# Patient Record
Sex: Female | Born: 1970 | Race: Black or African American | Hispanic: No | Smoking: Never smoker
Health system: Southern US, Community
[De-identification: ages and names within clinical notes are randomized; demographics above are authoritative.]

## PROBLEM LIST (undated history)

## (undated) DIAGNOSIS — I499 Cardiac arrhythmia, unspecified: Secondary | ICD-10-CM

## (undated) DIAGNOSIS — E876 Hypokalemia: Secondary | ICD-10-CM

## (undated) DIAGNOSIS — R6882 Decreased libido: Secondary | ICD-10-CM

## (undated) DIAGNOSIS — M722 Plantar fascial fibromatosis: Secondary | ICD-10-CM

## (undated) DIAGNOSIS — N62 Hypertrophy of breast: Secondary | ICD-10-CM

## (undated) DIAGNOSIS — I10 Essential (primary) hypertension: Secondary | ICD-10-CM

## (undated) DIAGNOSIS — I4891 Unspecified atrial fibrillation: Secondary | ICD-10-CM

## (undated) DIAGNOSIS — R7301 Impaired fasting glucose: Secondary | ICD-10-CM

## (undated) DIAGNOSIS — F419 Anxiety disorder, unspecified: Secondary | ICD-10-CM

## (undated) DIAGNOSIS — R946 Abnormal results of thyroid function studies: Secondary | ICD-10-CM

## (undated) HISTORY — PX: HAMMER TOE SURGERY: SHX385

## (undated) HISTORY — DX: Decreased libido: R68.82

## (undated) HISTORY — DX: Morbid (severe) obesity due to excess calories: E66.01

## (undated) HISTORY — DX: Hypertrophy of breast: N62

## (undated) HISTORY — DX: Abnormal results of thyroid function studies: R94.6

## (undated) HISTORY — DX: Plantar fascial fibromatosis: M72.2

## (undated) HISTORY — DX: Essential (primary) hypertension: I10

---

## 2004-10-01 ENCOUNTER — Emergency Department: Payer: Self-pay | Admitting: General Practice

## 2009-08-28 ENCOUNTER — Ambulatory Visit: Payer: Self-pay | Admitting: Podiatry

## 2009-09-01 ENCOUNTER — Ambulatory Visit: Payer: Self-pay | Admitting: Podiatry

## 2012-03-22 ENCOUNTER — Emergency Department: Payer: Self-pay | Admitting: Emergency Medicine

## 2013-12-16 HISTORY — PX: BREAST BIOPSY: SHX20

## 2014-02-21 ENCOUNTER — Ambulatory Visit: Payer: Self-pay | Admitting: Unknown Physician Specialty

## 2014-03-09 ENCOUNTER — Other Ambulatory Visit: Payer: Self-pay | Admitting: Family Medicine

## 2014-03-09 LAB — COMPREHENSIVE METABOLIC PANEL
Albumin: 3.5 g/dL (ref 3.4–5.0)
Alkaline Phosphatase: 77 U/L
Anion Gap: 4 — ABNORMAL LOW (ref 7–16)
BUN: 9 mg/dL (ref 7–18)
Bilirubin,Total: 0.3 mg/dL (ref 0.2–1.0)
CREATININE: 0.8 mg/dL (ref 0.60–1.30)
Calcium, Total: 8.3 mg/dL — ABNORMAL LOW (ref 8.5–10.1)
Chloride: 105 mmol/L (ref 98–107)
Co2: 28 mmol/L (ref 21–32)
EGFR (Non-African Amer.): >60
Glucose: 86 mg/dL (ref 65–99)
OSMOLALITY: 272 (ref 275–301)
POTASSIUM: 3.7 mmol/L (ref 3.5–5.1)
SGOT(AST): 33 U/L (ref 15–37)
SGPT (ALT): 26 U/L (ref 12–78)
Sodium: 137 mmol/L (ref 136–145)
Total Protein: 7.7 g/dL (ref 6.4–8.2)

## 2014-03-09 LAB — CBC WITH DIFFERENTIAL/PLATELET
BASOS PCT: 0.9 %
Basophil #: 0.1 10*3/uL (ref 0.0–0.1)
EOS ABS: 0.4 10*3/uL (ref 0.0–0.7)
EOS PCT: 4.6 %
HCT: 37.5 % (ref 35.0–47.0)
HGB: 12.7 g/dL (ref 12.0–16.0)
LYMPHS PCT: 43 %
Lymphocyte #: 3.3 10*3/uL (ref 1.0–3.6)
MCH: 32.6 pg (ref 26.0–34.0)
MCHC: 33.7 g/dL (ref 32.0–36.0)
MCV: 97 fL (ref 80–100)
Monocyte #: 0.5 x10 3/mm (ref 0.2–0.9)
Monocyte %: 5.8 %
Neutrophil #: 3.5 10*3/uL (ref 1.4–6.5)
Neutrophil %: 45.7 %
PLATELETS: 298 10*3/uL (ref 150–440)
RBC: 3.89 10*6/uL (ref 3.80–5.20)
RDW: 12.7 % (ref 11.5–14.5)
WBC: 7.7 10*3/uL (ref 3.6–11.0)

## 2014-03-09 LAB — HEMOGLOBIN A1C: Hemoglobin A1C: 5.7 % (ref 4.2–6.3)

## 2014-03-09 LAB — LIPID PANEL
Cholesterol: 165 mg/dL (ref 0–200)
HDL: 60 mg/dL (ref 40–60)
LDL CHOLESTEROL, CALC: 86 mg/dL (ref 0–100)
Triglycerides: 96 mg/dL (ref 0–200)
VLDL CHOLESTEROL, CALC: 19 mg/dL (ref 5–40)

## 2014-03-30 ENCOUNTER — Ambulatory Visit (INDEPENDENT_AMBULATORY_CARE_PROVIDER_SITE_OTHER): Payer: No Typology Code available for payment source | Admitting: Podiatry

## 2014-03-30 ENCOUNTER — Encounter: Payer: Self-pay | Admitting: Podiatry

## 2014-03-30 VITALS — BP 125/82 | HR 63 | Resp 16 | Ht 65.0 in | Wt 230.0 lb

## 2014-03-30 DIAGNOSIS — Q828 Other specified congenital malformations of skin: Secondary | ICD-10-CM

## 2014-03-30 NOTE — Progress Notes (Signed)
   Subjective:    Patient ID: Lori Oliver, female    DOB: 02/01/1971, 43 y.o.   MRN: [id number]  HPI Comments: SPOT ON THE BOTTOM OF MY RIGHT FOOT. ITS BEEN THERE A COUPLE OF MONTHS. TRIED USING A RAZOR ON IT TO DIG IT OUT     Review of Systems  All other systems reviewed and are negative.      Objective:   Physical Exam: I have reviewed her past medical history medications allergies surgeries social history and review of systems. Pulses are palpable bilateral. Neurologic sensorium is intact. Deep tendon reflexes are intact. Muscle strength was 5 over 5 dorsiflexors plantar flexors inverters everters all intrinsic musculature is intact. Orthopedic evaluation demonstrates mild pes planus bilateral. Mild hammertoe deformities bilateral. Cutaneous evaluation does demonstrates supple well hydrated cutis with exception of a solitary porokeratotic lesion sub-fifth metatarsal head of the right foot.        Assessment & Plan:  Assessment: Porokeratosis right foot.  Plan:

## 2014-08-30 ENCOUNTER — Ambulatory Visit: Payer: Self-pay | Admitting: Unknown Physician Specialty

## 2014-09-14 ENCOUNTER — Encounter: Payer: Self-pay | Admitting: General Surgery

## 2014-09-14 ENCOUNTER — Ambulatory Visit (INDEPENDENT_AMBULATORY_CARE_PROVIDER_SITE_OTHER): Payer: No Typology Code available for payment source | Admitting: General Surgery

## 2014-09-14 VITALS — BP 128/72 | HR 76 | Resp 12 | Ht 65.0 in | Wt 239.0 lb

## 2014-09-14 DIAGNOSIS — R928 Other abnormal and inconclusive findings on diagnostic imaging of breast: Secondary | ICD-10-CM | POA: Insufficient documentation

## 2014-09-14 NOTE — Patient Instructions (Signed)
Patient has been scheduled for a right breast stereotactic biopsy at [facility] for 09-26-14 at 2 pm. She will check-in at the [facility] at 1:30 pm. This patient is aware of date, time, and instructions. Patient verbalizes understanding.

## 2014-09-14 NOTE — Progress Notes (Signed)
Patient ID: Lori Oliver, female   DOB: [birth year], 43 y.o.   MRN: [id number]  Chief Complaint  Patient presents with  . Other    mammagram     HPI Lori Oliver is a 43 y.o. female who presents for a breast evaluation. The most recent mammogram was done on 08/30/14. Patient does perform regular self breast checks and gets regular mammograms done.  The patient denies any problems with the breasts at this time. No family history of breast cancer. The patient has had 1 prior mammogram done in her thirties for breast tenderness. By report, these films were completed at the hospital but have not been able to be located.  The patient had her first screening exam in spring of this year at which time a small nodular density was noted in the right breast. Focal spot compression views showed a 5 mm area with no corresponding ultrasound abnormality. This was felt to be a low suspicion lesion for malignancy and observation warranted. She recently completed her 6 month followup mammogram, with no interval change identified. She is asked extremely anxious about this watchful waiting, and presented to discuss elective biopsy.  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   HPI  Past Medical History  Diagnosis Date  . Hypertension     Past Surgical History  Procedure Laterality Date  . Hammer toe surgery      No family history on file.  Social History History  Substance Use Topics  . Smoking status: Never Smoker   . Smokeless tobacco: Never Used  . Alcohol Use: Yes    No Known Allergies  Current Outpatient Prescriptions  Medication Sig Dispense Refill  . hydrochlorothiazide (MICROZIDE) 12.5 MG capsule       . Norgestim-Eth Estrad Triphasic (ORTHO TRI-CYCLEN LO PO) Take by mouth.       No current facility-administered medications for this visit.    Review of Systems Review of Systems  Constitutional: Negative.   Respiratory: Negative.   Cardiovascular: Negative.     Blood  pressure 128/72, pulse 76, resp. rate 12, height 5\' 5"  (1.651 m), weight 239 lb (108.41 kg), last menstrual period 09/14/2014.  Physical Exam Physical Exam  Constitutional: She is oriented to person, place, and time. She appears well-developed and well-nourished.  Neck: Neck supple. No thyromegaly present.  Cardiovascular: Normal rate, regular rhythm and normal heart sounds.   No murmur heard. Pulmonary/Chest: Effort normal and breath sounds normal. Right breast exhibits no inverted nipple, no mass, no nipple discharge, no skin change and no tenderness. Left breast exhibits no inverted nipple, no mass, no nipple discharge, no skin change and no tenderness.  The patient has large pendulous breasts. No palpable abnormality.  Lymphadenopathy:    She has no cervical adenopathy.    She has no axillary adenopathy.  Neurological: She is alert and oriented to person, place, and time.  Skin: Skin is warm and dry.    Data Reviewed Original screening films of February 09, 2049 were reviewed. An inferior medial right breast lesion was appreciated. Small left breast lymph node noted.  Focal spot compression views of February 21, 2014 confirm the above findings. Ultrasound at that time was reported as negative.  Follow up films of August 30, 2014 were reviewed. No interval change. BI-RAD-3.  Assessment    5 mm nodule in a very large breasts.     Plan    The area is well defined and unchanged over 6 months. The patient finds "  watchful waiting" very stressful.  I reviewed with her that the area is supple, and a very large breast may be difficult to localize with stereotactic technique. If this is not possible, biopsy would require needle localization and open surgical intervention at a later date.  The risks associated with biopsy were reviewed. At this time she desires to proceed.  Patient has been scheduled for a right breast stereotactic biopsy at [facility] for 09-26-14 at 2 pm. She will check-in  at the [facility] at 1:30 pm. This patient is aware of date, time, and instructions. Patient verbalizes understanding.     PCP: Bobetta Lime Ref. MD: Dr. Percell Boston, Forest Gleason 09/14/2014, 8:33 PM

## 2014-09-26 ENCOUNTER — Ambulatory Visit: Payer: Self-pay | Admitting: General Surgery

## 2014-09-26 DIAGNOSIS — N6459 Other signs and symptoms in breast: Secondary | ICD-10-CM

## 2014-09-28 ENCOUNTER — Telehealth: Payer: Self-pay | Admitting: *Deleted

## 2014-09-28 ENCOUNTER — Encounter: Payer: Self-pay | Admitting: General Surgery

## 2014-09-28 LAB — PATHOLOGY REPORT

## 2014-09-28 NOTE — Telephone Encounter (Signed)
Notified patient as instructed, patient pleased. Discussed follow-up appointments, patient agrees. Placed in recalls.  

## 2014-09-28 NOTE — Telephone Encounter (Signed)
Message copied by Carson Myrtle on Wed Sep 28, 2014 10:06 AM ------      Message from: [place], Forest Gleason      Created: Wed Sep 28, 2014  8:33 AM       Please notify the patient that her biopsy results were entirely benign. She had a fibroadenoma, cluster of normal breast cells. Followup bilateral mammograms in 6 months with a brief office exam would be appropriate. Nursing follow up as previously scheduled. Thank you ------

## 2014-10-03 ENCOUNTER — Ambulatory Visit (INDEPENDENT_AMBULATORY_CARE_PROVIDER_SITE_OTHER): Payer: Self-pay | Admitting: *Deleted

## 2014-10-03 DIAGNOSIS — R928 Other abnormal and inconclusive findings on diagnostic imaging of breast: Secondary | ICD-10-CM

## 2014-10-03 NOTE — Progress Notes (Signed)
Patient here today for follow up post right breast biopsy.  .  The patient is aware that a heating pad may be used for comfort as needed.  Aware of pathology. Follow up as scheduled. Dr. Bary Castilla saw the the patient to go over the results and took a look at her right breast due to a hard spot that the patient noticed.

## 2014-10-17 ENCOUNTER — Encounter: Payer: Self-pay | Admitting: General Surgery

## 2014-10-31 ENCOUNTER — Encounter: Payer: Self-pay | Admitting: General Surgery

## 2014-11-21 ENCOUNTER — Ambulatory Visit (INDEPENDENT_AMBULATORY_CARE_PROVIDER_SITE_OTHER): Payer: No Typology Code available for payment source | Admitting: Podiatry

## 2014-11-21 VITALS — BP 141/92 | HR 74 | Resp 16

## 2014-11-21 DIAGNOSIS — B079 Viral wart, unspecified: Secondary | ICD-10-CM

## 2014-11-21 NOTE — Progress Notes (Signed)
She presents today with painful lesion plantar aspect of the right fifth metatarsal neck area. She states this is becoming increasingly more painful over the past several months.  Objective: Vital signs are stable she is alert and oriented 3. Upon evaluation today is noted that poor keratoma that I thought was present last time has definitely become a verruca plantaris.  Assessment: Verruca plantaris plantar aspect of the right forefoot.  Plan: We had performed a surgical curettage today after 2 mL of admission Marcaine plain lidocaine with epinephrine was injected sublesionally. The area was prepped and the lesion was surgically excised. She tolerated this procedure well and will follow up with me.

## 2014-11-21 NOTE — Patient Instructions (Signed)

## 2014-11-22 ENCOUNTER — Telehealth: Payer: Self-pay | Admitting: *Deleted

## 2014-11-22 NOTE — Telephone Encounter (Signed)
Pt called and wanted to know if she could exercise. Stated she had a wart taken off yesterday and her foot feels fine. Told pt as long as her foot felt ok, it was ok to exercise. Pt understood.

## 2014-11-28 ENCOUNTER — Encounter: Payer: Self-pay | Admitting: Podiatry

## 2014-12-07 ENCOUNTER — Ambulatory Visit (INDEPENDENT_AMBULATORY_CARE_PROVIDER_SITE_OTHER): Payer: No Typology Code available for payment source | Admitting: Podiatry

## 2014-12-07 ENCOUNTER — Encounter: Payer: Self-pay | Admitting: Podiatry

## 2014-12-07 DIAGNOSIS — Q828 Other specified congenital malformations of skin: Secondary | ICD-10-CM

## 2014-12-07 NOTE — Progress Notes (Signed)
She presents today for follow-up of her excision soft tissue lesion plantar aspect of the right foot. She states that it feels much better than previous but she has stopped soaking.  Objective: Vital signs are stable she is alert and oriented 3. Mild tenderness on palpation of the lesion to the plantar aspect of the fifth metatarsal head of the right foot that appears to be granulating in with epithelialization. There is small central hole that appears to have some serosanguineous drainage from it does not appear to be not nor does it have malodor. Pathology report states that this is a porokeratosis.  Assessment: Well-healing excision soft tissue lesion plantar aspect right foot.  Plan: Discontinue Betadine soaks start with Epsom salts and water soaks twice daily cover with a Band-Aid and the daily leave open at night. I will follow-up with her as needed.

## 2015-04-05 ENCOUNTER — Ambulatory Visit
Admit: 2015-04-05 | Disposition: A | Discharge status: Home / Self Care | Payer: Self-pay | Attending: General Surgery | Admitting: General Surgery

## 2015-04-10 ENCOUNTER — Ambulatory Visit: Payer: No Typology Code available for payment source | Admitting: General Surgery

## 2015-05-17 ENCOUNTER — Encounter: Payer: Self-pay | Admitting: *Deleted

## 2015-06-01 ENCOUNTER — Encounter (INDEPENDENT_AMBULATORY_CARE_PROVIDER_SITE_OTHER): Payer: Self-pay

## 2015-06-01 ENCOUNTER — Ambulatory Visit (INDEPENDENT_AMBULATORY_CARE_PROVIDER_SITE_OTHER): Payer: Managed Care, Other (non HMO) | Admitting: Family Medicine

## 2015-06-01 ENCOUNTER — Encounter: Payer: Self-pay | Admitting: Family Medicine

## 2015-06-01 VITALS — BP 130/88 | HR 91 | Temp 98.4°F | Resp 18 | Ht 65.0 in | Wt 237.2 lb

## 2015-06-01 DIAGNOSIS — M546 Pain in thoracic spine: Secondary | ICD-10-CM | POA: Insufficient documentation

## 2015-06-01 DIAGNOSIS — Z8742 Personal history of other diseases of the female genital tract: Secondary | ICD-10-CM | POA: Insufficient documentation

## 2015-06-01 DIAGNOSIS — E669 Obesity, unspecified: Secondary | ICD-10-CM | POA: Diagnosis not present

## 2015-06-01 DIAGNOSIS — R030 Elevated blood-pressure reading, without diagnosis of hypertension: Secondary | ICD-10-CM | POA: Insufficient documentation

## 2015-06-01 DIAGNOSIS — Z87898 Personal history of other specified conditions: Secondary | ICD-10-CM | POA: Insufficient documentation

## 2015-06-01 HISTORY — DX: Morbid (severe) obesity due to excess calories: E66.01

## 2015-06-01 MED ORDER — PHENTERMINE HCL 37.5 MG PO CAPS: 37.5000 mg | ORAL_CAPSULE | ORAL | Status: DC

## 2015-06-01 MED ORDER — PHENTERMINE HCL 37.5 MG PO CAPS: 37.5000 mg | ORAL_CAPSULE | Freq: Every day | ORAL | Status: DC

## 2015-06-01 NOTE — Progress Notes (Signed)
Name: Lori Oliver   MRN: [id number]    DOB: [birth year]   Date:06/01/2015       Progress Note  Subjective  Chief Complaint  Chief Complaint  Patient presents with  . Obesity    1 month follow up  . Results    review of lab results    HPI  Patient is here for routine follow up of Obesity. The patient is appropriately concerned about their weight. Onset of weight issues started several years ago. Highest recorded weight 242 lbs. Associated symptoms or diseases do not include pain in weight bearing joints other than lumbar spine, HTN, HLD, DMII, skin lesions, depression, poor self esteem, snoring, sleep apnea. Current efforts to reduce weight include diet exercise and Adipex. Describe current exercise regimen: daily, walking 3-42miles Describe current diet regimen: balanced diet     Patient Active Problem List   Diagnosis Date Noted  . Obesity, Class II, BMI 35-39.9 06/01/2015  . Bilateral thoracic back pain 06/01/2015  . Blood pressure elevated without history of HTN 06/01/2015  . History of breast lump 06/01/2015  . History of abnormal cervical Pap smear 06/01/2015  . Abnormal mammogram 09/14/2014    History  Substance Use Topics  . Smoking status: Never Smoker   . Smokeless tobacco: Never Used  . Alcohol Use: No     Current outpatient prescriptions:  .  Norgestim-Eth Estrad Triphasic (ORTHO TRI-CYCLEN LO PO), Take by mouth., Disp: , Rfl:  .  phentermine 37.5 MG capsule, Take 1 capsule by mouth daily., Disp: , Rfl:   Past Surgical History  Procedure Laterality Date  . Hammer toe surgery      Family History  Problem Relation Age of Onset  . Hypertension Mother   . Heart disease Mother   . Arthritis Mother   . Depression Mother   . Stroke Maternal Grandmother     No Known Allergies   Review of Systems  Ten systems reviewed and is negative except as mentioned in HPI.    Objective  BP 130/88 mmHg  Pulse 91  Temp(Src) 98.4 F (36.9 C) (Oral)  Resp  18  Ht 5\' 5"  (1.651 m)  Wt 237 lb 3.2 oz (107.593 kg)  BMI 39.47 kg/m2  SpO2 96%  LMP 05/24/2015  Physical Exam  Constitutional: Patient is obese and well-nourished. In no distress.  HEENT:  - Head: Normocephalic and atraumatic.  - Ears: Bilateral TMs gray, no erythema or effusion - Nose: Nasal mucosa moist - Mouth/Throat: Oropharynx is clear and moist. No tonsillar hypertrophy or erythema. No post nasal drainage.  - Eyes: Conjunctivae clear, EOM movements normal. PERRLA. No scleral icterus.  Neck: Normal range of motion. Neck supple. No JVD present. No thyromegaly present.  Cardiovascular: Normal rate, regular rhythm and normal heart sounds.  No murmur heard.  Pulmonary/Chest: Effort normal and breath sounds normal. No respiratory distress. Musculoskeletal: Normal range of motion bilateral UE and LE, no joint effusions. Peripheral vascular: Bilateral LE no edema. Neurological: CN II-XII grossly intact with no focal deficits. Alert and oriented to person, place, and time. Coordination, balance, strength, speech and gait are normal.  Skin: Skin is warm and dry. No rash noted. No erythema.  Psychiatric: Patient has a normal mood and affect. Behavior is normal in office today. Judgment and thought content normal in office today.   Assessment & Plan  1. Obesity, Class II, BMI 35-39.9 Reviewed lab work with patient today, no significant findings (ordered in [employer] EMR in 04/2014 and results were  faxed, hard copy will be scanned into [employer] EMR).  The patient has been counseled on their higher than normal BMI.  They have verbally expressed understanding their increased risk for other diseases.  In efforts to meet a better target BMI goal the patient has been counseled on lifestyle, diet and exercise modification tactics. Start with moderate intensity aerobic exercise (walking, jogging, elliptical, swimming, group or individual sports, hiking) at least 17mins a day at least 4 days a week and  increase intensity, duration, frequency as tolerated. Diet should include well balance fresh fruits and vegetables avoiding processed foods, carbohydrates and sugars. Drink at least 8oz 10 glasses a day avoiding sodas, sugary fruit drinks, sweetened tea. Check weight on a reliable scale daily and monitor weight loss progress daily. Consider investing in mobile phone apps that will help keep track of weight loss goals.  - phentermine 37.5 MG capsule; Take 1 capsule (37.5 mg total) by mouth daily.  Dispense: 30 capsule; Refill: 0 - phentermine 37.5 MG capsule; Take 1 capsule (37.5 mg total) by mouth every morning.  Dispense: 30 capsule; Refill: 0 - phentermine 37.5 MG capsule; Take 1 capsule (37.5 mg total) by mouth daily.  Dispense: 30 capsule; Refill: 0

## 2015-06-01 NOTE — Patient Instructions (Signed)
Exercise to Lose Weight Exercise and a healthy diet may help you lose weight. Your doctor may suggest specific exercises. EXERCISE IDEAS AND TIPS  Choose low-cost things you enjoy doing, such as walking, bicycling, or exercising to workout videos.  Take stairs instead of the elevator.  Walk during your lunch break.  Park your car further away from work or school.  Go to a gym or an exercise class.  Start with 5 to 10 minutes of exercise each day. Build up to 30 minutes of exercise 4 to 6 days a week.  Wear shoes with good support and comfortable clothes.  Stretch before and after working out.  Work out until you breathe harder and your heart beats faster.  Drink extra water when you exercise.  Do not do so much that you hurt yourself, feel dizzy, or get very short of breath. Exercises that burn about 150 calories:  Running 1  miles in 15 minutes.  Playing volleyball for 45 to 60 minutes.  Washing and waxing a car for 45 to 60 minutes.  Playing touch football for 45 minutes.  Walking 1  miles in 35 minutes.  Pushing a stroller 1  miles in 30 minutes.  Playing basketball for 30 minutes.  Raking leaves for 30 minutes.  Bicycling 5 miles in 30 minutes.  Walking 2 miles in 30 minutes.  Dancing for 30 minutes.  Shoveling snow for 15 minutes.  Swimming laps for 20 minutes.  Walking up stairs for 15 minutes.  Bicycling 4 miles in 15 minutes.  Gardening for 30 to 45 minutes.  Jumping rope for 15 minutes.  Washing windows or floors for 45 to 60 minutes. Document Released: 01/04/2011 Document Revised: 02/24/2012 Document Reviewed: 01/04/2011 Exit[employer] Patient Information 2015 [employer]. This information is not intended to replace advice given to you by your health care provider. Make sure you discuss any questions you have with your health care provider.  

## 2015-06-28 ENCOUNTER — Encounter: Payer: Self-pay | Admitting: Family Medicine

## 2015-09-01 ENCOUNTER — Ambulatory Visit: Payer: Managed Care, Other (non HMO) | Admitting: Family Medicine

## 2016-02-05 ENCOUNTER — Ambulatory Visit: Payer: Managed Care, Other (non HMO) | Admitting: Family Medicine

## 2016-02-05 ENCOUNTER — Other Ambulatory Visit: Payer: Self-pay

## 2016-02-05 MED ORDER — NORGESTIM-ETH ESTRAD TRIPHASIC 0.18/0.215/0.25 MG-25 MCG PO TABS: 1.0000 | ORAL_TABLET | Freq: Every day | ORAL | Status: DC

## 2016-02-05 NOTE — Telephone Encounter (Signed)
Had an appointment for today but had to reschedule

## 2016-02-12 ENCOUNTER — Ambulatory Visit (INDEPENDENT_AMBULATORY_CARE_PROVIDER_SITE_OTHER): Payer: Managed Care, Other (non HMO) | Admitting: Family Medicine

## 2016-02-12 ENCOUNTER — Encounter: Payer: Self-pay | Admitting: Family Medicine

## 2016-02-12 VITALS — BP 138/80 | HR 81 | Temp 98.2°F | Resp 20 | Ht 65.0 in | Wt 238.4 lb

## 2016-02-12 DIAGNOSIS — Z3041 Encounter for surveillance of contraceptive pills: Secondary | ICD-10-CM | POA: Diagnosis not present

## 2016-02-12 DIAGNOSIS — E669 Obesity, unspecified: Secondary | ICD-10-CM | POA: Diagnosis not present

## 2016-02-12 MED ORDER — CONTRAVE 8-90 MG PO TB12: 2.0000 | ORAL_TABLET | Freq: Two times a day (BID) | ORAL | Status: DC

## 2016-02-12 NOTE — Progress Notes (Signed)
Name: Lori Oliver   MRN: [id number]    DOB: 1971-04-24   Date:02/12/2016       Progress Note  Subjective  Chief Complaint  Chief Complaint  Patient presents with  . Medication Refill    contraceptive   . Obesity    HPI  Lori Oliver is a 45 year old female here for refills of OCPs and discuss her obesity.   She tried Adipex in 2016 and initially she had good apetite suppression and weight loss with exercise and lifestyle changes. But then she started working 2 jobs and now she has no time to exercise and eat right so she has put on the weight again.  The patient is appropriately concerned about their weight. Onset of weight issues started several years ago. Highest recorded weight 242 lbs. Today 238lbs. Associated symptoms or diseases do not include pain in weight bearing joints other than lumbar spine, HTN, HLD, DMII, skin lesions, snoring, sleep apnea. She does report low moods and poor self esteem since regaining her weight. Current efforts to reduce weight include none.    She did consider bariatric surgery but did not want complications or nutritional deficits.   Past Medical History  Diagnosis Date  . Hypertension     Patient Active Problem List   Diagnosis Date Noted  . Obesity, Class II, BMI 35-39.9 06/01/2015  . Bilateral thoracic back pain 06/01/2015  . Blood pressure elevated without history of HTN 06/01/2015  . History of breast lump 06/01/2015  . History of abnormal cervical Pap smear 06/01/2015    Social History  Substance Use Topics  . Smoking status: Never Smoker   . Smokeless tobacco: Never Used  . Alcohol Use: No     Current outpatient prescriptions:  .  Norgestimate-Ethinyl Estradiol Triphasic (ORTHO TRI-CYCLEN LO) 0.18/0.215/0.25 MG-25 MCG tab, Take 1 tablet by mouth daily., Disp: 1 Package, Rfl: 5 .  phentermine 37.5 MG capsule, Take 1 capsule (37.5 mg total) by mouth every morning., Disp: 30 capsule, Rfl: 0 .  phentermine 37.5 MG capsule,  Take 1 capsule (37.5 mg total) by mouth daily., Disp: 30 capsule, Rfl: 0 .  SPRINTEC 28 0.25-35 MG-MCG tablet, , Disp: , Rfl:   Past Surgical History  Procedure Laterality Date  . Hammer toe surgery      Family History  Problem Relation Age of Onset  . Hypertension Mother   . Heart disease Mother   . Arthritis Mother   . Depression Mother   . Stroke Maternal Grandmother     No Known Allergies   Review of Systems  CONSTITUTIONAL: Increased weight gain. No fever, chills, weakness or fatigue.  CARDIOVASCULAR: No chest pain, chest pressure or chest discomfort. No palpitations or edema.  RESPIRATORY: No shortness of breath, cough or sputum.  NEUROLOGICAL: No headache, dizziness, syncope, paralysis, ataxia, numbness or tingling in the extremities. No memory changes. No change in bowel or bladder control.  MUSCULOSKELETAL: No joint pain. No muscle pain. HEMATOLOGIC: No anemia, bleeding or bruising.  LYMPHATICS: No enlarged lymph nodes.  PSYCHIATRIC: No change in mood. No change in sleep pattern.  ENDOCRINOLOGIC: No reports of sweating, cold or heat intolerance. No polyuria or polydipsia.     Objective  BP 138/80 mmHg  Pulse 81  Temp(Src) 98.2 F (36.8 C)  Resp 20  Ht 5\' 5"  (1.651 m)  Wt 238 lb 7 oz (108.155 kg)  BMI 39.68 kg/m2  SpO2 97% Body mass index is 39.68 kg/(m^2).  Physical Exam  Constitutional: Patient  is obese and well-nourished. In no distress.  Neck: Normal range of motion. Neck supple. No JVD present. No thyromegaly present.  Cardiovascular: Normal rate, regular rhythm and normal heart sounds.  No murmur heard.  Pulmonary/Chest: Effort normal and breath sounds normal. No respiratory distress. Musculoskeletal: Normal range of motion bilateral UE and LE, no joint effusions. Peripheral vascular: Bilateral LE no edema. Neurological: CN II-XII grossly intact with no focal deficits. Alert and oriented to person, place, and time. Coordination, balance, strength,  speech and gait are normal.  Skin: Skin is warm and dry. No rash noted. No erythema.  Psychiatric: Patient has a stable mood and affect. Behavior is normal in office today. Judgment and thought content normal in office today.   Assessment & Plan  1. Surveillance for birth control, oral contraceptives Refilled earlier this month  2. Obesity, Class II, BMI 35-39.9 The patient has been counseled on their higher than normal BMI.  They have verbally expressed understanding their increased risk for other diseases.  In efforts to meet a better target BMI goal the patient has been counseled on lifestyle, diet and exercise modification tactics. Start with moderate intensity aerobic exercise (walking, jogging, elliptical, swimming, group or individual sports, hiking) at least 3mins a day at least 4 days a week and increase intensity, duration, frequency as tolerated. Diet should include well balance fresh fruits and vegetables avoiding processed foods, carbohydrates and sugars. Drink at least 8oz 10 glasses a day avoiding sodas, sugary fruit drinks, sweetened tea. Check weight on a reliable scale daily and monitor weight loss progress daily. Consider investing in mobile phone apps that will help keep track of weight loss goals.  - CONTRAVE 8-90 MG TB12; Take 2 tablets by mouth 2 (two) times daily.  Dispense: 60 tablet; Refill: 2

## 2016-02-13 ENCOUNTER — Telehealth: Payer: Self-pay | Admitting: Family Medicine

## 2016-02-13 NOTE — Telephone Encounter (Signed)
Called patient told her to contact her pharmacy or insurance company about cheaper weight loss medicine.

## 2016-02-13 NOTE — Telephone Encounter (Signed)
Patient states Contrave will cost $118.00 (to expensive), would like to know if there is something different that you could prescribe that is less expensive. I asked her to call insurance company to find out what they will cover and patient stated that they told her to contact her primary doctor.

## 2016-02-20 ENCOUNTER — Telehealth: Payer: Self-pay

## 2016-02-20 NOTE — Telephone Encounter (Signed)
Unfortunately she did not lose enough weight on Phentermine and chronic use of this medication on and off is not recommend side effects outweigh benefits.

## 2016-02-20 NOTE — Telephone Encounter (Signed)
Patient called states  Her ins. Will not cover Contrave and is to expensive.  Wants to see if you will put her back on phentermine?

## 2016-04-01 ENCOUNTER — Other Ambulatory Visit: Payer: Self-pay

## 2016-04-01 NOTE — Telephone Encounter (Signed)
I have never seen this patient; chart reviewed Please ask patient to contact her gynecologist for this; I see hx of abnormal pap smear and I do not see the pathology report (I looked pretty extensively in media and lab tabs, but maybe I missed it) Also, she is over 40 and has phentermine listed in her med list, so I'm not comfortable prescribing this for her Please have her contact gyn for birth control medicine or other options

## 2016-04-02 MED ORDER — SPRINTEC 28 0.25-35 MG-MCG PO TABS: 1.0000 | ORAL_TABLET | Freq: Every day | ORAL | Status: DC

## 2016-04-02 MED ORDER — NORGESTIM-ETH ESTRAD TRIPHASIC 0.18/0.215/0.25 MG-25 MCG PO TABS
1.0000 | ORAL_TABLET | Freq: Every day | ORAL | Status: DC
Start: 2016-04-02 — End: 2016-04-02

## 2016-04-02 NOTE — Telephone Encounter (Signed)
Patient states she has a physical here on May 18th and states that Dr. Rod Holler does her paps and gives her birth control. Patient states that she is out of birth control right now.

## 2016-04-02 NOTE — Telephone Encounter (Signed)
Dr. lada talked with patient and is refilling sprintec.  Will see her in May for CPE

## 2016-04-02 NOTE — Telephone Encounter (Signed)
I talked with patient Apologized for any confusion, but we had no record of pap smear done here ever; staff found an abnormal pap smear from February 25, 2013 that showed NIL but HPV positive Patient avers that she had another one since then and that it was normal She denies being on any diet pills right now; she does not smoke; I reminded her of low but possible risk of heart attack or stroke on OCP; consider other options; we'll discuss at CPE in May Two months of Rx approved to last until I see her I sent Rx; saw that she had two OCPs in med list; staff called to clarify correct one and I sent that 2nd and marked for 1st OCP to be cancelled --------------------- Lori Oliver -- we have the pap smear from February 25, 2013, but we need the more recent pap smear for her chart; thank you

## 2016-05-02 ENCOUNTER — Telehealth: Payer: Self-pay | Admitting: Family Medicine

## 2016-05-02 NOTE — Telephone Encounter (Signed)
PT HAS A PHYSICAL APPT WITH YOU ON May 16 2016. tHIS IS THE FIRST AVAILABLE CPE SLOT.  PT IS NEEDING HER BIRTHCONTROL REFILLED.  [place]

## 2016-05-02 NOTE — Telephone Encounter (Signed)
I see abnormal pap smear in her hx; I do not see her last pap report (path); can you please find that first? Also, I need to know if she's taking any diet pills like phentermine; I see that in her past med list

## 2016-05-03 NOTE — Telephone Encounter (Signed)
I tried to call patient back and she was busy, she states would call back

## 2016-05-10 NOTE — Telephone Encounter (Signed)
ERRENOUS °

## 2016-05-16 ENCOUNTER — Encounter: Payer: Self-pay | Admitting: Family Medicine

## 2016-05-16 ENCOUNTER — Telehealth: Payer: Self-pay

## 2016-05-16 ENCOUNTER — Ambulatory Visit (INDEPENDENT_AMBULATORY_CARE_PROVIDER_SITE_OTHER): Payer: Managed Care, Other (non HMO) | Admitting: Family Medicine

## 2016-05-16 VITALS — BP 132/84 | HR 85 | Temp 97.5°F | Resp 16 | Wt 242.0 lb

## 2016-05-16 DIAGNOSIS — Z1239 Encounter for other screening for malignant neoplasm of breast: Secondary | ICD-10-CM | POA: Insufficient documentation

## 2016-05-16 DIAGNOSIS — R946 Abnormal results of thyroid function studies: Secondary | ICD-10-CM

## 2016-05-16 DIAGNOSIS — Z309 Encounter for contraceptive management, unspecified: Secondary | ICD-10-CM | POA: Insufficient documentation

## 2016-05-16 DIAGNOSIS — Z8742 Personal history of other diseases of the female genital tract: Secondary | ICD-10-CM | POA: Diagnosis not present

## 2016-05-16 DIAGNOSIS — R6882 Decreased libido: Secondary | ICD-10-CM

## 2016-05-16 DIAGNOSIS — Z124 Encounter for screening for malignant neoplasm of cervix: Secondary | ICD-10-CM | POA: Diagnosis not present

## 2016-05-16 DIAGNOSIS — N62 Hypertrophy of breast: Secondary | ICD-10-CM | POA: Diagnosis not present

## 2016-05-16 DIAGNOSIS — R5383 Other fatigue: Secondary | ICD-10-CM

## 2016-05-16 DIAGNOSIS — Z0000 Encounter for general adult medical examination without abnormal findings: Secondary | ICD-10-CM | POA: Diagnosis not present

## 2016-05-16 DIAGNOSIS — N63 Unspecified lump in breast: Secondary | ICD-10-CM | POA: Diagnosis not present

## 2016-05-16 DIAGNOSIS — R7301 Impaired fasting glucose: Secondary | ICD-10-CM | POA: Diagnosis not present

## 2016-05-16 DIAGNOSIS — Z3041 Encounter for surveillance of contraceptive pills: Secondary | ICD-10-CM | POA: Diagnosis not present

## 2016-05-16 DIAGNOSIS — N6323 Unspecified lump in the left breast, lower outer quadrant: Secondary | ICD-10-CM

## 2016-05-16 HISTORY — DX: Decreased libido: R68.82

## 2016-05-16 HISTORY — DX: Hypertrophy of breast: N62

## 2016-05-16 HISTORY — DX: Abnormal results of thyroid function studies: R94.6

## 2016-05-16 NOTE — Telephone Encounter (Signed)
Thank you :)

## 2016-05-16 NOTE — Telephone Encounter (Signed)
Pt states yall talked about birth control.  She wants to get the birth control in the arm please make referral to [facility]: Su Hilt

## 2016-05-16 NOTE — Telephone Encounter (Signed)
Also place order for diagnostic mammo and left and right u/s

## 2016-05-16 NOTE — Patient Instructions (Addendum)
Please do call to schedule your mammogram; the number to schedule one at either [facility] or [facility] is [phone]  I've put in a referral to see a surgeon  Return for fasting labs to [place] out the information at [email or web address] entitled "Nutrition for Weight Loss: What You Need to Know about Fad Diets" Try to lose between 1-2 pounds per week by taking in fewer calories and burning off more calories You can succeed by limiting portions, limiting foods dense in calories and fat, becoming more active, and drinking 8 glasses of water a day (64 ounces) Don't skip meals, especially breakfast, as skipping meals may alter your metabolism Do not use over-the-counter weight loss pills or gimmicks that claim rapid weight loss A healthy BMI (or body mass index) is between 18.5 and 24.9 You can calculate your ideal BMI at the [place] website ClubMonetize.fr  Return for dedicated weight loss medicine vs surgery visit if desired  Bariatric Surgery Information Bariatric surgery, also called weight loss surgery, is a procedure that helps you lose weight. You may consider or your health care provider may suggest bariatric surgery if:  You are severely obese and have been unable to lose weight through diet and exercise.  You have health problems related to obesity, such as:  Type 2 diabetes.  Heart disease.  Lung disease. HOW DOES BARIATRIC SURGERY HELP ME LOSE WEIGHT?  Bariatric surgery helps you lose weight by decreasing how much food your body absorbs. This is done by closing off part of your stomach to make it smaller. This restricts the amount of food your stomach can hold. Bariatric surgery can also change your body's regular digestive process, so that food bypasses the parts of your body that absorb calories and nutrients.  If you decide to have bariatric surgery, it is important to continue to eat a  healthy diet and exercise regularly after the surgery.  WHAT ARE THE DIFFERENT KINDS OF BARIATRIC SURGERY?  There are two kinds of bariatric surgeries:  Restrictive surgeries make your stomach smaller. They do not change your digestive process. The smaller the size of your new stomach, the less food you can eat. There are different types of restrictive surgeries.  Malabsorptive surgeries both make your stomach smaller and alter your digestive process so that your body processes less calories and nutrients. These are the most common kind of bariatric surgery. There are different types of malabsorptive surgeries. WHAT ARE THE DIFFERENT TYPES OF RESTRICTIVE SURGERY? Adjustable Gastric Banding In this procedure, an inflatable band is placed around your stomach near the upper end. This makes the passageway for food into the rest of your stomach much smaller. The band can be adjusted, making it tighter or looser, by filling it with salt solution. Your surgeon can adjust the band based on how are you feeling and how much weight you are losing. The band can be removed in the future.  Vertical Banded Gastroplasty In this procedure, staples are used to separate your stomach into two parts, a small upper pouch and a bigger lower pouch. This decreases how much food you can eat. Sleeve Gastrectomy In this procedure, your stomach is made smaller. This is done by surgically removing a large part of your stomach. When your stomach is smaller, you feel full more quickly and reduce how much you eat. WHAT ARE THE DIFFERENT TYPES OF MALABSORPTIVE SURGERY? Roux-en-Y Gastric Bypass (RGB) This is the most common weight loss surgery. In this procedure, a small  stomach pouch is created in the upper part of your stomach. Next, this small stomach pouch is attached directly to the middle part of your small intestine. The farther down your small intestine the new connection is made, the fewer calories and nutrients you will  absorb.  Biliopancreatic Diversion with Duodenal Switch (BPD/DS)  This is a multi-step procedure. In this procedure, a large part of your stomach is removed, making your stomach smaller. Next, this smaller stomach is attached to the lower part of your small intestine. Like the RGB surgery, you absorb fewer calories and nutrients the farther down your small intestine the attachment is made.   WHAT ARE THE RISKS OF BARIATRIC SURGERY? As with any surgical procedure, each type of bariatric surgery has its own risks. These risks also depend on your age, your overall health, and any other medical conditions you may have. When deciding on bariatric surgery, it is very important to:   Talk to your health care provider and choose the surgery that is best for you.  Ask your health care provider about specific risks for the surgery you choose. FOR MORE INFORMATION  American Society for Metabolic & Bariatric Surgery: www.asmbs.org  Weight-control Information Network (WIN): win.AmenCredit.is   This information is not intended to replace advice given to you by your health care provider. Make sure you discuss any questions you have with your health care provider.   Document Released: 12/02/2005 Document Revised: 08/23/2015 Document Reviewed: 06/02/2013 Elsevier Interactive Patient Education 2016 Lori Oliver, Lori Oliver Adopting a healthy lifestyle and getting preventive care can go a long way to promote health and wellness. Talk with your health care provider about what schedule of regular examinations is right for you. This is a good chance for you to check in with your provider about disease prevention and staying healthy. In between checkups, there are plenty of things you can do on your own. Experts have done a lot of research about which lifestyle changes and preventive measures are most likely to keep you healthy. Ask your health care provider for more information. WEIGHT AND DIET   Eat a healthy diet  Be sure to include plenty of vegetables, fruits, low-fat dairy products, and lean protein.  Do not eat a lot of foods high in solid fats, added sugars, or salt.  Get regular exercise. This is one of the most important things you can do for your health.  Most adults should exercise for at least 150 minutes each week. The exercise should increase your heart rate and make you sweat (moderate-intensity exercise).  Most adults should also do strengthening exercises at least twice a week. This is in addition to the moderate-intensity exercise.  Maintain a healthy weight  Body mass index (BMI) is a measurement that can be used to identify possible weight problems. It estimates body fat based on height and weight. Your health care provider can help determine your BMI and help you achieve or maintain a healthy weight.  For females 2 years of age and older:   A BMI below 18.5 is considered underweight.  A BMI of 18.5 to 24.9 is normal.  A BMI of 25 to 29.9 is considered overweight.  A BMI of 30 and above is considered obese.  Watch levels of cholesterol and blood lipids  You should start having your blood tested for lipids and cholesterol at 44 years of age, then have this test every 5 years.  You may need to have your cholesterol levels checked more  often if:  Your lipid or cholesterol levels are high.  You are older than 44 years of age.  You are at high risk for heart disease.  CANCER SCREENING   Lung Cancer  Lung cancer screening is recommended for adults 34-54 years old who are at high risk for lung cancer because of a history of smoking.  A yearly low-dose CT scan of the lungs is recommended for people who:  Currently smoke.  Have quit within the past 15 years.  Have at least a 30-pack-year history of smoking. A pack year is smoking an average of one pack of cigarettes a day for 1 year.  Yearly screening should continue until it has been 15  years since you quit.  Yearly screening should stop if you develop a health problem that would prevent you from having lung cancer treatment.  Breast Cancer  Practice breast self-awareness. This means understanding how your breasts normally appear and feel.  It also means doing regular breast self-exams. Let your health care provider know about any changes, no matter how small.  If you are in your 20s or 30s, you should have a clinical breast exam (CBE) by a health care provider every 1-3 years as part of a regular health exam.  If you are 25 or older, have a CBE every year. Also consider having a breast X-ray (mammogram) every year.  If you have a family history of breast cancer, talk to your health care provider about genetic screening.  If you are at high risk for breast cancer, talk to your health care provider about having an MRI and a mammogram every year.  Breast cancer gene (BRCA) assessment is recommended for women who have family members with BRCA-related cancers. BRCA-related cancers include:  Breast.  Ovarian.  Tubal.  Peritoneal cancers.  Results of the assessment will determine the need for genetic counseling and BRCA1 and BRCA2 testing. Cervical Cancer Your health care provider may recommend that you be screened regularly for cancer of the pelvic organs (ovaries, uterus, and vagina). This screening involves a pelvic examination, including checking for microscopic changes to the surface of your cervix (Pap test). You may be encouraged to have this screening done every 3 years, beginning at age 33.  For women ages 22-65, health care providers may recommend pelvic exams and Pap testing every 3 years, or they may recommend the Pap and pelvic exam, combined with testing for human papilloma virus (HPV), every 5 years. Some types of HPV increase your risk of cervical cancer. Testing for HPV may also be done on women of any age with unclear Pap test results.  Other health  care providers may not recommend any screening for nonpregnant women who are considered low risk for pelvic cancer and who do not have symptoms. Ask your health care provider if a screening pelvic exam is right for you.  If you have had past treatment for cervical cancer or a condition that could lead to cancer, you need Pap tests and screening for cancer for at least 20 years after your treatment. If Pap tests have been discontinued, your risk factors (such as having a new sexual partner) need to be reassessed to determine if screening should resume. Some women have medical problems that increase the chance of getting cervical cancer. In these cases, your health care provider may recommend more frequent screening and Pap tests. Colorectal Cancer  This type of cancer can be detected and often prevented.  Routine colorectal cancer screening usually begins  at 44 years of age and continues through 44 years of age.  Your health care provider may recommend screening at an earlier age if you have risk factors for colon cancer.  Your health care provider may also recommend using home test kits to check for hidden blood in the stool.  A small camera at the end of a tube can be used to examine your colon directly (sigmoidoscopy or colonoscopy). This is done to check for the earliest forms of colorectal cancer.  Routine screening usually begins at age 80.  Direct examination of the colon should be repeated every 5-10 years through 44 years of age. However, you may need to be screened more often if early forms of precancerous polyps or small growths are found. Skin Cancer  Check your skin from head to toe regularly.  Tell your health care provider about any new moles or changes in moles, especially if there is a change in a mole's shape or color.  Also tell your health care provider if you have a mole that is larger than the size of a pencil eraser.  Always use sunscreen. Apply sunscreen liberally and  repeatedly throughout the day.  Protect yourself by wearing long sleeves, pants, a wide-brimmed hat, and sunglasses whenever you are outside. HEART DISEASE, DIABETES, AND HIGH BLOOD PRESSURE   High blood pressure causes heart disease and increases the risk of stroke. High blood pressure is more likely to develop in:  People who have blood pressure in the high end of the normal range (130-139/85-89 mm Hg).  People who are overweight or obese.  People who are African American.  If you are 1-20 years of age, have your blood pressure checked every 3-5 years. If you are 48 years of age or older, have your blood pressure checked every year. You should have your blood pressure measured twice--once when you are at a hospital or clinic, and once when you are not at a hospital or clinic. Record the average of the two measurements. To check your blood pressure when you are not at a hospital or clinic, you can use:  An automated blood pressure machine at a pharmacy.  A home blood pressure monitor.  If you are between 52 years and 68 years old, ask your health care provider if you should take aspirin to prevent strokes.  Have regular diabetes screenings. This involves taking a blood sample to check your fasting blood sugar level.  If you are at a normal weight and have a low risk for diabetes, have this test once every three years after 44 years of age.  If you are overweight and have a high risk for diabetes, consider being tested at a younger age or more often. PREVENTING INFECTION  Hepatitis B  If you have a higher risk for hepatitis B, you should be screened for this virus. You are considered at high risk for hepatitis B if:  You were born in a country where hepatitis B is common. Ask your health care provider which countries are considered high risk.  Your parents were born in a high-risk country, and you have not been immunized against hepatitis B (hepatitis B vaccine).  You have HIV or  AIDS.  You use needles to inject street drugs.  You live with someone who has hepatitis B.  You have had sex with someone who has hepatitis B.  You get hemodialysis treatment.  You take certain medicines for conditions, including cancer, organ transplantation, and autoimmune conditions. Hepatitis C  Blood testing is recommended for:  Everyone born from 1945 through 1965.  Anyone with known risk factors for hepatitis C. Sexually transmitted infections (STIs)  You should be screened for sexually transmitted infections (STIs) including gonorrhea and chlamydia if:  You are sexually active and are younger than 44 years of age.  You are older than 44 years of age and your health care provider tells you that you are at risk for this type of infection.  Your sexual activity has changed since you were last screened and you are at an increased risk for chlamydia or gonorrhea. Ask your health care provider if you are at risk.  If you do not have HIV, but are at risk, it may be recommended that you take a prescription medicine daily to prevent HIV infection. This is called pre-exposure prophylaxis (PrEP). You are considered at risk if:  You are sexually active and do not regularly use condoms or know the HIV status of your partner(s).  You take drugs by injection.  You are sexually active with a partner who has HIV. Talk with your health care provider about whether you are at high risk of being infected with HIV. If you choose to begin PrEP, you should first be tested for HIV. You should then be tested every 3 months for as long as you are taking PrEP.  PREGNANCY   If you are premenopausal and you may become pregnant, ask your health care provider about preconception counseling.  If you may become pregnant, take 400 to 800 micrograms (mcg) of folic acid every day.  If you want to prevent pregnancy, talk to your health care provider about birth control (contraception). OSTEOPOROSIS AND  MENOPAUSE   Osteoporosis is a disease in which the bones lose minerals and strength with aging. This can result in serious bone fractures. Your risk for osteoporosis can be identified using a bone density scan.  If you are 65 years of age or older, or if you are at risk for osteoporosis and fractures, ask your health care provider if you should be screened.  Ask your health care provider whether you should take a calcium or vitamin D supplement to lower your risk for osteoporosis.  Menopause may have certain physical symptoms and risks.  Hormone replacement therapy may reduce some of these symptoms and risks. Talk to your health care provider about whether hormone replacement therapy is right for you.  HOME CARE INSTRUCTIONS   Schedule regular health, dental, and eye exams.  Stay current with your immunizations.   Do not use any tobacco products including cigarettes, chewing tobacco, or electronic cigarettes.  If you are pregnant, do not drink alcohol.  If you are breastfeeding, limit how much and how often you drink alcohol.  Limit alcohol intake to no more than 1 drink per day for nonpregnant women. One drink equals 12 ounces of beer, 5 ounces of wine, or 1 ounces of hard liquor.  Do not use street drugs.  Do not share needles.  Ask your health care provider for help if you need support or information about quitting drugs.  Tell your health care provider if you often feel depressed.  Tell your health care provider if you have ever been abused or do not feel safe at home.   This information is not intended to replace advice given to you by your health care provider. Make sure you discuss any questions you have with your health care provider.   Document Released: 06/17/2011 Document Revised: 12/23/2014   Document Reviewed: 11/03/2013 Elsevier Interactive Patient Education 2016 [employer] Inc.  

## 2016-05-16 NOTE — Progress Notes (Signed)
Patient ID: Lori Oliver, female   DOB: 04-01-1971, 45 y.o.   MRN: [id number]   Subjective:   Lori Oliver is a 45 y.o. female here for a complete physical exam  She is thinking about a different type of birth control She does not want to have any more children  Gaining weight, constantly over time Breasts are heavy, has pain in her back; interested in breast reduction; takes tylelnol; bras are expensive; grooves in the shoulder Took weight loss pill for a few months Spotted in between period occasionally; forgets her birth control pills occasionally; no spotting after intercourse  USPSTF grade A and B recommendations Alcohol: social, occasional Depression:  Depression screen [facility] 2/9 05/16/2016 06/01/2015  Decreased Interest 0 0  Down, Depressed, Hopeless 1 0  PHQ - 2 Score 1 0  Hypertension: fair control Obesity: out of control; interested in surgery or pills  Tobacco use: no HIV, hep B, hep C: politely declined STD testing and prevention (chl/gon/syphilis): declined Lipids: check fasting Glucose: check fasting Colorectal cancer: no fam hx; father adopted; start at 77 Breast cancer: mammo BRCA gene screening: no breast or ovarian, not sure Intimate partner violence: no Cervical cancer screening: today Lung cancer: n/a Osteoporosis: n/a Fall prevention/vitamin D: recommend 1000 iu daily AAA: n/a Aspirin: n/a Diet: eats more than when single; salads, veggies Exercise: not now; will work up gradually Skin cancer: nothing worrisome  Past Medical History  Diagnosis Date  . Hypertension   . IFG (impaired fasting glucose) 05/16/2016  . 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 Abnormal thyroid function test 05/16/2016  . Decreased libido 05/16/2016  . Morbid obesity ([place]) 06/01/2015   Past Surgical History  Procedure Laterality Date  . Hammer toe surgery    right breast biopsy Oct 2015 --> benign fibroadenoma  Family History  Problem Relation Age of Onset  .  Hypertension Mother   . Heart disease Mother   . Arthritis Mother   . Depression Mother   . Stroke Maternal Grandmother   no first degree relative with diabetes Mother - bipolar  Social History  Substance Use Topics  . Smoking status: Never Smoker   . Smokeless tobacco: Never Used  . Alcohol Use: No   Review of Systems  Objective:   Filed Vitals:   05/16/16 1412  BP: 132/84  Pulse: 85  Temp: 97.5 F (36.4 C)  TempSrc: Oral  Resp: 16  Weight: 242 lb (109.77 kg)  SpO2: 96%   Body mass index is 40.27 kg/(m^2). Wt Readings from Last 3 Encounters:  05/16/16 242 lb (109.77 kg)  02/12/16 238 lb 7 oz (108.155 kg)  06/01/15 237 lb 3.2 oz (107.593 kg)   Physical Exam  Constitutional: She appears well-developed and well-nourished.  HENT:  Head: Normocephalic and atraumatic.  Eyes: Conjunctivae and EOM are normal. Right eye exhibits no hordeolum. Left eye exhibits no hordeolum. No scleral icterus.  Neck: Carotid bruit is not present. No thyromegaly present.  Cardiovascular: Normal rate, regular rhythm, S1 normal, S2 normal and normal heart sounds.   No extrasystoles are present.  Pulmonary/Chest: Effort normal and breath sounds normal. No respiratory distress. Right breast exhibits no inverted nipple, no mass, no nipple discharge, no skin change and no tenderness. Left breast exhibits no inverted nipple, no mass (5 o'clock position, soft, rubbery, no olverlying skin changes), no nipple discharge, no skin change and no tenderness. Breasts are symmetrical.  Abdominal: Soft. Normal appearance and bowel sounds are normal. She exhibits no distension, no  abdominal bruit, no pulsatile midline mass and no mass. There is no hepatosplenomegaly. There is no tenderness. No hernia.  Genitourinary: Uterus normal. Pelvic exam was performed with patient prone. There is no rash or lesion on the right labia. There is no rash or lesion on the left labia. Cervix exhibits no motion tenderness. Right  adnexum displays no mass, no tenderness and no fullness. Left adnexum displays no mass, no tenderness and no fullness.  Musculoskeletal: Normal range of motion. She exhibits no edema.  Lymphadenopathy:       Head (right side): No submandibular adenopathy present.       Head (left side): No submandibular adenopathy present.    She has no cervical adenopathy.    She has no axillary adenopathy.  Neurological: She is alert. She displays no tremor. No cranial nerve deficit. She exhibits normal muscle tone. Gait normal.  Skin: Skin is warm and dry. No bruising and no ecchymosis noted. No cyanosis. No pallor.  Psychiatric: Her speech is normal and behavior is normal. Thought content normal. Her mood appears not anxious. She does not exhibit a depressed mood.    Assessment/Plan:   Problem List Items Addressed This Visit      Endocrine   IFG (impaired fasting glucose)    Weight loss key for not developing diabetes down the road; encourged weigh tloss      Relevant Orders   Hemoglobin A1c (Completed)     Other   Surveillance for birth control, oral contraceptives    At her age, with morbid obesity, would suggest she meet with gyn to discuss over options for birth control      Preventative health care - Primary    USPSTF grade A and B recommendations reviewed with patient; age-appropriate recommendations, preventive care, screening tests, etc discussed and encouraged; healthy living encouraged; see AVS for patient education given to patient      Relevant Orders   CBC with Differential/Platelet (Completed)   Comprehensive metabolic panel (Completed)   Lipid Panel w/o Chol/HDL Ratio (Completed)   TSH (Completed)   Morbid obesity ([place])    Encouraged weight loss; see AVS      History of abnormal cervical Pap smear    Back to routine screening now; thin prep collected      Decreased libido   Contraception management   Relevant Orders   Ambulatory referral to Gynecology   Breast lump  on left side at 5 o'clock position    Feels benign but will get mammo and [place]      Relevant Orders   US BREAST LTD UNI LEFT INC AXILLA (Completed)   MM DIAG BREAST TOMO BILATERAL (Completed)   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   Orders Placed This Encounter  Procedures  . US BREAST LTD UNI LEFT  INC AXILLA    Order Specific Question:  Reason for Exam (SYMPTOM  OR DIAGNOSIS REQUIRED)    Answer:  breast lump 5oclock lt breast    Order Specific Question:  Preferred imaging location?    Answer:  [facility]  . MM DIAG BREAST TOMO BILATERAL    Order Specific Question:  Reason for Exam (SYMPTOM  OR DIAGNOSIS REQUIRED)    Answer:  breast lump 5 o'clock position LEFT breast; hx of RIGHT fibroadenoma bx    Order Specific Question:  Is the patient pregnant?    Answer:  No    Order Specific Question:  Preferred imaging location?    Answer:  [facility]  . CBC with Differential/Platelet  . Comprehensive metabolic panel    Order Specific Question:  Has the patient fasted?    Answer:  Yes  . Lipid Panel w/o Chol/HDL Ratio    Order Specific Question:  Has the patient fasted?    Answer:  Yes  . TSH  . T4, free  . Hemoglobin A1c  . VITAMIN D 25 Hydroxy (Vit-D Deficiency, Fractures)  . Specimen status report  . Ambulatory referral to General Surgery    Referral Priority:  Routine    Referral Type:  Surgical    Referral Reason:  Specialty  Services Required    Requested Specialty:  General Surgery    Number of Visits Requested:  1  . Ambulatory referral to Gynecology    Referral Priority:  Routine    Referral Type:  Consultation    Referral Reason:  Specialty Services Required    Requested Specialty:  Gynecology    Number of Visits Requested:  1   Follow up plan: Return in about 1 year (around 05/16/2017) for complete physical.  An After Visit Summary was printed and given to the patient.

## 2016-05-18 ENCOUNTER — Encounter: Payer: Self-pay | Admitting: Family Medicine

## 2016-05-18 LAB — COMPREHENSIVE METABOLIC PANEL
ALK PHOS: 72 IU/L (ref 39–117)
ALT: 16 IU/L (ref 0–32)
AST: 15 IU/L (ref 0–40)
Albumin/Globulin Ratio: 1.3 (ref 1.2–2.2)
Albumin: 3.9 g/dL (ref 3.5–5.5)
BUN/Creatinine Ratio: 12 (ref 9–23)
BUN: 9 mg/dL (ref 6–24)
Bilirubin Total: 0.5 mg/dL (ref 0.0–1.2)
CHLORIDE: 101 mmol/L (ref 96–106)
CO2: 24 mmol/L (ref 18–29)
CREATININE: 0.77 mg/dL (ref 0.57–1.00)
Calcium: 9.1 mg/dL (ref 8.7–10.2)
GFR calc Af Amer: 109 mL/min/{1.73_m2} (ref >59)
GFR calc non Af Amer: 94 mL/min/{1.73_m2} (ref >59)
GLUCOSE: 83 mg/dL (ref 65–99)
Globulin, Total: 3 g/dL (ref 1.5–4.5)
Potassium: 4.6 mmol/L (ref 3.5–5.2)
SODIUM: 139 mmol/L (ref 134–144)
Total Protein: 6.9 g/dL (ref 6.0–8.5)

## 2016-05-18 LAB — T4, FREE: Free T4: 0.95 ng/dL (ref 0.82–1.77)

## 2016-05-18 LAB — CBC WITH DIFFERENTIAL/PLATELET
BASOS ABS: 0 10*3/uL (ref 0.0–0.2)
Basos: 0 %
EOS (ABSOLUTE): 0.3 10*3/uL (ref 0.0–0.4)
Eos: 4 %
Hematocrit: 38.8 % (ref 34.0–46.6)
Hemoglobin: 12.9 g/dL (ref 11.1–15.9)
Immature Grans (Abs): 0 10*3/uL (ref 0.0–0.1)
Immature Granulocytes: 0 %
Lymphocytes Absolute: 2.8 10*3/uL (ref 0.7–3.1)
Lymphs: 38 %
MCH: 32.3 pg (ref 26.6–33.0)
MCHC: 33.2 g/dL (ref 31.5–35.7)
MCV: 97 fL (ref 79–97)
MONOCYTES: 4 %
MONOS ABS: 0.3 10*3/uL (ref 0.1–0.9)
NEUTROS ABS: 3.9 10*3/uL (ref 1.4–7.0)
Neutrophils: 54 %
PLATELETS: 297 10*3/uL (ref 150–379)
RBC: 4 x10E6/uL (ref 3.77–5.28)
RDW: 13.2 % (ref 12.3–15.4)
WBC: 7.3 10*3/uL (ref 3.4–10.8)

## 2016-05-18 LAB — LIPID PANEL W/O CHOL/HDL RATIO
CHOLESTEROL TOTAL: 167 mg/dL (ref 100–199)
HDL: 68 mg/dL (ref >39)
LDL Calculated: 83 mg/dL (ref 0–99)
TRIGLYCERIDES: 82 mg/dL (ref 0–149)
VLDL Cholesterol Cal: 16 mg/dL (ref 5–40)

## 2016-05-18 LAB — HEMOGLOBIN A1C
ESTIMATED AVERAGE GLUCOSE: 114 mg/dL
Hgb A1c MFr Bld: 5.6 % (ref 4.8–5.6)

## 2016-05-18 LAB — TSH: TSH: 1.31 u[IU]/mL (ref 0.450–4.500)

## 2016-05-21 LAB — IGP, RFX APTIMA HPV ASCU: PAP SMEAR COMMENT: 0

## 2016-05-22 ENCOUNTER — Telehealth: Payer: Self-pay

## 2016-05-22 MED ORDER — FLUCONAZOLE 150 MG PO TABS: 150.0000 mg | ORAL_TABLET | Freq: Once | ORAL | Status: DC

## 2016-05-22 NOTE — Telephone Encounter (Signed)
I spoke with pt about labs; thyroid normal Will see about weight loss group; visit for weight loss; calorie count x 3 days; myfitnesspal; she declined referral to nutritionist Yeast on pap; sending in diflucan She'll see Dr. Marcelline Mates for contraception

## 2016-05-22 NOTE — Telephone Encounter (Signed)
E-scribe failure; I called Rx in personally

## 2016-05-22 NOTE — Telephone Encounter (Signed)
Please review pt labs

## 2016-05-23 ENCOUNTER — Other Ambulatory Visit: Payer: Self-pay | Admitting: Family Medicine

## 2016-05-23 DIAGNOSIS — Z8742 Personal history of other diseases of the female genital tract: Secondary | ICD-10-CM

## 2016-05-23 NOTE — Progress Notes (Signed)
Lori Oliver, please have them add on HPV; thank you

## 2016-05-23 NOTE — Assessment & Plan Note (Signed)
Add on HPV, not done

## 2016-05-25 ENCOUNTER — Other Ambulatory Visit: Payer: Self-pay | Admitting: Family Medicine

## 2016-05-28 ENCOUNTER — Ambulatory Visit
Admission: RE | Admit: 2016-05-28 | Discharge: 2016-05-28 | Disposition: A | Discharge status: Home / Self Care | Payer: Managed Care, Other (non HMO) | Source: Ambulatory Visit | Attending: Family Medicine | Admitting: Family Medicine

## 2016-05-28 ENCOUNTER — Ambulatory Visit: Admission: RE | Admit: 2016-05-28 | Payer: Managed Care, Other (non HMO) | Source: Ambulatory Visit

## 2016-05-28 DIAGNOSIS — N63 Unspecified lump in breast: Secondary | ICD-10-CM | POA: Diagnosis not present

## 2016-05-30 ENCOUNTER — Ambulatory Visit: Payer: No Typology Code available for payment source | Admitting: General Surgery

## 2016-05-30 ENCOUNTER — Telehealth: Payer: Self-pay | Admitting: Family Medicine

## 2016-05-30 DIAGNOSIS — N62 Hypertrophy of breast: Secondary | ICD-10-CM

## 2016-05-30 NOTE — Assessment & Plan Note (Signed)
Re-refer to surgeon; see phone note 05/30/16

## 2016-05-30 NOTE — Telephone Encounter (Signed)
Melody from [facility] will be sending the referral back to [place]. Wanted to inform you that patient is needing a Psychiatric nurse, that they do not do reductions. She did suggest Dr Nicholaus Bloom (thats who they use often) in [place]. Give them a call to understand there process because medical records have to be sent first. [phone]. She also suggested Dr Marla Roe from [facility] (however she does not know there process or number). If you have more questions please give Melody a call [phone]

## 2016-05-30 NOTE — Telephone Encounter (Signed)
New referral entered 

## 2016-06-04 ENCOUNTER — Telehealth: Payer: Self-pay | Admitting: Family Medicine

## 2016-06-04 NOTE — Telephone Encounter (Signed)
Patient dropped of paperwork for her insurance last week and she was just checking status on it. Also she was thinking about doing breast reduction but [facility] does not do that.

## 2016-06-06 NOTE — Telephone Encounter (Signed)
I am 99% sure that her paperwork didn't actually require anything from me, nothing for me to sign, so it should be available for her to pick back up if she hasn't already picked it up She needs to see plastic surgery, not [facility] Thank you for helping with this

## 2016-06-07 NOTE — Telephone Encounter (Signed)
Pt.notified

## 2016-06-15 ENCOUNTER — Encounter: Payer: Self-pay | Admitting: Family Medicine

## 2016-06-15 LAB — SPECIMEN STATUS REPORT

## 2016-06-15 LAB — HPV, 16/18,45: HPV, high-risk: NEGATIVE

## 2016-06-15 NOTE — Assessment & Plan Note (Signed)
Refer to plastic surgery for consideration of reduction mammoplasty; weight loss key too

## 2016-06-15 NOTE — Assessment & Plan Note (Signed)
Feels benign but will get mammo and [place]

## 2016-06-15 NOTE — Assessment & Plan Note (Signed)
USPSTF grade A and B recommendations reviewed with patient; age-appropriate recommendations, preventive care, screening tests, etc discussed and encouraged; healthy living encouraged; see AVS for patient education given to patient  

## 2016-06-15 NOTE — Assessment & Plan Note (Signed)
Weight loss key for not developing diabetes down the road; encourged weigh tloss

## 2016-06-15 NOTE — Assessment & Plan Note (Signed)
At her age, with morbid obesity, would suggest she meet with gyn to discuss over options for birth control

## 2016-06-15 NOTE — Assessment & Plan Note (Signed)
Back to routine screening now; thin prep collected

## 2016-06-15 NOTE — Assessment & Plan Note (Signed)
Encouraged weight loss; see AVS 

## 2016-06-23 ENCOUNTER — Other Ambulatory Visit: Payer: Self-pay | Admitting: Family Medicine

## 2016-06-24 ENCOUNTER — Other Ambulatory Visit: Payer: Self-pay

## 2016-06-24 MED ORDER — SPRINTEC 28 0.25-35 MG-MCG PO TABS: 1.0000 | ORAL_TABLET | Freq: Every day | ORAL | Status: DC

## 2016-06-24 NOTE — Telephone Encounter (Signed)
Is out needs refill asap

## 2016-06-24 NOTE — Telephone Encounter (Signed)
Patient is supposed to be seeing GYN for contraceptive management She has an appt with GYN at the end of the month Explained that combination OCPs increase risk of stroke and heart attack; progesterone-only methods do not; she did not want to switch pills at this time; will pray about it and see GYN at the end of the month She has not heard yet about plastic surgery referral; I'll ask staff to look into that; apologized that our system does not differentiate between general surgery, plastic surgery, and bariatric surgery and they all get lumped under "general" I invited her to think about weight loss; why she eats, what she eats, when she eats; psychological factors play a part for some people, and she'll consider and schedule visit ---------------------------- Charisse March, Please contact patient about the referral to plastic surgery for reduction mammoplasty; see 05/30/16 referral; she'd love to hear from you Thank you

## 2016-06-24 NOTE — Telephone Encounter (Signed)
I referred patient to GYN because I don't think she should still be taking combination OCPs Please ask her where she stands with that appt If she is seeing GYN, have her contact them for contraceptive management If she hasn't seen GYN, can she stop the OCPs and use condom if sexually active? Thanks

## 2016-06-25 NOTE — Telephone Encounter (Signed)
See other note. done 

## 2016-07-11 ENCOUNTER — Ambulatory Visit (INDEPENDENT_AMBULATORY_CARE_PROVIDER_SITE_OTHER): Payer: Managed Care, Other (non HMO) | Admitting: Obstetrics and Gynecology

## 2016-07-11 ENCOUNTER — Encounter: Payer: Self-pay | Admitting: Obstetrics and Gynecology

## 2016-07-11 VITALS — BP 143/84 | HR 88 | Ht 66.0 in | Wt 244.2 lb

## 2016-07-11 DIAGNOSIS — Z9114 Patient's other noncompliance with medication regimen: Secondary | ICD-10-CM

## 2016-07-11 DIAGNOSIS — Z3009 Encounter for other general counseling and advice on contraception: Secondary | ICD-10-CM

## 2016-07-11 DIAGNOSIS — I10 Essential (primary) hypertension: Secondary | ICD-10-CM | POA: Diagnosis not present

## 2016-07-11 DIAGNOSIS — Z309 Encounter for contraceptive management, unspecified: Secondary | ICD-10-CM | POA: Diagnosis not present

## 2016-07-11 NOTE — Progress Notes (Signed)
GYNECOLOGY CLINIC PROGRESS NOTE  Subjective:    Lori Oliver is a 45 y.o. G63P1001 female who presents as a referral from Dr. Enid Derry  for contraception counseling. The patient has no complaints today. The patient is sexually active. Pertinent past medical history: hypertension.  She is currently on combined OCPs, however notes that she sometimes forgets to take pills (as missed approximately 4 pills and current pill pack).  Her PCP is also concerned about her being on combined OCPs with history of hypertension.   Patient's last menstrual period was 06/20/2016.   Menstrual History: OB History    Gravida Para Term Preterm AB Living   1 1       1    SAB TAB Ectopic Multiple Live Births                  Obstetric Comments   1st Menstrual Cycle:  12 1st Pregnancy:  15       Past Medical History:  Diagnosis Date  . Abnormal thyroid function test 05/16/2016  . 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 Decreased libido 05/16/2016  . Hypertension   . IFG (impaired fasting glucose) 05/16/2016  . Morbid obesity ([place]) 06/01/2015   Family History  Problem Relation Age of Onset  . Hypertension Mother   . Heart disease Mother   . Arthritis Mother   . Depression Mother   . Stroke Maternal Grandmother     Past Surgical History:  Procedure Laterality Date  . HAMMER TOE SURGERY     Social History   Social History  . Marital status: Married    Spouse name: N/A  . Number of children: N/A  . Years of education: N/A   Occupational History  . Not on file.   Social History Main Topics  . Smoking status: Never Smoker  . Smokeless tobacco: Never Used  . Alcohol use No  . Drug use: No  . Sexual activity: Yes    Partners: Male    Birth control/ protection: Pill   Other Topics Concern  . Not on file   Social History Narrative  . No narrative on file    Current Outpatient Prescriptions on File Prior to Visit  Medication Sig Dispense Refill  . SPRINTEC 28 0.25-35 MG-MCG  tablet Take 1 tablet by mouth daily. 28 tablet 0   No current facility-administered medications on file prior to visit.     No Known Allergies   Review of Systems A comprehensive review of systems was negative except for: Constitutional: positive for fatigue Gastrointestinal: positive for increased appetite   Objective:   Blood pressure (!) 143/84, pulse 88, height 5\' 6"  (1.676 m), weight 244 lb 3.2 oz (110.8 kg), last menstrual period 06/20/2016. General appearance: alert and no distress Lungs: clear to auscultation bilaterally Heart: regular rate and rhythm, S1, S2 normal, no murmur, click, rub or gallop Abdomen: soft, non-tender; bowel sounds normal; no masses,  no organomegaly Pelvic: deferred   Assessment:   45 y.o. female currently on combined OCP (estrogen/progesterone), contraindications cHTN. Medication noncompliance  Plan:   Chronic hypertension - currently controlled not on any medications. Contraception - Reviewed all forms of birth control options available including abstinence; over the counter/barrier methods; non-estrogen contraceptive medication including injection, contraceptive implant; hormonal and nonhormonal IUDs; permanent sterilization options including vasectomy and the various tubal sterilization modalities. Risks and benefits reviewed.  Questions were answered.  Information was given to patient to review. Patient desires Nexplanon.  Discussed risks vs benefits.  Will plan for insertion next week during patient's menstrual cycle.   Patient with symptoms of increased appetite and and fatigue.  Offered to perform UPT today in light of missing several pills in fact this month, however patient declines. Informed that if cycle has not begun by time of implant placement next week pregnancy test will be performed. Patient notes understanding.   Rubie Maid, MD [facility]

## 2016-07-18 ENCOUNTER — Encounter: Payer: Self-pay | Admitting: Obstetrics and Gynecology

## 2016-07-18 ENCOUNTER — Ambulatory Visit (INDEPENDENT_AMBULATORY_CARE_PROVIDER_SITE_OTHER): Payer: Managed Care, Other (non HMO) | Admitting: Obstetrics and Gynecology

## 2016-07-18 VITALS — BP 150/90 | HR 77 | Ht 66.0 in | Wt 240.3 lb

## 2016-07-18 DIAGNOSIS — Z30017 Encounter for initial prescription of implantable subdermal contraceptive: Secondary | ICD-10-CM | POA: Diagnosis not present

## 2016-07-18 DIAGNOSIS — N926 Irregular menstruation, unspecified: Secondary | ICD-10-CM | POA: Diagnosis not present

## 2016-07-18 LAB — POCT URINE PREGNANCY: PREG TEST UR: NEGATIVE

## 2016-07-18 NOTE — Progress Notes (Signed)
     [place] PROCEDURE NOTE  Lori Oliver is a 45 y.o. G1P1 here for Nexplanon insertion.  Last pap smear was on  and was normal.  No other gynecologic concerns. Patient's last menstrual period was 06/20/2016.  Nexplanon Insertion Procedure Patient identified, informed consent performed, consent signed.   Patient does understand that irregular bleeding is a very common side effect of this medication. She was advised to have backup contraception for one week after placement. Pregnancy test in clinic today was negative.  Appropriate time out taken.  Patient's left arm was prepped and draped in the usual sterile fashion. The ruler used to measure and mark insertion area.  Patient was prepped with alcohol swab and then injected with 3 ml of 1% lidocaine.  She was prepped with betadine, Nexplanon removed from packaging,  Device confirmed in needle, then inserted full length of needle and withdrawn per handbook instructions. Nexplanon was able to palpated in the patient's arm; patient palpated the insert herself. There was minimal blood loss.  Patient insertion site covered with guaze and a pressure bandage to reduce any bruising.  The patient tolerated the procedure well and was given post procedure instructions.   Lot: [id number] Exp: 04/2018   Rubie Maid, MD [facility]

## 2016-07-18 NOTE — Patient Instructions (Signed)
NEXPLANON PLACEMENT POST-PROCEDURE INSTRUCTIONS  1. You may take Ibuprofen, Aleve or Tylenol for pain if needed.  Pain should resolve within in 24 hours.  2. You may have intercourse after 24 hours.  If you using this for birth control, it is effective immediately.  3. You need to call if you have any fever, heavy bleeding, or redness at insertion site. Irregular bleeding is common the first several months after having a Nexplanonplaced. You do not need to call for this reason unless you are concerned.  4. Shower or bathe as normal.  You can remove the bandage after 24 hours. 5.   

## 2016-09-24 ENCOUNTER — Ambulatory Visit (INDEPENDENT_AMBULATORY_CARE_PROVIDER_SITE_OTHER): Payer: Managed Care, Other (non HMO) | Admitting: Family Medicine

## 2016-09-24 ENCOUNTER — Encounter: Payer: Self-pay | Admitting: Family Medicine

## 2016-09-24 DIAGNOSIS — Z6839 Body mass index (BMI) 39.0-39.9, adult: Secondary | ICD-10-CM | POA: Diagnosis not present

## 2016-09-24 DIAGNOSIS — E6609 Other obesity due to excess calories: Secondary | ICD-10-CM

## 2016-09-24 MED ORDER — LIRAGLUTIDE -WEIGHT MANAGEMENT 18 MG/3ML ~~LOC~~ SOPN: 0.6000 mg | PEN_INJECTOR | Freq: Every day | SUBCUTANEOUS | 0 refills | Status: DC

## 2016-09-24 MED ORDER — PEN NEEDLES 31G X 6 MM MISC: 0 refills | Status: DC

## 2016-09-24 NOTE — Patient Instructions (Addendum)
Check out the information at familydoctor.org entitled "Nutrition for Weight Loss: What You Need to Know about Fad Diets" Try to lose between 1-2 pounds per week by taking in fewer calories and burning off more calories You can succeed by limiting portions, limiting foods dense in calories and fat, becoming more active, and drinking 8 glasses of water a day (64 ounces) Don't skip meals, especially breakfast, as skipping meals may alter your metabolism Do not use over-the-counter weight loss pills or gimmicks that claim rapid weight loss A healthy BMI (or body mass index) is between 18.5 and 24.9 You can calculate your ideal BMI at the [place] website ClubMonetize.fr Think about making one change a week Start the [place] Have the pharmacist teach you Call with any problems Stop if any symptoms of neck swelling or lump, abdominal pain, jaundice, etc

## 2016-09-24 NOTE — Progress Notes (Signed)
BP 124/84   Pulse 78   Temp 98.1 F (36.7 C) (Oral)   Resp 14   Wt 242 lb (109.8 kg)   SpO2 99%   BMI 39.06 kg/m    Subjective:    Patient ID: Lori Oliver, female    DOB: 1971-11-06, 45 y.o.   MRN: [id number]  HPI: Yanique LEYONA FRAYRE is a 45 y.o. female  Chief Complaint  Patient presents with  . Obesity    wants to see about getting weight loss med   She is interested in something for weight loss She has been eating like crazy She just needs something to help curb her appetite and help her lose weight She knows her thyroid is fine She would like to get appetite suppressant She has spent $15 on food already today; got salad but then smoothie and then donut holes Drinking lots of calories She has a lot going on right now; not dealing with situation well This is more extreme than usual No binge eating She has thought about diet medicines Dr. Nadine Counts put her on phentermine before; she didn't take as directed so didn't get results She has read about gastric bypass; she has seen people get it and sees that it works; she would consider it She works two jobs and doesn't have any energy; can get through the day because she has to, weight slowing her down  No regular exercise; she was exercising earlier, but not able to with her job and keeping grandkids; just no time to fit that in the day, knows she needs to; has gym membership; may go on the weekends Does drinks regular [employer] or coffee (five or six sugars, cream) Normal portions Does eat late in the evening; works 2nd shift; she might eat at 8 or 9 pm Doing counseling right now; does not think nutritionist would be helpful  Depression screen [facility] 2/9 09/24/2016 05/16/2016 06/01/2015  Decreased Interest 0 0 0  Down, Depressed, Hopeless 1 1 0  PHQ - 2 Score 1 1 0   Relevant past medical, surgical, family and social history reviewed Past Medical History:  Diagnosis Date  . Abnormal thyroid function test 05/16/2016  .  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 Decreased libido 05/16/2016  . Hypertension   . IFG (impaired fasting glucose) 05/16/2016  . Morbid obesity ([place]) 06/01/2015   Family History  Problem Relation Age of Onset  . Hypertension Mother   . Heart disease Mother   . Arthritis Mother   . Depression Mother   . Stroke Maternal Grandmother    Social History  Substance Use Topics  . Smoking status: Never Smoker  . Smokeless tobacco: Never Used  . Alcohol use No   Interim medical history since last visit reviewed. Allergies and medications reviewed  Review of Systems Per HPI unless specifically indicated above     Objective:    BP 124/84   Pulse 78   Temp 98.1 F (36.7 C) (Oral)   Resp 14   Wt 242 lb (109.8 kg)   SpO2 99%   BMI 39.06 kg/m   Wt Readings from Last 3 Encounters:  09/24/16 242 lb (109.8 kg)  07/18/16 240 lb 4.8 oz (109 kg)  07/11/16 244 lb 3.2 oz (110.8 kg)    Physical Exam  Constitutional: She appears well-developed and well-nourished. No distress.  Eyes: EOM are normal. No scleral icterus.  Neck: No thyromegaly present.  Cardiovascular: Normal rate and regular rhythm.   Pulmonary/Chest: Effort  normal and breath sounds normal.  Abdominal: She exhibits no distension.  Skin: No pallor.  Psychiatric: She has a normal mood and affect. Her behavior is normal. Judgment and thought content normal.      Assessment & Plan:   Problem List Items Addressed This Visit      Other   Obesity    Talked with patient about her weight struggles, supportive listening; problem-solving; I explained that I do not prescribe plain phentermine and don't plan on ever doing so at the current available dose (37.5 mg); discussed other options; she denies any hx of medullary thyroid carcinoma; no fam hx of MEN-2; will start Saxenda; this is in combination with healthy, reduced calorie eating and increased activity; close f/u      Relevant Medications   Liraglutide -Weight Management  (SAXENDA) 18 MG/3ML SOPN    Other Visit Diagnoses   None.     Follow up plan: Return in about 4 weeks (around 10/22/2016).  An after-visit summary was printed and given to the patient at [place].  Please see the patient instructions which may contain other information and recommendations beyond what is mentioned above in the assessment and plan.  Meds ordered this encounter  Medications  . Liraglutide -Weight Management (SAXENDA) 18 MG/3ML SOPN    Sig: Inject 0.6 mg into the skin daily. x 1 week, then 1.2 mg daily x 1 week, then 1.8 mg daily x 1 week, then 2.4 mg daily x 1 week, then 3 mg daily    Dispense:  15 mL    Refill:  0  . Insulin Pen Needle (PEN NEEDLES) 31G X 6 MM MISC    Sig: For use with Saxenda, daily use, subcutaneous injections for weight loss    Dispense:  100 each    Refill:  0    No orders of the defined types were placed in this encounter.

## 2016-09-28 NOTE — Assessment & Plan Note (Signed)
Talked with patient about her weight struggles, supportive listening; problem-solving; I explained that I do not prescribe plain phentermine and don't plan on ever doing so at the current available dose (37.5 mg); discussed other options; she denies any hx of medullary thyroid carcinoma; no fam hx of MEN-2; will start Saxenda; this is in combination with healthy, reduced calorie eating and increased activity; close f/u

## 2017-01-26 ENCOUNTER — Emergency Department: Payer: Managed Care, Other (non HMO)

## 2017-01-26 ENCOUNTER — Emergency Department
Admission: EM | Admit: 2017-01-26 | Discharge: 2017-01-27 | Disposition: A | Discharge status: Home / Self Care | Payer: Managed Care, Other (non HMO) | Attending: Emergency Medicine | Admitting: Emergency Medicine

## 2017-01-26 DIAGNOSIS — I10 Essential (primary) hypertension: Secondary | ICD-10-CM | POA: Diagnosis not present

## 2017-01-26 DIAGNOSIS — J189 Pneumonia, unspecified organism: Secondary | ICD-10-CM | POA: Diagnosis not present

## 2017-01-26 DIAGNOSIS — R509 Fever, unspecified: Secondary | ICD-10-CM | POA: Diagnosis present

## 2017-01-26 LAB — CBC WITH DIFFERENTIAL/PLATELET
BASOS ABS: 0.1 10*3/uL (ref 0–0.1)
BASOS PCT: 1 %
Eosinophils Absolute: 0.5 10*3/uL (ref 0–0.7)
Eosinophils Relative: 4 %
HEMATOCRIT: 32.5 % — AB (ref 35.0–47.0)
Hemoglobin: 11.5 g/dL — ABNORMAL LOW (ref 12.0–16.0)
LYMPHS PCT: 18 %
Lymphs Abs: 1.9 10*3/uL (ref 1.0–3.6)
MCH: 33 pg (ref 26.0–34.0)
MCHC: 35.2 g/dL (ref 32.0–36.0)
MCV: 93.5 fL (ref 80.0–100.0)
MONO ABS: 1.3 10*3/uL — AB (ref 0.2–0.9)
MONOS PCT: 13 %
NEUTROS ABS: 7 10*3/uL — AB (ref 1.4–6.5)
Neutrophils Relative %: 64 %
Platelets: 266 10*3/uL (ref 150–440)
RBC: 3.48 MIL/uL — ABNORMAL LOW (ref 3.80–5.20)
RDW: 12.7 % (ref 11.5–14.5)
WBC: 10.7 10*3/uL (ref 3.6–11.0)

## 2017-01-26 LAB — COMPREHENSIVE METABOLIC PANEL
ALBUMIN: 3 g/dL — AB (ref 3.5–5.0)
ALK PHOS: 61 U/L (ref 38–126)
ALT: 20 U/L (ref 14–54)
AST: 20 U/L (ref 15–41)
Anion gap: 7 (ref 5–15)
BILIRUBIN TOTAL: 0.6 mg/dL (ref 0.3–1.2)
BUN: 7 mg/dL (ref 6–20)
CALCIUM: 7.8 mg/dL — AB (ref 8.9–10.3)
CO2: 24 mmol/L (ref 22–32)
CREATININE: 0.86 mg/dL (ref 0.44–1.00)
Chloride: 104 mmol/L (ref 101–111)
GFR calc Af Amer: >60 mL/min (ref >60)
GLUCOSE: 157 mg/dL — AB (ref 65–99)
Potassium: 3.2 mmol/L — ABNORMAL LOW (ref 3.5–5.1)
Sodium: 135 mmol/L (ref 135–145)
TOTAL PROTEIN: 7.4 g/dL (ref 6.5–8.1)

## 2017-01-26 LAB — INFLUENZA PANEL BY PCR (TYPE A & B)
INFLAPCR: NEGATIVE
INFLBPCR: NEGATIVE

## 2017-01-26 MED ORDER — SODIUM CHLORIDE 0.9 % IV BOLUS (SEPSIS)
1000.0000 mL | Freq: Once | INTRAVENOUS | Status: AC
  Administered 2017-01-26: 1000 mL via INTRAVENOUS

## 2017-01-26 MED ORDER — ALBUTEROL SULFATE HFA 108 (90 BASE) MCG/ACT IN AERS: 2.0000 | INHALATION_SPRAY | Freq: Four times a day (QID) | RESPIRATORY_TRACT | 2 refills | Status: DC | PRN

## 2017-01-26 MED ORDER — ACETAMINOPHEN 325 MG PO TABS
650.0000 mg | ORAL_TABLET | Freq: Once | ORAL | Status: AC | PRN
  Administered 2017-01-26: 650 mg via ORAL

## 2017-01-26 MED ORDER — ACETAMINOPHEN 325 MG PO TABS
ORAL_TABLET | ORAL | Status: AC
  Filled 2017-01-26: qty 2

## 2017-01-26 MED ORDER — ALBUTEROL SULFATE (2.5 MG/3ML) 0.083% IN NEBU
5.0000 mg | INHALATION_SOLUTION | Freq: Once | RESPIRATORY_TRACT | Status: AC
  Administered 2017-01-26: 5 mg via RESPIRATORY_TRACT
  Filled 2017-01-26: qty 6

## 2017-01-26 MED ORDER — CEFTRIAXONE SODIUM-DEXTROSE 1-3.74 GM-% IV SOLR
1.0000 g | Freq: Once | INTRAVENOUS | Status: AC
  Administered 2017-01-26: 1 g via INTRAVENOUS
  Filled 2017-01-26: qty 50

## 2017-01-26 MED ORDER — AZITHROMYCIN 250 MG PO TABS: ORAL_TABLET | ORAL | 0 refills | Status: DC

## 2017-01-26 MED ORDER — DEXTROSE 5 % IV SOLN: 1.0000 g | Freq: Once | INTRAVENOUS | Status: DC

## 2017-01-26 NOTE — ED Provider Notes (Signed)
Time Seen: Approximately *2152  I have reviewed the triage notes  Chief Complaint: Fever; Cough; and Diarrhea   History of Present Illness: Lori Oliver is a 46 y.o. female who presents with a persistent fever, cough, body aches. Patient's now had recent onset of some previous nausea and vomiting which is now resolved. She has some mild loose stool without any true diarrhea. [facility] was told that she had a virus and tested negative for influenza. Patient describes some increasing shortness of breath body aches and arrives with a temperature of 102.2. As any history of asthma.   Past Medical History:  Diagnosis Date  . Abnormal thyroid function test 05/16/2016  . 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 Decreased libido 05/16/2016  . Hypertension   . IFG (impaired fasting glucose) 05/16/2016  . Morbid obesity ([place]) 06/01/2015    Patient Active Problem List   Diagnosis Date Noted  . Hypertension 07/11/2016  . Preventative health care 05/16/2016  . IFG (impaired fasting glucose) 05/16/2016  . 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 Abnormal thyroid function test 05/16/2016  . Decreased libido 05/16/2016  . Breast cancer screening 05/16/2016  . Breast lump on left side at 5 o'clock position 05/16/2016  . Contraception management 05/16/2016  . Surveillance for birth control, oral contraceptives 02/12/2016  . Obesity 06/01/2015  . Bilateral thoracic back pain 06/01/2015  . Blood pressure elevated without history of HTN 06/01/2015  . History of breast lump 06/01/2015  . History of abnormal cervical Pap smear 06/01/2015    Past Surgical History:  Procedure Laterality Date  . HAMMER TOE SURGERY      Past Surgical History:  Procedure Laterality Date  . HAMMER TOE SURGERY      Current Outpatient Rx  . Order #: [id number] Class: Print  . Order #: [id number] Class: Print    Allergies:  Patient has no known allergies.  Family History: Family  History  Problem Relation Age of Onset  . Hypertension Mother   . Heart disease Mother   . Arthritis Mother   . Depression Mother   . Stroke Maternal Grandmother     Social History: Social History  Substance Use Topics  . Smoking status: Never Smoker  . Smokeless tobacco: Never Used  . Alcohol use No     Review of Systems:   10 point review of systems was performed and was otherwise negative:  Constitutional: Fevers initially low-grade and then seemed to spike this evening Eyes: No visual disturbances ENT: No sore throat, ear pain Cardiac: No chest pain Respiratory: Increasing shortness of breath with a wet nonproductive cough. Abdomen: No abdominal pain, no vomiting, No diarrhea Endocrine: No weight loss, No night sweats Extremities: No peripheral edema, cyanosis Skin: No rashes, easy bruising Neurologic: No focal weakness, trouble with speech or swollowing Urologic: No dysuria, Hematuria, or urinary frequency Patient denies any risk of being pregnant  Physical Exam:  ED Triage Vitals  Enc Vitals Group     BP 01/26/17 2108 (!) 147/81     Pulse Rate 01/26/17 2108 (!) 102     Resp 01/26/17 2108 (!) 22     Temp 01/26/17 2108 (!) 102.2 F (39 C)     Temp Source 01/26/17 2108 Oral     SpO2 01/26/17 2108 100 %     Weight 01/26/17 2109 240 lb (108.9 kg)     Height 01/26/17 2109 5\' 6"  (1.676 m)     Head Circumference --  Peak Flow --      Pain Score 01/26/17 2109 10     Pain Loc --      Pain Edu? --      Excl. in [place]? --     General: Awake , Alert , and Oriented times 3; GCS 15 She does not appear to be in respiratory distress and able to speak in full and complete sentences  Head: Normal cephalic , atraumatic Eyes: Pupils equal , round, reactive to light Nose/Throat: No nasal drainage, patent upper airway without erythema or exudate.  Neck: Supple, Full range of motion, No anterior adenopathy or palpable thyroid masses Lungs: Patient has wheezing auscultated  at the apices symmetrically with some worse breast sounds auscultated posteriorly at the bases  Heart: Regular rate, regular rhythm without murmurs , gallops , or rubs Abdomen: Soft, non tender without rebound, guarding , or rigidity; bowel sounds positive and symmetric in all 4 quadrants. No organomegaly .        Extremities: 2 plus symmetric pulses. No edema, clubbing or cyanosis Neurologic: normal ambulation, Motor symmetric without deficits, sensory intact Skin: warm, dry, no rashes   Labs:   All laboratory work was reviewed including any pertinent negatives or positives listed below:  Labs Reviewed  CBC WITH DIFFERENTIAL/PLATELET - Abnormal; Notable for the following:       Result Value   RBC 3.48 (*)    Hemoglobin 11.5 (*)    HCT 32.5 (*)    Neutro Abs 7.0 (*)    Monocytes Absolute 1.3 (*)    All other components within normal limits  INFLUENZA PANEL BY PCR (TYPE A & B)  COMPREHENSIVE METABOLIC PANEL  Influenza testing was negative  EKG:  ED ECG REPORT I, Daymon Larsen, the attending physician, personally viewed and interpreted this ECG.  Date: 01/26/2017 EKG Time: 2114 Rate: 100 Rhythm: normal sinus rhythm QRS Axis: normal Intervals: normal ST/T Wave abnormalities: normal Conduction Disturbances: none Narrative Interpretation: unremarkable Normal EKG   Radiology:  "Dg Chest 2 View  Result Date: 01/26/2017 CLINICAL DATA:  Cough and congestion EXAM: CHEST  2 VIEW COMPARISON:  None. FINDINGS: Cardiac shadow is within normal limits. Patchy infiltrative changes are noted bilaterally. These changes are most marked in the lingula on the left. No sizable effusion is seen. No bony abnormality is noted. IMPRESSION: Bilateral infiltrates most notable in the lingula on the left. No other focal abnormality is seen. Electronically Signed   By: Inez Catalina M.D.   On: 01/26/2017 21:38  "  I personally reviewed the radiologic studies    ED Course:  Patient's stay here  showed symptomatic improvement with a DuoNeb and the patient was initiated on IV antibiotics for what appears to be community-acquired pneumonia. She does not have a history of asthma and I felt her wheezing at this time is again most likely from the pneumonia. I felt given her relative stability that we could attempt outpatient treatment with oral antibiotics, Proventil inhaler     Assessment:  Community-acquired pneumonia with bronchospasm     Plan:  Outpatient Patient was advised to return immediately if condition worsens. Patient was advised to follow up with their primary care physician or other specialized physicians involved in their outpatient care. The patient and/or family member/power of attorney had laboratory results reviewed at the bedside. All questions and concerns were addressed and appropriate discharge instructions were distributed by the nursing staff.           Daymon Larsen,  MD 01/26/17 2334

## 2017-01-26 NOTE — Discharge Instructions (Signed)
Please drink plenty of fluids and Tylenol and/or ibuprofen over-the-counter for fever and body aches. Please return emergency Department for increased shortness of breath or any other new concerns. Contact her primary physician for further outpatient follow-up and recheck early this week.  Please return immediately if condition worsens. Please contact her primary physician or the physician you were given for referral. If you have any specialist physicians involved in her treatment and plan please also contact them. Thank you for using [facility] emergency [facility].

## 2017-01-26 NOTE — ED Triage Notes (Signed)
Pt ambulatory to triage with no difficulty. Pt reports cough, congestion, body aches, vomiting and diarrhea. Pt reports seen at [facility] on Saturday and told she has a virus. Pt reports getting worse.

## 2017-01-29 ENCOUNTER — Ambulatory Visit (INDEPENDENT_AMBULATORY_CARE_PROVIDER_SITE_OTHER): Payer: Managed Care, Other (non HMO) | Admitting: Family Medicine

## 2017-01-29 ENCOUNTER — Ambulatory Visit
Admission: RE | Admit: 2017-01-29 | Discharge: 2017-01-29 | Disposition: A | Discharge status: Home / Self Care | Payer: Managed Care, Other (non HMO) | Source: Ambulatory Visit | Attending: Family Medicine | Admitting: Family Medicine

## 2017-01-29 ENCOUNTER — Encounter: Payer: Self-pay | Admitting: *Deleted

## 2017-01-29 ENCOUNTER — Other Ambulatory Visit: Payer: Self-pay | Admitting: Family Medicine

## 2017-01-29 ENCOUNTER — Encounter: Payer: Self-pay | Admitting: Family Medicine

## 2017-01-29 ENCOUNTER — Emergency Department
Admission: EM | Admit: 2017-01-29 | Discharge: 2017-01-29 | Disposition: A | Discharge status: Home / Self Care | Payer: Managed Care, Other (non HMO) | Attending: Emergency Medicine | Admitting: Emergency Medicine

## 2017-01-29 VITALS — BP 128/78 | HR 95 | Temp 98.4°F | Resp 16 | Wt 243.6 lb

## 2017-01-29 DIAGNOSIS — R05 Cough: Secondary | ICD-10-CM | POA: Diagnosis present

## 2017-01-29 DIAGNOSIS — Z79899 Other long term (current) drug therapy: Secondary | ICD-10-CM | POA: Insufficient documentation

## 2017-01-29 DIAGNOSIS — I10 Essential (primary) hypertension: Secondary | ICD-10-CM | POA: Insufficient documentation

## 2017-01-29 DIAGNOSIS — J189 Pneumonia, unspecified organism: Secondary | ICD-10-CM | POA: Insufficient documentation

## 2017-01-29 DIAGNOSIS — R062 Wheezing: Secondary | ICD-10-CM

## 2017-01-29 LAB — CBC
HCT: 31.8 % — ABNORMAL LOW (ref 35.0–47.0)
Hemoglobin: 11 g/dL — ABNORMAL LOW (ref 12.0–16.0)
MCH: 32.7 pg (ref 26.0–34.0)
MCHC: 34.7 g/dL (ref 32.0–36.0)
MCV: 94.2 fL (ref 80.0–100.0)
PLATELETS: 354 10*3/uL (ref 150–440)
RBC: 3.38 MIL/uL — AB (ref 3.80–5.20)
RDW: 12.8 % (ref 11.5–14.5)
WBC: 10 10*3/uL (ref 3.6–11.0)

## 2017-01-29 LAB — BASIC METABOLIC PANEL
Anion gap: 4 — ABNORMAL LOW (ref 5–15)
BUN: 7 mg/dL (ref 6–20)
CALCIUM: 8.2 mg/dL — AB (ref 8.9–10.3)
CO2: 26 mmol/L (ref 22–32)
CREATININE: 0.77 mg/dL (ref 0.44–1.00)
Chloride: 107 mmol/L (ref 101–111)
Glucose, Bld: 97 mg/dL (ref 65–99)
Potassium: 3.3 mmol/L — ABNORMAL LOW (ref 3.5–5.1)
SODIUM: 137 mmol/L (ref 135–145)

## 2017-01-29 LAB — LACTIC ACID, PLASMA: LACTIC ACID, VENOUS: 0.6 mmol/L (ref 0.5–1.9)

## 2017-01-29 MED ORDER — LEVOFLOXACIN 500 MG PO TABS: 500.0000 mg | ORAL_TABLET | Freq: Every day | ORAL | 0 refills | Status: AC

## 2017-01-29 MED ORDER — LEVOFLOXACIN 750 MG PO TABS
ORAL_TABLET | ORAL | Status: AC
  Administered 2017-01-29: 750 mg via ORAL
  Filled 2017-01-29: qty 1

## 2017-01-29 MED ORDER — FLUCONAZOLE 150 MG PO TABS: 150.0000 mg | ORAL_TABLET | Freq: Once | ORAL | 0 refills | Status: AC

## 2017-01-29 MED ORDER — LEVOFLOXACIN 750 MG PO TABS
750.0000 mg | ORAL_TABLET | Freq: Once | ORAL | Status: AC
  Administered 2017-01-29: 750 mg via ORAL

## 2017-01-29 MED ORDER — AEROCHAMBER W/FLOWSIGNAL MISC: 2 refills | Status: DC

## 2017-01-29 MED ORDER — HYDROCOD POLST-CPM POLST ER 10-8 MG/5ML PO SUER: 5.0000 mL | Freq: Every evening | ORAL | 0 refills | Status: DC | PRN

## 2017-01-29 MED ORDER — IPRATROPIUM-ALBUTEROL 0.5-2.5 (3) MG/3ML IN SOLN
3.0000 mL | Freq: Once | RESPIRATORY_TRACT | Status: AC
  Administered 2017-01-29: 3 mL via RESPIRATORY_TRACT
  Filled 2017-01-29: qty 3

## 2017-01-29 NOTE — ED Triage Notes (Signed)
States she was diagnoised with pneumonia this week and had a repeat chest xray today and her PCP told her to come to ER because pneumonia had worsened

## 2017-01-29 NOTE — Progress Notes (Signed)
Patient walked in late for appt; I am willing to see her today; will get CXR and have her come back this afternoon

## 2017-01-29 NOTE — Assessment & Plan Note (Signed)
Check CXR  

## 2017-01-29 NOTE — Patient Instructions (Addendum)
Stop the azithromycin and start the new stronger medicine called levaquin Use the spacer with the inhaler every 4 hours if needed for wheezing and tightness Please do eat yogurt daily or take a probiotic daily for the next month or two We want to replace the healthy germs in the gut If you notice foul, watery diarrhea in the next two months, schedule an appointment RIGHT AWAY Use the fluconazole if needed You are contagious so stay out of work until Monday, but if you are still coughing or sick or weak, call me to extend the work note Return in 4 weeks for recheck and pneumonia vaccine  Community-Acquired Pneumonia, Adult Introduction Pneumonia is an infection of the lungs. One type of pneumonia can happen while a person is in a hospital. A different type can happen when a person is not in a hospital (community-acquired pneumonia). It is easy for this kind to spread from person to person. It can spread to you if you breathe near an infected person who coughs or sneezes. Some symptoms include:  A dry cough.  A wet (productive) cough.  Fever.  Sweating.  Chest pain. Follow these instructions at home:  Take over-the-counter and prescription medicines only as told by your doctor.  Only take cough medicine if you are losing sleep.  If you were prescribed an antibiotic medicine, take it as told by your doctor. Do not stop taking the antibiotic even if you start to feel better.  Sleep with your head and neck raised (elevated). You can do this by putting a few pillows under your head, or you can sleep in a recliner.  Do not use tobacco products. These include cigarettes, chewing tobacco, and e-cigarettes. If you need help quitting, ask your doctor.  Drink enough water to keep your pee (urine) clear or pale yellow. A shot (vaccine) can help prevent pneumonia. Shots are often suggested for:  People older than 45 years of age.  People older than 45 years of age:  Who are having cancer  treatment.  Who have long-term (chronic) lung disease.  Who have problems with their body's defense system (immune system). You may also prevent pneumonia if you take these actions:  Get the flu (influenza) shot every year.  Go to the dentist as often as told.  Wash your hands often. If soap and water are not available, use hand sanitizer. Contact a doctor if:  You have a fever.  You lose sleep because your cough medicine does not help. Get help right away if:  You are short of breath and it gets worse.  You have more chest pain.  Your sickness gets worse. This is very serious if:  You are an older adult.  Your body's defense system is weak.  You cough up blood. This information is not intended to replace advice given to you by your health care provider. Make sure you discuss any questions you have with your health care provider. Document Released: 05/20/2008 Document Revised: 05/09/2016 Document Reviewed: 03/29/2015  2017 Elsevier

## 2017-01-29 NOTE — Progress Notes (Signed)
BP 128/78   Pulse 95   Temp 98.4 F (36.9 C) (Oral)   Resp 16   Wt 243 lb 9 oz (110.5 kg)   SpO2 96%   BMI 39.31 kg/m    Subjective:    Patient ID: Lori Oliver, female    DOB: 07/23/1971, 46 y.o.   MRN: [id number]  HPI: Lori Oliver is a 46 y.o. female  Chief Complaint  Patient presents with  . 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 Abnormal thyroid function test 05/16/2016  . 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 Decreased libido 05/16/2016  . Hypertension   . IFG (impaired fasting glucose) 05/16/2016  . Morbid obesity ([place]) 06/01/2015   Past Surgical History:  Procedure Laterality Date  . HAMMER TOE SURGERY     Family History  Problem Relation Age of Onset  . Hypertension Mother   . Heart disease Mother   . Arthritis Mother   . Depression Mother   . Stroke Maternal Grandmother    Social History  Substance Use Topics  . Smoking status: Never Smoker  . Smokeless tobacco: Never Used  . Alcohol use No   Interim medical history since last visit  reviewed. Allergies and medications reviewed  Review of Systems Per HPI unless specifically indicated above     Objective:    BP 128/78   Pulse 95   Temp 98.4 F (36.9 C) (Oral)   Resp 16   Wt 243 lb 9 oz (110.5 kg)   SpO2 96%   BMI 39.31 kg/m   Wt Readings from Last 3 Encounters:  01/29/17 243 lb 9 oz (110.5 kg)  01/26/17 240 lb (108.9 kg)  09/24/16 242 lb (109.8 kg)    Physical Exam  Constitutional: She appears well-developed and well-nourished. She appears distressed (appears to not feel well, wearing mask, but nontoxic).  Cardiovascular: Normal rate and regular rhythm.   Pulmonary/Chest: Effort normal. No respiratory distress. She has wheezes. She has rhonchi (diffuse bilateral).  Skin: She is not diaphoretic. No pallor.  Psychiatric: Her mood appears not anxious. She does not exhibit a depressed mood.   Results for orders placed or performed during the hospital encounter of 01/26/17  Influenza panel by PCR (type A & B)  Result Value Ref Range   Influenza A By PCR NEGATIVE NEGATIVE   Influenza B By PCR NEGATIVE NEGATIVE  CBC with Differential/Platelet  Result Value Ref Range   WBC 10.7 3.6 - 11.0 K/uL   RBC 3.48 (L) 3.80 - 5.20 MIL/uL  Hemoglobin 11.5 (L) 12.0 - 16.0 g/dL   HCT 32.5 (L) 35.0 - 47.0 %   MCV 93.5 80.0 - 100.0 fL   MCH 33.0 26.0 - 34.0 pg   MCHC 35.2 32.0 - 36.0 g/dL   RDW 12.7 11.5 - 14.5 %   Platelets 266 150 - 440 K/uL   Neutrophils Relative % 64 %   Neutro Abs 7.0 (H) 1.4 - 6.5 K/uL   Lymphocytes Relative 18 %   Lymphs Abs 1.9 1.0 - 3.6 K/uL   Monocytes Relative 13 %   Monocytes Absolute 1.3 (H) 0.2 - 0.9 K/uL   Eosinophils Relative 4 %   Eosinophils Absolute 0.5 0 - 0.7 K/uL   Basophils Relative 1 %   Basophils Absolute 0.1 0 - 0.1 K/uL  Comprehensive metabolic panel  Result Value Ref Range   Sodium 135 135 - 145 mmol/L   Potassium 3.2 (L) 3.5 - 5.1 mmol/L   Chloride 104 101 - 111 mmol/L   CO2 24 22 - 32 mmol/L   Glucose, Bld 157  (H) 65 - 99 mg/dL   BUN 7 6 - 20 mg/dL   Creatinine, Ser 0.86 0.44 - 1.00 mg/dL   Calcium 7.8 (L) 8.9 - 10.3 mg/dL   Total Protein 7.4 6.5 - 8.1 g/dL   Albumin 3.0 (L) 3.5 - 5.0 g/dL   AST 20 15 - 41 U/L   ALT 20 14 - 54 U/L   Alkaline Phosphatase 61 38 - 126 U/L   Total Bilirubin 0.6 0.3 - 1.2 mg/dL   GFR calc non Af Amer >60 >60 mL/min   GFR calc Af Amer >60 >60 mL/min   Anion gap 7 5 - 15      Assessment & Plan:   Problem List Items Addressed This Visit      Respiratory   Bilateral pneumonia - Primary    patient has been on azithromycin x 2 days; no better but not toxic; normal RR, normal pulse ox, not tachycardic; however, she works in nursing home; no add'l O2 requirement now; get CXR; start Levaquin, to ER if worse; rest, hydrate      Relevant Medications   benzonatate (TESSALON) 100 MG capsule   levofloxacin (LEVAQUIN) 500 MG tablet   fluconazole (DIFLUCAN) 150 MG tablet    Other Visit Diagnoses    Wheezing       associated with pneumonia; getting CXR; add spacer to use with SABA; no add'l O2 requirement; don't think steroids necessary clinically, as infection #1 problem      Follow up plan: No Follow-up on file.  An after-visit summary was printed and given to the patient at [place].  Please see the patient instructions which may contain other information and recommendations beyond what is mentioned above in the assessment and plan.  Meds ordered this encounter  Medications  . benzonatate (TESSALON) 100 MG capsule    Sig: Take 100 mg by mouth daily.  Marland Kitchen ibuprofen (ADVIL,MOTRIN) 200 MG tablet    Sig: Take 200 mg by mouth every 6 (six) hours as needed. Patient takes 2 tablet every 6 hours as needed  . levofloxacin (LEVAQUIN) 500 MG tablet    Sig: Take 1 tablet (500 mg total) by mouth daily.    Dispense:  7 tablet    Refill:  0  . fluconazole (DIFLUCAN) 150 MG tablet    Sig: Take 1 tablet (150 mg total) by mouth once.    Dispense:  1 tablet    Refill:  0  .  Spacer/Aero-Holding Chambers (AEROCHAMBER W/FLOWSIGNAL) inhaler    Sig: Use as instructed    Dispense:  1 each    Refill:  2   Addendum: Chest xray shows diffuse bilateral multifocal infiltrates  With patient working in two nursing homes, having been on azithromycin for 2 days, and having progression of disease, I'm very concerned about progression of infiltrates even though no additional oxygen requirement at this time; I called her; she has not picked up the Levaquin yet; I advised her to go to the ER; will consider her to have healthcare associated pneumonia, and she will best be served going NOW to the ER, where they can check CBC, consider admission and IV antibiotics to fight this; she agrees

## 2017-01-29 NOTE — Assessment & Plan Note (Signed)
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

## 2017-01-29 NOTE — ED Provider Notes (Signed)
[facility] Emergency Department Provider Note  ____________________________________________  Time seen: Approximately 8:31 PM  I have reviewed the triage vital signs and the nursing notes.   HISTORY  Chief Complaint Cough   HPI Lori Oliver is a 46 y.o. female no significant past medical history who presents from her PCPs office for concerns of worsening pneumonia on chest x-ray. Patient was seen here 3 days ago and diagnosed with pneumonia. She was started on azithromycin and has taking 3 days. Today she had a regular follow-up with her primary care doctor and a repeat x-ray was done which was read as worsening pneumonia and patient was sent to the emergency room for further evaluation. Patient's PCP was concerned that she might have a healthcare associated pneumonia as patient works in a nursing home. Patient reports that she actually feels better. Hasn't had a fever for greater than 24 hours. She still coughing and is still complaining of chest pain that is mild pleuritic and present when she coughs. She is still wheezing but has no SOB. She has no prior history of wheezing, asthma, COPD, or smoking. Her PCP give her prescription for Levaquin.  Past Medical History:  Diagnosis Date  . Abnormal thyroid function test 05/16/2016  . 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 Decreased libido 05/16/2016  . Hypertension   . IFG (impaired fasting glucose) 05/16/2016  . Morbid obesity ([place]) 06/01/2015    Patient Active Problem List   Diagnosis Date Noted  . Bilateral pneumonia 01/29/2017  . Hypertension 07/11/2016  . Preventative health care 05/16/2016  . IFG (impaired fasting glucose) 05/16/2016  . 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 Abnormal thyroid function test 05/16/2016  . Decreased libido 05/16/2016  . Breast cancer screening 05/16/2016  . Breast lump on left side at 5 o'clock position 05/16/2016  . Contraception management 05/16/2016  .  Surveillance for birth control, oral contraceptives 02/12/2016  . Obesity 06/01/2015  . Bilateral thoracic back pain 06/01/2015  . Blood pressure elevated without history of HTN 06/01/2015  . History of breast lump 06/01/2015  . History of abnormal cervical Pap smear 06/01/2015    Past Surgical History:  Procedure Laterality Date  . HAMMER TOE SURGERY      Prior to Admission medications   Medication Sig Start Date End Date Taking? Authorizing Provider  albuterol (PROVENTIL HFA;VENTOLIN HFA) 108 (90 Base) MCG/ACT inhaler Inhale 2 puffs into the lungs every 6 (six) hours as needed for wheezing or shortness of breath. 01/26/17   Daymon Larsen, MD  benzonatate (TESSALON) 100 MG capsule Take 100 mg by mouth daily. 01/25/17   Historical Provider, MD  chlorpheniramine-HYDROcodone (TUSSIONEX PENNKINETIC ER) 10-8 MG/5ML SUER Take 5 mLs by mouth at bedtime as needed for cough. 01/29/17   Rudene Re, MD  fluconazole (DIFLUCAN) 150 MG tablet Take 1 tablet (150 mg total) by mouth once. 01/29/17 01/29/17  Arnetha Courser, MD  ibuprofen (ADVIL,MOTRIN) 200 MG tablet Take 200 mg by mouth every 6 (six) hours as needed. Patient takes 2 tablet every 6 hours as needed    Historical Provider, MD  levofloxacin (LEVAQUIN) 500 MG tablet Take 1 tablet (500 mg total) by mouth daily. 01/29/17 02/05/17  Arnetha Courser, MD  Spacer/Aero-Holding Chambers (AEROCHAMBER W/FLOWSIGNAL) inhaler Use as instructed 01/29/17   Arnetha Courser, MD    Allergies Patient has no known allergies.  Family History  Problem Relation Age of Onset  . Hypertension Mother   . Heart disease Mother   .  Arthritis Mother   . Depression Mother   . Stroke Maternal Grandmother     Social History Social History  Substance Use Topics  . Smoking status: Never Smoker  . Smokeless tobacco: Never Used  . Alcohol use No    Review of Systems  Constitutional: Negative for fever. Eyes: Negative for visual changes. ENT: Negative for sore  throat. Neck: No neck pain  Cardiovascular: + chest pain. Respiratory: Negative for shortness of breath. + cough and wheezing Gastrointestinal: Negative for abdominal pain, vomiting or diarrhea. Genitourinary: Negative for dysuria. Musculoskeletal: Negative for back pain. Skin: Negative for rash. Neurological: Negative for headaches, weakness or numbness. Psych: No SI or HI  ____________________________________________   PHYSICAL EXAM:  VITAL SIGNS: ED Triage Vitals  Enc Vitals Group     BP 01/29/17 1828 (!) 144/92     Pulse Rate 01/29/17 1828 96     Resp 01/29/17 1828 16     Temp 01/29/17 1828 98.6 F (37 C)     Temp Source 01/29/17 1828 Oral     SpO2 01/29/17 1828 98 %     Weight 01/29/17 1828 243 lb (110.2 kg)     Height 01/29/17 1828 5\' 6"  (1.676 m)     Head Circumference --      Peak Flow --      Pain Score 01/29/17 1840 6     Pain Loc --      Pain Edu? --      Excl. in [place]? --     Constitutional: Alert and oriented. Well appearing and in no apparent distress. HEENT:      Head: Normocephalic and atraumatic.         Eyes: Conjunctivae are normal. Sclera is non-icteric. EOMI. PERRL      Mouth/Throat: Mucous membranes are moist.       Neck: Supple with no signs of meningismus. Cardiovascular: Regular rate and rhythm. No murmurs, gallops, or rubs. 2+ symmetrical distal pulses are present in all extremities. No JVD. Respiratory: Normal respiratory effort. Normal WOB, normal sats, diffuse expiratory wheezes and crackles, moving great air. Gastrointestinal: Soft, non tender, and non distended with positive bowel sounds. No rebound or guarding. Genitourinary: No CVA tenderness. Musculoskeletal: Nontender with normal range of motion in all extremities. No edema, cyanosis, or erythema of extremities. Neurologic: Normal speech and language. Face is symmetric. Moving all extremities. No gross focal neurologic deficits are appreciated. Skin: Skin is warm, dry and intact. No  rash noted. Psychiatric: Mood and affect are normal. Speech and behavior are normal.  ____________________________________________   LABS (all labs ordered are listed, but only abnormal results are displayed)  Labs Reviewed  BASIC METABOLIC PANEL - Abnormal; Notable for the following:       Result Value   Potassium 3.3 (*)    Calcium 8.2 (*)    Anion gap 4 (*)    All other components within normal limits  CBC - Abnormal; Notable for the following:    RBC 3.38 (*)    Hemoglobin 11.0 (*)    HCT 31.8 (*)    All other components within normal limits  LACTIC ACID, PLASMA   ____________________________________________  EKG  none  ____________________________________________  RADIOLOGY  none  ____________________________________________   PROCEDURES  Procedure(s) performed: None Procedures Critical Care performed:  None ____________________________________________   INITIAL IMPRESSION / ASSESSMENT AND PLAN / ED COURSE  46 y.o. female no significant past medical history who presents from her PCPs office for concerns of worsening pneumonia on  chest x-ray. Patient clinically looks well appearing, has normal WOB, normal sats, normal vitals, afebrile. I looked at both CXR and I really think they look the same and not worsening. Labs are normal with no leukocytosis. She does have wheezing and I will give her a duoneb here. Lactic acid is pending. Will ambulate after duoneb and repeat vitals to ensure patient remains stable with no hypoxia.   Clinical Course as of Jan 29 2109  Wed Jan 29, 2017  2102 Patient remains extremely well appearing. Vitals remain normal. No oxygen requirement. Labs including lactic acid are normal. CXR looks stable when compared with 3 days ago. I do not believe at this time that patient has failed outpatient management as she feels better clinically , looks extremely well appearing, and has completely normal labs and vitals. I feel comfortable sending her  home especially since she has a PCP who she can follow up in 24 hours for recheck. Recommended that she returns to the ER if she has worsening chest pain, if she develops SOB, fever recurs, or if she has any new symptoms concerning to her. Patient is comfortable with the plan. She would like to switch to the abx that her pcp recommended and she already has a prescription for it. Will give her one dose here in the ED.   [CV]    Clinical Course User Index [CV] Rudene Re, MD    Pertinent labs & imaging results that were available during my care of the patient were reviewed by me and considered in my medical decision making (see chart for details).    ____________________________________________   FINAL CLINICAL IMPRESSION(S) / ED DIAGNOSES  Final diagnoses:  Community acquired pneumonia, unspecified laterality      NEW MEDICATIONS STARTED DURING THIS VISIT:  New Prescriptions   CHLORPHENIRAMINE-HYDROCODONE (TUSSIONEX PENNKINETIC ER) 10-8 MG/5ML SUER    Take 5 mLs by mouth at bedtime as needed for cough.     Note:  This document was prepared using Dragon voice recognition software and may include unintentional dictation errors.    Rudene Re, MD 01/29/17 2110

## 2017-01-31 ENCOUNTER — Encounter: Payer: Self-pay | Admitting: Family Medicine

## 2017-01-31 ENCOUNTER — Telehealth: Payer: Self-pay | Admitting: Family Medicine

## 2017-01-31 DIAGNOSIS — J189 Pneumonia, unspecified organism: Secondary | ICD-10-CM

## 2017-01-31 NOTE — Assessment & Plan Note (Addendum)
Recheck CXR around February 19, 2017

## 2017-01-31 NOTE — Telephone Encounter (Signed)
Pt states she is supposed to return to work on Monday but pt is not sure if she will be ready. Pt states she is doing better but does not have her normal strength. Pt also wants to know if she is still contagious? She no longer has fever. Pt is requesting a return call.

## 2017-01-31 NOTE — Telephone Encounter (Signed)
I spoke with patient She is getting better, but still trying to get her strength back No fevers now; coughing up sputum Repeat CXR in 3 weeks Out of work Monday and Tuesday, back to work on Wednesday without restrictions

## 2017-02-01 ENCOUNTER — Emergency Department: Payer: Managed Care, Other (non HMO)

## 2017-02-01 ENCOUNTER — Emergency Department
Admission: EM | Admit: 2017-02-01 | Discharge: 2017-02-01 | Disposition: A | Discharge status: Home / Self Care | Payer: Managed Care, Other (non HMO) | Attending: Emergency Medicine | Admitting: Emergency Medicine

## 2017-02-01 ENCOUNTER — Encounter: Payer: Self-pay | Admitting: Emergency Medicine

## 2017-02-01 DIAGNOSIS — J189 Pneumonia, unspecified organism: Secondary | ICD-10-CM

## 2017-02-01 DIAGNOSIS — J9801 Acute bronchospasm: Secondary | ICD-10-CM | POA: Diagnosis not present

## 2017-02-01 DIAGNOSIS — R0602 Shortness of breath: Secondary | ICD-10-CM | POA: Diagnosis present

## 2017-02-01 DIAGNOSIS — I10 Essential (primary) hypertension: Secondary | ICD-10-CM | POA: Diagnosis not present

## 2017-02-01 LAB — COMPREHENSIVE METABOLIC PANEL
ALK PHOS: 56 U/L (ref 38–126)
ALT: 24 U/L (ref 14–54)
ANION GAP: 8 (ref 5–15)
AST: 24 U/L (ref 15–41)
Albumin: 3.1 g/dL — ABNORMAL LOW (ref 3.5–5.0)
BUN: 9 mg/dL (ref 6–20)
CALCIUM: 8.9 mg/dL (ref 8.9–10.3)
CO2: 27 mmol/L (ref 22–32)
CREATININE: 0.79 mg/dL (ref 0.44–1.00)
Chloride: 103 mmol/L (ref 101–111)
Glucose, Bld: 98 mg/dL (ref 65–99)
Potassium: 3.7 mmol/L (ref 3.5–5.1)
SODIUM: 138 mmol/L (ref 135–145)
TOTAL PROTEIN: 8 g/dL (ref 6.5–8.1)
Total Bilirubin: 0.4 mg/dL (ref 0.3–1.2)

## 2017-02-01 LAB — CBC WITH DIFFERENTIAL/PLATELET
BASOS ABS: 0.1 10*3/uL (ref 0–0.1)
BASOS PCT: 1 %
EOS ABS: 0.5 10*3/uL (ref 0–0.7)
EOS PCT: 7 %
HCT: 33.5 % — ABNORMAL LOW (ref 35.0–47.0)
HEMOGLOBIN: 11.8 g/dL — AB (ref 12.0–16.0)
LYMPHS ABS: 2.5 10*3/uL (ref 1.0–3.6)
Lymphocytes Relative: 30 %
MCH: 33.1 pg (ref 26.0–34.0)
MCHC: 35.3 g/dL (ref 32.0–36.0)
MCV: 93.7 fL (ref 80.0–100.0)
Monocytes Absolute: 0.5 10*3/uL (ref 0.2–0.9)
Monocytes Relative: 7 %
NEUTROS PCT: 55 %
Neutro Abs: 4.5 10*3/uL (ref 1.4–6.5)
PLATELETS: 436 10*3/uL (ref 150–440)
RBC: 3.58 MIL/uL — AB (ref 3.80–5.20)
RDW: 13 % (ref 11.5–14.5)
WBC: 8.2 10*3/uL (ref 3.6–11.0)

## 2017-02-01 LAB — TROPONIN I

## 2017-02-01 MED ORDER — IPRATROPIUM-ALBUTEROL 0.5-2.5 (3) MG/3ML IN SOLN
3.0000 mL | Freq: Once | RESPIRATORY_TRACT | Status: AC
  Administered 2017-02-01: 3 mL via RESPIRATORY_TRACT
  Filled 2017-02-01: qty 3

## 2017-02-01 MED ORDER — PREDNISONE 10 MG PO TABS: ORAL_TABLET | ORAL | 0 refills | Status: DC

## 2017-02-01 MED ORDER — IPRATROPIUM-ALBUTEROL 0.5-2.5 (3) MG/3ML IN SOLN: 3.0000 mL | Freq: Once | RESPIRATORY_TRACT | Status: DC

## 2017-02-01 MED ORDER — PREDNISONE 20 MG PO TABS
40.0000 mg | ORAL_TABLET | Freq: Once | ORAL | Status: AC
  Administered 2017-02-01: 40 mg via ORAL
  Filled 2017-02-01: qty 2

## 2017-02-01 MED ORDER — IOPAMIDOL (ISOVUE-370) INJECTION 76%
75.0000 mL | Freq: Once | INTRAVENOUS | Status: AC | PRN
  Administered 2017-02-01: 75 mL via INTRAVENOUS

## 2017-02-01 NOTE — Discharge Instructions (Addendum)
As we discussed, it seems that your pneumonia symptoms are improving, but you are still having bad bronchospasm, also called wheezing.  Please add prednisone to help with inflammation.   Return to the emergency department immediately for any worsening trouble breathing, shortness of breath, chest pain, fever, or any other symptoms concerning to you.

## 2017-02-01 NOTE — ED Notes (Signed)
Patient transported to CT 

## 2017-02-01 NOTE — ED Notes (Signed)
Pt verbalized understanding of discharge instructions. NAD at this time. 

## 2017-02-01 NOTE — ED Provider Notes (Signed)
[facility] Emergency Department Provider Note ____________________________________________   I have reviewed the triage vital signs and the triage nursing note.  HISTORY  Chief Complaint Wheezing and Shortness of Breath   Historian Patient  HPI Lori Oliver is a 46 y.o. female with a history of recent upper respiratory infection, diagnosed with pneumonia in the emergency department over the past week, was still wheezy and followed up with her primary doctor on Wednesday and was switched to [place], and she has continued to use an albuterol inhaler but reports persistent shortness of breath and wheezing with nonproductive cough. No additional fevers. No specific chest pain. She does have an Implanon hormonal birth control.No known history of asthma, no family history of blood clots that she knows of.    Past Medical History:  Diagnosis Date  . Abnormal thyroid function test 05/16/2016  . 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 Decreased libido 05/16/2016  . Hypertension   . IFG (impaired fasting glucose) 05/16/2016  . Morbid obesity ([place]) 06/01/2015    Patient Active Problem List   Diagnosis Date Noted  . Bilateral pneumonia 01/29/2017  . Hypertension 07/11/2016  . Preventative health care 05/16/2016  . IFG (impaired fasting glucose) 05/16/2016  . 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 Abnormal thyroid function test 05/16/2016  . Decreased libido 05/16/2016  . Breast cancer screening 05/16/2016  . Breast lump on left side at 5 o'clock position 05/16/2016  . Contraception management 05/16/2016  . Surveillance for birth control, oral contraceptives 02/12/2016  . Obesity 06/01/2015  . Bilateral thoracic back pain 06/01/2015  . Blood pressure elevated without history of HTN 06/01/2015  . History of breast lump 06/01/2015  . History of abnormal cervical Pap smear 06/01/2015    Past Surgical History:  Procedure Laterality Date  . HAMMER TOE  SURGERY      Prior to Admission medications   Medication Sig Start Date End Date Taking? Authorizing Provider  albuterol (PROVENTIL HFA;VENTOLIN HFA) 108 (90 Base) MCG/ACT inhaler Inhale 2 puffs into the lungs every 6 (six) hours as needed for wheezing or shortness of breath. 01/26/17   Daymon Larsen, MD  benzonatate (TESSALON) 100 MG capsule Take 100 mg by mouth daily. 01/25/17   Historical Provider, MD  chlorpheniramine-HYDROcodone (TUSSIONEX PENNKINETIC ER) 10-8 MG/5ML SUER Take 5 mLs by mouth at bedtime as needed for cough. 01/29/17   Rudene Re, MD  ibuprofen (ADVIL,MOTRIN) 200 MG tablet Take 200 mg by mouth every 6 (six) hours as needed. Patient takes 2 tablet every 6 hours as needed    Historical Provider, MD  levofloxacin (LEVAQUIN) 500 MG tablet Take 1 tablet (500 mg total) by mouth daily. 01/29/17 02/05/17  Arnetha Courser, MD  Spacer/Aero-Holding Chambers (AEROCHAMBER W/FLOWSIGNAL) inhaler Use as instructed 01/29/17   Arnetha Courser, MD    No Known Allergies  Family History  Problem Relation Age of Onset  . Hypertension Mother   . Heart disease Mother   . Arthritis Mother   . Depression Mother   . Stroke Maternal Grandmother     Social History Social History  Substance Use Topics  . Smoking status: Never Smoker  . Smokeless tobacco: Never Used  . Alcohol use No    Review of Systems  Constitutional: Negative for fever. Eyes: Negative for visual changes. ENT: Negative for sore throat. Cardiovascular: Negative for chest pain.  Other for chest tightness especially the upper chest. Respiratory: Positive for shortness of breath. Gastrointestinal: Negative for abdominal pain, vomiting  and diarrhea. Genitourinary: Negative for dysuria. Musculoskeletal: Negative for back pain. Skin: Negative for rash. Neurological: Negative for headache. 10 point Review of Systems otherwise negative ____________________________________________   PHYSICAL EXAM:  VITAL SIGNS: ED  Triage Vitals  Enc Vitals Group     BP 02/01/17 0830 133/77     Pulse Rate 02/01/17 0830 97     Resp 02/01/17 0830 (!) 22     Temp 02/01/17 0830 98.6 F (37 C)     Temp Source 02/01/17 0830 Oral     SpO2 02/01/17 0830 98 %     Weight 02/01/17 0831 243 lb (110.2 kg)     Height 02/01/17 0831 5\' 6"  (1.676 m)     Head Circumference --      Peak Flow --      Pain Score 02/01/17 0831 0     Pain Loc --      Pain Edu? --      Excl. in [place]? --      Constitutional: Alert and oriented. Well appearing and in no distress. HEENT   Head: Normocephalic and atraumatic.      Eyes: Conjunctivae are normal. PERRL. Normal extraocular movements.      Ears:         Nose: No congestion/rhinnorhea.   Mouth/Throat: Mucous membranes are moist.   Neck: No stridor. Cardiovascular/Chest: Normal rate, regular rhythm.  No murmurs, rubs, or gallops. Respiratory: Normal respiratory effort without tachypnea nor retractions. Some decreased breath sounds throughout because with big breath she has bronchospastic wheezing cough. Gastrointestinal: Soft. No distention, no guarding, no rebound. Nontender.    Genitourinary/rectal:Deferred Musculoskeletal: Nontender with normal range of motion in all extremities. No joint effusions.  No lower extremity tenderness.  No edema. Neurologic:  Normal speech and language. No gross or focal neurologic deficits are appreciated. Skin:  Skin is warm, dry and intact. No rash noted. Psychiatric: Mood and affect are normal. Speech and behavior are normal. Patient exhibits appropriate insight and judgment.   ____________________________________________  LABS (pertinent positives/negatives)  Labs Reviewed  COMPREHENSIVE METABOLIC PANEL - Abnormal; Notable for the following:       Result Value   Albumin 3.1 (*)    All other components within normal limits  CBC WITH DIFFERENTIAL/PLATELET - Abnormal; Notable for the following:    RBC 3.58 (*)    Hemoglobin 11.8 (*)     HCT 33.5 (*)    All other components within normal limits  TROPONIN I    ____________________________________________    EKG I, Lisa Roca, MD, the attending physician have personally viewed and interpreted all ECGs.  82 bpm. Normal sinus rhythm. Narrow QRS. Normal axis. Normal ST and T-wave ____________________________________________  RADIOLOGY All Xrays were viewed by me. Imaging interpreted by Radiologist.  CT angio chest:  IMPRESSION: 1. No evidence of a pulmonary embolus. 2. Bilateral airspace lung opacities consistent with multifocal pneumonia. There is associated reactive mediastinal and left hilar adenopathy. __________________________________________  PROCEDURES  Procedure(s) performed: None  Critical Care performed: None  ____________________________________________   ED COURSE / ASSESSMENT AND PLAN  Pertinent labs & imaging results that were available during my care of the patient were reviewed by me and considered in my medical decision making (see chart for details).   Ms. Hoskins is back for reevaluation due to her respiratory symptoms after being diagnosed with pneumonia last week. She was initially started on azithromycin, but on Wednesday was switched at her doctor's office to [place] after persistent symptoms. It sounds like she  had a fair amount of bronchospasm with her first ED evaluation by review of Dr. Theodosia Paling notes and was discharged with an inhaler and she states that that does help, but it pretty much wears off.  Her symptoms today seem mostly due to the bronchospasm. However, given multiple visits for shortness of breath, I did discuss with her my concern for further investigation including rule out CT for PE, and we discussed risks versus benefit in terms of radiation exposure, and chose to proceed.  CT thankfully showed no pulmonary embolism, but the known multifocal pneumonia. From the standpoint of the pneumonia, I think she is actually  improving. She's not had any fevers. She does not have a worsening white blood cell count, nor hypoxia nor any hypotension. However, her symptoms seem to be mostly centered around the bronchospasm. I'm going to add prednisone burst. We discussed risk of tendon rupture, with the Levaquin, however she is having limiting symptoms due to the bronchospasm.  Her albuterol was written for 1-2 puffs every 6 hours, and we discussed that she could do 2 puffs and then if needed 2 puffs again, but certainly if she is needing to repeat that more than every 4 hours, I would want to see her back in the ER.  CONSULTATIONS:   None   Patient / Family / Caregiver informed of clinical course, medical decision-making process, and agree with plan.   I discussed return precautions, follow-up instructions, and discharge instructions with patient and/or family.   ___________________________________________   FINAL CLINICAL IMPRESSION(S) / ED DIAGNOSES   Final diagnoses:  Multifocal pneumonia  Bronchospasm, acute              Note: This dictation was prepared with Dragon dictation. Any transcriptional errors that result from this process are unintentional    Lisa Roca, MD 02/01/17 1236

## 2017-02-01 NOTE — ED Triage Notes (Signed)
Pt states she was dx with PNA on 2/11 was here again on 2/14 and has seen her PCP but continues to have shortness of breath and wheezing despite using Levaquin and cough syrup and tablets. Pt states she has been using ventolin inhaler as well.

## 2017-02-19 ENCOUNTER — Other Ambulatory Visit: Payer: Self-pay

## 2017-02-19 ENCOUNTER — Ambulatory Visit
Admission: RE | Admit: 2017-02-19 | Discharge: 2017-02-19 | Disposition: A | Discharge status: Home / Self Care | Payer: Managed Care, Other (non HMO) | Source: Ambulatory Visit | Attending: Family Medicine | Admitting: Family Medicine

## 2017-02-19 DIAGNOSIS — J189 Pneumonia, unspecified organism: Secondary | ICD-10-CM

## 2017-03-28 ENCOUNTER — Telehealth: Payer: Self-pay

## 2017-03-28 NOTE — Telephone Encounter (Signed)
Pt having headaches, dizziness and her blood pressure has been high lately. Readings are 170/100 and the lowest has been 150/99. Pt has not been on BP in years and she is not on any now. Pt is really concern. Pt not having any chest pains,  NO sob, no change in her diet, no blurred vision. Spoke with Dr. lada and she mention to me the best thing the pt can do is go to Urgent care since she is not having sob and chest pains. Pt sound a little not understanding she was more concern about scheduling a CPE. I stated I don't schedule appts but I think she should go ahead and get to the nearest Urgent care and call us back. Pt agreed to call us back. I don't know if she will head to Urgent care.

## 2017-04-28 ENCOUNTER — Telehealth: Payer: Self-pay | Admitting: Family Medicine

## 2017-04-28 NOTE — Telephone Encounter (Signed)
I'm sorry, but no We'll suggest either an appointment here, or at urgent care, or she can use the MyChart appointment service remotely without being seen

## 2017-04-28 NOTE — Telephone Encounter (Signed)
Think she may have sinus infection and would like to know if you could call her in an antibiotic to [facility]

## 2017-04-28 NOTE — Telephone Encounter (Signed)
Pt verbally informed and verbal

## 2017-07-07 ENCOUNTER — Encounter: Payer: Self-pay | Admitting: Family Medicine

## 2017-07-07 ENCOUNTER — Ambulatory Visit (INDEPENDENT_AMBULATORY_CARE_PROVIDER_SITE_OTHER): Payer: Managed Care, Other (non HMO) | Admitting: Family Medicine

## 2017-07-07 ENCOUNTER — Telehealth: Payer: Self-pay

## 2017-07-07 VITALS — BP 136/82 | HR 71 | Temp 97.9°F | Resp 14 | Ht 65.0 in | Wt 250.8 lb

## 2017-07-07 DIAGNOSIS — R109 Unspecified abdominal pain: Secondary | ICD-10-CM

## 2017-07-07 DIAGNOSIS — Z0000 Encounter for general adult medical examination without abnormal findings: Secondary | ICD-10-CM | POA: Diagnosis not present

## 2017-07-07 DIAGNOSIS — J189 Pneumonia, unspecified organism: Secondary | ICD-10-CM

## 2017-07-07 DIAGNOSIS — I10 Essential (primary) hypertension: Secondary | ICD-10-CM

## 2017-07-07 DIAGNOSIS — R7301 Impaired fasting glucose: Secondary | ICD-10-CM

## 2017-07-07 DIAGNOSIS — G8929 Other chronic pain: Secondary | ICD-10-CM | POA: Diagnosis not present

## 2017-07-07 DIAGNOSIS — R5383 Other fatigue: Secondary | ICD-10-CM

## 2017-07-07 DIAGNOSIS — M545 Low back pain, unspecified: Secondary | ICD-10-CM | POA: Insufficient documentation

## 2017-07-07 DIAGNOSIS — Z6841 Body Mass Index (BMI) 40.0 and over, adult: Secondary | ICD-10-CM | POA: Insufficient documentation

## 2017-07-07 DIAGNOSIS — Z1211 Encounter for screening for malignant neoplasm of colon: Secondary | ICD-10-CM

## 2017-07-07 DIAGNOSIS — N62 Hypertrophy of breast: Secondary | ICD-10-CM

## 2017-07-07 DIAGNOSIS — Z1239 Encounter for other screening for malignant neoplasm of breast: Secondary | ICD-10-CM

## 2017-07-07 LAB — POCT URINALYSIS DIPSTICK
Bilirubin, UA: NEGATIVE
Blood, UA: NEGATIVE
GLUCOSE UA: NEGATIVE
KETONES UA: NEGATIVE
Nitrite, UA: NEGATIVE
PROTEIN UA: NEGATIVE
Urobilinogen, UA: 0.2 E.U./dL
pH, UA: 6.5 (ref 5.0–8.0)

## 2017-07-07 MED ORDER — NALTREXONE-BUPROPION HCL ER 8-90 MG PO TB12: ORAL_TABLET | ORAL | 0 refills | Status: DC

## 2017-07-07 NOTE — Telephone Encounter (Signed)
Thank you for checking on this I believe the patient has decided to put this on hold for now, but she is always welcome to call back if she would like a new referral to local plastic surgery Since we talked about so many things today, would you mind just reaching out to her and confirm that she wanted to put this referral on hold? She talked about seeing someone in [place] or [place], but I think she ultimately decided to not see anyone right now

## 2017-07-07 NOTE — Assessment & Plan Note (Addendum)
Check A1c; weight loss would help slow progression to outright diabetes; she could not afford Saxenda

## 2017-07-07 NOTE — Patient Instructions (Addendum)
I'll suggest sublingual vitamin B12 500 or 1000 mcg daily I'll be glad to prescribe Contrave for weight loss Check out the information at familydoctor.org entitled "Nutrition for Weight Loss: What You Need to Know about Fad Diets" Try to lose between 1-2 pounds per week by taking in fewer calories and burning off more calories You can succeed by limiting portions, limiting foods dense in calories and fat, becoming more active, and drinking 8 glasses of water a day (64 ounces) Don't skip meals, especially breakfast, as skipping meals may alter your metabolism Do not use over-the-counter weight loss pills or gimmicks that claim rapid weight loss A healthy BMI (or body mass index) is between 18.5 and 24.9 You can calculate your ideal BMI at the [place] website [employer]fr  Think about Cologuard, the test for cancerous DNA; we recommend starting testing at age 45  Health Maintenance, Female Adopting a healthy lifestyle and getting preventive care can go a long way to promote health and wellness. Talk with your health care provider about what schedule of regular examinations is right for you. This is a good chance for you to check in with your provider about disease prevention and staying healthy. In between checkups, there are plenty of things you can do on your own. Experts have done a lot of research about which lifestyle changes and preventive measures are most likely to keep you healthy. Ask your health care provider for more information. Weight and diet Eat a healthy diet  Be sure to include plenty of vegetables, fruits, low-fat dairy products, and lean protein.  Do not eat a lot of foods high in solid fats, added sugars, or salt.  Get regular exercise. This is one of the most important things you can do for your health. ? Most adults should exercise for at least 150 minutes each week. The exercise should increase your heart rate and make  you sweat (moderate-intensity exercise). ? Most adults should also do strengthening exercises at least twice a week. This is in addition to the moderate-intensity exercise.  Maintain a healthy weight  Body mass index (BMI) is a measurement that can be used to identify possible weight problems. It estimates body fat based on height and weight. Your health care provider can help determine your BMI and help you achieve or maintain a healthy weight.  For females 45 years of age and older: ? A BMI below 18.5 is considered underweight. ? A BMI of 18.5 to 24.9 is normal. ? A BMI of 25 to 29.9 is considered overweight. ? A BMI of 30 and above is considered obese.  Watch levels of cholesterol and blood lipids  You should start having your blood tested for lipids and cholesterol at 45 years of age, then have this test every 5 years.  You may need to have your cholesterol levels checked more often if: ? Your lipid or cholesterol levels are high. ? You are older than 45 years of age. ? You are at high risk for heart disease.  Cancer screening Lung Cancer  Lung cancer screening is recommended for adults 18-59 years old who are at high risk for lung cancer because of a history of smoking.  A yearly low-dose CT scan of the lungs is recommended for people who: ? Currently smoke. ? Have quit within the past 15 years. ? Have at least a 30-pack-year history of smoking. A pack year is smoking an average of one pack of cigarettes a day for 1 year.  Yearly screening  should continue until it has been 15 years since you quit.  Yearly screening should stop if you develop a health problem that would prevent you from having lung cancer treatment.  Breast Cancer  Practice breast self-awareness. This means understanding how your breasts normally appear and feel.  It also means doing regular breast self-exams. Let your health care provider know about any changes, no matter how small.  If you are in your  20s or 30s, you should have a clinical breast exam (CBE) by a health care provider every 1-3 years as part of a regular health exam.  If you are 80 or older, have a CBE every year. Also consider having a breast X-ray (mammogram) every year.  If you have a family history of breast cancer, talk to your health care provider about genetic screening.  If you are at high risk for breast cancer, talk to your health care provider about having an MRI and a mammogram every year.  Breast cancer gene (BRCA) assessment is recommended for women who have family members with BRCA-related cancers. BRCA-related cancers include: ? Breast. ? Ovarian. ? Tubal. ? Peritoneal cancers.  Results of the assessment will determine the need for genetic counseling and BRCA1 and BRCA2 testing.  Cervical Cancer Your health care provider may recommend that you be screened regularly for cancer of the pelvic organs (ovaries, uterus, and vagina). This screening involves a pelvic examination, including checking for microscopic changes to the surface of your cervix (Pap test). You may be encouraged to have this screening done every 3 years, beginning at age 11.  For women ages 38-65, health care providers may recommend pelvic exams and Pap testing every 3 years, or they may recommend the Pap and pelvic exam, combined with testing for human papilloma virus (HPV), every 5 years. Some types of HPV increase your risk of cervical cancer. Testing for HPV may also be done on women of any age with unclear Pap test results.  Other health care providers may not recommend any screening for nonpregnant women who are considered low risk for pelvic cancer and who do not have symptoms. Ask your health care provider if a screening pelvic exam is right for you.  If you have had past treatment for cervical cancer or a condition that could lead to cancer, you need Pap tests and screening for cancer for at least 20 years after your treatment. If Pap  tests have been discontinued, your risk factors (such as having a new sexual partner) need to be reassessed to determine if screening should resume. Some women have medical problems that increase the chance of getting cervical cancer. In these cases, your health care provider may recommend more frequent screening and Pap tests.  Colorectal Cancer  This type of cancer can be detected and often prevented.  Routine colorectal cancer screening usually begins at 45 years of age and continues through 45 years of age.  Your health care provider may recommend screening at an earlier age if you have risk factors for colon cancer.  Your health care provider may also recommend using home test kits to check for hidden blood in the stool.  A small camera at the end of a tube can be used to examine your colon directly (sigmoidoscopy or colonoscopy). This is done to check for the earliest forms of colorectal cancer.  Routine screening usually begins at age 44.  Direct examination of the colon should be repeated every 5-10 years through 45 years of age. However, you  may need to be screened more often if early forms of precancerous polyps or small growths are found.  Skin Cancer  Check your skin from head to toe regularly.  Tell your health care provider about any new moles or changes in moles, especially if there is a change in a mole's shape or color.  Also tell your health care provider if you have a mole that is larger than the size of a pencil eraser.  Always use sunscreen. Apply sunscreen liberally and repeatedly throughout the day.  Protect yourself by wearing long sleeves, pants, a wide-brimmed hat, and sunglasses whenever you are outside.  Heart disease, diabetes, and high blood pressure  High blood pressure causes heart disease and increases the risk of stroke. High blood pressure is more likely to develop in: ? People who have blood pressure in the high end of the normal range  (130-139/85-89 mm Hg). ? People who are overweight or obese. ? People who are African American.  If you are 77-33 years of age, have your blood pressure checked every 3-5 years. If you are 84 years of age or older, have your blood pressure checked every year. You should have your blood pressure measured twice-once when you are at a hospital or clinic, and once when you are not at a hospital or clinic. Record the average of the two measurements. To check your blood pressure when you are not at a hospital or clinic, you can use: ? An automated blood pressure machine at a pharmacy. ? A home blood pressure monitor.  If you are between 15 years and 47 years old, ask your health care provider if you should take aspirin to prevent strokes.  Have regular diabetes screenings. This involves taking a blood sample to check your fasting blood sugar level. ? If you are at a normal weight and have a low risk for diabetes, have this test once every three years after 45 years of age. ? If you are overweight and have a high risk for diabetes, consider being tested at a younger age or more often. Preventing infection Hepatitis B  If you have a higher risk for hepatitis B, you should be screened for this virus. You are considered at high risk for hepatitis B if: ? You were born in a country where hepatitis B is common. Ask your health care provider which countries are considered high risk. ? Your parents were born in a high-risk country, and you have not been immunized against hepatitis B (hepatitis B vaccine). ? You have HIV or AIDS. ? You use needles to inject street drugs. ? You live with someone who has hepatitis B. ? You have had sex with someone who has hepatitis B. ? You get hemodialysis treatment. ? You take certain medicines for conditions, including cancer, organ transplantation, and autoimmune conditions.  Hepatitis C  Blood testing is recommended for: ? Everyone born from [birth year] through  1964. ? Anyone with known risk factors for hepatitis C.  Sexually transmitted infections (STIs)  You should be screened for sexually transmitted infections (STIs) including gonorrhea and chlamydia if: ? You are sexually active and are younger than 45 years of age. ? You are older than 45 years of age and your health care provider tells you that you are at risk for this type of infection. ? Your sexual activity has changed since you were last screened and you are at an increased risk for chlamydia or gonorrhea. Ask your health care provider if you are  at risk.  If you do not have HIV, but are at risk, it may be recommended that you take a prescription medicine daily to prevent HIV infection. This is called pre-exposure prophylaxis (PrEP). You are considered at risk if: ? You are sexually active and do not regularly use condoms or know the HIV status of your partner(s). ? You take drugs by injection. ? You are sexually active with a partner who has HIV.  Talk with your health care provider about whether you are at high risk of being infected with HIV. If you choose to begin PrEP, you should first be tested for HIV. You should then be tested every 3 months for as long as you are taking PrEP. Pregnancy  If you are premenopausal and you may become pregnant, ask your health care provider about preconception counseling.  If you may become pregnant, take 400 to 800 micrograms (mcg) of folic acid every day.  If you want to prevent pregnancy, talk to your health care provider about birth control (contraception). Osteoporosis and menopause  Osteoporosis is a disease in which the bones lose minerals and strength with aging. This can result in serious bone fractures. Your risk for osteoporosis can be identified using a bone density scan.  If you are 66 years of age or older, or if you are at risk for osteoporosis and fractures, ask your health care provider if you should be screened.  Ask your health  care provider whether you should take a calcium or vitamin D supplement to lower your risk for osteoporosis.  Menopause may have certain physical symptoms and risks.  Hormone replacement therapy may reduce some of these symptoms and risks. Talk to your health care provider about whether hormone replacement therapy is right for you. Follow these instructions at home:  Schedule regular health, dental, and eye exams.  Stay current with your immunizations.  Do not use any tobacco products including cigarettes, chewing tobacco, or electronic cigarettes.  If you are pregnant, do not drink alcohol.  If you are breastfeeding, limit how much and how often you drink alcohol.  Limit alcohol intake to no more than 1 drink per day for nonpregnant women. One drink equals 12 ounces of beer, 5 ounces of wine, or 1 ounces of hard liquor.  Do not use street drugs.  Do not share needles.  Ask your health care provider for help if you need support or information about quitting drugs.  Tell your health care provider if you often feel depressed.  Tell your health care provider if you have ever been abused or do not feel safe at home. This information is not intended to replace advice given to you by your health care provider. Make sure you discuss any questions you have with your health care provider. Document Released: 06/17/2011 Document Revised: 05/09/2016 Document Reviewed: 09/05/2015 Elsevier Interactive Patient Education  [employer].

## 2017-07-07 NOTE — Assessment & Plan Note (Signed)
Explained recommendations are to start at age 45 for persons of African heritage; discussed colonoscopy and Cologuard; she declined both at this time, but took a Soil scientist; she is welcome to call back if she'd like me to order that and she can do that in her own home

## 2017-07-07 NOTE — Assessment & Plan Note (Signed)
Asked my staff to check again on the referral to plastic surgeon; patient decided to pass on this for right now; did not want to go to [facility], had hoped to see someone locally; we'll certainly be glad to put in another referral or get her an appointment when she is ready to pursue this

## 2017-07-07 NOTE — Assessment & Plan Note (Signed)
Controlled today; weight loss would help control this better

## 2017-07-07 NOTE — Assessment & Plan Note (Signed)
USPSTF grade A and B recommendations reviewed with patient; age-appropriate recommendations, preventive care, screening tests, etc discussed and encouraged; healthy living encouraged; see AVS for patient education given to patient  

## 2017-07-07 NOTE — Assessment & Plan Note (Signed)
Encouraged adequate water intake; see AVS; discussed medicines Contrave and Belviq; will Rx Contrave, savings card given; she also wishes for referral to bariatric surgery; I explained that I do not prescribe plain phentermine for weight loss; I recommended sublingual vitamin B12

## 2017-07-07 NOTE — Progress Notes (Signed)
Patient ID: Lori Oliver, female   DOB: 07-10-1971, 46 y.o.   MRN: [id number]   Subjective:   Lori Oliver is a 46 y.o. female here for a complete physical exam  She is having other issues today She talked about medicine for weight loss; she is frustrated about her weight; this is the most she has ever weighed, even when she was pregnant; she is not SI/HI, but just down and frustrated about her weight We have talked about weight loss before; she has tried other things; she would like to see bariatric clinic; at the point where she wants to do something; not as much energy; the Saxenda was $1000 for the prescription and she just couldn't afford that  She is having pain in her lower back; over a month; lower back just on the left side Knows that it's not normal; worse when laying down; same bed; no blood in the urine; no fevers No pain with any movement; constant and nagging; eases up from time to time Never had a kidney stones No unusual odor; no burning with urination No fam hx of kidney stones or gout; no fam hx of back problems; not sure if just the heaviness from the breasts Has had back pain before; wears pocket book on the lft side, but not wearing all the time; uses computer and pulling residents  Does not think it's a pulled muscle Not tried tylenol  Plastics referral being checked today again for breast reduction surgery  She had pneumonia; she is feeling fine; no cough, no hemoptysis, no fevers, no weight loss; she decided to not go back for the f/u CXR  She has prediabetes in her medical hx; hoping to lose weight  --------------------------------------------------  USPSTF grade A and B recommendations Depression:  Depression screen [facility] 2/9 07/07/2017 01/29/2017 09/24/2016 05/16/2016 06/01/2015  Decreased Interest 0 0 0 0 0  Down, Depressed, Hopeless 1 0 1 1 0  PHQ - 2 Score 1 0 1 1 0  not SI/HI, think weight related  Hypertension: BP Readings from Last 3 Encounters:   07/07/17 136/82  02/01/17 (!) 147/99  01/29/17 138/88   Obesity: gaining, discussed, will try to drink more water Wt Readings from Last 3 Encounters:  07/07/17 250 lb 12.8 oz (113.8 kg)  02/01/17 243 lb (110.2 kg)  01/29/17 243 lb (110.2 kg)   BMI Readings from Last 3 Encounters:  07/07/17 41.74 kg/m  02/01/17 39.22 kg/m  01/29/17 39.22 kg/m    Alcohol: no Tobacco use: no HIV, hep B, hep C: not interested STD testing and prevention (chl/gon/syphilis): not interested Intimate partner violence: no abuse Breast cancer: has something growing under left breast, maybe skin tag, no bleeding, no bothering her; 6 months ago, getting bigger Saw Dr. Bary Castilla for breast lump Notes Recorded by [name], MD on 09/28/2014 at 3:21 PM Mammogram identified a 5 mm lesion. Pathology shows a 4 mm fibroadenoma. Postbiopsy mammograms confirm the clip at the previous nodule site. We'll arrange for a followup mammogram (bilateral) in 6 months with a final clinical exam to follow.  BRCA gene screening: no fam hx of breast/ovarian cancer Cervical cancer screening: went to Dr. Marcelline Mates; has nexplanon, irregular spotting; last two have been normal Osteoporosis: no steroids Fall prevention/vitamin D: not taking extra D right now Lipids:  Lab Results  Component Value Date   CHOL 167 05/17/2016   CHOL 165 03/09/2014   Lab Results  Component Value Date   HDL 68 05/17/2016  HDL 60 03/09/2014   Lab Results  Component Value Date   LDLCALC 83 05/17/2016   LDLCALC 86 03/09/2014   Lab Results  Component Value Date   TRIG 82 05/17/2016   TRIG 96 03/09/2014   No results found for: CHOLHDL No results found for: LDLDIRECT Glucose: cheeseburger and baked beans lunch a few hours ago  Glucose  Date Value Ref Range Status  03/09/2014 86 65 - 99 mg/dL Final   Glucose, Bld  Date Value Ref Range Status  02/01/2017 98 65 - 99 mg/dL Final  01/29/2017 97 65 - 99 mg/dL Final  01/26/2017 157 (H)  65 - 99 mg/dL Final   Colorectal cancer: no fam hx; discussed starting at age 45 for African heritage Lung cancer:  n/a AAA: n/a Aspirin: n/a Diet: discussed, 3 servings of calcium a day, 5 servings of fruits and veggies daily Exercise: not enough she says Skin cancer: under the breast, will be checked today  Past Medical History:  Diagnosis Date  . Abnormal thyroid function test 05/16/2016  . 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 Decreased libido 05/16/2016  . Hypertension   . IFG (impaired fasting glucose) 05/16/2016  . Morbid obesity ([place]) 06/01/2015   Past Surgical History:  Procedure Laterality Date  . HAMMER TOE SURGERY     Family History  Problem Relation Age of Onset  . Hypertension Mother   . Heart disease Mother   . Arthritis Mother   . Depression Mother   . Stroke Maternal Grandmother    Social History  Substance Use Topics  . Smoking status: Never Smoker  . Smokeless tobacco: Never Used  . Alcohol use No   Review of Systems  Objective:   Vitals:   07/07/17 1410  BP: 136/82  Pulse: 71  Resp: 14  Temp: 97.9 F (36.6 C)  TempSrc: Oral  SpO2: 99%  Weight: 250 lb 12.8 oz (113.8 kg)  Height: '5\' 5"'  (1.651 m)   Body mass index is 41.74 kg/m. Wt Readings from Last 3 Encounters:  07/07/17 250 lb 12.8 oz (113.8 kg)  02/01/17 243 lb (110.2 kg)  01/29/17 243 lb (110.2 kg)   Physical Exam  Constitutional: She appears well-developed and well-nourished.  HENT:  Head: Normocephalic and atraumatic.  Right Ear: Hearing, tympanic membrane, external ear and ear canal normal.  Left Ear: Hearing, tympanic membrane, external ear and ear canal normal.  Eyes: Conjunctivae and EOM are normal. Right eye exhibits no hordeolum. Left eye exhibits no hordeolum. No scleral icterus.  Neck: Carotid bruit is not present. No thyromegaly present.  Cardiovascular: Normal rate, regular rhythm, S1 normal, S2 normal and normal heart sounds.   No extrasystoles are present.   Pulmonary/Chest: Effort normal and breath sounds normal. No respiratory distress. Right breast exhibits no inverted nipple, no mass, no nipple discharge, no skin change and no tenderness. Left breast exhibits no inverted nipple, no mass, no nipple discharge, no skin change and no tenderness. Breasts are symmetrical.  Large pendulous breasts; 1x1x2 mm skin tag under LEFT breast  Abdominal: Soft. Normal appearance and bowel sounds are normal. She exhibits no distension, no abdominal bruit, no pulsatile midline mass and no mass. There is no hepatosplenomegaly. There is no tenderness. No hernia.  Musculoskeletal: Normal range of motion. She exhibits no edema.       Lumbar back: She exhibits normal range of motion, no tenderness, no bony tenderness, no swelling, no edema, no deformity and no spasm.  Lymphadenopathy:  Head (right side): No submandibular adenopathy present.       Head (left side): No submandibular adenopathy present.    She has no cervical adenopathy.    She has no axillary adenopathy.  Neurological: She is alert. She displays no tremor. No cranial nerve deficit. She exhibits normal muscle tone. Gait normal.  Reflex Scores:      Patellar reflexes are 2+ on the right side and 2+ on the left side. Lower extremity strength 5/5  Skin: Skin is warm and dry. No bruising and no ecchymosis noted. No cyanosis. No pallor.  Psychiatric: Her speech is normal and behavior is normal. Thought content normal. Her mood appears not anxious. She does not exhibit a depressed mood.    Assessment/Plan:   Problem List Items Addressed This Visit      Cardiovascular and Mediastinum   Hypertension    Controlled today; weight loss would help control this better        Respiratory   Bilateral pneumonia    Patient opted to not get f/u CXR; denies symptoms        Endocrine   IFG (impaired fasting glucose)    Check A1c; weight loss would help slow progression to outright diabetes; she could not  afford Saxenda      Relevant Orders   Hemoglobin A1c     Other   Screen for colon cancer    Explained recommendations are to start at age 18 for persons of African heritage; discussed colonoscopy and Cologuard; she declined both at this time, but took a Soil scientist; she is welcome to call back if she'd like me to order that and she can do that in her own home      Preventative health care - Primary    USPSTF grade A and B recommendations reviewed with patient; age-appropriate recommendations, preventive care, screening tests, etc discussed and encouraged; healthy living encouraged; see AVS for patient education given to patient       Relevant Orders   Comprehensive metabolic panel   CBC with Differential/Platelet   TSH   Lipid panel   Morbid obesity ([place])    Encouraged adequate water intake; see AVS; discussed medicines Contrave and Belviq; will Rx Contrave, savings card given; she also wishes for referral to bariatric surgery; I explained that I do not prescribe plain phentermine for weight loss; I recommended sublingual vitamin B12      Relevant Medications   Naltrexone-Bupropion HCl ER 8-90 MG TB12   Other Relevant Orders   Amb Referral to Bariatric Surgery   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    Asked my staff to check again on the referral to plastic surgeon; patient decided to pass on this for right now; did not want to go to [facility], had hoped to see someone locally; we'll certainly be glad to put in another referral or get her an appointment when she is ready to pursue this       Other Visit Diagnoses    Screening for breast cancer       Relevant Orders   MM Digital Screening   Other fatigue       Relevant Orders   VITAMIN D 25 Hydroxy (Vit-D Deficiency, Fractures)   Left flank pain, chronic       Relevant Orders   POCT Urinalysis Dipstick (Completed)   Urine Culture      Meds ordered this encounter  Medications  . Naltrexone-Bupropion HCl ER 8-90 MG TB12     Sig:  One by mouth daily x 1 week, then 1 pill BID x 1 week, then 2 pills in AM and 1 pill in PM x 1 week, then 2 pills BID    Dispense:  78 tablet    Refill:  0   Orders Placed This Encounter  Procedures  . Urine Culture  . MM Digital Screening    Standing Status:   Future    Standing Expiration Date:   09/07/2018    Order Specific Question:   Reason for Exam (SYMPTOM  OR DIAGNOSIS REQUIRED)    Answer:   screen for breast cancer    Order Specific Question:   Is the patient pregnant?    Answer:   No    Order Specific Question:   Preferred imaging location?    Answer:   [facility]  . VITAMIN D 25 Hydroxy (Vit-D Deficiency, Fractures)  . Comprehensive metabolic panel    Order Specific Question:   Has the patient fasted?    Answer:   No  . CBC with Differential/Platelet  . TSH  . Lipid panel    Order Specific Question:   Has the patient fasted?    Answer:   No  . Hemoglobin A1c  . Amb Referral to Bariatric Surgery    Referral Priority:   Routine    Referral Type:   Consultation    Number of Visits Requested:   1  . POCT Urinalysis Dipstick    Follow up plan: Return in about 1 year (around 07/07/2018) for complete physical; 8 weeks after starting medicine.  An After Visit Summary was printed and given to the patient.

## 2017-07-07 NOTE — Assessment & Plan Note (Signed)
Patient opted to not get f/u CXR; denies symptoms

## 2017-07-07 NOTE — Telephone Encounter (Signed)
I called Dr. Edwin Dada office to check the status of this patient's referral and they said that they had it but had to look into the reason why it was not scheduled.   Kendall, from [facility], called me back stating that Dr. Tula Nakayama does not like to operate on people whose BMI is greater than 32 due to it becoming a safety hazard for the patient.  She stated that she will reach out to the patient and that she will send [place] some information.

## 2017-07-08 ENCOUNTER — Other Ambulatory Visit: Payer: Self-pay | Admitting: Family Medicine

## 2017-07-08 DIAGNOSIS — E559 Vitamin D deficiency, unspecified: Secondary | ICD-10-CM | POA: Insufficient documentation

## 2017-07-08 LAB — CBC WITH DIFFERENTIAL/PLATELET
BASOS ABS: 0 10*3/uL (ref 0.0–0.2)
BASOS: 0 %
EOS (ABSOLUTE): 0.2 10*3/uL (ref 0.0–0.4)
Eos: 3 %
Hematocrit: 37 % (ref 34.0–46.6)
Hemoglobin: 12.2 g/dL (ref 11.1–15.9)
IMMATURE GRANS (ABS): 0 10*3/uL (ref 0.0–0.1)
Immature Granulocytes: 0 %
LYMPHS ABS: 2.9 10*3/uL (ref 0.7–3.1)
LYMPHS: 38 %
MCH: 32 pg (ref 26.6–33.0)
MCHC: 33 g/dL (ref 31.5–35.7)
MCV: 97 fL (ref 79–97)
Monocytes Absolute: 0.4 10*3/uL (ref 0.1–0.9)
Monocytes: 6 %
Neutrophils Absolute: 4 10*3/uL (ref 1.4–7.0)
Neutrophils: 53 %
PLATELETS: 262 10*3/uL (ref 150–379)
RBC: 3.81 x10E6/uL (ref 3.77–5.28)
RDW: 13.1 % (ref 12.3–15.4)
WBC: 7.6 10*3/uL (ref 3.4–10.8)

## 2017-07-08 LAB — COMPREHENSIVE METABOLIC PANEL
A/G RATIO: 1.2 (ref 1.2–2.2)
ALBUMIN: 4.1 g/dL (ref 3.5–5.5)
ALT: 14 IU/L (ref 0–32)
AST: 16 IU/L (ref 0–40)
Alkaline Phosphatase: 74 IU/L (ref 39–117)
BILIRUBIN TOTAL: 0.3 mg/dL (ref 0.0–1.2)
BUN/Creatinine Ratio: 15 (ref 9–23)
BUN: 13 mg/dL (ref 6–24)
CALCIUM: 8.9 mg/dL (ref 8.7–10.2)
CHLORIDE: 102 mmol/L (ref 96–106)
CO2: 23 mmol/L (ref 20–29)
Creatinine, Ser: 0.88 mg/dL (ref 0.57–1.00)
GFR, EST AFRICAN AMERICAN: 92 mL/min/{1.73_m2} (ref >59)
GFR, EST NON AFRICAN AMERICAN: 80 mL/min/{1.73_m2} (ref >59)
GLOBULIN, TOTAL: 3.4 g/dL (ref 1.5–4.5)
Glucose: 93 mg/dL (ref 65–99)
POTASSIUM: 4.1 mmol/L (ref 3.5–5.2)
Sodium: 139 mmol/L (ref 134–144)
TOTAL PROTEIN: 7.5 g/dL (ref 6.0–8.5)

## 2017-07-08 LAB — LIPID PANEL
CHOL/HDL RATIO: 3.6 ratio (ref 0.0–4.4)
Cholesterol, Total: 177 mg/dL (ref 100–199)
HDL: 49 mg/dL (ref >39)
LDL Calculated: 101 mg/dL — ABNORMAL HIGH (ref 0–99)
Triglycerides: 134 mg/dL (ref 0–149)
VLDL CHOLESTEROL CAL: 27 mg/dL (ref 5–40)

## 2017-07-08 LAB — HEMOGLOBIN A1C
ESTIMATED AVERAGE GLUCOSE: 108 mg/dL
HEMOGLOBIN A1C: 5.4 % (ref 4.8–5.6)

## 2017-07-08 LAB — VITAMIN D 25 HYDROXY (VIT D DEFICIENCY, FRACTURES): VIT D 25 HYDROXY: 9.9 ng/mL — AB (ref 30.0–100.0)

## 2017-07-08 LAB — TSH: TSH: 1.73 u[IU]/mL (ref 0.450–4.500)

## 2017-07-08 MED ORDER — VITAMIN D (ERGOCALCIFEROL) 1.25 MG (50000 UNIT) PO CAPS: 50000.0000 [IU] | ORAL_CAPSULE | ORAL | 1 refills | Status: AC

## 2017-07-08 NOTE — Telephone Encounter (Signed)
I tried to contact this patient to make sure she did not want to proceed with the referrals that has been placed but there was no answer. A message was left for her to give [place] a call back letting us know what to do.

## 2017-07-08 NOTE — Telephone Encounter (Signed)
This patient called me back and said that she wanted to hold off on the plastic surgery and that they did contact her, but she did want to proceed with the bariatric surgery referral.  She also stated that she wanted Dr. Sanda Klein to give her a call since the medication that was sent in on yesterday is $178 with insurance.

## 2017-07-08 NOTE — Progress Notes (Signed)
Start once weekly vit D x 8 weeks, then once a month vit D for several months thereafter; can check level in 4-6 months

## 2017-07-08 NOTE — Telephone Encounter (Signed)
I left detailed message; not sure if she tried the savings card or if she wanted to take the medicine even with that cost; I am here to help if she wants to call, try something else, discuss; just let me know

## 2017-07-11 LAB — URINE CULTURE: Organism ID, Bacteria: NO GROWTH

## 2017-07-16 ENCOUNTER — Telehealth: Payer: Self-pay | Admitting: Family Medicine

## 2017-07-16 NOTE — Telephone Encounter (Signed)
Pt states she was in for a CPE and she has a question. Please return her call.

## 2017-07-18 ENCOUNTER — Telehealth: Payer: Self-pay

## 2017-07-18 NOTE — Telephone Encounter (Signed)
Pt has not herd anything from [facility]. Saw that [name] faxed everything over to them on 07/09/2017. Tried calling their office but it states I've reach the after hours messaged. On the Website it states they are open today  until 5:00pm. Since I could not get in contact with them asked [name] to refax everything again.

## 2017-07-21 NOTE — Telephone Encounter (Signed)
No additional notes needed  

## 2017-07-23 ENCOUNTER — Ambulatory Visit
Admission: RE | Admit: 2017-07-23 | Discharge: 2017-07-23 | Disposition: A | Discharge status: Home / Self Care | Payer: Managed Care, Other (non HMO) | Source: Ambulatory Visit | Attending: Family Medicine | Admitting: Family Medicine

## 2017-07-23 DIAGNOSIS — Z1231 Encounter for screening mammogram for malignant neoplasm of breast: Secondary | ICD-10-CM | POA: Insufficient documentation

## 2017-07-23 DIAGNOSIS — Z1239 Encounter for other screening for malignant neoplasm of breast: Secondary | ICD-10-CM

## 2017-09-10 ENCOUNTER — Telehealth: Payer: Self-pay | Admitting: Family Medicine

## 2017-09-10 NOTE — Telephone Encounter (Signed)
Pt requesting return call. She would like to discuss her vitamin d prescription. States that she forgot to take the vitamin and when she remembered she took it for 3 weeks then another script was called in. She would like to know if she should continue taking the vitamin d [phone]

## 2017-09-12 MED ORDER — VITAMIN D (ERGOCALCIFEROL) 1.25 MG (50000 UNIT) PO CAPS: 50000.0000 [IU] | ORAL_CAPSULE | ORAL | 0 refills | Status: DC

## 2017-09-12 NOTE — Telephone Encounter (Signed)
I talked with patient; we'll do vit D weekly for 4 more weeks, then once a month (she doesn't think she can remember to do daily 1,000 iu  She thinks she is gaining weight from nexplanon; she will talk to Dr. Marcelline Mates about other options, even suggested a BTL for definitive non-hormonal contraception Not a candidate for breast reduction because of her obesity She did hear about bariatric surgeon, but did not get the online test information written down; she still needs to do that; she was driving and needs the bariatric surgeon's number to call back STAFF -- please contact patient on Monday and give her Dr. Bernette Mayers office number for bariatric referral (I added it to the Care Team); thank you

## 2017-09-15 NOTE — Telephone Encounter (Signed)
Patient was given the number to the [facility] location for Dr. Arletta Bale [phone].

## 2017-10-16 ENCOUNTER — Telehealth: Payer: Self-pay | Admitting: Family Medicine

## 2017-10-16 MED ORDER — VITAMIN D (ERGOCALCIFEROL) 1.25 MG (50000 UNIT) PO CAPS: 50000.0000 [IU] | ORAL_CAPSULE | ORAL | 5 refills | Status: DC

## 2017-10-16 NOTE — Telephone Encounter (Signed)
-----   Message from Arnetha Courser, MD sent at 09/12/2017  5:07 PM EDT ----- Regarding: send new Rx one a month Start 50k once a month

## 2017-12-01 ENCOUNTER — Other Ambulatory Visit: Payer: Self-pay

## 2017-12-01 ENCOUNTER — Emergency Department: Payer: Managed Care, Other (non HMO)

## 2017-12-01 ENCOUNTER — Observation Stay
Admission: EM | Admit: 2017-12-01 | Discharge: 2017-12-02 | Disposition: A | Discharge status: Home / Self Care | Payer: Managed Care, Other (non HMO) | Attending: Internal Medicine | Admitting: Internal Medicine

## 2017-12-01 ENCOUNTER — Encounter: Payer: Self-pay | Admitting: Emergency Medicine

## 2017-12-01 DIAGNOSIS — I10 Essential (primary) hypertension: Secondary | ICD-10-CM | POA: Diagnosis not present

## 2017-12-01 DIAGNOSIS — E039 Hypothyroidism, unspecified: Secondary | ICD-10-CM | POA: Diagnosis not present

## 2017-12-01 DIAGNOSIS — I4891 Unspecified atrial fibrillation: Principal | ICD-10-CM | POA: Diagnosis present

## 2017-12-01 DIAGNOSIS — Z6841 Body Mass Index (BMI) 40.0 and over, adult: Secondary | ICD-10-CM | POA: Diagnosis not present

## 2017-12-01 DIAGNOSIS — Z79899 Other long term (current) drug therapy: Secondary | ICD-10-CM | POA: Diagnosis not present

## 2017-12-01 LAB — CBC WITH DIFFERENTIAL/PLATELET
Basophils Absolute: 0.1 10*3/uL (ref 0–0.1)
Basophils Relative: 1 %
EOS PCT: 3 %
Eosinophils Absolute: 0.3 10*3/uL (ref 0–0.7)
HCT: 40.1 % (ref 35.0–47.0)
HEMOGLOBIN: 13.9 g/dL (ref 12.0–16.0)
LYMPHS PCT: 51 %
Lymphs Abs: 5.1 10*3/uL — ABNORMAL HIGH (ref 1.0–3.6)
MCH: 32.9 pg (ref 26.0–34.0)
MCHC: 34.7 g/dL (ref 32.0–36.0)
MCV: 94.7 fL (ref 80.0–100.0)
Monocytes Absolute: 0.8 10*3/uL (ref 0.2–0.9)
Monocytes Relative: 8 %
Neutro Abs: 3.7 10*3/uL (ref 1.4–6.5)
Neutrophils Relative %: 37 %
PLATELETS: 262 10*3/uL (ref 150–440)
RBC: 4.24 MIL/uL (ref 3.80–5.20)
RDW: 12.8 % (ref 11.5–14.5)
WBC: 10.1 10*3/uL (ref 3.6–11.0)

## 2017-12-01 LAB — HCG, QUANTITATIVE, PREGNANCY: hCG, Beta Chain, Quant, S: 1 m[IU]/mL (ref <5)

## 2017-12-01 LAB — GLUCOSE, CAPILLARY: Glucose-Capillary: 126 mg/dL — ABNORMAL HIGH (ref 65–99)

## 2017-12-01 LAB — URINALYSIS, COMPLETE (UACMP) WITH MICROSCOPIC
BILIRUBIN URINE: NEGATIVE
Glucose, UA: NEGATIVE mg/dL
KETONES UR: NEGATIVE mg/dL
NITRITE: NEGATIVE
PROTEIN: NEGATIVE mg/dL
Specific Gravity, Urine: 1.002 — ABNORMAL LOW (ref 1.005–1.030)
pH: 7 (ref 5.0–8.0)

## 2017-12-01 LAB — COMPREHENSIVE METABOLIC PANEL
ALBUMIN: 3.9 g/dL (ref 3.5–5.0)
ALK PHOS: 78 U/L (ref 38–126)
ALT: 16 U/L (ref 14–54)
ANION GAP: 11 (ref 5–15)
AST: 25 U/L (ref 15–41)
BUN: 12 mg/dL (ref 6–20)
CHLORIDE: 104 mmol/L (ref 101–111)
CO2: 22 mmol/L (ref 22–32)
Calcium: 8.9 mg/dL (ref 8.9–10.3)
Creatinine, Ser: 0.88 mg/dL (ref 0.44–1.00)
GFR calc non Af Amer: >60 mL/min (ref >60)
GLUCOSE: 118 mg/dL — AB (ref 65–99)
Potassium: 3.5 mmol/L (ref 3.5–5.1)
SODIUM: 137 mmol/L (ref 135–145)
Total Bilirubin: 0.6 mg/dL (ref 0.3–1.2)
Total Protein: 8 g/dL (ref 6.5–8.1)

## 2017-12-01 LAB — URINE DRUG SCREEN, QUALITATIVE (ARMC ONLY)
AMPHETAMINES, UR SCREEN: NOT DETECTED
Barbiturates, Ur Screen: NOT DETECTED
Benzodiazepine, Ur Scrn: NOT DETECTED
COCAINE METABOLITE, UR ~~LOC~~: NOT DETECTED
Cannabinoid 50 Ng, Ur ~~LOC~~: NOT DETECTED
MDMA (ECSTASY) UR SCREEN: NOT DETECTED
Methadone Scn, Ur: NOT DETECTED
Opiate, Ur Screen: NOT DETECTED
Phencyclidine (PCP) Ur S: NOT DETECTED
TRICYCLIC, UR SCREEN: NOT DETECTED

## 2017-12-01 LAB — TROPONIN I: Troponin I: <0.03 ng/mL (ref <0.03)

## 2017-12-01 LAB — ETHANOL: Alcohol, Ethyl (B): <10 mg/dL (ref <10)

## 2017-12-01 LAB — MRSA PCR SCREENING: MRSA by PCR: NEGATIVE

## 2017-12-01 LAB — BRAIN NATRIURETIC PEPTIDE: B NATRIURETIC PEPTIDE 5: 26 pg/mL (ref 0.0–100.0)

## 2017-12-01 LAB — TSH: TSH: 4.663 u[IU]/mL — AB (ref 0.350–4.500)

## 2017-12-01 MED ORDER — ACETAMINOPHEN 325 MG PO TABS: 650.0000 mg | ORAL_TABLET | Freq: Four times a day (QID) | ORAL | Status: DC | PRN

## 2017-12-01 MED ORDER — ONDANSETRON HCL 4 MG PO TABS: 4.0000 mg | ORAL_TABLET | Freq: Four times a day (QID) | ORAL | Status: DC | PRN

## 2017-12-01 MED ORDER — ACETAMINOPHEN 650 MG RE SUPP: 650.0000 mg | Freq: Four times a day (QID) | RECTAL | Status: DC | PRN

## 2017-12-01 MED ORDER — SOTALOL HCL 80 MG PO TABS
80.0000 mg | ORAL_TABLET | Freq: Two times a day (BID) | ORAL | Status: DC
  Administered 2017-12-01 – 2017-12-02 (×3): 80 mg via ORAL
  Filled 2017-12-01 (×4): qty 1

## 2017-12-01 MED ORDER — DILTIAZEM LOAD VIA INFUSION
20.0000 mg | Freq: Once | INTRAVENOUS | Status: DC
  Filled 2017-12-01: qty 20

## 2017-12-01 MED ORDER — DOCUSATE SODIUM 100 MG PO CAPS
100.0000 mg | ORAL_CAPSULE | Freq: Two times a day (BID) | ORAL | Status: DC
  Administered 2017-12-01 (×2): 100 mg via ORAL
  Filled 2017-12-01 (×2): qty 1

## 2017-12-01 MED ORDER — ENOXAPARIN SODIUM 40 MG/0.4ML ~~LOC~~ SOLN: 40.0000 mg | SUBCUTANEOUS | Status: DC

## 2017-12-01 MED ORDER — ASPIRIN EC 81 MG PO TBEC
81.0000 mg | DELAYED_RELEASE_TABLET | Freq: Every day | ORAL | Status: DC
  Administered 2017-12-01: 81 mg via ORAL
  Filled 2017-12-01: qty 1

## 2017-12-01 MED ORDER — ONDANSETRON HCL 4 MG/2ML IJ SOLN: 4.0000 mg | Freq: Four times a day (QID) | INTRAMUSCULAR | Status: DC | PRN

## 2017-12-01 MED ORDER — SODIUM CHLORIDE 0.9 % IV BOLUS (SEPSIS)
1000.0000 mL | Freq: Once | INTRAVENOUS | Status: AC
Start: 2017-12-01 — End: 2017-12-01
  Administered 2017-12-01: 1000 mL via INTRAVENOUS

## 2017-12-01 MED ORDER — MAGNESIUM SULFATE 4 GM/100ML IV SOLN
4.0000 g | Freq: Once | INTRAVENOUS | Status: AC
  Administered 2017-12-01: 4 g via INTRAVENOUS
  Filled 2017-12-01: qty 100

## 2017-12-01 MED ORDER — LEVOTHYROXINE SODIUM 50 MCG PO TABS
50.0000 ug | ORAL_TABLET | Freq: Every day | ORAL | Status: DC
  Administered 2017-12-02: 50 ug via ORAL
  Filled 2017-12-01: qty 1

## 2017-12-01 MED ORDER — ENOXAPARIN SODIUM 120 MG/0.8ML ~~LOC~~ SOLN
1.0000 mg/kg | Freq: Two times a day (BID) | SUBCUTANEOUS | Status: DC
  Administered 2017-12-01 – 2017-12-02 (×2): 115 mg via SUBCUTANEOUS
  Filled 2017-12-01 (×3): qty 0.8

## 2017-12-01 MED ORDER — DILTIAZEM HCL 100 MG IV SOLR
5.0000 mg/h | INTRAVENOUS | Status: DC
  Administered 2017-12-01: 20 mg/h via INTRAVENOUS
  Administered 2017-12-01: 5 mg/h via INTRAVENOUS
  Filled 2017-12-01 (×2): qty 100

## 2017-12-01 MED ORDER — DILTIAZEM HCL 25 MG/5ML IV SOLN
10.0000 mg | INTRAVENOUS | Status: AC
  Administered 2017-12-01: 25 mg via INTRAVENOUS

## 2017-12-01 MED ORDER — DILTIAZEM HCL 25 MG/5ML IV SOLN: 25.0000 mg | Freq: Once | INTRAVENOUS | Status: DC

## 2017-12-01 MED ORDER — DIGOXIN 0.25 MG/ML IJ SOLN
0.2500 mg | Freq: Once | INTRAMUSCULAR | Status: AC
  Administered 2017-12-01: 0.25 mg via INTRAVENOUS
  Filled 2017-12-01: qty 1

## 2017-12-01 MED ORDER — ADENOSINE 12 MG/4ML IV SOLN
INTRAVENOUS | Status: AC
  Filled 2017-12-01: qty 4

## 2017-12-01 MED ORDER — DILTIAZEM HCL 100 MG IV SOLR
INTRAVENOUS | Status: AC
  Filled 2017-12-01: qty 100

## 2017-12-01 MED ORDER — DILTIAZEM HCL 25 MG/5ML IV SOLN
INTRAVENOUS | Status: AC
  Administered 2017-12-01: 25 mg via INTRAVENOUS
  Filled 2017-12-01: qty 5

## 2017-12-01 NOTE — Progress Notes (Signed)
[place] for electrolyte management   Pharmacy consulted for electrolyte management for 46 yo female admitted with atrial fibrillation.   Plan:   No replacement warranted at this time. Will recheck electolytes with am labs.   No Known Allergies  Patient Measurements: Height: 5\' 6"  (167.6 cm) Weight: 250 lb 0 oz (113.4 kg) IBW/kg (Calculated) : 59.3  Vital Signs: Temp: 98.2 F (36.8 C) (12/17 0900) Temp Source: Oral (12/17 0900) BP: 113/76 (12/17 1800) Pulse Rate: 74 (12/17 1800) Intake/Output from previous day: 12/16 0701 - 12/17 0700 In: 52.5 [I.V.:52.5] Out: -  Intake/Output from this shift: No intake/output data recorded.  Labs: Recent Labs    12/01/17 0126  WBC 10.1  HGB 13.9  HCT 40.1  PLT 262  CREATININE 0.88  ALBUMIN 3.9  PROT 8.0  AST 25  ALT 16  ALKPHOS 78  BILITOT 0.6   Estimated Creatinine Clearance: 103.1 mL/min (by C-G formula based on SCr of 0.88 mg/dL).    Pharmacy will continue to monitor and adjust per consult.   Simpson,Michael L 12/01/2017,7:09 PM

## 2017-12-01 NOTE — ED Notes (Signed)
Per MD Marcille Blanco, pt needs step down due to Cardizem drip needing to be elevated.

## 2017-12-01 NOTE — ED Triage Notes (Signed)
Pt states that she woke up around an hour ago with her pulse racing. Pt has tried vagal maneuver without success at this time.

## 2017-12-01 NOTE — Progress Notes (Signed)
The patient has no complaints.  Off Cardizem drip, on sotalol. Continue current treatment.  Follow-up echo and Dr. Laurelyn Sickle recommendation.  Time spent about 27 minutes.

## 2017-12-01 NOTE — Progress Notes (Signed)
   CHIEF COMPLAINT:   Chief Complaint  Patient presents with  . Tachycardia  . Chest Pain    Subjective  Patient woke up last night with palpitations Some chest pain Admitted to step down for assessment for afib Patient placed on Cardizem infusion Patient feels better since admission      Objective   Examination:  General exam: Appears calm and comfortable  Respiratory system: Clear to auscultation. Respiratory effort normal. HEENT: [place]/AT, PERRLA, no thrush, no stridor. Cardiovascular system: S1 & S2 heard, RRR. No JVD, murmurs, rubs, gallops or clicks. No pedal edema. Gastrointestinal system: Abdomen is nondistended, soft and nontender. No organomegaly or masses felt. Normal bowel sounds heard. Central nervous system: Alert and oriented. No focal neurological deficits. Extremities: Symmetric 5 x 5 power. Skin: No rashes, lesions or ulcers Psychiatry: Judgement and insight appear normal. Mood & affect appropriate.   VITALS:  height is 5\' 6"  (1.676 m) and weight is 250 lb 0 oz (113.4 kg). Her oral temperature is 98.1 F (36.7 C). Her blood pressure is 125/73 and her pulse is 103 (abnormal). Her respiration is 21 (abnormal) and oxygen saturation is 97%.   I personally reviewed Labs under Results section.  Radiology Reports Dg Chest Port 1 View  Result Date: 12/01/2017 CLINICAL DATA:  Tachycardia EXAM: PORTABLE CHEST 1 VIEW COMPARISON:  February 19, 2017 FINDINGS: Lungs are clear. Heart is upper normal in size with pulmonary vascularity within normal limits. No adenopathy. No pneumothorax. No bone lesions. IMPRESSION: No edema or consolidation. Electronically Signed   By: Lowella Grip III M.D.   On: 12/01/2017 02:41   I have Independently reviewed images of  CXR  on 12/01/2017 Interpretation: no effusions, no acute opacifications     Assessment/Plan:  46 yo AAF with acute on set of Afib with RVR 1.wean off cardizem 2.follow up cardiology recs 3.contiue  anticoagulation     LOS: 0 days   Lori Oliver

## 2017-12-01 NOTE — ED Provider Notes (Signed)
[facility] Emergency Department Provider Note  ____________________________________________   First MD Initiated Contact with Patient 12/01/17 0124     (approximate)  I have reviewed the triage vital signs and the nursing notes.   HISTORY  Chief Complaint Tachycardia and Chest Pain   HPI Lori Oliver is a 46 y.o. female who self presents to the emergency department with sudden onset severe palpitations that awoke her from sleep.  She has no past medical history and takes no medications aside from a birth control implant.  She has never had chest pain or palpitations like this before.  She denies drug or alcohol use.  She denies a history of thyroid disorder.  Her symptoms began suddenly are severe and constant.  Nothing seems to make them better or worse.  Past Medical History:  Diagnosis Date  . Abnormal thyroid function test 05/16/2016  . 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 Decreased libido 05/16/2016  . Hypertension   . IFG (impaired fasting glucose) 05/16/2016  . Morbid obesity ([place]) 06/01/2015    Patient Active Problem List   Diagnosis Date Noted  . Atrial fibrillation with RVR ([place]) 12/01/2017  . Vitamin D deficiency 07/08/2017  . Lower back pain 07/07/2017  . Screen for colon cancer 07/07/2017  . Morbid obesity ([place]) 07/07/2017  . Bilateral pneumonia 01/29/2017  . Hypertension 07/11/2016  . Preventative health care 05/16/2016  . IFG (impaired fasting glucose) 05/16/2016  . 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 Abnormal thyroid function test 05/16/2016  . Decreased libido 05/16/2016  . Breast cancer screening 05/16/2016  . Breast lump on left side at 5 o'clock position 05/16/2016  . Contraception management 05/16/2016  . Surveillance for birth control, oral contraceptives 02/12/2016  . Bilateral thoracic back pain 06/01/2015  . Blood pressure elevated without history of HTN 06/01/2015  . History of breast lump 06/01/2015  .  History of abnormal cervical Pap smear 06/01/2015    Past Surgical History:  Procedure Laterality Date  . BREAST BIOPSY Right 2015   benign  . HAMMER TOE SURGERY      Prior to Admission medications   Medication Sig Start Date End Date Taking? Authorizing Provider  ibuprofen (ADVIL,MOTRIN) 200 MG tablet Take 400 mg by mouth every 6 (six) hours as needed for mild pain.    Yes [provider]  Vitamin D, Ergocalciferol, (DRISDOL) 50000 units CAPS capsule Take 1 capsule (50,000 Units total) by mouth every 30 (thirty) days. 10/16/17 04/22/18 Yes Lada, Satira Anis, MD  Naltrexone-Bupropion HCl ER 8-90 MG TB12 One by mouth daily x 1 week, then 1 pill BID x 1 week, then 2 pills in AM and 1 pill in PM x 1 week, then 2 pills BID 07/07/17   Lada, Satira Anis, MD    Allergies Patient has no known allergies.  Family History  Problem Relation Age of Onset  . Hypertension Mother   . Heart disease Mother   . Arthritis Mother   . Depression Mother   . Stroke Maternal Grandmother     Social History Social History   Tobacco Use  . Smoking status: Never Smoker  . Smokeless tobacco: Never Used  Substance Use Topics  . Alcohol use: No    Alcohol/week: 0.0 oz  . Drug use: No    Review of Systems Constitutional: No fever/chills Eyes: No visual changes. ENT: No sore throat. Cardiovascular: Positive for chest pain. Respiratory: Positive for shortness of breath. Gastrointestinal: No abdominal pain.  No nausea,  no vomiting.  No diarrhea.  No constipation. Genitourinary: Negative for dysuria. Musculoskeletal: Negative for back pain. Skin: Negative for rash. Neurological: Negative for headaches, focal weakness or numbness.   ____________________________________________   PHYSICAL EXAM:  VITAL SIGNS: ED Triage Vitals  Enc Vitals Group     BP      Pulse      Resp      Temp      Temp src      SpO2      Weight      Height      Head Circumference      Peak Flow      Pain Score        Pain Loc      Pain Edu?      Excl. in [place]?     Constitutional: Alert and oriented x4 anxious appearing nontoxic no diaphoresis speaks full clear sentences Eyes: PERRL EOMI. Head: Atraumatic. Nose: No congestion/rhinnorhea. Mouth/Throat: No trismus no palpable thyroid Neck: No stridor.   Cardiovascular: Irregularly irregular and tachycardic no murmurs appreciated Respiratory: Increased respiratory effort.  No retractions. Lungs CTAB and moving good air Gastrointestinal: Soft nontender Musculoskeletal: No lower extremity edema   Neurologic:  Normal speech and language. No gross focal neurologic deficits are appreciated. Skin:  Skin is warm, dry and intact. No rash noted. Psychiatric: Anxious appearing    ____________________________________________   DIFFERENTIAL includes but not limited to  SVT, A. fib RVR, a flutter RVR, ventricular tachycardia, acute coronary syndrome, thyrotoxicosis ____________________________________________   LABS (all labs ordered are listed, but only abnormal results are displayed)  Labs Reviewed  CBC WITH DIFFERENTIAL/PLATELET - Abnormal; Notable for the following components:      Result Value   Lymphs Abs 5.1 (*)    All other components within normal limits  COMPREHENSIVE METABOLIC PANEL - Abnormal; Notable for the following components:   Glucose, Bld 118 (*)    All other components within normal limits  URINALYSIS, COMPLETE (UACMP) WITH MICROSCOPIC - Abnormal; Notable for the following components:   Color, Urine STRAW (*)    APPearance HAZY (*)    Specific Gravity, Urine 1.002 (*)    Hgb urine dipstick SMALL (*)    Leukocytes, UA TRACE (*)    Bacteria, UA MANY (*)    Squamous Epithelial / LPF 0-5 (*)    All other components within normal limits  TSH - Abnormal; Notable for the following components:   TSH 4.663 (*)    All other components within normal limits  TROPONIN I  ETHANOL  BRAIN NATRIURETIC PEPTIDE  URINE DRUG SCREEN,  QUALITATIVE ([facility] ONLY)  HCG, QUANTITATIVE, PREGNANCY    Lab work reviewed by me with no obvious etiology of her tachycardia __________________________________________  EKG  ED ECG REPORT I, Darel Hong, the attending physician, personally viewed and interpreted this ECG.  Date: 12/01/2017 EKG Time:  Rate: 203 Rhythm: Atrial fibrillation with rapid ventricular response QRS Axis: normal Intervals: normal ST/T Wave abnormalities: normal Narrative Interpretation: no evidence of acute ischemia  ____________________________________________  RADIOLOGY  Chest x-ray reviewed by me with no acute disease ____________________________________________   PROCEDURES  Procedure(s) performed: no  .Critical Care Performed by: Darel Hong, MD Authorized by: Darel Hong, MD   Critical care provider statement:    Critical care time (minutes):  40   Critical care was necessary to treat or prevent imminent or life-threatening deterioration of the following conditions:  Cardiac failure and circulatory failure   Critical care was time spent  personally by me on the following activities:  Blood draw for specimens, development of treatment plan with patient or surrogate, discussions with consultants, evaluation of patient's response to treatment, examination of patient, re-evaluation of patient's condition, pulse oximetry, ordering and review of radiographic studies, ordering and review of laboratory studies and ordering and performing treatments and interventions    Yes   Observation: no ____________________________________________   INITIAL IMPRESSION / ASSESSMENT AND PLAN / ED COURSE  Pertinent labs & imaging results that were available during my care of the patient were reviewed by me and considered in my medical decision making (see chart for details).  On arrival the patient is irregularly irregular and quite tachycardic with a narrow complex.  Her EKG is most consistent  with atrial fibrillation with rapid ventricular response.  She does report taking a significant amount of cold medication recently this may be the etiology of her symptoms.  Blood work is pending including TSH and drug screen.  I will start her on a diltiazem drip with the push now.  ----------------------------------------- 2:53 AM on 12/01/2017 -----------------------------------------  The patient has responded nicely to diltiazem and magnesium.  Unclear etiology of her symptoms but she remains in atrial fibrillation and at this point she requires inpatient admission for further workup and treatment.  The patient verbalized understanding and agree with the plan.  I discussed with the hospitalist Dr. Marcille Blanco who is graciously agreed to admit the patient to his service.      ____________________________________________   FINAL CLINICAL IMPRESSION(S) / ED DIAGNOSES  Final diagnoses:  Atrial fibrillation with rapid ventricular response ([place])      NEW MEDICATIONS STARTED DURING THIS VISIT:  This SmartLink is deprecated. Use AVSMEDLIST instead to display the medication list for a patient.   Note:  This document was prepared using Dragon voice recognition software and may include unintentional dictation errors.     Darel Hong, MD 12/01/17 [phone]

## 2017-12-01 NOTE — H&P (Signed)
Lori Oliver is an 46 y.o. female.   Chief Complaint: Chest pain HPI: The patient with past medical history of metabolic syndrome, hypertension and obesity presents to the emergency department with chest tightness.  The patient states that she began to feel palpitations and a tight feeling in her throat and upper chest as she tried to fall asleep.  She admits to intermittent shortness of breath for the last 5 days.  In the emergency department she was found to have atrial fibrillation with rapid ventricular rate.  She was started on a diltiazem drip and then received a digoxin load but did not convert to normal sinus rhythm.  Her rate remained greater than 110 which prompted the emergency department staff to call the hospitalist service for admission.  Past Medical History:  Diagnosis Date  . Abnormal thyroid function test 05/16/2016  . 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 Decreased libido 05/16/2016  . Hypertension   . IFG (impaired fasting glucose) 05/16/2016  . Morbid obesity ([place]) 06/01/2015    Past Surgical History:  Procedure Laterality Date  . BREAST BIOPSY Right 2015   benign  . HAMMER TOE SURGERY      Family History  Problem Relation Age of Onset  . Hypertension Mother   . Heart disease Mother   . Arthritis Mother   . Depression Mother   . Stroke Maternal Grandmother    Social History:  reports that  has never smoked. she has never used smokeless tobacco. She reports that she does not drink alcohol or use drugs.  Allergies: No Known Allergies  Medications Prior to Admission  Medication Sig Dispense Refill  . Doxylamine-DM 6.25-15 MG/15ML LIQD Take 1 Dose by mouth as needed (cough).     Marland Kitchen guaiFENesin (MUCINEX) 600 MG 12 hr tablet Take 600 mg by mouth 2 (two) times daily.    Marland Kitchen ibuprofen (ADVIL,MOTRIN) 200 MG tablet Take 400 mg by mouth every 6 (six) hours as needed for mild pain.       Results for orders placed or performed during the hospital encounter of 12/01/17  (from the past 48 hour(s))  Troponin I     Status: None   Collection Time: 12/01/17  1:26 AM  Result Value Ref Range   Troponin I <0.03 <0.03 ng/mL  CBC with Differential     Status: Abnormal   Collection Time: 12/01/17  1:26 AM  Result Value Ref Range   WBC 10.1 3.6 - 11.0 K/uL   RBC 4.24 3.80 - 5.20 MIL/uL   Hemoglobin 13.9 12.0 - 16.0 g/dL   HCT 40.1 35.0 - 47.0 %   MCV 94.7 80.0 - 100.0 fL   MCH 32.9 26.0 - 34.0 pg   MCHC 34.7 32.0 - 36.0 g/dL   RDW 12.8 11.5 - 14.5 %   Platelets 262 150 - 440 K/uL   Neutrophils Relative % 37 %   Neutro Abs 3.7 1.4 - 6.5 K/uL   Lymphocytes Relative 51 %   Lymphs Abs 5.1 (H) 1.0 - 3.6 K/uL   Monocytes Relative 8 %   Monocytes Absolute 0.8 0.2 - 0.9 K/uL   Eosinophils Relative 3 %   Eosinophils Absolute 0.3 0 - 0.7 K/uL   Basophils Relative 1 %   Basophils Absolute 0.1 0 - 0.1 K/uL  Comprehensive metabolic panel     Status: Abnormal   Collection Time: 12/01/17  1:26 AM  Result Value Ref Range   Sodium 137 135 - 145 mmol/L   Potassium 3.5  3.5 - 5.1 mmol/L   Chloride 104 101 - 111 mmol/L   CO2 22 22 - 32 mmol/L   Glucose, Bld 118 (H) 65 - 99 mg/dL   BUN 12 6 - 20 mg/dL   Creatinine, Ser 0.88 0.44 - 1.00 mg/dL   Calcium 8.9 8.9 - 10.3 mg/dL   Total Protein 8.0 6.5 - 8.1 g/dL   Albumin 3.9 3.5 - 5.0 g/dL   AST 25 15 - 41 U/L   ALT 16 14 - 54 U/L   Alkaline Phosphatase 78 38 - 126 U/L   Total Bilirubin 0.6 0.3 - 1.2 mg/dL   GFR calc non Af Amer >60 >60 mL/min   GFR calc Af Amer >60 >60 mL/min    Comment: (NOTE) The eGFR has been calculated using the CKD EPI equation. This calculation has not been validated in all clinical situations. eGFR's persistently <60 mL/min signify possible Chronic Kidney Disease.    Anion gap 11 5 - 15  Ethanol     Status: None   Collection Time: 12/01/17  1:26 AM  Result Value Ref Range   Alcohol, Ethyl (B) <10 <10 mg/dL    Comment:        LOWEST DETECTABLE LIMIT FOR SERUM ALCOHOL IS 10 mg/dL FOR  MEDICAL PURPOSES ONLY   Brain natriuretic peptide     Status: None   Collection Time: 12/01/17  1:26 AM  Result Value Ref Range   B Natriuretic Peptide 26.0 0.0 - 100.0 pg/mL  Urine Drug Screen, Qualitative     Status: None   Collection Time: 12/01/17  1:26 AM  Result Value Ref Range   Tricyclic, Ur Screen NONE DETECTED NONE DETECTED   Amphetamines, Ur Screen NONE DETECTED NONE DETECTED   MDMA (Ecstasy)Ur Screen NONE DETECTED NONE DETECTED   Cocaine Metabolite,Ur [place] NONE DETECTED NONE DETECTED   Opiate, Ur Screen NONE DETECTED NONE DETECTED   Phencyclidine (PCP) Ur S NONE DETECTED NONE DETECTED   Cannabinoid 50 Ng, Ur [place] NONE DETECTED NONE DETECTED   Barbiturates, Ur Screen NONE DETECTED NONE DETECTED   Benzodiazepine, Ur Scrn NONE DETECTED NONE DETECTED   Methadone Scn, Ur NONE DETECTED NONE DETECTED    Comment: (NOTE) 277  Tricyclics, urine               Cutoff 1000 ng/mL 200  Amphetamines, urine             Cutoff 1000 ng/mL 300  MDMA (Ecstasy), urine           Cutoff 500 ng/mL 400  Cocaine Metabolite, urine       Cutoff 300 ng/mL 500  Opiate, urine                   Cutoff 300 ng/mL 600  Phencyclidine (PCP), urine      Cutoff 25 ng/mL 700  Cannabinoid, urine              Cutoff 50 ng/mL 800  Barbiturates, urine             Cutoff 200 ng/mL 900  Benzodiazepine, urine           Cutoff 200 ng/mL 1000 Methadone, urine                Cutoff 300 ng/mL 1100 1200 The urine drug screen provides only a preliminary, unconfirmed 1300 analytical test result and should not be used for non-medical 1400 purposes. Clinical consideration and professional judgment should 1500 be applied to any  positive drug screen result due to possible 1600 interfering substances. A more specific alternate chemical method 1700 must be used in order to obtain a confirmed analytical result.  1800 Gas chromato graphy / mass spectrometry (GC/MS) is the preferred 1900 confirmatory method.   Urinalysis, Complete  w Microscopic     Status: Abnormal   Collection Time: 12/01/17  1:26 AM  Result Value Ref Range   Color, Urine STRAW (A) YELLOW   APPearance HAZY (A) CLEAR   Specific Gravity, Urine 1.002 (L) 1.005 - 1.030   pH 7.0 5.0 - 8.0   Glucose, UA NEGATIVE NEGATIVE mg/dL   Hgb urine dipstick SMALL (A) NEGATIVE   Bilirubin Urine NEGATIVE NEGATIVE   Ketones, ur NEGATIVE NEGATIVE mg/dL   Protein, ur NEGATIVE NEGATIVE mg/dL   Nitrite NEGATIVE NEGATIVE   Leukocytes, UA TRACE (A) NEGATIVE   RBC / HPF 0-5 0 - 5 RBC/hpf   WBC, UA 0-5 0 - 5 WBC/hpf   Bacteria, UA MANY (A) NONE SEEN   Squamous Epithelial / LPF 0-5 (A) NONE SEEN  TSH     Status: Abnormal   Collection Time: 12/01/17  1:26 AM  Result Value Ref Range   TSH 4.663 (H) 0.350 - 4.500 uIU/mL    Comment: Performed by a 3rd Generation assay with a functional sensitivity of <=0.01 uIU/mL.  hCG, quantitative, pregnancy     Status: None   Collection Time: 12/01/17  1:26 AM  Result Value Ref Range   hCG, Beta Chain, Quant, S 1 <5 mIU/mL    Comment:          GEST. AGE      CONC.  (mIU/mL)   <=1 WEEK        5 - 50     2 WEEKS       50 - 500     3 WEEKS       100 - 10,000     4 WEEKS     1,000 - 30,000     5 WEEKS     3,500 - 115,000   6-8 WEEKS     12,000 - 270,000    12 WEEKS     15,000 - 220,000        FEMALE AND NON-PREGNANT FEMALE:     LESS THAN 5 mIU/mL   Glucose, capillary     Status: Abnormal   Collection Time: 12/01/17  6:15 AM  Result Value Ref Range   Glucose-Capillary 126 (H) 65 - 99 mg/dL  MRSA PCR Screening     Status: None   Collection Time: 12/01/17  6:19 AM  Result Value Ref Range   MRSA by PCR NEGATIVE NEGATIVE    Comment:        The GeneXpert MRSA Assay (FDA approved for NASAL specimens only), is one component of a comprehensive MRSA colonization surveillance program. It is not intended to diagnose MRSA infection nor to guide or monitor treatment for MRSA infections.    Dg Chest Port 1 View  Result Date:  12/01/2017 CLINICAL DATA:  Tachycardia EXAM: PORTABLE CHEST 1 VIEW COMPARISON:  February 19, 2017 FINDINGS: Lungs are clear. Heart is upper normal in size with pulmonary vascularity within normal limits. No adenopathy. No pneumothorax. No bone lesions. IMPRESSION: No edema or consolidation. Electronically Signed   By: Lowella Grip III M.D.   On: 12/01/2017 02:41    Review of Systems  Constitutional: Negative for chills and fever.  HENT: Negative for sore throat and tinnitus.  Eyes: Negative for blurred vision and redness.  Respiratory: Positive for shortness of breath. Negative for cough.   Cardiovascular: Positive for palpitations. Negative for chest pain, orthopnea and PND.  Gastrointestinal: Negative for abdominal pain, diarrhea, nausea and vomiting.  Genitourinary: Negative for dysuria, frequency and urgency.  Musculoskeletal: Negative for joint pain and myalgias.  Skin: Negative for rash.       No lesions  Neurological: Negative for speech change, focal weakness and weakness.  Endo/Heme/Allergies: Does not bruise/bleed easily.       No temperature intolerance  Psychiatric/Behavioral: Negative for depression and suicidal ideas.    Blood pressure 121/89, pulse 98, temperature 98.1 F (36.7 C), temperature source Oral, resp. rate (!) 24, height '5\' 6"'  (1.676 m), weight 113.4 kg (250 lb 0 oz), SpO2 97 %. Physical Exam  Vitals reviewed. Constitutional: She is oriented to person, place, and time. She appears well-developed and well-nourished.  HENT:  Head: Normocephalic and atraumatic.  Mouth/Throat: Oropharynx is clear and moist.  Eyes: Conjunctivae and EOM are normal. Pupils are equal, round, and reactive to light. No scleral icterus.  Neck: Normal range of motion. Neck supple. No JVD present. No tracheal deviation present. No thyromegaly present.  Cardiovascular: Normal heart sounds. An irregularly irregular rhythm present. Tachycardia present. Exam reveals no gallop and no friction  rub.  No murmur heard. Respiratory: Effort normal and breath sounds normal.  GI: Soft. Bowel sounds are normal. She exhibits no distension. There is no tenderness.  Genitourinary:  Genitourinary Comments: Deferred  Musculoskeletal: Normal range of motion. She exhibits no edema.  Lymphadenopathy:    She has no cervical adenopathy.  Neurological: She is alert and oriented to person, place, and time. No cranial nerve deficit. She exhibits normal muscle tone.  Skin: Skin is warm and dry. No rash noted. No erythema.  Psychiatric: She has a normal mood and affect. Her behavior is normal. Judgment and thought content normal.     Assessment/Plan This is a 46 year old female admitted for atrial fibrillation with rapid ventricular rate. 1.  A. fib: New onset; rapid ventricular rate.  The patient has been on a diltiazem drip and received a total of diltiazem 20 mg IV bolus.  Her rate is still greater than 120 bpm.  She is hemodynamically stable.  Consult cardiology for further antiarrhythmic recommendations.  The patient appears to have been symptomatic for at least 4 days albeit intermittently.  I have anticoagulated her in case cardioversion is necessary. 2.  Hypertension: initially uncontrolled; the patient has not been on medications as an outpatient. Continue to monitor. Start beta blocker if needed.  3.  Hypothyroidism: Elevated TSH; initiate Synthroid.  May contribute to new onset A. fib. 4.  Morbid obesity: BMI 40.4; encouraged healthy diet and exercise 5.  DVT prophylaxis: Therapeutic anticoagulation 6.  GI prophylaxis: None The patient is a full code.  Time spent on admission orders and critical care approximately 45 minutes. (The patient was moved to stepdown unit due to telemetry unity guidelines).   Harrie Foreman, MD 12/01/2017, 8:14 AM

## 2017-12-01 NOTE — Progress Notes (Signed)
Patient admitted to ICU from ED with HR in low 110s. Does get up to 150s but does not sustain with movement. ED RN said that occurred in the ED. NP aware and said the patient can have an Amiodarone bolus if sustaining in the 150s. Will pass on to dayshift RN. Patient complained of some chest tenderness, and can feel the palpitations but did not want any medication at this time.

## 2017-12-01 NOTE — Progress Notes (Signed)
[facility] Progress Note Patient Name: Lori Oliver DOB: [birth year] MRN: [id number]   Date of Service  12/01/2017  HPI/Events of Note  46 yo female admitted with AFIB with RVR, No H&P in Epic. Orders for Digoxin, Cardizem IV infusion and Full dose Lovenox noted. PCCM asked to assume care in ICU. VSS.   eICU Interventions  No new orders.      Intervention Category Evaluation Type: New Patient Evaluation  Lysle Dingwall 12/01/2017, 6:37 AM

## 2017-12-01 NOTE — ED Notes (Signed)
Called to CCU in order to give reports and the accepting RN will call back for report.

## 2017-12-01 NOTE — Consult Note (Signed)
Lori Oliver is a 46 y.o. female  [id number]  Primary Cardiologist: Neoma Laming Reason for Consultation:Chest pain and atrial fibrillation.  HPI: 46 YOBF presented with chest pain and palpitation and found to be in afib with RVR.   Review of Systems: Had chest pain and shortness of breath   Past Medical History:  Diagnosis Date  . Abnormal thyroid function test 05/16/2016  . 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 Decreased libido 05/16/2016  . Hypertension   . IFG (impaired fasting glucose) 05/16/2016  . Morbid obesity ([place]) 06/01/2015    Medications Prior to Admission  Medication Sig Dispense Refill  . Doxylamine-DM 6.25-15 MG/15ML LIQD Take 1 Dose by mouth as needed (cough).     Marland Kitchen guaiFENesin (MUCINEX) 600 MG 12 hr tablet Take 600 mg by mouth 2 (two) times daily.    Marland Kitchen ibuprofen (ADVIL,MOTRIN) 200 MG tablet Take 400 mg by mouth every 6 (six) hours as needed for mild pain.        Marland Kitchen aspirin EC  81 mg Oral Daily  . diltiazem  20 mg Intravenous Once  . docusate sodium  100 mg Oral BID  . enoxaparin (LOVENOX) injection  1 mg/kg Subcutaneous Q12H  . levothyroxine  50 mcg Oral QAC breakfast  . sotalol  80 mg Oral Q12H    Infusions: . diltiazem (CARDIZEM) infusion 15 mg/hr (12/01/17 0905)    No Known Allergies  Social History   Socioeconomic History  . Marital status: Married    Spouse name: Not on file  . Number of children: Not on file  . Years of education: Not on file  . Highest education level: Not on file  Social Needs  . Financial resource strain: Not on file  . Food insecurity - worry: Not on file  . Food insecurity - inability: Not on file  . Transportation needs - medical: Not on file  . Transportation needs - non-medical: Not on file  Occupational History  . Not on file  Tobacco Use  . Smoking status: Never Smoker  . Smokeless tobacco: Never Used  Substance and Sexual Activity  . Alcohol use: No    Alcohol/week: 0.0 oz  . Drug use: No  .  Sexual activity: Yes    Partners: Male    Birth control/protection: Pill  Other Topics Concern  . Not on file  Social History Narrative  . Not on file    Family History  Problem Relation Age of Onset  . Hypertension Mother   . Heart disease Mother   . Arthritis Mother   . Depression Mother   . Stroke Maternal Grandmother     PHYSICAL EXAM: Vitals:   12/01/17 0600 12/01/17 0700  BP: 132/72 121/89  Pulse:  98  Resp:  (!) 24  Temp: 98.1 F (36.7 C)   SpO2: 96% 97%     Intake/Output Summary (Last 24 hours) at 12/01/2017 0920 Last data filed at 12/01/2017 0800 Gross per 24 hour  Intake 86.67 ml  Output -  Net 86.67 ml    General:  Well appearing. No respiratory difficulty HEENT: normal Neck: supple. no JVD. Carotids 2+ bilat; no bruits. No lymphadenopathy or thryomegaly appreciated. Cor: PMI nondisplaced. Regular rate & rhythm. No rubs, gallops or murmurs. Lungs: clear Abdomen: soft, nontender, nondistended. No hepatosplenomegaly. No bruits or masses. Good bowel sounds. Extremities: no cyanosis, clubbing, rash, edema Neuro: alert & oriented x 3, cranial nerves grossly intact. moves all 4 extremities w/o difficulty. Affect pleasant.  ECG: afib with RVR  Results for orders placed or performed during the hospital encounter of 12/01/17 (from the past 24 hour(s))  Troponin I     Status: None   Collection Time: 12/01/17  1:26 AM  Result Value Ref Range   Troponin I <0.03 <0.03 ng/mL  CBC with Differential     Status: Abnormal   Collection Time: 12/01/17  1:26 AM  Result Value Ref Range   WBC 10.1 3.6 - 11.0 K/uL   RBC 4.24 3.80 - 5.20 MIL/uL   Hemoglobin 13.9 12.0 - 16.0 g/dL   HCT 40.1 35.0 - 47.0 %   MCV 94.7 80.0 - 100.0 fL   MCH 32.9 26.0 - 34.0 pg   MCHC 34.7 32.0 - 36.0 g/dL   RDW 12.8 11.5 - 14.5 %   Platelets 262 150 - 440 K/uL   Neutrophils Relative % 37 %   Neutro Abs 3.7 1.4 - 6.5 K/uL   Lymphocytes Relative 51 %   Lymphs Abs 5.1 (H) 1.0 - 3.6 K/uL    Monocytes Relative 8 %   Monocytes Absolute 0.8 0.2 - 0.9 K/uL   Eosinophils Relative 3 %   Eosinophils Absolute 0.3 0 - 0.7 K/uL   Basophils Relative 1 %   Basophils Absolute 0.1 0 - 0.1 K/uL  Comprehensive metabolic panel     Status: Abnormal   Collection Time: 12/01/17  1:26 AM  Result Value Ref Range   Sodium 137 135 - 145 mmol/L   Potassium 3.5 3.5 - 5.1 mmol/L   Chloride 104 101 - 111 mmol/L   CO2 22 22 - 32 mmol/L   Glucose, Bld 118 (H) 65 - 99 mg/dL   BUN 12 6 - 20 mg/dL   Creatinine, Ser 0.88 0.44 - 1.00 mg/dL   Calcium 8.9 8.9 - 10.3 mg/dL   Total Protein 8.0 6.5 - 8.1 g/dL   Albumin 3.9 3.5 - 5.0 g/dL   AST 25 15 - 41 U/L   ALT 16 14 - 54 U/L   Alkaline Phosphatase 78 38 - 126 U/L   Total Bilirubin 0.6 0.3 - 1.2 mg/dL   GFR calc non Af Amer >60 >60 mL/min   GFR calc Af Amer >60 >60 mL/min   Anion gap 11 5 - 15  Ethanol     Status: None   Collection Time: 12/01/17  1:26 AM  Result Value Ref Range   Alcohol, Ethyl (B) <10 <10 mg/dL  Brain natriuretic peptide     Status: None   Collection Time: 12/01/17  1:26 AM  Result Value Ref Range   B Natriuretic Peptide 26.0 0.0 - 100.0 pg/mL  Urine Drug Screen, Qualitative     Status: None   Collection Time: 12/01/17  1:26 AM  Result Value Ref Range   Tricyclic, Ur Screen NONE DETECTED NONE DETECTED   Amphetamines, Ur Screen NONE DETECTED NONE DETECTED   MDMA (Ecstasy)Ur Screen NONE DETECTED NONE DETECTED   Cocaine Metabolite,Ur [place] NONE DETECTED NONE DETECTED   Opiate, Ur Screen NONE DETECTED NONE DETECTED   Phencyclidine (PCP) Ur S NONE DETECTED NONE DETECTED   Cannabinoid 50 Ng, Ur [place] NONE DETECTED NONE DETECTED   Barbiturates, Ur Screen NONE DETECTED NONE DETECTED   Benzodiazepine, Ur Scrn NONE DETECTED NONE DETECTED   Methadone Scn, Ur NONE DETECTED NONE DETECTED  Urinalysis, Complete w Microscopic     Status: Abnormal   Collection Time: 12/01/17  1:26 AM  Result Value Ref Range   Color, Urine STRAW (  A) YELLOW    APPearance HAZY (A) CLEAR   Specific Gravity, Urine 1.002 (L) 1.005 - 1.030   pH 7.0 5.0 - 8.0   Glucose, UA NEGATIVE NEGATIVE mg/dL   Hgb urine dipstick SMALL (A) NEGATIVE   Bilirubin Urine NEGATIVE NEGATIVE   Ketones, ur NEGATIVE NEGATIVE mg/dL   Protein, ur NEGATIVE NEGATIVE mg/dL   Nitrite NEGATIVE NEGATIVE   Leukocytes, UA TRACE (A) NEGATIVE   RBC / HPF 0-5 0 - 5 RBC/hpf   WBC, UA 0-5 0 - 5 WBC/hpf   Bacteria, UA MANY (A) NONE SEEN   Squamous Epithelial / LPF 0-5 (A) NONE SEEN  TSH     Status: Abnormal   Collection Time: 12/01/17  1:26 AM  Result Value Ref Range   TSH 4.663 (H) 0.350 - 4.500 uIU/mL  hCG, quantitative, pregnancy     Status: None   Collection Time: 12/01/17  1:26 AM  Result Value Ref Range   hCG, Beta Chain, Quant, S 1 <5 mIU/mL  Glucose, capillary     Status: Abnormal   Collection Time: 12/01/17  6:15 AM  Result Value Ref Range   Glucose-Capillary 126 (H) 65 - 99 mg/dL  MRSA PCR Screening     Status: None   Collection Time: 12/01/17  6:19 AM  Result Value Ref Range   MRSA by PCR NEGATIVE NEGATIVE   Dg Chest Port 1 View  Result Date: 12/01/2017 CLINICAL DATA:  Tachycardia EXAM: PORTABLE CHEST 1 VIEW COMPARISON:  February 19, 2017 FINDINGS: Lungs are clear. Heart is upper normal in size with pulmonary vascularity within normal limits. No adenopathy. No pneumothorax. No bone lesions. IMPRESSION: No edema or consolidation. Electronically Signed   By: Lowella Grip III M.D.   On: 12/01/2017 02:41     ASSESSMENT AND PLAN: Atrial fibrillation with RVR. Advise echo and add sotolol as has elevated TSH and amiodrone is not right choice for that. Agree with anticoagulation.

## 2017-12-02 ENCOUNTER — Inpatient Hospital Stay
Admit: 2017-12-02 | Discharge: 2017-12-02 | Disposition: A | Discharge status: Home / Self Care | Payer: Managed Care, Other (non HMO) | Attending: Cardiovascular Disease | Admitting: Cardiovascular Disease

## 2017-12-02 LAB — BASIC METABOLIC PANEL
Anion gap: 7 (ref 5–15)
BUN: 15 mg/dL (ref 6–20)
CHLORIDE: 104 mmol/L (ref 101–111)
CO2: 24 mmol/L (ref 22–32)
CREATININE: 0.77 mg/dL (ref 0.44–1.00)
Calcium: 8.7 mg/dL — ABNORMAL LOW (ref 8.9–10.3)
GFR calc non Af Amer: >60 mL/min (ref >60)
Glucose, Bld: 101 mg/dL — ABNORMAL HIGH (ref 65–99)
Potassium: 4.1 mmol/L (ref 3.5–5.1)
Sodium: 135 mmol/L (ref 135–145)

## 2017-12-02 LAB — ECHOCARDIOGRAM COMPLETE
Height: 66 in
WEIGHTICAEL: 4000.03 [oz_av]

## 2017-12-02 LAB — MAGNESIUM: Magnesium: 2.2 mg/dL (ref 1.7–2.4)

## 2017-12-02 MED ORDER — SOTALOL HCL 80 MG PO TABS: 80.0000 mg | ORAL_TABLET | Freq: Two times a day (BID) | ORAL | 0 refills | Status: DC

## 2017-12-02 MED ORDER — APIXABAN 5 MG PO TABS: 5.0000 mg | ORAL_TABLET | Freq: Two times a day (BID) | ORAL | Status: DC

## 2017-12-02 MED ORDER — APIXABAN 5 MG PO TABS: 5.0000 mg | ORAL_TABLET | Freq: Two times a day (BID) | ORAL | 0 refills | Status: DC

## 2017-12-02 MED ORDER — LEVOTHYROXINE SODIUM 50 MCG PO TABS: 50.0000 ug | ORAL_TABLET | Freq: Every day | ORAL | 1 refills | Status: DC

## 2017-12-02 NOTE — Progress Notes (Signed)
SUBJECTIVE: Pt is feeling much better today. No shortness of breath or chest pain. No palpitations reported.   Vitals:   12/02/17 0300 12/02/17 0400 12/02/17 0500 12/02/17 0600  BP: (!) 76/64 127/84 136/90 119/81  Pulse: 68 66 70 65  Resp: 13 18 13 13   Temp:      TempSrc:      SpO2: 97% 99% 96% 97%  Weight:      Height:       No intake or output data in the 24 hours ending 12/02/17 0859  LABS: Basic Metabolic Panel: Recent Labs    12/01/17 0126 12/02/17 0621  NA 137 135  K 3.5 4.1  CL 104 104  CO2 22 24  GLUCOSE 118* 101*  BUN 12 15  CREATININE 0.88 0.77  CALCIUM 8.9 8.7*  MG  --  2.2   Liver Function Tests: Recent Labs    12/01/17 0126  AST 25  ALT 16  ALKPHOS 78  BILITOT 0.6  PROT 8.0  ALBUMIN 3.9   No results for input(s): LIPASE, AMYLASE in the last 72 hours. CBC: Recent Labs    12/01/17 0126  WBC 10.1  NEUTROABS 3.7  HGB 13.9  HCT 40.1  MCV 94.7  PLT 262   Cardiac Enzymes: Recent Labs    12/01/17 0126  TROPONINI <0.03   BNP: Invalid input(s): POCBNP D-Dimer: No results for input(s): DDIMER in the last 72 hours. Hemoglobin A1C: No results for input(s): HGBA1C in the last 72 hours. Fasting Lipid Panel: No results for input(s): CHOL, HDL, LDLCALC, TRIG, CHOLHDL, LDLDIRECT in the last 72 hours. Thyroid Function Tests: Recent Labs    12/01/17 0126  TSH 4.663*   Anemia Panel: No results for input(s): VITAMINB12, FOLATE, FERRITIN, TIBC, IRON, RETICCTPCT in the last 72 hours.   PHYSICAL EXAM General: Well developed, well nourished, in no acute distress HEENT:  Normocephalic and atramatic Neck:  No JVD.  Lungs: Clear bilaterally to auscultation and percussion. Heart: HRRR . Normal S1 and S2 without gallops or murmurs.  Abdomen: Bowel sounds are positive, abdomen soft and non-tender  Msk:  Back normal, normal gait. Normal strength and tone for age. Extremities: No clubbing, cyanosis or edema.   Neuro: Alert and oriented X 3. Psych:   Good affect, responds appropriately  TELEMETRY: NSR 74bpm  ASSESSMENT AND PLAN: History of atrial fibrillation with RVR. Converted to sinus rhythm with sotalol. Vital signs are stable, pt is feeling well. Stable from cardiac perspective for discharge. Advise discharge today on sotalol 80mg  BID and Eliquis. Follow up at [facility] on Thrusday 12/04/17.  Active Problems:   Atrial fibrillation with RVR ([place])    Jake Bathe, NP-C 12/02/2017 8:59 AM  Phone: [phone]

## 2017-12-02 NOTE — Care Management (Signed)
Patient anticipates discharge to home today. I have delivered Eliquis voucher to assist her with cost. No other RNCM needs

## 2017-12-02 NOTE — Discharge Summary (Signed)
[facility] [place] NAME: Lori Oliver    MR#:  [id number]  [place] OF BIRTH:  [birth year]  DATE OF ADMISSION:  12/01/2017   ADMITTING PHYSICIAN: Harrie Foreman, MD  DATE OF DISCHARGE: 12/02/2017  PRIMARY CARE PHYSICIAN: Arnetha Courser, MD   ADMISSION DIAGNOSIS:  Atrial fibrillation with rapid ventricular response ([place]) [I48.91] Atrial fibrillation with RVR ([place]) [I48.91] DISCHARGE DIAGNOSIS:  Active Problems:   Atrial fibrillation with RVR ([place])  SECONDARY DIAGNOSIS:   Past Medical History:  Diagnosis Date  . Abnormal thyroid function test 05/16/2016  . 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 Decreased libido 05/16/2016  . Hypertension   . IFG (impaired fasting glucose) 05/16/2016  . Morbid obesity ([place]) 06/01/2015   HOSPITAL COURSE:  This is a 46 year old female admitted for atrial fibrillation with rapid ventricular rate. 1.  A. fib: New onset; rapid ventricular rate.  The patient was on a diltiazem drip and received a total of diltiazem 20 mg IV bolus.   Per Dr.Khan, started sotalol and Eliquis.  Heart rate is under controlled and back to normal sinus rhythm.  2.  Hypertension: initially uncontrolled; the patient has not been on medications as an outpatient.   Blood pressure is normal.  3.  Hypothyroidism: Elevated TSH; initiated Synthroid. 4.  Morbid obesity: BMI 40.4; encouraged healthy diet and exercise DISCHARGE CONDITIONS:  Stable, discharge to home today. CONSULTS OBTAINED:  Treatment Team:  Dionisio David, MD DRUG ALLERGIES:  No Known Allergies DISCHARGE MEDICATIONS:   Allergies as of 12/02/2017   No Known Allergies     Medication List    STOP taking these medications   ibuprofen 200 MG tablet Commonly known as:  ADVIL,MOTRIN     TAKE these medications   apixaban 5 MG Tabs tablet Commonly known as:  ELIQUIS Take 1 tablet (5 mg total) by mouth 2 (two) times daily.   Doxylamine-DM 6.25-15 MG/15ML  Liqd Take 1 Dose by mouth as needed (cough).   guaiFENesin 600 MG 12 hr tablet Commonly known as:  MUCINEX Take 600 mg by mouth 2 (two) times daily.   levothyroxine 50 MCG tablet Commonly known as:  SYNTHROID, LEVOTHROID Take 1 tablet (50 mcg total) by mouth daily before breakfast. Start taking on:  12/03/2017   sotalol 80 MG tablet Commonly known as:  BETAPACE Take 1 tablet (80 mg total) by mouth every 12 (twelve) hours.        DISCHARGE INSTRUCTIONS:  See AVS.  If you experience worsening of your admission symptoms, develop shortness of breath, life threatening emergency, suicidal or homicidal thoughts you must seek medical attention immediately by calling 911 or calling your MD immediately  if symptoms less severe.  You Must read complete instructions/literature along with all the possible adverse reactions/side effects for all the Medicines you take and that have been prescribed to you. Take any new Medicines after you have completely understood and accpet all the possible adverse reactions/side effects.   Please note  You were cared for by a hospitalist during your hospital stay. If you have any questions about your discharge medications or the care you received while you were in the hospital after you are discharged, you can call the unit and asked to speak with the hospitalist on call if the hospitalist that took care of you is not available. Once you are discharged, your primary care physician will handle any further medical issues. Please note that NO REFILLS for any discharge  medications will be authorized once you are discharged, as it is imperative that you return to your primary care physician (or establish a relationship with a primary care physician if you do not have one) for your aftercare needs so that they can reassess your need for medications and monitor your lab values.    On the day of Discharge:  VITAL SIGNS:  Blood pressure (!) 125/99, pulse 69, temperature  98.1 F (36.7 C), temperature source Axillary, resp. rate 18, height 5\' 6"  (1.676 m), weight 250 lb 0 oz (113.4 kg), SpO2 98 %. PHYSICAL EXAMINATION:  GENERAL:  46 y.o.-year-old patient lying in the bed with no acute distress.  EYES: Pupils equal, round, reactive to light and accommodation. No scleral icterus. Extraocular muscles intact.  HEENT: Head atraumatic, normocephalic. Oropharynx and nasopharynx clear.  NECK:  Supple, no jugular venous distention. No thyroid enlargement, no tenderness.  LUNGS: Normal breath sounds bilaterally, no wheezing, rales,rhonchi or crepitation. No use of accessory muscles of respiration.  CARDIOVASCULAR: S1, S2 normal. No murmurs, rubs, or gallops.  ABDOMEN: Soft, non-tender, non-distended. Bowel sounds present. No organomegaly or mass.  EXTREMITIES: No pedal edema, cyanosis, or clubbing.  NEUROLOGIC: Cranial nerves II through XII are intact. Muscle strength 5/5 in all extremities. Sensation intact. Gait not checked.  PSYCHIATRIC: The patient is alert and oriented x 3.  SKIN: No obvious rash, lesion, or ulcer.  DATA REVIEW:   CBC Recent Labs  Lab 12/01/17 0126  WBC 10.1  HGB 13.9  HCT 40.1  PLT 262    Chemistries  Recent Labs  Lab 12/01/17 0126 12/02/17 0621  NA 137 135  K 3.5 4.1  CL 104 104  CO2 22 24  GLUCOSE 118* 101*  BUN 12 15  CREATININE 0.88 0.77  CALCIUM 8.9 8.7*  MG  --  2.2  AST 25  --   ALT 16  --   ALKPHOS 78  --   BILITOT 0.6  --      Microbiology Results  Results for orders placed or performed during the hospital encounter of 12/01/17  MRSA PCR Screening     Status: None   Collection Time: 12/01/17  6:19 AM  Result Value Ref Range Status   MRSA by PCR NEGATIVE NEGATIVE Final    Comment:        The GeneXpert MRSA Assay (FDA approved for NASAL specimens only), is one component of a comprehensive MRSA colonization surveillance program. It is not intended to diagnose MRSA infection nor to guide or monitor  treatment for MRSA infections.     RADIOLOGY:  No results found.   Management plans discussed with the patient, family and they are in agreement.  CODE STATUS: Full Code   TOTAL TIME TAKING CARE OF THIS PATIENT: 33 minutes.    Demetrios Loll M.D on 12/02/2017 at 2:43 PM  Between 7am to 6pm - Pager - [phone]  After 6pm go to [email or web address] - Proofreader  [facility]  Office  [phone]  CC: Primary care physician; Arnetha Courser, MD   Note: This dictation was prepared with Dragon dictation along with smaller phrase technology. Any transcriptional errors that result from this process are unintentional.

## 2017-12-02 NOTE — Progress Notes (Signed)
Discharge instructions reviewed with patient and her husband who was at the bedside. Prescriptions for apixiban, synthroid, and betapace given to the patient. She and her husband verbalized their understanding of instructions via teachback method. Work note provided for patient as well. She was taken to the visitor entrance via wheelchair by Lelon Mast, NT.

## 2017-12-02 NOTE — Progress Notes (Signed)
*  PRELIMINARY RESULTS* Echocardiogram 2D Echocardiogram has been performed.  Lori Oliver 12/02/2017, 9:49 AM

## 2017-12-02 NOTE — Discharge Instructions (Signed)
Heart healthy diet. Information on my medicine - ELIQUIS (apixaban)  This medication education was reviewed with me or my healthcare representative as part of my discharge preparation.  The pharmacist that spoke with me during my hospital stay was:  Dwayne Begay L, [place]  Why was Eliquis prescribed for you? Eliquis was prescribed for you to reduce the risk of forming blood clots that can cause a stroke if you have a medical condition called atrial fibrillation (a type of irregular heartbeat) OR to reduce the risk of a blood clots forming after orthopedic surgery.  What do You need to know about Eliquis ? Take your Eliquis TWICE DAILY - one tablet in the morning and one tablet in the evening with or without food.  It would be best to take the doses about the same time each day.  If you have difficulty swallowing the tablet whole please discuss with your pharmacist how to take the medication safely.  Take Eliquis exactly as prescribed by your doctor and DO NOT stop taking Eliquis without talking to the doctor who prescribed the medication.  Stopping may increase your risk of developing a new clot or stroke.  Refill your prescription before you run out.  After discharge, you should have regular check-up appointments with your healthcare provider that is prescribing your Eliquis.  In the future your dose may need to be changed if your kidney function or weight changes by a significant amount or as you get older.  What do you do if you miss a dose? If you miss a dose, take it as soon as you remember on the same day and resume taking twice daily.  Do not take more than one dose of ELIQUIS at the same time.  Important Safety Information A possible side effect of Eliquis is bleeding. You should call your healthcare provider right away if you experience any of the following: ? Bleeding from an injury or your nose that does not stop. ? Unusual colored urine (red or dark brown) or unusual  colored stools (red or black). ? Unusual bruising for unknown reasons. ? A serious fall or if you hit your head (even if there is no bleeding).  Some medicines may interact with Eliquis and might increase your risk of bleeding or clotting while on Eliquis. To help avoid this, consult your healthcare provider or pharmacist prior to using any new prescription or non-prescription medications, including herbals, vitamins, non-steroidal anti-inflammatory drugs (NSAIDs) and supplements.  This website has more information on Eliquis (apixaban): [email or web address].

## 2017-12-02 NOTE — Progress Notes (Signed)
VS reviewed  Ok to transfer to gen med floor with [place]  Will sign off    Corrin Parker, M.D.  [facility]  Medical Director [place] Director [facility] Cardio-Pulmonary Department

## 2017-12-02 NOTE — Progress Notes (Signed)
[place] for electrolyte management   Pharmacy consulted for electrolyte management for 46 yo female admitted with atrial fibrillation.   Plan:   No replacement warranted at this time. Will recheck electolytes with am labs.   No Known Allergies  Patient Measurements: Height: 5\' 6"  (167.6 cm) Weight: 250 lb 0 oz (113.4 kg) IBW/kg (Calculated) : 59.3  Vital Signs: Temp: 98.1 F (36.7 C) (12/18 0800) Temp Source: Axillary (12/18 0800) BP: 125/99 (12/18 1000) Pulse Rate: 69 (12/18 1200) Intake/Output from previous day: 12/17 0701 - 12/18 0700 In: 34.2 [I.V.:34.2] Out: -  Intake/Output from this shift: Total I/O In: -  Out: 1000 [Urine:1000]  Labs: Recent Labs    12/01/17 0126 12/02/17 0621  WBC 10.1  --   HGB 13.9  --   HCT 40.1  --   PLT 262  --   CREATININE 0.88 0.77  MG  --  2.2  ALBUMIN 3.9  --   PROT 8.0  --   AST 25  --   ALT 16  --   ALKPHOS 78  --   BILITOT 0.6  --    Estimated Creatinine Clearance: 113.4 mL/min (by C-G formula based on SCr of 0.77 mg/dL).    Pharmacy will continue to monitor and adjust per consult.   Simpson,Michael L 12/02/2017,1:31 PM

## 2017-12-06 ENCOUNTER — Emergency Department: Payer: Managed Care, Other (non HMO)

## 2017-12-06 ENCOUNTER — Emergency Department
Admission: EM | Admit: 2017-12-06 | Discharge: 2017-12-06 | Disposition: A | Discharge status: Home / Self Care | Payer: Managed Care, Other (non HMO) | Attending: Emergency Medicine | Admitting: Emergency Medicine

## 2017-12-06 ENCOUNTER — Other Ambulatory Visit: Payer: Self-pay

## 2017-12-06 ENCOUNTER — Encounter: Payer: Self-pay | Admitting: Emergency Medicine

## 2017-12-06 DIAGNOSIS — I10 Essential (primary) hypertension: Secondary | ICD-10-CM | POA: Diagnosis not present

## 2017-12-06 DIAGNOSIS — Z79899 Other long term (current) drug therapy: Secondary | ICD-10-CM | POA: Diagnosis not present

## 2017-12-06 DIAGNOSIS — Z7901 Long term (current) use of anticoagulants: Secondary | ICD-10-CM | POA: Insufficient documentation

## 2017-12-06 DIAGNOSIS — R002 Palpitations: Secondary | ICD-10-CM

## 2017-12-06 LAB — CBC
HEMATOCRIT: 39.8 % (ref 35.0–47.0)
Hemoglobin: 13.3 g/dL (ref 12.0–16.0)
MCH: 32.4 pg (ref 26.0–34.0)
MCHC: 33.5 g/dL (ref 32.0–36.0)
MCV: 96.8 fL (ref 80.0–100.0)
Platelets: 283 10*3/uL (ref 150–440)
RBC: 4.11 MIL/uL (ref 3.80–5.20)
RDW: 13 % (ref 11.5–14.5)
WBC: 6.1 10*3/uL (ref 3.6–11.0)

## 2017-12-06 LAB — BASIC METABOLIC PANEL
Anion gap: 7 (ref 5–15)
BUN: 13 mg/dL (ref 6–20)
CALCIUM: 9 mg/dL (ref 8.9–10.3)
CO2: 24 mmol/L (ref 22–32)
Chloride: 104 mmol/L (ref 101–111)
Creatinine, Ser: 0.89 mg/dL (ref 0.44–1.00)
GFR calc Af Amer: >60 mL/min (ref >60)
GLUCOSE: 96 mg/dL (ref 65–99)
POTASSIUM: 3.9 mmol/L (ref 3.5–5.1)
Sodium: 135 mmol/L (ref 135–145)

## 2017-12-06 LAB — TROPONIN I: Troponin I: <0.03 ng/mL (ref <0.03)

## 2017-12-06 NOTE — ED Notes (Signed)
Pt stating that she did not sleep well last night. Pt stating that she felt like she had a "gurgling sound in my chest" for several hours today. Pt stating that she was recently dx with a-fib. Pt denying n/v or SOB. Pt denying fevers. Pt stating that the sensation she experienced this morning was different from when she was dx with a-fib. Pt stating that she was started on some medication for her thyroid but that she has not taken any because it was out of stock.

## 2017-12-06 NOTE — ED Notes (Signed)
Pt discharged - signature pad not working. Unable to sign. Dr Cinda Quest spoke with patient extensively answering her questions. Pt will call pcp and follow up with them.

## 2017-12-06 NOTE — ED Triage Notes (Addendum)
States feels fluttering feeling "like air bubbles in my chest" x past 2 hours. Denies pain. States recently diagnosed with a fib. Pulse regular and about 70 at triage onset.

## 2017-12-06 NOTE — Discharge Instructions (Signed)
right now or your blood work and EKG looked normal. Please continue follow-up with your regular doctor and her cardiologist complete the workup for your episode of atrial fibrillation which her head. Currently her not having atrial fibrillation. Please return as needed.

## 2017-12-06 NOTE — ED Provider Notes (Signed)
[facility] Emergency Department Provider Note   ____________________________________________   First MD Initiated Contact with Patient 12/06/17 0801     (approximate)  I have reviewed the triage vital signs and the nursing notes.   HISTORY  Chief Complaint Palpitations   HPI Lori Oliver is a 46 y.o. female Who comes in reporting she heard and felt a bubbling in her chest under the left breast this morning. She was recently diagnosed with atrial fibrillation after having taken a lot of Sudafed and cough medicine. She did not have any shortness of breath chest pain and sweating or anything although she did feel hot for little bit.patient reports she is taking all her medicines except thyroid medicine which was not available at the pharmacy when she got her prescriptions filled   Past Medical History:  Diagnosis Date  . Abnormal thyroid function test 05/16/2016  . 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 Decreased libido 05/16/2016  . Hypertension   . IFG (impaired fasting glucose) 05/16/2016  . Morbid obesity ([place]) 06/01/2015    Patient Active Problem List   Diagnosis Date Noted  . Atrial fibrillation with RVR ([place]) 12/01/2017  . Vitamin D deficiency 07/08/2017  . Lower back pain 07/07/2017  . Screen for colon cancer 07/07/2017  . Morbid obesity ([place]) 07/07/2017  . Bilateral pneumonia 01/29/2017  . Hypertension 07/11/2016  . Preventative health care 05/16/2016  . IFG (impaired fasting glucose) 05/16/2016  . 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 Abnormal thyroid function test 05/16/2016  . Decreased libido 05/16/2016  . Breast cancer screening 05/16/2016  . Breast lump on left side at 5 o'clock position 05/16/2016  . Contraception management 05/16/2016  . Surveillance for birth control, oral contraceptives 02/12/2016  . Bilateral thoracic back pain 06/01/2015  . Blood pressure elevated without history of HTN 06/01/2015  . History of breast  lump 06/01/2015  . History of abnormal cervical Pap smear 06/01/2015    Past Surgical History:  Procedure Laterality Date  . BREAST BIOPSY Right 2015   benign  . HAMMER TOE SURGERY      Prior to Admission medications   Medication Sig Start Date End Date Taking? Authorizing Provider  apixaban (ELIQUIS) 5 MG TABS tablet Take 1 tablet (5 mg total) by mouth 2 (two) times daily. 12/02/17   Demetrios Loll, MD  Doxylamine-DM 6.25-15 MG/15ML LIQD Take 1 Dose by mouth as needed (cough).     [provider]  guaiFENesin (MUCINEX) 600 MG 12 hr tablet Take 600 mg by mouth 2 (two) times daily.    [provider]  levothyroxine (SYNTHROID, LEVOTHROID) 50 MCG tablet Take 1 tablet (50 mcg total) by mouth daily before breakfast. 12/03/17   Demetrios Loll, MD  sotalol (BETAPACE) 80 MG tablet Take 1 tablet (80 mg total) by mouth every 12 (twelve) hours. 12/02/17   Demetrios Loll, MD    Allergies Patient has no known allergies.  Family History  Problem Relation Age of Onset  . Hypertension Mother   . Heart disease Mother   . Arthritis Mother   . Depression Mother   . Stroke Maternal Grandmother     Social History Social History   Tobacco Use  . Smoking status: Never Smoker  . Smokeless tobacco: Never Used  Substance Use Topics  . Alcohol use: No    Alcohol/week: 0.0 oz  . Drug use: No    Review of Systems  Constitutional: No fever/chills Eyes: No visual changes. ENT: No sore throat.  Cardiovascular: Denies chest pain. Respiratory: Denies shortness of breath. Gastrointestinal: No abdominal pain.  No nausea, no vomiting.  No diarrhea.  No constipation. Genitourinary: Negative for dysuria. Musculoskeletal: Negative for back pain. Skin: Negative for rash. Neurological: Negative for headaches, focal weakness   ____________________________________________   PHYSICAL EXAM:  VITAL SIGNS: ED Triage Vitals  Enc Vitals Group     BP 12/06/17 0752 (!) 142/81     Pulse Rate  12/06/17 0752 70     Resp 12/06/17 0752 20     Temp 12/06/17 0752 98.3 F (36.8 C)     Temp Source 12/06/17 0752 Oral     SpO2 12/06/17 0752 98 %     Weight 12/06/17 0752 250 lb (113.4 kg)     Height 12/06/17 0752 5\' 6"  (1.676 m)     Head Circumference --      Peak Flow --      Pain Score 12/06/17 0813 7     Pain Loc --      Pain Edu? --      Excl. in [place]? --     Constitutional: Alert and oriented. Well appearing and in no acute distress. Eyes: Conjunctivae are normal.  Head: Atraumatic. Nose: No congestion/rhinnorhea. Mouth/Throat: Mucous membranes are moist.  Oropharynx non-erythematous. Neck: No stridor.  Cardiovascular: Normal rate, regular rhythm. Grossly normal heart sounds.  Good peripheral circulation. Respiratory: Normal respiratory effort.  No retractions. Lungs CTAB. Gastrointestinal: Soft and nontender. No distention. No abdominal bruits. No CVA tenderness. Musculoskeletal: No lower extremity tenderness nor edema.  No joint effusions. Neurologic:  Normal speech and language. No gross focal neurologic deficits are appreciated. No gait instability. Skin:  Skin is warm, dry and intact. No rash noted. Psychiatric: Mood and affect are normal. Speech and behavior are normal.  ____________________________________________   LABS (all labs ordered are listed, but only abnormal results are displayed)  Labs Reviewed  BASIC METABOLIC PANEL  CBC  TROPONIN I   ____________________________________________  EKG  EKG read and interpreted by me shows normal sinus rhythm rate of 70 normal axis no acute ST-T wave changes________________________________________  RADIOLOGY  Dg Chest Portable 1 View  Result Date: 12/06/2017 CLINICAL DATA:  [place] in chest.  Feeling anxious. EXAM: PORTABLE CHEST 1 VIEW COMPARISON:  12/01/2017 FINDINGS: The heart size and mediastinal contours are within normal limits. Both lungs are clear. The visualized skeletal structures are unremarkable.  IMPRESSION: No active disease. Electronically Signed   By: Kathreen Devoid   On: 12/06/2017 08:54   chest x-ray shows no acute disease ____________________________________________   PROCEDURES  Procedure(s) performed:   Procedures  Critical Care performed:  ____________________________________________   INITIAL IMPRESSION / ASSESSMENT AND PLAN / ED COURSE  patient was in atrial fibrillation before. She is not currently in atrial fibrillation lab work is essentially normal. I will discharge her.she can follow-up with her regular doctor and her cardiologist as planned. She has a sleep study scheduled.     ____________________________________________   FINAL CLINICAL IMPRESSION(S) / ED DIAGNOSES  Final diagnoses:  Palpitations     ED Discharge Orders    None       Note:  This document was prepared using Dragon voice recognition software and may include unintentional dictation errors.    Nena Polio, MD 12/06/17 1051

## 2017-12-15 ENCOUNTER — Encounter: Payer: Self-pay | Admitting: Family Medicine

## 2017-12-15 ENCOUNTER — Ambulatory Visit (INDEPENDENT_AMBULATORY_CARE_PROVIDER_SITE_OTHER): Payer: Managed Care, Other (non HMO) | Admitting: Family Medicine

## 2017-12-15 DIAGNOSIS — I4891 Unspecified atrial fibrillation: Secondary | ICD-10-CM

## 2017-12-15 DIAGNOSIS — R946 Abnormal results of thyroid function studies: Secondary | ICD-10-CM | POA: Diagnosis not present

## 2017-12-15 NOTE — Assessment & Plan Note (Addendum)
A single episode that sounds like it was precipitated by cold medicine; we discussed risk of the blood thinner and taking this for many years if this is unlikely to recur; I did encourage her to avoid decongestants and stimulant diet pills; I do not have the records from her recent cardiology visit; her CHA2D2SVASC score is only a 1, so I'd like to see something that documents why she needs to be on Eliquis; she was discharged on Eliquis and maintained on Eliquis by the heart doctor at hospital follow-up; will continue this for now, but I am in favor of getting cardiology records; she would like a second opinion from another cardiologist to discuss the anticoagulant, and I'm in favor of that so a referral has been placed

## 2017-12-15 NOTE — Patient Instructions (Addendum)
Try to use PLAIN allergy medicine without the decongestant Avoid: phenylephrine, phenylpropanolamine, and pseudoephredine I'll recommend that you stay on the Eliquis for now until you see the cardiologist If you have to stop, then do take a 325 mg aspirin daily  You might read about Saxenda (injectable) and Belviq (pill) If you would like a referral to either a nutritionist or the bariatric surgeon, just call me

## 2017-12-15 NOTE — Assessment & Plan Note (Addendum)
I personally told the patient to NOT take the thyroid medicine and we'll just recheck that in 8-12 weeks; the TSH was not high enough in my opinion to warrant initiation of thyroid medicine, especially with recent atrial fibrillation episode

## 2017-12-15 NOTE — Progress Notes (Signed)
BP 132/84   Pulse 60   Temp 98 F (36.7 C) (Oral)   Resp 14   Wt 257 lb 3.2 oz (116.7 kg)   SpO2 99%   BMI 41.51 kg/m    Subjective:    Patient ID: Lori Oliver, female    DOB: [birth year], 46 y.o.   MRN: [id number]  HPI: Lori Oliver is a 46 y.o. female  Chief Complaint  Patient presents with  . Hospitalization Follow-up    Cardiac problems    HPI Patient is here for hospital f/u She was found to be in atrial fibrillation with RVR on Dec 01, 2017 She was admitted to the hospital on Dec 17th and discharged to home on December 18th While in the hospital, she was treated with IV diltiazem; started on sotalol and Eliquis and she was back in sinu rhythm prior to discharge Dr. Neoma Laming was consulted (cardiologist) Her thyroid was abnormal and she was started on Synthroid She went back to the ER on Dec 22nd after having palpitations She was anxious, very stressful  She said she was having a cold, tried to doctor it herself She does not like to take a lot of medicine They put her on blood thinners She never started taking the thyroid medicine; wanted to talk to me first Tried different types of mucinex, different cough syrup, then sudafed, then old antibiotics No problems with aspirin  Mother has had cardiac arrrhythmias  Patient has a sleep study at the end of January 25th, then a stress test on January 15th; she snores but no sleep apnea; normal H/H; she isn't sure she needs all of this testing  Reviewed labs from earlier; trop I and BNP normal  She has struggled with her weight; thinking about lap band surgery  Depression screen [facility] 2/9 12/15/2017 07/07/2017 01/29/2017 09/24/2016 05/16/2016  Decreased Interest 0 0 0 0 0  Down, Depressed, Hopeless 1 1 0 1 1  PHQ - 2 Score 1 1 0 1 1    Relevant past medical, surgical, family and social history reviewed Past Medical History:  Diagnosis Date  . Abnormal thyroid function test 05/16/2016  . Breast hypertrophy in  female 05/16/2016  . Decreased libido 05/16/2016  . Hypertension   . IFG (impaired fasting glucose) 05/16/2016  . Morbid obesity ([place]) 06/01/2015   Past Surgical History:  Procedure Laterality Date  . BREAST BIOPSY Right 2015   benign  . HAMMER TOE SURGERY     Family History  Problem Relation Age of Onset  . Hypertension Mother   . Heart disease Mother   . Arthritis Mother   . Depression Mother   . Stroke Maternal Grandmother    Social History   Tobacco Use  . Smoking status: Never Smoker  . Smokeless tobacco: Never Used  Substance Use Topics  . Alcohol use: No    Alcohol/week: 0.0 oz  . Drug use: No    Interim medical history since last visit reviewed. Allergies and medications reviewed  Review of Systems Per HPI unless specifically indicated above     Objective:    BP 132/84   Pulse 60   Temp 98 F (36.7 C) (Oral)   Resp 14   Wt 257 lb 3.2 oz (116.7 kg)   SpO2 99%   BMI 41.51 kg/m   Wt Readings from Last 3 Encounters:  12/15/17 257 lb 3.2 oz (116.7 kg)  12/06/17 250 lb (113.4 kg)  12/01/17 250 lb 0 oz (113.4 kg)  Physical Exam  Constitutional: She appears well-developed and well-nourished. No distress.  HENT:  Head: Normocephalic and atraumatic.  Eyes: EOM are normal. No scleral icterus.  No proptosis or exophthalmos  Neck: No thyromegaly present.  Cardiovascular: Normal rate, regular rhythm and normal heart sounds.  No extrasystoles are present.  No murmur heard. Pulmonary/Chest: Effort normal and breath sounds normal. No respiratory distress. She has no wheezes.  Abdominal: She exhibits no distension.  Musculoskeletal: Normal range of motion. She exhibits no edema.  Neurological: She is alert. She displays no tremor.  Skin: Skin is warm and dry. She is not diaphoretic. No pallor.  Psychiatric: She has a normal mood and affect. Her behavior is normal. Judgment and thought content normal. Her mood appears not anxious.    Results for orders placed or  performed during the hospital encounter of 40/10/27  Basic metabolic panel  Result Value Ref Range   Sodium 135 135 - 145 mmol/L   Potassium 3.9 3.5 - 5.1 mmol/L   Chloride 104 101 - 111 mmol/L   CO2 24 22 - 32 mmol/L   Glucose, Bld 96 65 - 99 mg/dL   BUN 13 6 - 20 mg/dL   Creatinine, Ser 0.89 0.44 - 1.00 mg/dL   Calcium 9.0 8.9 - 10.3 mg/dL   GFR calc non Af Amer >60 >60 mL/min   GFR calc Af Amer >60 >60 mL/min   Anion gap 7 5 - 15  CBC  Result Value Ref Range   WBC 6.1 3.6 - 11.0 K/uL   RBC 4.11 3.80 - 5.20 MIL/uL   Hemoglobin 13.3 12.0 - 16.0 g/dL   HCT 39.8 35.0 - 47.0 %   MCV 96.8 80.0 - 100.0 fL   MCH 32.4 26.0 - 34.0 pg   MCHC 33.5 32.0 - 36.0 g/dL   RDW 13.0 11.5 - 14.5 %   Platelets 283 150 - 440 K/uL  Troponin I  Result Value Ref Range   Troponin I <0.03 <0.03 ng/mL      Assessment & Plan:   Problem List Items Addressed This Visit      Cardiovascular and Mediastinum   Atrial fibrillation with RVR (HCC)    A single episode that sounds like it was precipitated by cold medicine; we discussed risk of the blood thinner and taking this for many years if this is unlikely to recur; I did encourage her to avoid decongestants and stimulant diet pills; I do not have the records from her recent cardiology visit; her CHA2D2SVASC score is only a 1, so I'd like to see something that documents why she needs to be on Eliquis; she was discharged on Eliquis and maintained on Eliquis by the heart doctor at hospital follow-up; will continue this for now, but I am in favor of getting cardiology records; she would like a second opinion from another cardiologist to discuss the anticoagulant, and I'm in favor of that so a referral has been placed      Relevant Orders   Ambulatory referral to Cardiology     Other   Morbid obesity ([place])    She has struggled with her weight, but is not taking any stimulant diet pills; I do NOT prescribe phentermine; I in favor of her considering bariatric  surgery in the future, but we'll get her through this recent a-fib episode, 2nd cardiology appointment, then revisit plans for weight loss      Abnormal thyroid function test    I personally told the patient to NOT  take the thyroid medicine and we'll just recheck that in 8-12 weeks; the TSH was not high enough in my opinion to warrant initiation of thyroid medicine, especially with recent atrial fibrillation episode          Follow up plan: No Follow-up on file.  An after-visit summary was printed and given to the patient at [facility].  Please see the patient instructions which may contain other information and recommendations beyond what is mentioned above in the assessment and plan.  No orders of the defined types were placed in this encounter.   Orders Placed This Encounter  Procedures  . Ambulatory referral to Cardiology

## 2017-12-18 NOTE — Assessment & Plan Note (Signed)
She has struggled with her weight, but is not taking any stimulant diet pills; I do NOT prescribe phentermine; I in favor of her considering bariatric surgery in the future, but we'll get her through this recent a-fib episode, 2nd cardiology appointment, then revisit plans for weight loss

## 2017-12-21 ENCOUNTER — Encounter: Payer: Self-pay | Admitting: Emergency Medicine

## 2017-12-21 ENCOUNTER — Other Ambulatory Visit: Payer: Self-pay

## 2017-12-21 ENCOUNTER — Emergency Department: Payer: Managed Care, Other (non HMO)

## 2017-12-21 ENCOUNTER — Emergency Department
Admission: EM | Admit: 2017-12-21 | Discharge: 2017-12-21 | Disposition: A | Discharge status: Home / Self Care | Payer: Managed Care, Other (non HMO) | Attending: Emergency Medicine | Admitting: Emergency Medicine

## 2017-12-21 DIAGNOSIS — R002 Palpitations: Secondary | ICD-10-CM | POA: Diagnosis not present

## 2017-12-21 DIAGNOSIS — R0602 Shortness of breath: Secondary | ICD-10-CM

## 2017-12-21 DIAGNOSIS — I4891 Unspecified atrial fibrillation: Secondary | ICD-10-CM | POA: Diagnosis not present

## 2017-12-21 DIAGNOSIS — Z7901 Long term (current) use of anticoagulants: Secondary | ICD-10-CM | POA: Diagnosis not present

## 2017-12-21 DIAGNOSIS — Z79899 Other long term (current) drug therapy: Secondary | ICD-10-CM | POA: Diagnosis not present

## 2017-12-21 DIAGNOSIS — I10 Essential (primary) hypertension: Secondary | ICD-10-CM | POA: Diagnosis not present

## 2017-12-21 HISTORY — DX: Unspecified atrial fibrillation: I48.91

## 2017-12-21 LAB — CBC
HCT: 38.5 % (ref 35.0–47.0)
Hemoglobin: 13 g/dL (ref 12.0–16.0)
MCH: 32.4 pg (ref 26.0–34.0)
MCHC: 33.7 g/dL (ref 32.0–36.0)
MCV: 96 fL (ref 80.0–100.0)
PLATELETS: 255 10*3/uL (ref 150–440)
RBC: 4.01 MIL/uL (ref 3.80–5.20)
RDW: 12.9 % (ref 11.5–14.5)
WBC: 7 10*3/uL (ref 3.6–11.0)

## 2017-12-21 LAB — BASIC METABOLIC PANEL
Anion gap: 7 (ref 5–15)
BUN: 11 mg/dL (ref 6–20)
CALCIUM: 8.8 mg/dL — AB (ref 8.9–10.3)
CHLORIDE: 105 mmol/L (ref 101–111)
CO2: 26 mmol/L (ref 22–32)
CREATININE: 0.86 mg/dL (ref 0.44–1.00)
GFR calc Af Amer: >60 mL/min (ref >60)
GFR calc non Af Amer: >60 mL/min (ref >60)
GLUCOSE: 135 mg/dL — AB (ref 65–99)
Potassium: 3.5 mmol/L (ref 3.5–5.1)
Sodium: 138 mmol/L (ref 135–145)

## 2017-12-21 LAB — TROPONIN I

## 2017-12-21 NOTE — ED Triage Notes (Signed)
Pt c/o shortness of breath for about 45 minutes; was diagnosed with A-fib a few weeks ago; says she did not take any medication Saturday because "it was my birthday"; admits to drinking one beer; says currently she's feeling "jittery"; "fluttering" in her chest;

## 2017-12-21 NOTE — Discharge Instructions (Signed)
1.  Please take your medicines daily until you see your cardiologist.  Do not take yourself off the medications. 2.  Return to the ER for worsening symptoms, persistent vomiting, difficulty breathing or other concerns.

## 2017-12-21 NOTE — ED Provider Notes (Signed)
[facility] Emergency Department Provider Note   ____________________________________________   First MD Initiated Contact with Patient 12/21/17 [phone]     (approximate)  I have reviewed the triage vital signs and the nursing notes.   HISTORY  Chief Complaint Shortness of Breath    HPI Lori Oliver is a 47 y.o. female who presents to the ED from home with a chief complaint of palpitations, shortness of breath and anxiety.  Patient was diagnosed with atrial fibrillation several weeks ago, having a hard time coming to grips with her new medical condition, and did not take her Sotalol or Eliquis yesterday because she was celebrating her birthday.  Said she had an alcoholic drink to celebrate.  Subsequently she felt jittery, nervous, fluttering in her chest and shortness of breath for about 45 minutes.  Symptoms have since resolved.  Currently patient denies headache, vision changes, chest pain, shortness of breath, abdominal pain, nausea, vomiting, diarrhea.  Denies recent travel or trauma.   Past Medical History:  Diagnosis Date  . Abnormal thyroid function test 05/16/2016  . Atrial fibrillation ([place])   . 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 Decreased libido 05/16/2016  . Hypertension   . IFG (impaired fasting glucose) 05/16/2016  . Morbid obesity ([place]) 06/01/2015    Patient Active Problem List   Diagnosis Date Noted  . Atrial fibrillation with RVR ([place]) 12/01/2017  . Vitamin D deficiency 07/08/2017  . Lower back pain 07/07/2017  . Screen for colon cancer 07/07/2017  . Morbid obesity ([place]) 07/07/2017  . Hypertension 07/11/2016  . Preventative health care 05/16/2016  . IFG (impaired fasting glucose) 05/16/2016  . 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 Abnormal thyroid function test 05/16/2016  . Decreased libido 05/16/2016  . Breast cancer screening 05/16/2016  . Breast lump on left side at 5 o'clock position 05/16/2016  . Contraception  management 05/16/2016  . Surveillance for birth control, oral contraceptives 02/12/2016  . Bilateral thoracic back pain 06/01/2015  . Blood pressure elevated without history of HTN 06/01/2015  . History of breast lump 06/01/2015  . History of abnormal cervical Pap smear 06/01/2015    Past Surgical History:  Procedure Laterality Date  . BREAST BIOPSY Right 2015   benign  . HAMMER TOE SURGERY      Prior to Admission medications   Medication Sig Start Date End Date Taking? Authorizing Provider  apixaban (ELIQUIS) 5 MG TABS tablet Take 1 tablet (5 mg total) by mouth 2 (two) times daily. 12/02/17  Yes Demetrios Loll, MD  sotalol (BETAPACE) 80 MG tablet Take 1 tablet (80 mg total) by mouth every 12 (twelve) hours. 12/02/17  Yes Demetrios Loll, MD    Allergies Patient has no known allergies.  Family History  Problem Relation Age of Onset  . Hypertension Mother   . Heart disease Mother   . Arthritis Mother   . Depression Mother   . Stroke Maternal Grandmother     Social History Social History   Tobacco Use  . Smoking status: Never Smoker  . Smokeless tobacco: Never Used  Substance Use Topics  . Alcohol use: Yes    Alcohol/week: 0.0 oz    Comment: 1 beer today  . Drug use: No    Review of Systems  Constitutional: No fever/chills. Eyes: No visual changes. ENT: No sore throat. Cardiovascular: Positive for palpitations.  Denies chest pain. Respiratory: Positive for shortness of breath. Gastrointestinal: No abdominal pain.  No nausea, no vomiting.  No diarrhea.  No constipation. Genitourinary: Negative for dysuria. Musculoskeletal: Negative for back pain. Skin: Negative for rash. Neurological: Negative for headaches, focal weakness or numbness.   ____________________________________________   PHYSICAL EXAM:  VITAL SIGNS: ED Triage Vitals  Enc Vitals Group     BP 12/21/17 0416 (!) 177/84     Pulse Rate 12/21/17 0416 80     Resp 12/21/17 0416 18     Temp 12/21/17 0416  98.6 F (37 C)     Temp Source 12/21/17 0416 Oral     SpO2 12/21/17 0416 100 %     Weight 12/21/17 0417 257 lb (116.6 kg)     Height 12/21/17 0417 5\' 6"  (1.676 m)     Head Circumference --      Peak Flow --      Pain Score 12/21/17 0416 0     Pain Loc --      Pain Edu? --      Excl. in [place]? --     Constitutional: Alert and oriented. Well appearing and in no acute distress. Eyes: Conjunctivae are normal. PERRL. EOMI. Head: Atraumatic. Nose: No congestion/rhinnorhea. Mouth/Throat: Mucous membranes are moist.  Oropharynx non-erythematous. Neck: No stridor.  No carotid bruits. Cardiovascular: Normal rate, regular rhythm. Grossly normal heart sounds.  Good peripheral circulation. Respiratory: Normal respiratory effort.  No retractions. Lungs CTAB. Gastrointestinal: Soft and nontender. No distention. No abdominal bruits. No CVA tenderness. Musculoskeletal: No lower extremity tenderness nor edema.  No joint effusions. Neurologic:  Normal speech and language. No gross focal neurologic deficits are appreciated. No gait instability. Skin:  Skin is warm, dry and intact. No rash noted. Psychiatric: Mood and affect are just and tearful. Speech and behavior are normal.  ____________________________________________   LABS (all labs ordered are listed, but only abnormal results are displayed)  Labs Reviewed  BASIC METABOLIC PANEL - Abnormal; Notable for the following components:      Result Value   Glucose, Bld 135 (*)    Calcium 8.8 (*)    All other components within normal limits  CBC  TROPONIN I   ____________________________________________  EKG  ED ECG REPORT I, SUNG,JADE J, the attending physician, personally viewed and interpreted this ECG.   Date: 12/21/2017  EKG Time: 0416  Rate: 69  Rhythm: normal EKG, normal sinus rhythm  Axis: Normal  Intervals:none  ST&T Change: Nonspecific  ____________________________________________  RADIOLOGY  Dg Chest 2 View  Result  Date: 12/21/2017 CLINICAL DATA:  Shortness of breath today. EXAM: CHEST  2 VIEW COMPARISON:  Radiograph 12/06/2017.  CT 02/01/2017 FINDINGS: The cardiomediastinal contours are normal. Subsegmental atelectasis at the left lung base. Pulmonary vasculature is normal. No consolidation, pleural effusion, or pneumothorax. No acute osseous abnormalities are seen. IMPRESSION: Minimal left basilar atelectasis.  Otherwise no acute abnormality. Electronically Signed   By: Jeb Levering M.D.   On: 12/21/2017 04:59    ____________________________________________   PROCEDURES  Procedure(s) performed: None  Procedures  Critical Care performed: No  ____________________________________________   INITIAL IMPRESSION / ASSESSMENT AND PLAN / ED COURSE  As part of my medical decision making, I reviewed the following data within the [place] History obtained from family, Nursing notes reviewed and incorporated, Labs reviewed, EKG interpreted, Old chart reviewed, Radiograph reviewed and Notes from prior ED visits.   47 year old female recently diagnosed with atrial fibrillation, started on sotalol and Eliquis, who presents with a brief period of palpitations and shortness of breath in the setting of not taking her medications and drinking  alcohol to celebrate her birthday. Differential includes, but is not limited to, viral syndrome, bronchitis including COPD exacerbation, pneumonia, reactive airway disease including asthma, CHF including exacerbation with or without pulmonary/interstitial edema, pneumothorax, ACS, thoracic trauma, CAD, aortic dissection, atrial fibrillation with rapid ventricular rate, and pulmonary embolism.  EKG, laboratory and imaging results are unremarkable.  Patient is tearful; states she does not wish to take medicines at all, and is having a hard time digesting her new diagnosis of atrial fibrillation.  Reviewed patient's chart and saw that her initial rate was greater  than 200, and she required a period of time on diltiazem drip before conversion to normal sinus rhythm.  She has an upcoming appointment with her cardiologist for stress test and further evaluation.  I strongly encouraged her to continue her medications and to not self discontinued them as that will cause worsening symptoms.  I did offer her a low-dose anxiolytic which she strongly declines because she does not wish to take additional medicines.  Patient has her medicines with her and is taking a dose of her sotalol as well as Eliquis now.  She also mentions that she will be unable to afford Eliquis even with her insurance given the great financial cost.  Will provide her with prescription coupon.  Encouraged her to discuss this with her cardiologist as there are other options for anticoagulation.  Strict return precautions given.  Patient and spouse verbalize understanding and agree with plan of care.      ____________________________________________   FINAL CLINICAL IMPRESSION(S) / ED DIAGNOSES  Final diagnoses:  Shortness of breath  Palpitations  Atrial fibrillation, unspecified type [facility])     ED Discharge Orders    None       Note:  This document was prepared using Dragon voice recognition software and may include unintentional dictation errors.    Paulette Blanch, MD 12/21/17 [phone]

## 2017-12-23 ENCOUNTER — Encounter: Payer: Self-pay | Admitting: Emergency Medicine

## 2017-12-23 ENCOUNTER — Emergency Department: Payer: Managed Care, Other (non HMO)

## 2017-12-23 ENCOUNTER — Emergency Department
Admission: EM | Admit: 2017-12-23 | Discharge: 2017-12-23 | Disposition: A | Discharge status: Home / Self Care | Payer: Managed Care, Other (non HMO) | Attending: Emergency Medicine | Admitting: Emergency Medicine

## 2017-12-23 ENCOUNTER — Other Ambulatory Visit: Payer: Self-pay

## 2017-12-23 DIAGNOSIS — Z79899 Other long term (current) drug therapy: Secondary | ICD-10-CM | POA: Diagnosis not present

## 2017-12-23 DIAGNOSIS — I10 Essential (primary) hypertension: Secondary | ICD-10-CM | POA: Diagnosis not present

## 2017-12-23 DIAGNOSIS — R079 Chest pain, unspecified: Secondary | ICD-10-CM | POA: Diagnosis not present

## 2017-12-23 DIAGNOSIS — Z7901 Long term (current) use of anticoagulants: Secondary | ICD-10-CM | POA: Insufficient documentation

## 2017-12-23 LAB — BASIC METABOLIC PANEL
Anion gap: 7 (ref 5–15)
BUN: 12 mg/dL (ref 6–20)
CALCIUM: 9 mg/dL (ref 8.9–10.3)
CHLORIDE: 105 mmol/L (ref 101–111)
CO2: 26 mmol/L (ref 22–32)
CREATININE: 0.9 mg/dL (ref 0.44–1.00)
GFR calc Af Amer: >60 mL/min (ref >60)
GFR calc non Af Amer: >60 mL/min (ref >60)
Glucose, Bld: 98 mg/dL (ref 65–99)
Potassium: 3.9 mmol/L (ref 3.5–5.1)
SODIUM: 138 mmol/L (ref 135–145)

## 2017-12-23 LAB — CBC
HCT: 39.3 % (ref 35.0–47.0)
Hemoglobin: 13.2 g/dL (ref 12.0–16.0)
MCH: 32.1 pg (ref 26.0–34.0)
MCHC: 33.6 g/dL (ref 32.0–36.0)
MCV: 95.5 fL (ref 80.0–100.0)
PLATELETS: 247 10*3/uL (ref 150–440)
RBC: 4.12 MIL/uL (ref 3.80–5.20)
RDW: 13.2 % (ref 11.5–14.5)
WBC: 8.1 10*3/uL (ref 3.6–11.0)

## 2017-12-23 LAB — TROPONIN I

## 2017-12-23 NOTE — ED Triage Notes (Signed)
Pt arrived to the ED for complaints of chest pain all day. Pt reports that she took some tylenol for the chest pain and it "helped her go though the whole working day" but the chest pain still there and it " has a gurgling component to it."   Pt states that she was diagnosed with A-fib a few weeks ago; says she got placed on a new heart medication and she thinks that it might be causing some of the symptoms. Pt reports that she is currently  feeling "jittery"; "fluttering" in her chest. Pt is AOx4 in no apparent distress, anxious during triage.

## 2017-12-23 NOTE — Discharge Instructions (Signed)
You have been seen in the emergency department today for chest pain. Your workup has shown normal results. As we discussed please follow-up with your primary care physician in the next 1-2 days for recheck. Return to the emergency department for any further chest pain, trouble breathing, or any other symptom personally concerning to yourself. °

## 2017-12-23 NOTE — ED Provider Notes (Signed)
[facility] Emergency Department Provider Note  Time seen: 9:50 PM  I have reviewed the triage vital signs and the nursing notes.   HISTORY  Chief Complaint Chest Pain    HPI Lori Oliver is a 47 y.o. female with a past medical history of paroxysmal atrial fibrillation, hypertension, presents to the emergency department for chest pain.  According to the patient for the past 2 days he has been experiencing intermittent chest pain which she describes as a gurgling sensation in her upper abdomen into her chest.  Patient states approximately 3 weeks ago she was diagnosed with atrial fibrillation had be admitted to the ICU ultimately she reverted back to normal rhythm after approximately 12 hours.  Patient states since this occurred she has been much more concerned about chest pain or gargling thinking that it could be something wrong with her heart.  Patient is admittedly very anxious about this but states she was experiencing chest discomfort earlier today while on her computer so she came to the emergency department for evaluation.  Patient denies any current shortness of breath, states she will continue to occasionally get a gurgling sensation in her chest.  Denies any nausea at this time.  Denies leg pain or swelling.  Patient states while in the hospital last time for her atrial fibrillation she went back in her normal sinus rhythm and has not been in atrial fibrillation since.  Patient is currently taking blood pressure medication as well as anticoagulation.  Patient follows up with Dr. Yancey Flemings.  Patient states prior to her atrial fibrillation episode she was taking pseudoephedrine for an upper respiratory infection.  Which she has been told could likely be the cause of her A. fib.   Past Medical History:  Diagnosis Date  . Abnormal thyroid function test 05/16/2016  . Atrial fibrillation ([place])   . 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 Decreased libido 05/16/2016  .  Hypertension   . IFG (impaired fasting glucose) 05/16/2016  . Morbid obesity ([place]) 06/01/2015    Patient Active Problem List   Diagnosis Date Noted  . Atrial fibrillation with RVR ([place]) 12/01/2017  . Vitamin D deficiency 07/08/2017  . Lower back pain 07/07/2017  . Screen for colon cancer 07/07/2017  . Morbid obesity ([place]) 07/07/2017  . Hypertension 07/11/2016  . Preventative health care 05/16/2016  . IFG (impaired fasting glucose) 05/16/2016  . 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 Abnormal thyroid function test 05/16/2016  . Decreased libido 05/16/2016  . Breast cancer screening 05/16/2016  . Breast lump on left side at 5 o'clock position 05/16/2016  . Contraception management 05/16/2016  . Surveillance for birth control, oral contraceptives 02/12/2016  . Bilateral thoracic back pain 06/01/2015  . Blood pressure elevated without history of HTN 06/01/2015  . History of breast lump 06/01/2015  . History of abnormal cervical Pap smear 06/01/2015    Past Surgical History:  Procedure Laterality Date  . BREAST BIOPSY Right 2015   benign  . HAMMER TOE SURGERY      Prior to Admission medications   Medication Sig Start Date End Date Taking? Authorizing Provider  apixaban (ELIQUIS) 5 MG TABS tablet Take 1 tablet (5 mg total) by mouth 2 (two) times daily. 12/02/17   Demetrios Loll, MD  sotalol (BETAPACE) 80 MG tablet Take 1 tablet (80 mg total) by mouth every 12 (twelve) hours. 12/02/17   Demetrios Loll, MD    No Known Allergies  Family History  Problem Relation Age of  Onset  . Hypertension Mother   . Heart disease Mother   . Arthritis Mother   . Depression Mother   . Stroke Maternal Grandmother     Social History Social History   Tobacco Use  . Smoking status: Never Smoker  . Smokeless tobacco: Never Used  Substance Use Topics  . Alcohol use: Yes    Alcohol/week: 0.0 oz    Comment: 1 beer today  . Drug use: No    Review of Systems Constitutional: Negative for  fever. Eyes: Negative for visual changes. ENT: Negative for congestion Cardiovascular: Intermittent chest discomfort over the past 2 days. Respiratory: Negative for shortness of breath. Gastrointestinal: Negative for abdominal pain, vomiting Genitourinary: No urinary complaints Musculoskeletal: Negative for back pain. Skin: Negative for rash. Neurological: Negative for headache All other ROS negative  ____________________________________________   PHYSICAL EXAM:  VITAL SIGNS: ED Triage Vitals  Enc Vitals Group     BP 12/23/17 1950 (!) 179/97     Pulse Rate 12/23/17 1950 65     Resp 12/23/17 1950 16     Temp 12/23/17 1950 98.8 F (37.1 C)     Temp Source 12/23/17 1950 Oral     SpO2 --      Weight 12/23/17 1953 256 lb (116.1 kg)     Height 12/23/17 1953 5\' 6"  (1.676 m)     Head Circumference --      Peak Flow --      Pain Score --      Pain Loc --      Pain Edu? --      Excl. in [place]? --    Constitutional: Alert and oriented. Well appearing and in no distress. Eyes: Normal exam ENT   Head: Normocephalic and atraumatic.   Nose: No congestion/rhinnorhea.   Mouth/Throat: Mucous membranes are moist. Cardiovascular: Normal rate, regular rhythm. No murmurs, rubs, or gallops. Respiratory: Normal respiratory effort without tachypnea nor retractions. Breath sounds are clear and equal bilaterally. No wheezes/rales/rhonchi. Gastrointestinal: Soft and nontender. No distention. Musculoskeletal: Nontender with normal range of motion in all extremities. No lower extremity tenderness or edema. Neurologic:  Normal speech and language. No gross focal neurologic deficits are appreciated. Skin:  Skin is warm, dry and intact.  Psychiatric: Mood and affect are normal. Speech and behavior are normal.   ____________________________________________    EKG  EKG reviewed and interpreted by myself shows normal sinus rhythm at 63 bpm, narrow QRS, normal axis, normal intervals, no ST  changes.  Normal EKG.  ____________________________________________    RADIOLOGY  Chest x-ray normal  ____________________________________________   INITIAL IMPRESSION / ASSESSMENT AND PLAN / ED COURSE  Pertinent labs & imaging results that were available during my care of the patient were reviewed by me and considered in my medical decision making (see chart for details).  Patient presents to the emergency department for intermittent chest pain over the past 2 days.  Differential would include ACS, chest wall pain, gastric reflux, anxiety.  Overall the patient appears extremely well with a normal physical exam.  Normal labs including cardiac enzymes.  Normal chest x-ray.  Normal EKG.  Patient is admittedly anxious about her symptoms since being diagnosed with atrial fibrillation several weeks ago.  Patient is in normal sinus rhythm today and states she has been ever since being discharged from the hospital.  I discussed with the patient follow-up with her cardiologist but also try to reassure her given her very reassuring workup and exam currently.  ____________________________________________  FINAL CLINICAL IMPRESSION(S) / ED DIAGNOSES  Chest pain    Harvest Dark, MD 12/23/17 2154

## 2017-12-31 ENCOUNTER — Encounter: Payer: Managed Care, Other (non HMO) | Admitting: Family Medicine

## 2018-01-02 ENCOUNTER — Emergency Department
Admission: EM | Admit: 2018-01-02 | Discharge: 2018-01-02 | Disposition: A | Discharge status: Home / Self Care | Payer: Managed Care, Other (non HMO) | Attending: Emergency Medicine | Admitting: Emergency Medicine

## 2018-01-02 ENCOUNTER — Other Ambulatory Visit: Payer: Self-pay

## 2018-01-02 DIAGNOSIS — R110 Nausea: Secondary | ICD-10-CM

## 2018-01-02 DIAGNOSIS — Z79899 Other long term (current) drug therapy: Secondary | ICD-10-CM | POA: Diagnosis not present

## 2018-01-02 DIAGNOSIS — Z7901 Long term (current) use of anticoagulants: Secondary | ICD-10-CM | POA: Diagnosis not present

## 2018-01-02 DIAGNOSIS — I10 Essential (primary) hypertension: Secondary | ICD-10-CM | POA: Insufficient documentation

## 2018-01-02 LAB — COMPREHENSIVE METABOLIC PANEL
ALK PHOS: 60 U/L (ref 38–126)
ALT: 12 U/L — ABNORMAL LOW (ref 14–54)
AST: 18 U/L (ref 15–41)
Albumin: 3.5 g/dL (ref 3.5–5.0)
Anion gap: 8 (ref 5–15)
BILIRUBIN TOTAL: 0.5 mg/dL (ref 0.3–1.2)
BUN: 12 mg/dL (ref 6–20)
CALCIUM: 8.5 mg/dL — AB (ref 8.9–10.3)
CO2: 21 mmol/L — ABNORMAL LOW (ref 22–32)
Chloride: 107 mmol/L (ref 101–111)
Creatinine, Ser: 0.72 mg/dL (ref 0.44–1.00)
Glucose, Bld: 105 mg/dL — ABNORMAL HIGH (ref 65–99)
Potassium: 3.8 mmol/L (ref 3.5–5.1)
Sodium: 136 mmol/L (ref 135–145)
TOTAL PROTEIN: 6.9 g/dL (ref 6.5–8.1)

## 2018-01-02 LAB — CBC WITH DIFFERENTIAL/PLATELET
BASOS PCT: 1 %
Basophils Absolute: 0.1 10*3/uL (ref 0–0.1)
EOS ABS: 0.2 10*3/uL (ref 0–0.7)
EOS PCT: 3 %
HCT: 35.3 % (ref 35.0–47.0)
HEMOGLOBIN: 12.3 g/dL (ref 12.0–16.0)
Lymphocytes Relative: 38 %
Lymphs Abs: 2.9 10*3/uL (ref 1.0–3.6)
MCH: 32.9 pg (ref 26.0–34.0)
MCHC: 34.9 g/dL (ref 32.0–36.0)
MCV: 94.3 fL (ref 80.0–100.0)
Monocytes Absolute: 0.6 10*3/uL (ref 0.2–0.9)
Monocytes Relative: 8 %
NEUTROS PCT: 50 %
Neutro Abs: 4 10*3/uL (ref 1.4–6.5)
PLATELETS: 243 10*3/uL (ref 150–440)
RBC: 3.74 MIL/uL — AB (ref 3.80–5.20)
RDW: 13.3 % (ref 11.5–14.5)
WBC: 7.8 10*3/uL (ref 3.6–11.0)

## 2018-01-02 LAB — URINALYSIS, COMPLETE (UACMP) WITH MICROSCOPIC
BILIRUBIN URINE: NEGATIVE
Bacteria, UA: NONE SEEN
GLUCOSE, UA: NEGATIVE mg/dL
HGB URINE DIPSTICK: NEGATIVE
Ketones, ur: NEGATIVE mg/dL
Leukocytes, UA: NEGATIVE
NITRITE: NEGATIVE
Protein, ur: NEGATIVE mg/dL
Specific Gravity, Urine: 1.01 (ref 1.005–1.030)
pH: 6 (ref 5.0–8.0)

## 2018-01-02 LAB — HCG, QUANTITATIVE, PREGNANCY

## 2018-01-02 LAB — LIPASE, BLOOD: Lipase: 31 U/L (ref 11–51)

## 2018-01-02 MED ORDER — SODIUM CHLORIDE 0.9 % IV BOLUS (SEPSIS)
1000.0000 mL | Freq: Once | INTRAVENOUS | Status: AC
  Administered 2018-01-02: 1000 mL via INTRAVENOUS

## 2018-01-02 MED ORDER — ONDANSETRON 4 MG PO TBDP: 4.0000 mg | ORAL_TABLET | Freq: Three times a day (TID) | ORAL | 0 refills | Status: DC | PRN

## 2018-01-02 MED ORDER — ONDANSETRON HCL 4 MG/2ML IJ SOLN
4.0000 mg | Freq: Once | INTRAMUSCULAR | Status: AC
  Administered 2018-01-02: 4 mg via INTRAVENOUS
  Filled 2018-01-02: qty 2

## 2018-01-02 NOTE — ED Triage Notes (Signed)
Pt  In with co nausea since today no vomiting or diarrhea. Denies any abd pain, states feels jittery.

## 2018-01-02 NOTE — ED Notes (Signed)
Reviewed discharge instructions, follow-up care, and prescriptions with patient. Patient verbalized understanding of all information reviewed. Patient stable, with no distress noted at this time.    

## 2018-01-02 NOTE — Discharge Instructions (Signed)
Fortunately today your blood work and your urinalysis were reassuring.  I do not have a clear reason why you are so nauseated.  Please keep your appointment with your cardiologist later today as scheduled and return to the emergency department for any concerns such as if you cannot eat or drink, fevers, chills, abdominal pain, or for any other issues whatsoever.  It was a pleasure to take care of you today, and thank you for coming to our emergency department.  If you have any questions or concerns before leaving please ask the nurse to grab me and I'm more than happy to go through your aftercare instructions again.  If you were prescribed any opioid pain medication today such as Norco, Vicodin, Percocet, morphine, hydrocodone, or oxycodone please make sure you do not drive when you are taking this medication as it can alter your ability to drive safely.  If you have any concerns once you are home that you are not improving or are in fact getting worse before you can make it to your follow-up appointment, please do not hesitate to call 911 and come back for further evaluation.  Darel Hong, MD  Results for orders placed or performed during the hospital encounter of 01/02/18  Comprehensive metabolic panel  Result Value Ref Range   Sodium 136 135 - 145 mmol/L   Potassium 3.8 3.5 - 5.1 mmol/L   Chloride 107 101 - 111 mmol/L   CO2 21 (L) 22 - 32 mmol/L   Glucose, Bld 105 (H) 65 - 99 mg/dL   BUN 12 6 - 20 mg/dL   Creatinine, Ser 0.72 0.44 - 1.00 mg/dL   Calcium 8.5 (L) 8.9 - 10.3 mg/dL   Total Protein 6.9 6.5 - 8.1 g/dL   Albumin 3.5 3.5 - 5.0 g/dL   AST 18 15 - 41 U/L   ALT 12 (L) 14 - 54 U/L   Alkaline Phosphatase 60 38 - 126 U/L   Total Bilirubin 0.5 0.3 - 1.2 mg/dL   GFR calc non Af Amer >60 >60 mL/min   GFR calc Af Amer >60 >60 mL/min   Anion gap 8 5 - 15  Lipase, blood  Result Value Ref Range   Lipase 31 11 - 51 U/L  CBC with Differential  Result Value Ref Range   WBC 7.8 3.6 -  11.0 K/uL   RBC 3.74 (L) 3.80 - 5.20 MIL/uL   Hemoglobin 12.3 12.0 - 16.0 g/dL   HCT 35.3 35.0 - 47.0 %   MCV 94.3 80.0 - 100.0 fL   MCH 32.9 26.0 - 34.0 pg   MCHC 34.9 32.0 - 36.0 g/dL   RDW 13.3 11.5 - 14.5 %   Platelets 243 150 - 440 K/uL   Neutrophils Relative % 50 %   Neutro Abs 4.0 1.4 - 6.5 K/uL   Lymphocytes Relative 38 %   Lymphs Abs 2.9 1.0 - 3.6 K/uL   Monocytes Relative 8 %   Monocytes Absolute 0.6 0.2 - 0.9 K/uL   Eosinophils Relative 3 %   Eosinophils Absolute 0.2 0 - 0.7 K/uL   Basophils Relative 1 %   Basophils Absolute 0.1 0 - 0.1 K/uL  Urinalysis, Complete w Microscopic  Result Value Ref Range   Color, Urine STRAW (A) YELLOW   APPearance CLEAR (A) CLEAR   Specific Gravity, Urine 1.010 1.005 - 1.030   pH 6.0 5.0 - 8.0   Glucose, UA NEGATIVE NEGATIVE mg/dL   Hgb urine dipstick NEGATIVE NEGATIVE   Bilirubin  Urine NEGATIVE NEGATIVE   Ketones, ur NEGATIVE NEGATIVE mg/dL   Protein, ur NEGATIVE NEGATIVE mg/dL   Nitrite NEGATIVE NEGATIVE   Leukocytes, UA NEGATIVE NEGATIVE   RBC / HPF 0-5 0 - 5 RBC/hpf   WBC, UA 0-5 0 - 5 WBC/hpf   Bacteria, UA NONE SEEN NONE SEEN   Squamous Epithelial / LPF 0-5 (A) NONE SEEN  hCG, quantitative, pregnancy  Result Value Ref Range   hCG, Beta Chain, Quant, S <1 <5 mIU/mL   Dg Chest 2 View  Result Date: 12/23/2017 CLINICAL DATA:  Chest pain all day EXAM: CHEST  2 VIEW COMPARISON:  12/21/2017 CXR and 12/06/2017 FINDINGS: The heart size and mediastinal contours are within normal limits. Both lungs are clear apart from minimal subsegmental atelectasis at the left lung base, stable in appearance since recent comparison. The visualized skeletal structures are unremarkable. IMPRESSION: No active cardiopulmonary disease. Electronically Signed   By: Ashley Royalty M.D.   On: 12/23/2017 20:13   Dg Chest 2 View  Result Date: 12/21/2017 CLINICAL DATA:  Shortness of breath today. EXAM: CHEST  2 VIEW COMPARISON:  Radiograph 12/06/2017.  CT  02/01/2017 FINDINGS: The cardiomediastinal contours are normal. Subsegmental atelectasis at the left lung base. Pulmonary vasculature is normal. No consolidation, pleural effusion, or pneumothorax. No acute osseous abnormalities are seen. IMPRESSION: Minimal left basilar atelectasis.  Otherwise no acute abnormality. Electronically Signed   By: Jeb Levering M.D.   On: 12/21/2017 04:59   Dg Chest Portable 1 View  Result Date: 12/06/2017 CLINICAL DATA:  [place] in chest.  Feeling anxious. EXAM: PORTABLE CHEST 1 VIEW COMPARISON:  12/01/2017 FINDINGS: The heart size and mediastinal contours are within normal limits. Both lungs are clear. The visualized skeletal structures are unremarkable. IMPRESSION: No active disease. Electronically Signed   By: Kathreen Devoid   On: 12/06/2017 08:54

## 2018-01-02 NOTE — ED Provider Notes (Signed)
[facility] Emergency Department Provider Note  ____________________________________________   First MD Initiated Contact with Patient 01/02/18 0031     (approximate)  I have reviewed the triage vital signs and the nursing notes.   HISTORY  Chief Complaint Nausea    HPI Lori Oliver is a 47 y.o. female who self presents emergency department with 1 day of moderate severity constant nausea.  She has had no pain whatsoever.  She has not actually vomited.  She feels like her symptoms initially began about a month ago when she began medication for atrial fibrillation with rapid ventricular response.  She says she does not like the way the medication makes her feel.  She does have follow-up with her cardiologist later on this afternoon.  Her nausea had insidious onset is currently moderate in severity.  It is worsened when trying to eat and somewhat improved with rest.  Past Medical History:  Diagnosis Date  . Abnormal thyroid function test 05/16/2016  . Atrial fibrillation ([place])   . 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 Decreased libido 05/16/2016  . Hypertension   . IFG (impaired fasting glucose) 05/16/2016  . Morbid obesity ([place]) 06/01/2015    Patient Active Problem List   Diagnosis Date Noted  . Atrial fibrillation with RVR ([place]) 12/01/2017  . Vitamin D deficiency 07/08/2017  . Lower back pain 07/07/2017  . Screen for colon cancer 07/07/2017  . Morbid obesity ([place]) 07/07/2017  . Hypertension 07/11/2016  . Preventative health care 05/16/2016  . IFG (impaired fasting glucose) 05/16/2016  . 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 Abnormal thyroid function test 05/16/2016  . Decreased libido 05/16/2016  . Breast cancer screening 05/16/2016  . Breast lump on left side at 5 o'clock position 05/16/2016  . Contraception management 05/16/2016  . Surveillance for birth control, oral contraceptives 02/12/2016  . Bilateral thoracic back pain 06/01/2015    . Blood pressure elevated without history of HTN 06/01/2015  . History of breast lump 06/01/2015  . History of abnormal cervical Pap smear 06/01/2015    Past Surgical History:  Procedure Laterality Date  . BREAST BIOPSY Right 2015   benign  . HAMMER TOE SURGERY      Prior to Admission medications   Medication Sig Start Date End Date Taking? Authorizing Provider  apixaban (ELIQUIS) 5 MG TABS tablet Take 1 tablet (5 mg total) by mouth 2 (two) times daily. 12/02/17   Demetrios Loll, MD  ondansetron (ZOFRAN ODT) 4 MG disintegrating tablet Take 1 tablet (4 mg total) by mouth every 8 (eight) hours as needed for nausea or vomiting. 01/02/18   Darel Hong, MD  sotalol (BETAPACE) 80 MG tablet Take 1 tablet (80 mg total) by mouth every 12 (twelve) hours. 12/02/17   Demetrios Loll, MD    Allergies Patient has no known allergies.  Family History  Problem Relation Age of Onset  . Hypertension Mother   . Heart disease Mother   . Arthritis Mother   . Depression Mother   . Stroke Maternal Grandmother     Social History Social History   Tobacco Use  . Smoking status: Never Smoker  . Smokeless tobacco: Never Used  Substance Use Topics  . Alcohol use: Yes    Alcohol/week: 0.0 oz    Comment: 1 beer today  . Drug use: No    Review of Systems Constitutional: No fever/chills Eyes: No visual changes. ENT: No sore throat. Cardiovascular: Denies chest pain. Respiratory: Denies shortness of breath. Gastrointestinal:  No abdominal pain.  Positive for nausea, no vomiting.  No diarrhea.  No constipation. Genitourinary: Negative for dysuria. Musculoskeletal: Negative for back pain. Skin: Negative for rash. Neurological: Negative for headaches, focal weakness or numbness.   ____________________________________________   PHYSICAL EXAM:  VITAL SIGNS: ED Triage Vitals  Enc Vitals Group     BP 01/02/18 0011 (!) 153/76     Pulse Rate 01/02/18 0011 68     Resp 01/02/18 0011 18     Temp  01/02/18 0011 98.4 F (36.9 C)     Temp Source 01/02/18 0011 Oral     SpO2 01/02/18 0011 97 %     Weight 01/02/18 0008 250 lb (113.4 kg)     Height 01/02/18 0008 5\' 6"  (1.676 m)     Head Circumference --      Peak Flow --      Pain Score 01/02/18 0008 7     Pain Loc --      Pain Edu? --      Excl. in [place]? --     Constitutional: Alert and oriented x4 pleasant cooperative Eyes: PERRL EOMI. Head: Atraumatic. Nose: No congestion/rhinnorhea. Mouth/Throat: No trismus Neck: No stridor.   Cardiovascular: Normal rate, regular rhythm. Grossly normal heart sounds.  Good peripheral circulation. Respiratory: Normal respiratory effort.  No retractions. Lungs CTAB and moving good air Gastrointestinal: Soft nondistended nontender no rebound or guarding no peritonitis no McBurney's tenderness and Rovsing's no costovertebral tenderness negative Murphy's Musculoskeletal: No lower extremity edema   Neurologic:  Normal speech and language. No gross focal neurologic deficits are appreciated. Skin:  Skin is warm, dry and intact. No rash noted. Psychiatric: Mood and affect are normal. Speech and behavior are normal.    ____________________________________________   DIFFERENTIAL includes but not limited to  Dehydration, gastritis, gastric reflux, urinary tract infection, medication reaction, cholecystitis, biliary colic ____________________________________________   LABS (all labs ordered are listed, but only abnormal results are displayed)  Labs Reviewed  COMPREHENSIVE METABOLIC PANEL - Abnormal; Notable for the following components:      Result Value   CO2 21 (*)    Glucose, Bld 105 (*)    Calcium 8.5 (*)    ALT 12 (*)    All other components within normal limits  CBC WITH DIFFERENTIAL/PLATELET - Abnormal; Notable for the following components:   RBC 3.74 (*)    All other components within normal limits  URINALYSIS, COMPLETE (UACMP) WITH MICROSCOPIC - Abnormal; Notable for the following  components:   Color, Urine STRAW (*)    APPearance CLEAR (*)    Squamous Epithelial / LPF 0-5 (*)    All other components within normal limits  LIPASE, BLOOD  HCG, QUANTITATIVE, PREGNANCY    Lab work reviewed by me with no acute disease __________________________________________  EKG   ____________________________________________  RADIOLOGY   ____________________________________________   PROCEDURES  Procedure(s) performed: no  Procedures  Critical Care performed: no  Observation: no ____________________________________________   INITIAL IMPRESSION / ASSESSMENT AND PLAN / ED COURSE  Pertinent labs & imaging results that were available during my care of the patient were reviewed by me and considered in my medical decision making (see chart for details).  The patient arrives somewhat uncomfortable appearing although with no pain whatsoever and an unremarkable exam.  Unclear etiology of her nausea but lab work, Zofran, urinalysis are pending.  After fluids and Zofran the patient's symptoms are resolved.  I had a lengthy discussion with the patient regarding her nausea and the  diagnostic uncertainty.  I do not believe she warrants advanced imaging at this time.  She is comfortable going home and she has follow-up with her cardiologist later on this afternoon.  She is discharged home in improved condition verbalized understanding and agree with the plan.      ____________________________________________   FINAL CLINICAL IMPRESSION(S) / ED DIAGNOSES  Final diagnoses:  Nausea      NEW MEDICATIONS STARTED DURING THIS VISIT:  Discharge Medication List as of 01/02/2018  3:10 AM    START taking these medications   Details  ondansetron (ZOFRAN ODT) 4 MG disintegrating tablet Take 1 tablet (4 mg total) by mouth every 8 (eight) hours as needed for nausea or vomiting., Starting Fri 01/02/2018, Print         Note:  This document was prepared using Dragon voice  recognition software and may include unintentional dictation errors.     Darel Hong, MD 01/02/18 [phone]

## 2018-01-06 ENCOUNTER — Telehealth: Payer: Self-pay | Admitting: Family Medicine

## 2018-01-06 NOTE — Telephone Encounter (Signed)
Called pt back she states she went to the cardiologist and the stress test turned out fine.  She told them that the medicine eliquis and sotalol made her feel bad and does not want to take.  But doctor told her to keep taking.  She wanted to get your opinion, nothing seems to be wrong so why should she continue this medicine that makes her feel bad?  She states she gets jittery and just feels bad!  Please advise? Also she states canceled the cardio 2nd opinion since the stress test turned out fine.

## 2018-01-06 NOTE — Telephone Encounter (Signed)
Copied from [place]. Topic: Quick Communication - See Telephone Encounter >> Jan 06, 2018  8:54 AM Antonieta Iba C wrote: CRM for notification. See Telephone encounter for: pt says that she would like a call back from office to discuss medications  CB: [phone]  01/06/18.

## 2018-01-07 ENCOUNTER — Telehealth: Payer: Self-pay

## 2018-01-07 NOTE — Telephone Encounter (Signed)
Copied from [place]. Topic: Quick Communication - See Telephone Encounter >> Jan 06, 2018  8:54 AM Antonieta Iba C wrote: CRM for notification. See Telephone encounter for: pt says that she would like a call back from office to discuss medications  CB: [phone]  01/06/18. >> Jan 07, 2018 12:56 PM Patrice Paradise wrote: Patient is requesting a call back from Dr. Sanda Klein or Roselyn Reef. She still have some questions about her meds.    Dr.Lada it looks like Roselyn Reef routed this to you yesterday. Sending it again, but it looks to be still open and unresolved. Thanks!

## 2018-01-07 NOTE — Telephone Encounter (Signed)
I called her number, left detailed message, want to hear about her cardio visit and discuss her meds I'll try her tomorrow

## 2018-01-08 NOTE — Telephone Encounter (Signed)
Two phone notes going on this; other one is closed now CHA2DS2-VASc score is 2 2 points Stroke risk was 2.2% per year in >90,000 patients (the [place] Atrial Fibrillation Cohort Study) and 2.9% risk of stroke/TIA/systemic embolism.  One recommendation suggests a 0 score is "low" risk and may not require anticoagulation; a 1 score is "low-moderate" risk and should consider antiplatelet or anticoagulation, and score 2 or greater is "moderate-high" risk and should otherwise be an anticoagulation candidate. -------------------------------------------- I spoke with patient, apologized for it being so late She says that she met with cardiologist; stress test negative; the sleep study got rescheduled, as she is moving this weekend, in town The sotalol or blood thinner is making her feel nauseated They told her she is one percent at having a stroke I totally understand where she is coming from She is stressed taking the medicine; she has no hx of atrial fibrillation; she feels tired, just doesn't feel right I'll be happy to contact her heart doctor She doesn't want to just quit taking it Discussed the CHA2DS2-VASc scores, with and without HTN history I'll call Dr. Khan and see what he thinks about risk of bleed vs risk of stroke, aspirin, stopping betapace, etc. 

## 2018-01-09 ENCOUNTER — Ambulatory Visit: Payer: Managed Care, Other (non HMO) | Admitting: Family Medicine

## 2018-01-09 ENCOUNTER — Telehealth: Payer: Self-pay | Admitting: Family Medicine

## 2018-01-09 NOTE — Telephone Encounter (Signed)
Provider from patient's cardiology office called We talked about the fact that she feels badly, thinks it is one of her medicines Provider guesses that it's likely the sotalol She should NOT stop that cold [place], recommends that patient call cardiologist for info on how to stop that Provider talked with patient about waiting until the results of the sleep study come back before stopping the blood thinner; she hopes that patient will continue blood thinner until they know she doesn't have sleep apnea She understands patient not excited about taking medicine for the rest of her life if not needed, if risks are worse than benefits ------------------------------------------ [facility] staff: Please let patient know that I talked to the cardiology provider She asks that the patient contact her office about how to come off of the sotalol; do NOT stop it abruptly She hopes that the patient will at least wait until the sleep study is resulted before they decide about the blood thinner; she wants her to continue the blood thinner for now, but understands that patient is hoping to stop

## 2018-01-09 NOTE — Telephone Encounter (Signed)
Called the office of Dr.Khan, LM for him to give Dr.Lada a call back on her cell #.

## 2018-01-09 NOTE — Telephone Encounter (Signed)
Please contact Dr. Laurelyn Sickle office and ask if he has a moment to talk with me about patient and her sotalol and anticoagulant Thank you

## 2018-01-12 ENCOUNTER — Ambulatory Visit: Payer: Managed Care, Other (non HMO) | Admitting: Family Medicine

## 2018-01-12 NOTE — Telephone Encounter (Signed)
Called pt informed her of the information below per Dr.Lada. Pt gave verbal understanding, states she will continue to take medication.

## 2018-01-13 ENCOUNTER — Encounter: Payer: Self-pay | Admitting: Family Medicine

## 2018-01-13 ENCOUNTER — Ambulatory Visit (INDEPENDENT_AMBULATORY_CARE_PROVIDER_SITE_OTHER): Payer: Managed Care, Other (non HMO) | Admitting: Family Medicine

## 2018-01-13 VITALS — BP 140/80 | HR 65 | Temp 98.3°F | Resp 14 | Wt 254.6 lb

## 2018-01-13 DIAGNOSIS — M25471 Effusion, right ankle: Secondary | ICD-10-CM | POA: Diagnosis not present

## 2018-01-13 DIAGNOSIS — F419 Anxiety disorder, unspecified: Secondary | ICD-10-CM | POA: Insufficient documentation

## 2018-01-13 DIAGNOSIS — F418 Other specified anxiety disorders: Secondary | ICD-10-CM | POA: Diagnosis not present

## 2018-01-13 DIAGNOSIS — I4891 Unspecified atrial fibrillation: Secondary | ICD-10-CM

## 2018-01-13 NOTE — Progress Notes (Signed)
BP 140/80   Pulse 65   Temp 98.3 F (36.8 C) (Oral)   Resp 14   Wt 254 lb 9.6 oz (115.5 kg)   SpO2 97%   BMI 41.09 kg/m    Subjective:    Patient ID: Lori Oliver, female    DOB: 13-Nov-1971, 47 y.o.   MRN: [id number]  HPI: Lori Oliver is a 47 y.o. female  Chief Complaint  Patient presents with  . Foot Pain    all the time, more with walking, with swelling    HPI Patient is here for an acute visit for a new problem  Her foot started hurting on Friday; was bad enough to leave work; hurting getting out of bed; no injury; no pain on Thursday; no pain in the heel, no pain in the Achilles, no pain along the plantar aspect; only hurts along the lateral aspect below the lateral malleolus; does not recall hurting herself; moved over the weekend and had been packing, but the ankle started to hurt before the big move day so she doesn't think it's related; she has had edema in her legs; has been anxious about her health overall and just wants to see if related to her medicine at all or a blood clot   We discussed another problem as well, her recent bout of afib with RVR and the meds she takes  She thinks the medicine is making her feel bad; she has been on blood thinner and sotalol for one episode of atrial fibrillation; she has been very anxious about her health overall since that occurrence; she does not think anything is wrong with her heart; thinks it was the cold medicine she took and says she probably took too much of it; coworkers can tell she isn't herself; not as talkative; energy level is really low; really anxious about having to be on blood thinners; cardiologist scheduled a sleep study for later in February; she will wait until after the sleep study before stopping the blood thinner; no one in the family has OSA; I asked if she could stop the medicine, would the anxiety be gone; she says it's the medicine and doesn't think there is other stuff going on; feels sad over the  situation but no SI  Depression screen [facility] 2/9 01/13/2018 01/13/2018 12/15/2017 07/07/2017 01/29/2017  Decreased Interest 0 1 0 0 0  Down, Depressed, Hopeless 1 1 1 1  0  PHQ - 2 Score 1 2 1 1  0  Altered sleeping 0 - - - -  Tired, decreased energy 1 - - - -  Change in appetite 1 - - - -  Feeling bad or failure about yourself  0 - - - -  Trouble concentrating 0 - - - -  Moving slowly or fidgety/restless 0 - - - -  Suicidal thoughts 0 - - - -  PHQ-9 Score 3 - - - -  Difficult doing work/chores Somewhat difficult - - - -   Relevant past medical, surgical, family and social history reviewed Past Medical History:  Diagnosis Date  . Abnormal thyroid function test 05/16/2016  . Anxiety about health 01/13/2018  . Atrial fibrillation ([place])   . 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 Decreased libido 05/16/2016  . Hypertension   . IFG (impaired fasting glucose) 05/16/2016  . Morbid obesity ([place]) 06/01/2015   Past Surgical History:  Procedure Laterality Date  . BREAST BIOPSY Right 2015   benign  . HAMMER TOE SURGERY  Family History  Problem Relation Age of Onset  . Hypertension Mother   . Heart disease Mother   . Arthritis Mother   . Depression Mother   . Stroke Maternal Grandmother    Social History   Tobacco Use  . Smoking status: Never Smoker  . Smokeless tobacco: Never Used  Substance Use Topics  . Alcohol use: Yes    Alcohol/week: 0.0 oz    Comment: 1 beer today  . Drug use: No    Interim medical history since last visit reviewed. Allergies and medications reviewed  Review of Systems Per HPI unless specifically indicated above     Objective:    BP 140/80   Pulse 65   Temp 98.3 F (36.8 C) (Oral)   Resp 14   Wt 254 lb 9.6 oz (115.5 kg)   SpO2 97%   BMI 41.09 kg/m   Wt Readings from Last 3 Encounters:  01/13/18 254 lb 9.6 oz (115.5 kg)  01/02/18 250 lb (113.4 kg)  12/23/17 256 lb (116.1 kg)    Physical Exam  Constitutional: She appears well-developed and  well-nourished. No distress.  Cardiovascular: Normal rate and regular rhythm.  No extrasystoles are present.  Pulses:      Dorsalis pedis pulses are 1+ on the right side, and 1+ on the left side.  Pulmonary/Chest: Effort normal and breath sounds normal.  Musculoskeletal:       Right ankle: She exhibits swelling (swelling (nonpitting) below lateral malleolus). She exhibits normal range of motion, no ecchymosis, no deformity and normal pulse.       Left ankle: She exhibits swelling (small amount of swelling (nonpitting) below lateral malleolus). She exhibits normal range of motion, no ecchymosis, no deformity and normal pulse.       Right lower leg: She exhibits no tenderness, no swelling and no edema.       Left lower leg: She exhibits no tenderness, no swelling and no edema.  No tenderness with palpation over the right plantar fascia or the right heel or the right Achilles tendon; the right calf is not swollen or erythematous or warm; unable to elicit a positive Homan's sign  Neurological:  Sensation to light touch present in all toes on the right foot  Skin: No pallor.  Psychiatric: Her mood appears anxious.  Became briefly tearful when reviewing her health issues, not wanting to go down a path of health problem after health problem      Assessment & Plan:   Problem List Items Addressed This Visit      Cardiovascular and Mediastinum   Atrial fibrillation with RVR ([place])    Episode in December was most likely related to excessive use of cold medicine containing stimulant; she has passed stress test and had echocardiogram; only pending test is the sleep study; I explained that per discussion with cardiology provider, they want her to have the sleep study before she stops her medicines; at the very least, don't stop the blood thinner until then, but she was accepting of the thought of weaning her down off of the sotalol; patient wanted to take just one full pill daily; I explained that it's a  BID medicine, not intended to take just daily, and that could cause rebound problems; defer to cardiologist for tapering, but IF she cannot reach them, I explained safer in my opinion to take half of a pill BID than one pill daily; she called cardiologist and then I called cardiologist about this and we both left messages  asking for them to contact her about tapering the sotalol        Other   Anxiety about health    She believes that if she could stop the pills and find out her tests are all normal, that her anxiety would go away; she does not want anti-anxiety medicine, citing that would just be another pill to take; I am here for her if there is anything I can do       Other Visit Diagnoses    Ankle swelling, right    -  Primary   more than left; nothing to suggest DVT on exam; suspect related to med; she'll talk w/cards about tapering off; rest, ice, elevation       Follow up plan: No Follow-up on file.  An after-visit summary was printed and given to the patient at [place].  Please see the patient instructions which may contain other information and recommendations beyond what is mentioned above in the assessment and plan.

## 2018-01-13 NOTE — Assessment & Plan Note (Signed)
Episode in December was most likely related to excessive use of cold medicine containing stimulant; she has passed stress test and had echocardiogram; only pending test is the sleep study; I explained that per discussion with cardiology provider, they want her to have the sleep study before she stops her medicines; at the very least, don't stop the blood thinner until then, but she was accepting of the thought of weaning her down off of the sotalol; patient wanted to take just one full pill daily; I explained that it's a BID medicine, not intended to take just daily, and that could cause rebound problems; defer to cardiologist for tapering, but IF she cannot reach them, I explained safer in my opinion to take half of a pill BID than one pill daily; she called cardiologist and then I called cardiologist about this and we both left messages asking for them to contact her about tapering the sotalol

## 2018-01-13 NOTE — Patient Instructions (Addendum)
Ask your cardiologist about coming off the sotalol safely If you cannot get in touch with cardiology and really want to reduce the dose, cut the pill in half and take just half of a pill every 12 hours; don't stop abruptly; obviously, follow their instructions Try ice and compression and elevation Let me know how you're doing and if I can help in any way Try to follow the DASH guidelines (DASH stands for Dietary Approaches to Stop Hypertension). Try to limit the sodium in your diet to no more than 1,500mg  of sodium per day. Certainly try to not exceed 2,000 mg per day at the very most. Do not add salt when cooking or at the table.  Check the sodium amount on labels when shopping, and choose items lower in sodium when given a choice. Avoid or limit foods that already contain a lot of sodium. Eat a diet rich in fruits and vegetables and whole grains, and try to lose weight if overweight or obese  12 Ways to Curb Anxiety  ?Anxiety is normal human sensation. It is what helped our ancestors survive the pitfalls of the wilderness. Anxiety is defined as experiencing worry or nervousness about an imminent event or something with an uncertain outcome. It is a feeling experienced by most people at some point in their lives. Anxiety can be triggered by a very personal issue, such as the illness of a loved one, or an event of global proportions, such as a refugee crisis. Some of the symptoms of anxiety are:  Feeling restless.  Having a feeling of impending danger.  Increased heart rate.  Rapid breathing. Sweating.  Shaking.  Weakness or feeling tired.  Difficulty concentrating on anything except the current worry.  Insomnia.  Stomach or bowel problems. What can we do about anxiety we may be feeling? There are many techniques to help manage stress and relax. Here are 12 ways you can reduce your anxiety almost immediately: 1. Turn off the constant feed of information. Take a social media sabbatical. Studies  have shown that social media directly contributes to social anxiety.  2. Monitor your television viewing habits. Are you watching shows that are also contributing to your anxiety, such as 24-hour news stations? Try watching something else, or better yet, nothing at all. Instead, listen to music, read an inspirational book or practice a hobby. 3. Eat nutritious meals. Also, don't skip meals and keep healthful snacks on hand. Hunger and poor diet contributes to feeling anxious. 4. Sleep. Sleeping on a regular schedule for at least seven to eight hours a night will do wonders for your outlook when you are awake. 5. Exercise. Regular exercise will help rid your body of that anxious energy and help you get more restful sleep. 6. Try deep (diaphragmatic) breathing. Inhale slowly through your nose for five seconds and exhale through your mouth. 7. Practice acceptance and gratitude. When anxiety hits, accept that there are things out of your control that shouldn't be of immediate concern.  8. Seek out humor. When anxiety strikes, watch a funny video, read jokes or call a friend who makes you laugh. Laughter is healing for our bodies and releases endorphins that are calming. 9. Stay positive. Take the effort to replace negative thoughts with positive ones. Try to see a stressful situation in a positive light. Try to come up with solutions rather than dwelling on the problem. 10. Figure out what triggers your anxiety. Keep a journal and make note of anxious moments and the events  surrounding them. This will help you identify triggers you can avoid or even eliminate. 11. Talk to someone. Let a trusted friend, family member or even trained professional know that you are feeling overwhelmed and anxious. Verbalize what you are feeling and why.  12. Volunteer. If your anxiety is triggered by a crisis on a large scale, become an advocate and work to resolve the problem that is causing you unease. Anxiety is often  unwelcome and can become overwhelming. If not kept in check, it can become a disorder that could require medical treatment. However, if you take the time to care for yourself and avoid the triggers that make you anxious, you will be able to find moments of relaxation and clarity that make your life much more enjoyable.

## 2018-01-13 NOTE — Assessment & Plan Note (Signed)
She believes that if she could stop the pills and find out her tests are all normal, that her anxiety would go away; she does not want anti-anxiety medicine, citing that would just be another pill to take; I am here for her if there is anything I can do

## 2018-01-14 ENCOUNTER — Ambulatory Visit: Payer: Managed Care, Other (non HMO) | Admitting: Internal Medicine

## 2018-01-23 ENCOUNTER — Other Ambulatory Visit: Payer: Self-pay

## 2018-01-23 ENCOUNTER — Emergency Department
Admission: EM | Admit: 2018-01-23 | Discharge: 2018-01-23 | Disposition: A | Discharge status: Home / Self Care | Payer: Managed Care, Other (non HMO) | Attending: Emergency Medicine | Admitting: Emergency Medicine

## 2018-01-23 ENCOUNTER — Encounter: Payer: Self-pay | Admitting: Emergency Medicine

## 2018-01-23 DIAGNOSIS — Z7901 Long term (current) use of anticoagulants: Secondary | ICD-10-CM | POA: Insufficient documentation

## 2018-01-23 DIAGNOSIS — I10 Essential (primary) hypertension: Secondary | ICD-10-CM | POA: Diagnosis not present

## 2018-01-23 LAB — BASIC METABOLIC PANEL
Anion gap: 7 (ref 5–15)
BUN: 12 mg/dL (ref 6–20)
CO2: 25 mmol/L (ref 22–32)
Calcium: 8.9 mg/dL (ref 8.9–10.3)
Chloride: 105 mmol/L (ref 101–111)
Creatinine, Ser: 0.89 mg/dL (ref 0.44–1.00)
GFR calc Af Amer: >60 mL/min (ref >60)
GFR calc non Af Amer: >60 mL/min (ref >60)
Glucose, Bld: 103 mg/dL — ABNORMAL HIGH (ref 65–99)
Potassium: 3.8 mmol/L (ref 3.5–5.1)
Sodium: 137 mmol/L (ref 135–145)

## 2018-01-23 LAB — CBC
HCT: 37.7 % (ref 35.0–47.0)
Hemoglobin: 13 g/dL (ref 12.0–16.0)
MCH: 32.8 pg (ref 26.0–34.0)
MCHC: 34.5 g/dL (ref 32.0–36.0)
MCV: 94.9 fL (ref 80.0–100.0)
Platelets: 250 10*3/uL (ref 150–440)
RBC: 3.97 MIL/uL (ref 3.80–5.20)
RDW: 13.2 % (ref 11.5–14.5)
WBC: 8.2 10*3/uL (ref 3.6–11.0)

## 2018-01-23 LAB — TROPONIN I

## 2018-01-23 MED ORDER — HYDROCHLOROTHIAZIDE 25 MG PO TABS: 25.0000 mg | ORAL_TABLET | Freq: Every day | ORAL | 0 refills | Status: DC

## 2018-01-23 MED ORDER — HYDROCHLOROTHIAZIDE 25 MG PO TABS
25.0000 mg | ORAL_TABLET | Freq: Once | ORAL | Status: AC
  Administered 2018-01-23: 25 mg via ORAL
  Filled 2018-01-23: qty 1

## 2018-01-23 NOTE — ED Notes (Signed)
Pt asking that we give her something at this time for HTN, this RN informed pt that we do not treat HTN in triage, and informed we will do protocols to rule out any emergency at this time

## 2018-01-23 NOTE — Discharge Instructions (Signed)
We have started you on hypertension medications given your concerns, please discuss with Dr. Sanda Klein whether she feels you need to stay on this medication

## 2018-01-23 NOTE — ED Provider Notes (Signed)
[facility] Emergency Department Provider Note   ____________________________________________    I have reviewed the triage vital signs and the nursing notes.   HISTORY  Chief Complaint Hypertension     HPI Lori Oliver is a 47 y.o. female who presents with complaints of high blood pressure.  Patient reports today at work she was feeling a mild headache and somewhat flushed and so she checked her blood pressure and found it to be high in the 885 systolic.  She reports recently she has been worked up by her PCP and cardiology for an episode of atrial fibrillation which she has not had again.  She has not been put on blood pressure medication.  She denies nausea or vomiting or chest pain no shortness of breath.   Past Medical History:  Diagnosis Date  . Abnormal thyroid function test 05/16/2016  . Anxiety about health 01/13/2018  . Atrial fibrillation ([place])   . 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 Decreased libido 05/16/2016  . Hypertension   . IFG (impaired fasting glucose) 05/16/2016  . Morbid obesity ([place]) 06/01/2015    Patient Active Problem List   Diagnosis Date Noted  . Anxiety about health 01/13/2018  . Atrial fibrillation with RVR ([place]) 12/01/2017  . Vitamin D deficiency 07/08/2017  . Lower back pain 07/07/2017  . Screen for colon cancer 07/07/2017  . Morbid obesity ([place]) 07/07/2017  . Hypertension 07/11/2016  . Preventative health care 05/16/2016  . IFG (impaired fasting glucose) 05/16/2016  . 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 Abnormal thyroid function test 05/16/2016  . Decreased libido 05/16/2016  . Breast cancer screening 05/16/2016  . Breast lump on left side at 5 o'clock position 05/16/2016  . Contraception management 05/16/2016  . Surveillance for birth control, oral contraceptives 02/12/2016  . Bilateral thoracic back pain 06/01/2015  . Blood pressure elevated without history of HTN 06/01/2015  . History of  breast lump 06/01/2015  . History of abnormal cervical Pap smear 06/01/2015    Past Surgical History:  Procedure Laterality Date  . BREAST BIOPSY Right 2015   benign  . HAMMER TOE SURGERY      Prior to Admission medications   Medication Sig Start Date End Date Taking? Authorizing Provider  apixaban (ELIQUIS) 5 MG TABS tablet Take 1 tablet (5 mg total) by mouth 2 (two) times daily. 12/02/17   Demetrios Loll, MD  hydrochlorothiazide (HYDRODIURIL) 25 MG tablet Take 1 tablet (25 mg total) by mouth daily. 01/23/18   Lavonia Drafts, MD  sotalol (BETAPACE) 80 MG tablet Take 1 tablet (80 mg total) by mouth every 12 (twelve) hours. 12/02/17   Demetrios Loll, MD     Allergies Patient has no known allergies.  Family History  Problem Relation Age of Onset  . Hypertension Mother   . Heart disease Mother   . Arthritis Mother   . Depression Mother   . Stroke Maternal Grandmother     Social History Social History   Tobacco Use  . Smoking status: Never Smoker  . Smokeless tobacco: Never Used  Substance Use Topics  . Alcohol use: No    Alcohol/week: 0.0 oz    Frequency: Never  . Drug use: No    Review of Systems  Constitutional: No fever/chills   Cardiovascular: Denies chest pain. Respiratory: Denies shortness of breath. Gastrointestinal:  No nausea, no vomiting.     Neurological: Negative for focal weakness    ____________________________________________   PHYSICAL EXAM:  VITAL SIGNS:  ED Triage Vitals [01/23/18 1946]  Enc Vitals Group     BP (!) 170/94     Pulse Rate 63     Resp 16     Temp 98.9 F (37.2 C)     Temp Source Oral     SpO2 100 %     Weight 113.4 kg (250 lb)     Height 1.676 m (5\' 6" )     Head Circumference      Peak Flow      Pain Score 8     Pain Loc      Pain Edu?      Excl. in [place]?     Constitutional: Alert and oriented. No acute distress. Pleasant and interactive Eyes: Conjunctivae are normal.   Nose: No  congestion/rhinnorhea.  Cardiovascular: Normal rate, regular rhythm. Grossly normal heart sounds.  Good peripheral circulation. Respiratory: Normal respiratory effort.  No retractions. Lungs CTAB. Gastrointestinal: Soft and nontender. No distention.  Genitourinary: deferred Musculoskeletal: No lower extremity tenderness nor edema.  Warm and well perfused Neurologic:  Normal speech and language. No gross focal neurologic deficits are appreciated.  Skin:  Skin is warm, dry and intact. No rash noted. Psychiatric: Mood and affect are normal. Speech and behavior are normal.  ____________________________________________   LABS (all labs ordered are listed, but only abnormal results are displayed)  Labs Reviewed  BASIC METABOLIC PANEL - Abnormal; Notable for the following components:      Result Value   Glucose, Bld 103 (*)    All other components within normal limits  CBC  TROPONIN I  POC URINE PREG, ED   ____________________________________________  EKG  ED ECG REPORT I, Lavonia Drafts, the attending physician, personally viewed and interpreted this ECG.  Date: 01/23/2018  Rhythm: normal sinus rhythm QRS Axis: normal Intervals: normal ST/T Wave abnormalities: normal Narrative Interpretation: no evidence of acute ischemia  ____________________________________________  RADIOLOGY  None ____________________________________________   PROCEDURES  Procedure(s) performed: No  Procedures   Critical Care performed:No ____________________________________________   INITIAL IMPRESSION / ASSESSMENT AND PLAN / ED COURSE  Pertinent labs & imaging results that were available during my care of the patient were reviewed by me and considered in my medical decision making (see chart for details).  Patient well-appearing in no acute distress.  Blood pressure is elevated however lab work is all quite reassuring.  EKG unremarkable.  Exam is reassuring.  Discussed blood pressure with  the patient.  She seems to be quite anxious about it and so we decided to start her on hydrochlorothiazide, I recommended that she discuss with her PCP regarding her hypertensive medication    ____________________________________________   FINAL CLINICAL IMPRESSION(S) / ED DIAGNOSES  Final diagnoses:  Essential hypertension        Note:  This document was prepared using Dragon voice recognition software and may include unintentional dictation errors.    Lavonia Drafts, MD 01/23/18 2250

## 2018-01-23 NOTE — ED Triage Notes (Signed)
Pt to ED via POV with c/o HTN today, pt not currently on any HTN meds. Pt c/o headache. Pt speech clear, A&Ox4

## 2018-01-28 ENCOUNTER — Encounter: Payer: Self-pay | Admitting: Obstetrics and Gynecology

## 2018-01-28 ENCOUNTER — Ambulatory Visit (INDEPENDENT_AMBULATORY_CARE_PROVIDER_SITE_OTHER): Payer: Managed Care, Other (non HMO) | Admitting: Obstetrics and Gynecology

## 2018-01-28 VITALS — BP 146/87 | HR 67 | Ht 67.0 in | Wt 252.8 lb

## 2018-01-28 DIAGNOSIS — Z975 Presence of (intrauterine) contraceptive device: Secondary | ICD-10-CM

## 2018-01-28 DIAGNOSIS — I10 Essential (primary) hypertension: Secondary | ICD-10-CM

## 2018-01-28 DIAGNOSIS — Z978 Presence of other specified devices: Secondary | ICD-10-CM

## 2018-01-28 DIAGNOSIS — N921 Excessive and frequent menstruation with irregular cycle: Secondary | ICD-10-CM | POA: Diagnosis not present

## 2018-01-28 MED ORDER — ESTRADIOL 1 MG PO TABS: 1.0000 mg | ORAL_TABLET | Freq: Every day | ORAL | 1 refills | Status: DC

## 2018-01-28 NOTE — Progress Notes (Signed)
Pt has to been spotting and having a period off and on in a 30 day cycle she may spot 15 days out the 30 day.

## 2018-01-30 ENCOUNTER — Encounter: Payer: Self-pay | Admitting: Obstetrics and Gynecology

## 2018-01-30 NOTE — Progress Notes (Signed)
    GYNECOLOGY PROGRESS NOTE  Subjective:    Patient ID: Lori Oliver, female    DOB: [birth year], 47 y.o.   MRN: [id number]  HPI  Patient is a 47 y.o. G1P1 female who presents for complaints of irregular bleeding since being on Nexplanon contraception. She had the implant placed almost 1.5 years ago and notes that she will approximately 15 out of 30 days each month of bleeding. Also has days of spotting.  Notes that she has just been dealing with it, but now desires to do something about it.   The following portions of the patient's history were reviewed and updated as appropriate: allergies, current medications, past family history, past medical history, past social history, past surgical history and problem list.  Review of Systems Pertinent items noted in HPI and remainder of comprehensive ROS otherwise negative.   Objective:   Blood pressure (!) 146/87, pulse 67, height 5\' 7"  (1.702 m), weight 252 lb 12.8 oz (114.7 kg). General appearance: alert and no distress Remainder of exam deferred.    Assessment:   Breakthrough bleeding on [place] HTN   Plan:   Discussion had regarding management options for breakthrough bleeding, including 1) supplementing with estradiol for 2 weeks each month x 2-3 months or 2) removal of the Nexplanon with an alternative method of contraception.  Patient with a h/o HTN (and possibly newly diagnosed a-fib).  As she is over age 22 with significant medical history, would avoid long term use of contraception with estrogen-containing products. After lengthy discussion of contraception alternatives, she is unsure if she would desire an IUD (progesterone or copper) or would chose permanent sterlization.  Given handouts on both.  Patient notes that she would not like to have anything done until late March after she follows up with her PCP to determine if she will continue to need to take certain medications and f/u on possible diagnosis of a-fib.  Patient has  agreed for now to try the 2 week trial of estrogen supplementation to help with breakthrough bleeding.    A total of 25 minutes were spent face-to-face with the patient during this encounter and over half of that time involved counseling and coordination of care.   Rubie Maid, MD [facility]

## 2018-02-07 ENCOUNTER — Encounter: Payer: Self-pay | Admitting: Emergency Medicine

## 2018-02-07 ENCOUNTER — Other Ambulatory Visit: Payer: Self-pay

## 2018-02-07 ENCOUNTER — Emergency Department
Admission: EM | Admit: 2018-02-07 | Discharge: 2018-02-07 | Disposition: A | Discharge status: Home / Self Care | Payer: Managed Care, Other (non HMO) | Source: Home / Self Care | Attending: Emergency Medicine | Admitting: Emergency Medicine

## 2018-02-07 ENCOUNTER — Emergency Department: Payer: Managed Care, Other (non HMO)

## 2018-02-07 DIAGNOSIS — R002 Palpitations: Secondary | ICD-10-CM | POA: Diagnosis present

## 2018-02-07 DIAGNOSIS — I10 Essential (primary) hypertension: Secondary | ICD-10-CM | POA: Insufficient documentation

## 2018-02-07 DIAGNOSIS — E039 Hypothyroidism, unspecified: Secondary | ICD-10-CM | POA: Insufficient documentation

## 2018-02-07 DIAGNOSIS — R0602 Shortness of breath: Secondary | ICD-10-CM | POA: Insufficient documentation

## 2018-02-07 DIAGNOSIS — Z7901 Long term (current) use of anticoagulants: Secondary | ICD-10-CM | POA: Insufficient documentation

## 2018-02-07 DIAGNOSIS — F419 Anxiety disorder, unspecified: Secondary | ICD-10-CM | POA: Insufficient documentation

## 2018-02-07 DIAGNOSIS — Z79899 Other long term (current) drug therapy: Secondary | ICD-10-CM | POA: Insufficient documentation

## 2018-02-07 LAB — COMPREHENSIVE METABOLIC PANEL
ALBUMIN: 3.9 g/dL (ref 3.5–5.0)
ALT: 15 U/L (ref 14–54)
AST: 19 U/L (ref 15–41)
Alkaline Phosphatase: 59 U/L (ref 38–126)
Anion gap: 9 (ref 5–15)
BILIRUBIN TOTAL: 0.7 mg/dL (ref 0.3–1.2)
BUN: 13 mg/dL (ref 6–20)
CHLORIDE: 105 mmol/L (ref 101–111)
CO2: 24 mmol/L (ref 22–32)
Calcium: 9 mg/dL (ref 8.9–10.3)
Creatinine, Ser: 0.77 mg/dL (ref 0.44–1.00)
GFR calc Af Amer: >60 mL/min (ref >60)
GFR calc non Af Amer: >60 mL/min (ref >60)
GLUCOSE: 132 mg/dL — AB (ref 65–99)
Potassium: 3.7 mmol/L (ref 3.5–5.1)
Sodium: 138 mmol/L (ref 135–145)
TOTAL PROTEIN: 7.9 g/dL (ref 6.5–8.1)

## 2018-02-07 LAB — TROPONIN I: Troponin I: <0.03 ng/mL (ref <0.03)

## 2018-02-07 LAB — CBC
HEMATOCRIT: 37.9 % (ref 35.0–47.0)
Hemoglobin: 12.9 g/dL (ref 12.0–16.0)
MCH: 32.7 pg (ref 26.0–34.0)
MCHC: 34 g/dL (ref 32.0–36.0)
MCV: 96.1 fL (ref 80.0–100.0)
PLATELETS: 259 10*3/uL (ref 150–440)
RBC: 3.94 MIL/uL (ref 3.80–5.20)
RDW: 12.9 % (ref 11.5–14.5)
WBC: 7.9 10*3/uL (ref 3.6–11.0)

## 2018-02-07 NOTE — ED Provider Notes (Signed)
[facility] Emergency Department Provider Note   ____________________________________________   First MD Initiated Contact with Patient 02/07/18 1310     (approximate)  I have reviewed the triage vital signs and the nursing notes.   HISTORY  Chief Complaint Palpitations    HPI Lori Oliver is a 47 y.o. female the history of atrial fibrillation, currently treated with Eliquis  Patient reports this morning she had 2 brief episodes of what she described as "palpitations".  She reports they both occurred early this morning and lasted only briefly for maybe a matter of seconds to minutes.  She felt just very slightly short of breath during the episodes.  She reports she feels much better now, no ongoing symptoms.  Her symptoms had resolved by the time she got to the ER.  She has recently tapered off of sotalol, her doctor discontinued that for her 1 week ago, reports her cardiologist is Dr. Chancy Milroy.  No nausea or vomiting.  No chest pain.  Reports that she is also been seeing her doctor in questions of these episodes may be related to "anxiety".  Currently no ongoing symptoms.  Does have a history of thyroid disease, and she reports that she saw her doctor recently in the noted her TSH level was slightly elevated and have decided to just continue to follow it and currently not treated.  She had a sleep study last night reports she "[place] turned" during that but had no issues otherwise.  No leg swelling.  No history of blood clots.  She still takes her Eliquis regular Past Medical History:  Diagnosis Date  . Abnormal thyroid function test 05/16/2016  . Atrial fibrillation ([place])   . 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 Decreased libido 05/16/2016  . Hypertension   . Morbid obesity ([place]) 06/01/2015    Patient Active Problem List   Diagnosis Date Noted  . Anxiety about health 01/13/2018  . Atrial fibrillation with RVR ([place]) 12/01/2017  . Vitamin D  deficiency 07/08/2017  . Lower back pain 07/07/2017  . Screen for colon cancer 07/07/2017  . Morbid obesity ([place]) 07/07/2017  . Hypertension 07/11/2016  . Preventative health care 05/16/2016  . IFG (impaired fasting glucose) 05/16/2016  . 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 Abnormal thyroid function test 05/16/2016  . Decreased libido 05/16/2016  . Breast cancer screening 05/16/2016  . Breast lump on left side at 5 o'clock position 05/16/2016  . Contraception management 05/16/2016  . Surveillance for birth control, oral contraceptives 02/12/2016  . Bilateral thoracic back pain 06/01/2015  . Blood pressure elevated without history of HTN 06/01/2015  . History of breast lump 06/01/2015  . History of abnormal cervical Pap smear 06/01/2015    Past Surgical History:  Procedure Laterality Date  . BREAST BIOPSY Right 2015   benign  . HAMMER TOE SURGERY      Prior to Admission medications   Medication Sig Start Date End Date Taking? Authorizing Provider  apixaban (ELIQUIS) 5 MG TABS tablet Take 1 tablet (5 mg total) by mouth 2 (two) times daily. 12/02/17   Demetrios Loll, MD  estradiol (ESTRACE) 1 MG tablet Take 1 tablet (1 mg total) by mouth daily. Take 1 pill daily for 2 weeks.  Then off for 2 weeks. 01/28/18 01/28/19  Rubie Maid, MD  hydrochlorothiazide (HYDRODIURIL) 25 MG tablet Take 1 tablet (25 mg total) by mouth daily. 01/23/18   Lavonia Drafts, MD  sotalol (BETAPACE) 80 MG tablet Take 1 tablet (80  mg total) by mouth every 12 (twelve) hours. 12/02/17   Demetrios Loll, MD    Allergies Patient has no known allergies.  Family History  Problem Relation Age of Onset  . Hypertension Mother   . Heart disease Mother   . Arthritis Mother   . Depression Mother   . Stroke Maternal Grandmother     Social History Social History   Tobacco Use  . Smoking status: Never Smoker  . Smokeless tobacco: Never Used  Substance Use Topics  . Alcohol use: No    Alcohol/week: 0.0 oz     Frequency: Never  . Drug use: No    Review of Systems Constitutional: No fever/chills Eyes: No visual changes. ENT: No sore throat. Cardiovascular: Denies chest pain.  See HPI. Respiratory: Denies shortness of breath. Gastrointestinal: No abdominal pain.  No nausea, no vomiting.  No diarrhea.  No constipation. Genitourinary: Negative for dysuria. Musculoskeletal: Negative for back pain.  No thigh pain or leg swelling. Skin: Negative for rash. Neurological: Negative for headaches, focal weakness or numbness.    ____________________________________________   PHYSICAL EXAM:  VITAL SIGNS: ED Triage Vitals  Enc Vitals Group     BP 02/07/18 1054 132/78     Pulse Rate 02/07/18 1054 85     Resp 02/07/18 1054 18     Temp 02/07/18 1054 98.7 F (37.1 C)     Temp Source 02/07/18 1054 Oral     SpO2 02/07/18 1054 97 %     Weight 02/07/18 1104 252 lb (114.3 kg)     Height 02/07/18 1104 5\' 7"  (1.702 m)     Head Circumference --      Peak Flow --      Pain Score --      Pain Loc --      Pain Edu? --      Excl. in [place]? --     Constitutional: Alert and oriented. Well appearing and in no acute distress. Eyes: Conjunctivae are normal. Head: Atraumatic. Nose: No congestion/rhinnorhea. Mouth/Throat: Mucous membranes are moist. Neck: No stridor.   Cardiovascular: Normal rate, regular rhythm. Grossly normal heart sounds.  Good peripheral circulation. Respiratory: Normal respiratory effort.  No retractions. Lungs CTAB. Gastrointestinal: Soft and nontender. No distention. Musculoskeletal: No lower extremity tenderness nor edema. Neurologic:  Normal speech and language. No gross focal neurologic deficits are appreciated.  Skin:  Skin is warm, dry and intact. No rash noted. Psychiatric: Mood and affect are normal. Speech and behavior are normal.  ____________________________________________   LABS (all labs ordered are listed, but only abnormal results are displayed)  Labs Reviewed    COMPREHENSIVE METABOLIC PANEL - Abnormal; Notable for the following components:      Result Value   Glucose, Bld 132 (*)    All other components within normal limits  CBC  TROPONIN I   ____________________________________________  EKG  ED ECG REPORT I, Delman Kitten, the attending physician, personally viewed and interpreted this ECG.  Date: 02/07/2018 EKG Time: 1050 Rate: 90 Rhythm: normal sinus rhythm QRS Axis: normal Intervals: normal ST/T Wave abnormalities: normal Narrative Interpretation: no evidence of acute ischemia  ____________________________________________  RADIOLOGY   ____________________________________________   PROCEDURES  Procedure(s) performed: None  Procedures  Critical Care performed: No  ____________________________________________   INITIAL IMPRESSION / ASSESSMENT AND PLAN / ED COURSE  Pertinent labs & imaging results that were available during my care of the patient were reviewed by me and considered in my medical decision making (see chart for details).  Patient presents for evaluation of 2 brief episodes of palpitations.  No associated presyncopal episodes or lightheadedness.  Reports she did feel briefly short of breath.  She normally has a history of previous A. fib, currently in normal sinus rhythm and is discontinued her sotalol.  Difficult to tell if she may have had paroxysmal A. fib causing her symptoms, however now normal sinus rhythm, she is anticoagulated and compliant with her medical care.  Has an upcoming appointment with cardiology next week.  Her TSH is followed by her primary care doctor.  No signs or symptoms of acute cardiac disease today, no signs or symptoms to suggest dissection, pulmonary embolism, etc.  Discussed with the patient, comfortable with plan for follow-up with her cardiologist this week. Return precautions and treatment recommendations and follow-up discussed with the patient who is agreeable with the  plan.       ____________________________________________   FINAL CLINICAL IMPRESSION(S) / ED DIAGNOSES  Final diagnoses:  Palpitations      NEW MEDICATIONS STARTED DURING THIS VISIT:  New Prescriptions   No medications on file     Note:  This document was prepared using Dragon voice recognition software and may include unintentional dictation errors.     Delman Kitten, MD 02/07/18 1344

## 2018-02-07 NOTE — ED Triage Notes (Signed)
States had sleep study at [facility] last night. States went home and went to sleep and twice was awakened with feeling of heart racing. States she had that same sensation when she was having her pulse checked by paramedic. States is weaning off of Zoloft

## 2018-02-07 NOTE — ED Notes (Signed)
FN: pt ambulatory to desk with reports of waking up last night feeling like her heart was racing.

## 2018-02-07 NOTE — Discharge Instructions (Signed)
Follow-up with Dr. Humphrey Rolls (your heart doctor) this week.   Return to the Emergency Department (ED) if you experience any chest pain/pressure/tightness, difficulty breathing, or sudden sweating, or other symptoms that concern you.

## 2018-02-07 NOTE — ED Triage Notes (Signed)
Pt arrives POV to triage with c/o SOB. Pt was seen here earlier for the same today. Pt also reports that her heart is racing at this time. Pt is in NAD.

## 2018-02-07 NOTE — ED Notes (Signed)
Protocols DC'd per Jimmye Norman. Per Jimmye Norman, pt is flex appropriate. Flex is waiting on CT results.

## 2018-02-07 NOTE — ED Notes (Signed)
AAOx3.  Skin warm and dry.  NAD 

## 2018-02-07 NOTE — ED Notes (Signed)
Pt resting in waiting room with eyes closed; opened when name called; understands she's the longest wait and will be back as soon as a treatment room is available

## 2018-02-08 ENCOUNTER — Emergency Department
Admission: EM | Admit: 2018-02-08 | Discharge: 2018-02-08 | Disposition: A | Discharge status: Home / Self Care | Payer: Managed Care, Other (non HMO) | Attending: Emergency Medicine | Admitting: Emergency Medicine

## 2018-02-08 DIAGNOSIS — F419 Anxiety disorder, unspecified: Secondary | ICD-10-CM

## 2018-02-08 DIAGNOSIS — R002 Palpitations: Secondary | ICD-10-CM

## 2018-02-08 LAB — CBC WITH DIFFERENTIAL/PLATELET
BASOS PCT: 1 %
Basophils Absolute: 0.1 10*3/uL (ref 0–0.1)
EOS ABS: 0.1 10*3/uL (ref 0–0.7)
EOS PCT: 2 %
HCT: 36.9 % (ref 35.0–47.0)
Hemoglobin: 12.5 g/dL (ref 12.0–16.0)
LYMPHS ABS: 2.5 10*3/uL (ref 1.0–3.6)
Lymphocytes Relative: 28 %
MCH: 32.5 pg (ref 26.0–34.0)
MCHC: 34 g/dL (ref 32.0–36.0)
MCV: 95.8 fL (ref 80.0–100.0)
MONO ABS: 0.6 10*3/uL (ref 0.2–0.9)
MONOS PCT: 7 %
NEUTROS PCT: 62 %
Neutro Abs: 5.7 10*3/uL (ref 1.4–6.5)
PLATELETS: 259 10*3/uL (ref 150–440)
RBC: 3.85 MIL/uL (ref 3.80–5.20)
RDW: 13.1 % (ref 11.5–14.5)
WBC: 9.1 10*3/uL (ref 3.6–11.0)

## 2018-02-08 LAB — BASIC METABOLIC PANEL
Anion gap: 8 (ref 5–15)
BUN: 15 mg/dL (ref 6–20)
CALCIUM: 9 mg/dL (ref 8.9–10.3)
CO2: 23 mmol/L (ref 22–32)
CREATININE: 0.69 mg/dL (ref 0.44–1.00)
Chloride: 105 mmol/L (ref 101–111)
GFR calc non Af Amer: >60 mL/min (ref >60)
Glucose, Bld: 111 mg/dL — ABNORMAL HIGH (ref 65–99)
Potassium: 3.4 mmol/L — ABNORMAL LOW (ref 3.5–5.1)
SODIUM: 136 mmol/L (ref 135–145)

## 2018-02-08 LAB — TSH: TSH: 2.963 u[IU]/mL (ref 0.350–4.500)

## 2018-02-08 LAB — TROPONIN I

## 2018-02-08 MED ORDER — LORAZEPAM 1 MG PO TABS
1.0000 mg | ORAL_TABLET | Freq: Once | ORAL | Status: AC
  Administered 2018-02-08: 1 mg via ORAL
  Filled 2018-02-08: qty 1

## 2018-02-08 MED ORDER — LORAZEPAM 1 MG PO TABS: 1.0000 mg | ORAL_TABLET | Freq: Three times a day (TID) | ORAL | 0 refills | Status: DC | PRN

## 2018-02-08 NOTE — ED Provider Notes (Signed)
[facility] Emergency Department Provider Note   ____________________________________________   First MD Initiated Contact with Patient 02/08/18 0155     (approximate)  I have reviewed the triage vital signs and the nursing notes.   HISTORY  Chief Complaint Chest Pain    HPI Lori Oliver is a 47 y.o. female who presents to the ED from home with a chief complaint of palpitations and shortness of breath.  Patient was diagnosed with atrial fibrillation a few months ago and has been trying to adjust to her new diagnosis.  She was on sotalol which was discontinued by her cardiologist 1 week ago.  Remains on Eliquis.  She was seen in the ED on 2/8 and prescribed HCTZ for elevated blood pressure.  She was seen in the ED yesterday for palpitations; noted to be in normal sinus rhythm.  Returns tonight for palpitations felt while she was laying down trying to go to bed.  She immediately became anxious with shortness of breath.  Tells me her doctors have told her anxiety is part of it but she has not been willing to take anxiolytics because she is not used to taking medicines.  Denies recent fever, chills, chest pain, abdominal pain, nausea, vomiting, diarrhea.  Denies recent travel or trauma.  States she has been taking her anticoagulant and antihypertensive as directed.   Past Medical History:  Diagnosis Date  . Abnormal thyroid function test 05/16/2016  . Atrial fibrillation ([place])   . 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 Decreased libido 05/16/2016  . Hypertension   . Morbid obesity ([place]) 06/01/2015    Patient Active Problem List   Diagnosis Date Noted  . Anxiety about health 01/13/2018  . Atrial fibrillation with RVR ([place]) 12/01/2017  . Vitamin D deficiency 07/08/2017  . Lower back pain 07/07/2017  . Screen for colon cancer 07/07/2017  . Morbid obesity ([place]) 07/07/2017  . Hypertension 07/11/2016  . Preventative health care 05/16/2016  . IFG (impaired  fasting glucose) 05/16/2016  . 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 Abnormal thyroid function test 05/16/2016  . Decreased libido 05/16/2016  . Breast cancer screening 05/16/2016  . Breast lump on left side at 5 o'clock position 05/16/2016  . Contraception management 05/16/2016  . Surveillance for birth control, oral contraceptives 02/12/2016  . Bilateral thoracic back pain 06/01/2015  . Blood pressure elevated without history of HTN 06/01/2015  . History of breast lump 06/01/2015  . History of abnormal cervical Pap smear 06/01/2015    Past Surgical History:  Procedure Laterality Date  . BREAST BIOPSY Right 2015   benign  . HAMMER TOE SURGERY      Prior to Admission medications   Medication Sig Start Date End Date Taking? Authorizing Provider  apixaban (ELIQUIS) 5 MG TABS tablet Take 1 tablet (5 mg total) by mouth 2 (two) times daily. 12/02/17  Yes Demetrios Loll, MD  estradiol (ESTRACE) 1 MG tablet Take 1 tablet (1 mg total) by mouth daily. Take 1 pill daily for 2 weeks.  Then off for 2 weeks. 01/28/18 01/28/19 Yes Rubie Maid, MD  hydrochlorothiazide (HYDRODIURIL) 25 MG tablet Take 1 tablet (25 mg total) by mouth daily. 01/23/18  Yes Lavonia Drafts, MD  LORazepam (ATIVAN) 1 MG tablet Take 1 tablet (1 mg total) by mouth every 8 (eight) hours as needed for anxiety. 02/08/18   Paulette Blanch, MD    Allergies Patient has no known allergies.  Family History  Problem Relation Age of Onset  .  Hypertension Mother   . Heart disease Mother   . Arthritis Mother   . Depression Mother   . Stroke Maternal Grandmother     Social History Social History   Tobacco Use  . Smoking status: Never Smoker  . Smokeless tobacco: Never Used  Substance Use Topics  . Alcohol use: No    Alcohol/week: 0.0 oz    Frequency: Never  . Drug use: No    Review of Systems  Constitutional: No fever/chills. Eyes: No visual changes. ENT: No sore throat. Cardiovascular: Denies chest  pain. Respiratory: Denies shortness of breath. Gastrointestinal: No abdominal pain.  No nausea, no vomiting.  No diarrhea.  No constipation. Genitourinary: Negative for dysuria. Musculoskeletal: Negative for back pain. Skin: Negative for rash. Neurological: Negative for headaches, focal weakness or numbness. Psychiatric:Positive for anxiety.  ____________________________________________   PHYSICAL EXAM:  VITAL SIGNS: ED Triage Vitals  Enc Vitals Group     BP 02/07/18 2151 (!) 170/106     Pulse Rate 02/07/18 2151 (!) 117     Resp 02/07/18 2151 20     Temp 02/07/18 2151 (!) 97.5 F (36.4 C)     Temp Source 02/07/18 2151 Oral     SpO2 02/07/18 2151 100 %     Weight 02/07/18 2148 252 lb (114.3 kg)     Height 02/07/18 2148 5\' 7"  (1.702 m)     Head Circumference --      Peak Flow --      Pain Score 02/07/18 2147 0     Pain Loc --      Pain Edu? --      Excl. in [place]? --     Constitutional: Alert and oriented. Well appearing and in no acute distress. Eyes: Conjunctivae are normal. PERRL. EOMI. Head: Atraumatic. Nose: No congestion/rhinnorhea. Mouth/Throat: Mucous membranes are moist.  Oropharynx non-erythematous. Neck: No stridor.   Cardiovascular: Normal rate, regular rhythm. Grossly normal heart sounds.  Good peripheral circulation. Respiratory: Normal respiratory effort.  No retractions. Lungs CTAB. Gastrointestinal: Soft and nontender. No distention. No abdominal bruits. No CVA tenderness. Musculoskeletal: No lower extremity tenderness nor edema.  No joint effusions. Neurologic:  Normal speech and language. No gross focal neurologic deficits are appreciated. No gait instability. Skin:  Skin is warm, dry and intact. No rash noted. Psychiatric: Mood and affect are mildly anxious.  Speech and behavior are normal.  ____________________________________________   LABS (all labs ordered are listed, but only abnormal results are displayed)  Labs Reviewed  BASIC METABOLIC  PANEL - Abnormal; Notable for the following components:      Result Value   Potassium 3.4 (*)    Glucose, Bld 111 (*)    All other components within normal limits  CBC WITH DIFFERENTIAL/PLATELET  TROPONIN I  TSH   ____________________________________________  EKG  ED ECG REPORT I, SUNG,JADE J, the attending physician, personally viewed and interpreted this ECG.   Date: 02/08/2018  EKG Time: 2149  Rate: 109  Rhythm: sinus tachycardia  Axis: Normal  Intervals:none  ST&T Change: Nonspecific  ED ECG REPORT I, SUNG,JADE J, the attending physician, personally viewed and interpreted this ECG.   Date: 02/08/2018  EKG Time: 0414  Rate: 72  Rhythm: normal EKG, normal sinus rhythm  Axis: Normal  Intervals:none  ST&T Change: Nonspecific   ____________________________________________  RADIOLOGY  ED MD interpretation: Chest x-ray taken on most recent visit yesterday afternoon - no acute cardiopulmonary process  Official radiology report(s): Dg Chest 2 View  Result Date: 02/07/2018 CLINICAL  DATA:  47 year old female with shortness of breath. EXAM: CHEST  2 VIEW COMPARISON:  Chest radiograph dated 12/23/2017 FINDINGS: The heart size and mediastinal contours are within normal limits. Both lungs are clear. The visualized skeletal structures are unremarkable. IMPRESSION: No active cardiopulmonary disease. Electronically Signed   By: Anner Crete M.D.   On: 02/07/2018 22:34    ____________________________________________   PROCEDURES  Procedure(s) performed: None  Procedures  Critical Care performed: No  ____________________________________________   INITIAL IMPRESSION / ASSESSMENT AND PLAN / ED COURSE  As part of my medical decision making, I reviewed the following data within the [employer] notes reviewed and incorporated, Labs reviewed, EKG interpreted, Old chart reviewed, Radiograph reviewed and Notes from prior ED visits.   47 year old  female with recent diagnosis of atrial fibrillation, off sotalol for 1 week, remains on Eliquis and hctz.  EKG demonstrates normal sinus rhythm.  Long discussion with patient; I do feel that anxiety is a factor contributing to her physical symptoms.  Offered her low-dose anxiolytic which she accepts tonight.  We will repeat lab work/troponin.  Clinical Course as of Feb 08 454  Sun Feb 08, 2018  5284 Patient resting comfortably no acute distress.  Improved after Ativan.  Updated patient and spouse of laboratory results.  Repeat EKG demonstrates normal sinus rhythm.  Do not see indication at this time to start patient on as needed beta blockers.  Patient has a cardiology appointment next week.  Will prescribe low-dose Ativan for anxiety.  Strict return precautions given.  Both verbalize understanding and agree with plan of care.  [JS]    Clinical Course User Index [JS] Paulette Blanch, MD     ____________________________________________   FINAL CLINICAL IMPRESSION(S) / ED DIAGNOSES  Final diagnoses:  Anxiety  Palpitations     ED Discharge Orders        Ordered    LORazepam (ATIVAN) 1 MG tablet  Every 8 hours PRN     02/08/18 0447       Note:  This document was prepared using Dragon voice recognition software and may include unintentional dictation errors.    Paulette Blanch, MD 02/08/18 [id number]

## 2018-02-08 NOTE — ED Notes (Addendum)
Patient states stop taking medication for a fib approximately 1 week ago because every time they checked she was not in a fib and it was making her feel bad.  Patient reports since stopping the medication feels like heart is racing.  Patient resting quietly no acute distress noted at this time.  Reports tonight she was short of breath and heart racing.  States had similar symptoms earlier in the day and was seen had blood drawn, but everything came back normal.

## 2018-02-08 NOTE — Discharge Instructions (Signed)
1.  Continue medicines as directed by your doctor. 2.  You may take Ativan as needed for anxiety. 3.  Return to the ER for worsening symptoms, persistent vomiting, difficulty breathing or other concerns.

## 2018-02-09 ENCOUNTER — Other Ambulatory Visit: Payer: Self-pay

## 2018-02-09 ENCOUNTER — Telehealth: Payer: Self-pay | Admitting: Family Medicine

## 2018-02-09 DIAGNOSIS — E876 Hypokalemia: Secondary | ICD-10-CM

## 2018-02-09 NOTE — Patient Outreach (Signed)
[name]) Care Management  02/09/2018  Lori Oliver Apr 27, 1971 [id number]   Telephone Screen Referral Date : 02/09/2018 Referral Source:[facility] Ed Census Referral Reason: ED Utilization Insurance: [employer] attempt # 1 To patient.   Telephone call to the patient for ED Utilization. HIPAA verified with patient.  Discussed and offered [employer] care management services with patient. The patient stated that she didn't need the services at this time. Will proceed with case closure.   Plan: RN Health Coach will notify care management assistant of case status.      Lazaro Arms RN, BSN, [place] Direct Dial:  [phone] Fax: [phone]

## 2018-02-09 NOTE — Telephone Encounter (Signed)
Copied from [place] [id number]. Topic: Quick Communication - See Telephone Encounter >> Feb 09, 2018  2:32 PM Robina Ade, Helene Kelp D wrote: CRM for notification. See Telephone encounter for: 02/09/18. Patient called and would like to talk to Dr. Sanda Klein about some health issues she is having and a medication change she would like to do. Please call patient back, thanks.

## 2018-02-10 DIAGNOSIS — E876 Hypokalemia: Secondary | ICD-10-CM | POA: Insufficient documentation

## 2018-02-10 MED ORDER — POTASSIUM CHLORIDE ER 10 MEQ PO TBCR: 10.0000 meq | EXTENDED_RELEASE_TABLET | Freq: Every day | ORAL | 0 refills | Status: DC

## 2018-02-10 NOTE — Assessment & Plan Note (Signed)
Check Mg2+ and K+

## 2018-02-10 NOTE — Telephone Encounter (Signed)
Patient says she has been having a lot of heart palpitations She will call the heart doctor Feels like her heart is beating fast all the time Did the sleep study on Friday She has an appt on Friday to get results of that sleep study Her K+ was low in the ER Let's start K+ replacement See cardiologist Recheck K+ in 2 weeks

## 2018-02-11 ENCOUNTER — Emergency Department: Payer: Managed Care, Other (non HMO)

## 2018-02-11 ENCOUNTER — Ambulatory Visit: Payer: Self-pay | Admitting: *Deleted

## 2018-02-11 ENCOUNTER — Other Ambulatory Visit: Payer: Self-pay

## 2018-02-11 ENCOUNTER — Emergency Department
Admission: EM | Admit: 2018-02-11 | Discharge: 2018-02-11 | Disposition: A | Discharge status: Home / Self Care | Payer: Managed Care, Other (non HMO) | Attending: Emergency Medicine | Admitting: Emergency Medicine

## 2018-02-11 DIAGNOSIS — Z7901 Long term (current) use of anticoagulants: Secondary | ICD-10-CM | POA: Diagnosis not present

## 2018-02-11 DIAGNOSIS — R52 Pain, unspecified: Secondary | ICD-10-CM

## 2018-02-11 DIAGNOSIS — Z79899 Other long term (current) drug therapy: Secondary | ICD-10-CM | POA: Insufficient documentation

## 2018-02-11 DIAGNOSIS — M79602 Pain in left arm: Secondary | ICD-10-CM | POA: Diagnosis not present

## 2018-02-11 DIAGNOSIS — I10 Essential (primary) hypertension: Secondary | ICD-10-CM | POA: Diagnosis not present

## 2018-02-11 LAB — CBC
HCT: 36.8 % (ref 35.0–47.0)
HEMOGLOBIN: 12.5 g/dL (ref 12.0–16.0)
MCH: 32.6 pg (ref 26.0–34.0)
MCHC: 34 g/dL (ref 32.0–36.0)
MCV: 96 fL (ref 80.0–100.0)
Platelets: 261 10*3/uL (ref 150–440)
RBC: 3.83 MIL/uL (ref 3.80–5.20)
RDW: 13 % (ref 11.5–14.5)
WBC: 8.1 10*3/uL (ref 3.6–11.0)

## 2018-02-11 LAB — BASIC METABOLIC PANEL
ANION GAP: 7 (ref 5–15)
BUN: 14 mg/dL (ref 6–20)
CHLORIDE: 105 mmol/L (ref 101–111)
CO2: 25 mmol/L (ref 22–32)
CREATININE: 0.93 mg/dL (ref 0.44–1.00)
Calcium: 8.7 mg/dL — ABNORMAL LOW (ref 8.9–10.3)
GFR calc non Af Amer: >60 mL/min (ref >60)
Glucose, Bld: 100 mg/dL — ABNORMAL HIGH (ref 65–99)
Potassium: 3.6 mmol/L (ref 3.5–5.1)
Sodium: 137 mmol/L (ref 135–145)

## 2018-02-11 LAB — TROPONIN I: Troponin I: <0.03 ng/mL (ref <0.03)

## 2018-02-11 MED ORDER — IBUPROFEN 600 MG PO TABS
600.0000 mg | ORAL_TABLET | Freq: Once | ORAL | Status: AC
  Administered 2018-02-11: 600 mg via ORAL
  Filled 2018-02-11: qty 1

## 2018-02-11 MED ORDER — CYCLOBENZAPRINE HCL 10 MG PO TABS: 10.0000 mg | ORAL_TABLET | Freq: Three times a day (TID) | ORAL | 0 refills | Status: AC | PRN

## 2018-02-11 NOTE — Telephone Encounter (Signed)
Dr Sanda Klein- K+ low  And she is to go on medication for that.  Patient reports that she has had  Left arm pain since last night- she reports this pain is present without moving her arm. It radiates into her shoulder. She is not having any other symptoms- but because she has started this new symptom- protocol states she has to be referred to ED.  Reason for Disposition . [1] Age > 40 AND [2] no obvious cause AND [3] pain even when not moving the arm    (Exception: pain is clearly made worse by moving arm or bending neck)  Answer Assessment - Initial Assessment Questions 1. ONSET: "When did the pain start?"     Started yesterday- then got worse today 2. LOCATION: "Where is the pain located?"     Left arm radiates into shoulder 3. PAIN: "How bad is the pain?" (Scale 1-10; or mild, moderate, severe)   - MILD (1-3): doesn't interfere with normal activities   - MODERATE (4-7): interferes with normal activities (e.g., work or school) or awakens from sleep   - SEVERE (8-10): excruciating pain, unable to do any normal activities, unable to hold a cup of water     moderates 4. WORK OR EXERCISE: "Has there been any recent work or exercise that involved this part of the body?"     no 5. CAUSE: "What do you think is causing the arm pain?"     Patient has a lot changes in medications 6. OTHER SYMPTOMS: "Do you have any other symptoms?" (e.g., neck pain, swelling, rash, fever, numbness, weakness)     no 7. PREGNANCY: "Is there any chance you are pregnant?" "When was your last menstrual period?"     nexplanon  Protocols used: ARM PAIN-A-AH

## 2018-02-11 NOTE — ED Provider Notes (Signed)
[facility] Emergency Department Provider Note ____________________________________________   First MD Initiated Contact with Patient 02/11/18 1744     (approximate)  I have reviewed the triage vital signs and the nursing notes.   HISTORY  Chief Complaint Arm Pain    HPI Lori Oliver is a 47 y.o. female with past medical history as noted below including atrial fibrillation and hypertension who presents with left arm pain, lasting for the last day, atraumatic, mainly in the upper arm and shoulder, and somewhat in the chest, not worse with movement, not associated with swelling or any cutaneous findings, nor with numbness or weakness.  No prior history of this pain.  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  She denies associated shortness of breath, weakness or lightheadedness, cough, or fever.  Past Medical History:  Diagnosis Date  . Abnormal thyroid function test 05/16/2016  . Atrial fibrillation ([place])   . 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 Decreased libido 05/16/2016  . Hypertension   . Morbid obesity ([place]) 06/01/2015    Patient Active Problem List   Diagnosis Date Noted  . Hypokalemia 02/10/2018  . Anxiety about health 01/13/2018  . Atrial fibrillation with RVR ([place]) 12/01/2017  . Vitamin D deficiency 07/08/2017  . Lower back pain 07/07/2017  . Screen for colon cancer 07/07/2017  . Morbid obesity ([place]) 07/07/2017  . Hypertension 07/11/2016  . Preventative health care 05/16/2016  . IFG (impaired fasting glucose) 05/16/2016  . 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 Abnormal thyroid function test 05/16/2016  . Decreased libido 05/16/2016  . Breast cancer screening 05/16/2016  . Breast lump on left side at 5 o'clock position 05/16/2016  . Contraception management 05/16/2016  . Surveillance for birth control, oral contraceptives  02/12/2016  . Bilateral thoracic back pain 06/01/2015  . Blood pressure elevated without history of HTN 06/01/2015  . History of breast lump 06/01/2015  . History of abnormal cervical Pap smear 06/01/2015    Past Surgical History:  Procedure Laterality Date  . BREAST BIOPSY Right 2015   benign  . HAMMER TOE SURGERY      Prior to Admission medications   Medication Sig Start Date End Date Taking? Authorizing Provider  apixaban (ELIQUIS) 5 MG TABS tablet Take 1 tablet (5 mg total) by mouth 2 (two) times daily. 12/02/17   Demetrios Loll, MD  cyclobenzaprine (FLEXERIL) 10 MG tablet Take 1 tablet (10 mg total) by mouth 3 (three) times daily as needed for up to 5 days for muscle spasms. 02/11/18 02/16/18  Arta Silence, MD  estradiol (ESTRACE) 1 MG tablet Take 1 tablet (1 mg total) by mouth daily. Take 1 pill daily for 2 weeks.  Then off for 2 weeks. 01/28/18 01/28/19  Rubie Maid, MD  hydrochlorothiazide (HYDRODIURIL) 25 MG tablet Take 1 tablet (25 mg total) by mouth daily. 01/23/18   Lavonia Drafts, MD  LORazepam (ATIVAN) 1 MG tablet Take 1 tablet (1 mg total) by mouth every 8 (eight) hours as needed for anxiety. 02/08/18   Paulette Blanch, MD  potassium chloride (KLOR-CON 10) 10 MEQ tablet Take 1 tablet (10 mEq total) by mouth daily. 02/10/18   Arnetha Courser, MD    Allergies Patient has no known allergies.  Family History  Problem Relation Age of Onset  . Hypertension Mother   . Heart disease Mother   . Arthritis Mother   .  Depression Mother   . Stroke Maternal Grandmother     Social History Social History   Tobacco Use  . Smoking status: Never Smoker  . Smokeless tobacco: Never Used  Substance Use Topics  . Alcohol use: No    Alcohol/week: 0.0 oz    Frequency: Never  . Drug use: No    Review of Systems  Constitutional: No fever/chills. Eyes: No visual changes. ENT: No sore throat. Cardiovascular: Denies chest pain. Respiratory: Denies shortness of  breath. Gastrointestinal: No nausea, no vomiting.  No diarrhea.  Genitourinary: Negative for flank pain.  Musculoskeletal: Negative for back pain. Skin: Negative for rash or swelling. Neurological: Negative for focal weakness or numbness.   ____________________________________________   PHYSICAL EXAM:  VITAL SIGNS: ED Triage Vitals  Enc Vitals Group     BP 02/11/18 1512 (!) 146/81     Pulse Rate 02/11/18 1512 65     Resp 02/11/18 1512 18     Temp 02/11/18 1512 97.6 F (36.4 C)     Temp Source 02/11/18 1512 Oral     SpO2 02/11/18 1512 100 %     Weight 02/11/18 1513 252 lb (114.3 kg)     Height 02/11/18 1513 5\' 7"  (1.702 m)     Head Circumference --      Peak Flow --      Pain Score 02/11/18 1512 0     Pain Loc --      Pain Edu? --      Excl. in [place]? --     Constitutional: Alert and oriented. Well appearing and in no acute distress. Eyes: Conjunctivae are normal.  Head: Atraumatic. Nose: No congestion/rhinnorhea. Mouth/Throat: Mucous membranes are moist.   Neck: Normal range of motion.  Cardiovascular: Normal rate, regular rhythm. Grossly normal heart sounds.  Good peripheral circulation. Respiratory: Normal respiratory effort.  No retractions. Lungs CTAB. Gastrointestinal:  No distention.  Musculoskeletal:  Extremities warm and well perfused.  Full range of motion to left shoulder and elbow.  No focal tenderness to the upper arm.  No erythema, induration, fluctuance, masses or cutaneous findings.  2+ radial pulse.   Neurologic:  Normal speech and language. No gross focal neurologic deficits are appreciated.  Motor and sensory intact in all distributions of left upper extremity. Skin:  Skin is warm and dry. No rash noted. Psychiatric: Mood and affect are normal. Speech and behavior are normal.  ____________________________________________   LABS (all labs ordered are listed, but only abnormal results are displayed)  Labs Reviewed  BASIC METABOLIC PANEL - Abnormal;  Notable for the following components:      Result Value   Glucose, Bld 100 (*)    Calcium 8.7 (*)    All other components within normal limits  CBC  TROPONIN I  POC URINE PREG, ED   ____________________________________________  EKG  ED ECG REPORT I, Arta Silence, the attending physician, personally viewed and interpreted this ECG.  Date: 02/11/2018 EKG Time: 1518 Rate: 65 Rhythm: normal sinus rhythm QRS Axis: normal Intervals: normal ST/T Wave abnormalities: normal Narrative Interpretation: no evidence of acute ischemia  ____________________________________________  RADIOLOGY  CXR: No focal infiltrate XR L shoulder/L humerus: No acute bony abnormality  ____________________________________________   PROCEDURES  Procedure(s) performed: No  Procedures  Critical Care performed: No ____________________________________________   INITIAL IMPRESSION / ASSESSMENT AND PLAN / ED COURSE  Pertinent labs & imaging results that were available during my care of the patient were reviewed by me and considered in my medical decision  making (see chart for details).  47 year old female with history of hypertension, A. fib, and other PMH as noted above presents with left upper arm atraumatic pain for the last day with no significant associated symptoms.  She does not have any chest pain although she does gesture towards her upper chest when describing the arm pain.  On exam, she is well-appearing, and the remainder the exam is unremarkable as described above.  There are no cutaneous findings, full range of motion all joints, no swelling, and the arm is neuro/vascular intact.  Differential includes muscle strain/spasm, less likely nerve related pain, or other benign cause.   Basic labs and troponin obtained from triage and are negative.  EKG is normal.  No indication for further cardiac workup.  Plan for x-rays of the left humerus and shoulder, and likely discharge with muscle  relaxant.    ----------------------------------------- 7:48 PM on 02/11/2018 -----------------------------------------  X-rays are negative for acute findings.  The possible foreign body noted by the radiologist corresponds either with part of the gown or some other foreign object outside the body.  Patient has no pain in this area underneath the arm, and no tenderness or evidence of foreign body there.  No history of any injury there.  Given negative workup, most likely diagnosis is muscle strain or stress injury.  Given that the patient is on Eliquis I will not prescribe NSAIDs although I will give 1 dose of ibuprofen here.  I will discharge with prescription for Flexeril.  Patient has a plan to see her primary care doctor tomorrow.  She was instructed to continue with this plan.  Return precautions given, and the patient expresses understanding.  ____________________________________________   FINAL CLINICAL IMPRESSION(S) / ED DIAGNOSES  Final diagnoses:  Left arm pain      NEW MEDICATIONS STARTED DURING THIS VISIT:  New Prescriptions   CYCLOBENZAPRINE (FLEXERIL) 10 MG TABLET    Take 1 tablet (10 mg total) by mouth 3 (three) times daily as needed for up to 5 days for muscle spasms.     Note:  This document was prepared using Dragon voice recognition software and may include unintentional dictation errors.    Arta Silence, MD 02/11/18 1949

## 2018-02-11 NOTE — ED Triage Notes (Signed)
Pt is here with c/o sharp/achy pain in the left shoulder/arm since yesterday. Pt states she had an episode of a-fib in December but currently is not, states she has been having palpitations since intermittently. Denies chest pain, SOB or edema.Marland Kitchen

## 2018-02-11 NOTE — Discharge Instructions (Signed)
Return to the ER for new, worsening, or persistent upper arm pain, weakness or numbness, swelling, rash, fevers, chest pain, difficulty breathing, or any other new or worsening symptoms that concern you.  You may take the muscle relaxant as needed but should avoid taking it while driving or operating machinery.  Follow-up with your doctor tomorrow as planned.

## 2018-02-19 ENCOUNTER — Telehealth: Payer: Self-pay | Admitting: Family Medicine

## 2018-02-19 DIAGNOSIS — I480 Paroxysmal atrial fibrillation: Secondary | ICD-10-CM | POA: Insufficient documentation

## 2018-02-19 NOTE — Telephone Encounter (Signed)
Referral to cardiologist entered I returned her call; left detailed message If short-acting metoprolol being used PRN, should not have trouble weaning off I've put referral in to cardiologist I think very highly of and she should hear about that appointment soon If there was anything else she wanted to chat about, or if I didn't answer her questions, please do call back and I'll be glad to talk with her

## 2018-02-19 NOTE — Telephone Encounter (Signed)
Called pt back. Cardiologist Dr Chancy Milroy put her on metotoporol, but pt has not started yet. Has some questions like if she starts this med would she have to be winged off of it? Pt does not really care for this provider and wants to see about getting a second opinion and new referral.

## 2018-02-19 NOTE — Assessment & Plan Note (Signed)
Refer to another cardiologist at patient's request

## 2018-02-19 NOTE — Telephone Encounter (Signed)
Copied from [place] [id number]. Topic: Quick Communication - See Telephone Encounter >> Feb 19, 2018  8:42 AM Ether Griffins B wrote: CRM for notification. See Telephone encounter for:  Pt has some questions about medication (metortrolol - er 25 mg) her cardiologist has put her own and told her to only take when needed. Pt would also like a referral to another cardiologist.  02/19/18.

## 2018-02-28 ENCOUNTER — Other Ambulatory Visit: Payer: Self-pay

## 2018-02-28 ENCOUNTER — Emergency Department
Admission: EM | Admit: 2018-02-28 | Discharge: 2018-02-28 | Disposition: A | Discharge status: Home / Self Care | Payer: Managed Care, Other (non HMO) | Attending: Emergency Medicine | Admitting: Emergency Medicine

## 2018-02-28 ENCOUNTER — Encounter: Payer: Self-pay | Admitting: Emergency Medicine

## 2018-02-28 ENCOUNTER — Emergency Department: Payer: Managed Care, Other (non HMO)

## 2018-02-28 DIAGNOSIS — Z7901 Long term (current) use of anticoagulants: Secondary | ICD-10-CM | POA: Insufficient documentation

## 2018-02-28 DIAGNOSIS — Y939 Activity, unspecified: Secondary | ICD-10-CM | POA: Diagnosis not present

## 2018-02-28 DIAGNOSIS — X501XXA Overexertion from prolonged static or awkward postures, initial encounter: Secondary | ICD-10-CM | POA: Insufficient documentation

## 2018-02-28 DIAGNOSIS — S83412A Sprain of medial collateral ligament of left knee, initial encounter: Secondary | ICD-10-CM | POA: Diagnosis not present

## 2018-02-28 DIAGNOSIS — Y929 Unspecified place or not applicable: Secondary | ICD-10-CM | POA: Diagnosis not present

## 2018-02-28 DIAGNOSIS — Y999 Unspecified external cause status: Secondary | ICD-10-CM | POA: Diagnosis not present

## 2018-02-28 DIAGNOSIS — Z79899 Other long term (current) drug therapy: Secondary | ICD-10-CM | POA: Insufficient documentation

## 2018-02-28 DIAGNOSIS — I10 Essential (primary) hypertension: Secondary | ICD-10-CM | POA: Insufficient documentation

## 2018-02-28 DIAGNOSIS — S8992XA Unspecified injury of left lower leg, initial encounter: Secondary | ICD-10-CM | POA: Diagnosis present

## 2018-02-28 MED ORDER — ACETAMINOPHEN-CODEINE #3 300-30 MG PO TABS: 1.0000 | ORAL_TABLET | Freq: Four times a day (QID) | ORAL | 0 refills | Status: DC | PRN

## 2018-02-28 MED ORDER — ACETAMINOPHEN-CODEINE #3 300-30 MG PO TABS
1.0000 | ORAL_TABLET | Freq: Once | ORAL | Status: AC
  Administered 2018-02-28: 1 via ORAL
  Filled 2018-02-28: qty 1

## 2018-02-28 NOTE — ED Triage Notes (Signed)
Fell down 3 steps this am, pain L knee. Ambulatory in ED.

## 2018-02-28 NOTE — Discharge Instructions (Signed)
Your exam is essentially normal. Your exam shows a medial knee sprain. Apply ice to reduce pain and swelling (15 minutes). Take OTC Tylenol as needed for non-drowsy pain relief. Take Tylenol #3 for pain relief. See Dr. Roland Rack as needed.

## 2018-03-02 ENCOUNTER — Ambulatory Visit: Payer: Self-pay

## 2018-03-02 NOTE — Telephone Encounter (Signed)
Patient called in with c/o "constant swallowing."  She says "I woke up this morning and I had this problem of swallowing. I don't have difficulty swallowing, but it's almost like my throat spasms and I have to swallow. Since I've been talking to you, it's happened about 5 times." I asked about her saliva, she says "It's not excessive, my mouth doesn't water." I asked about her breathing, she says "it doesn't affect my breathing any." I asked does it make her cough, she says "it doesn't make me cough. I'm just nervous about it and don't know what is going on." According to protocol, see PCP within 2 weeks. Her provider has no available appointments this week, I advised the patient she can be seen by another provider tomorrow is the earliest, she says "I wanted to be see this afternoon, but I will wait until tomorrow. I don't feel like it's an emergency that requires going to the emergency room." Appointment made for tomorrow at 1500 with Lori Ensign, FNP-C, care advice given, patient verbalized understanding.  Reason for Disposition . Nursing judgment  Protocols used: NO GUIDELINE OR REFERENCE AVAILABLE-A-AH

## 2018-03-03 ENCOUNTER — Ambulatory Visit (INDEPENDENT_AMBULATORY_CARE_PROVIDER_SITE_OTHER): Payer: Managed Care, Other (non HMO) | Admitting: Family Medicine

## 2018-03-03 ENCOUNTER — Other Ambulatory Visit: Payer: Self-pay

## 2018-03-03 ENCOUNTER — Encounter: Payer: Self-pay | Admitting: Family Medicine

## 2018-03-03 VITALS — BP 116/76 | HR 70 | Temp 98.1°F | Resp 14 | Wt 257.6 lb

## 2018-03-03 DIAGNOSIS — R1110 Vomiting, unspecified: Secondary | ICD-10-CM | POA: Diagnosis not present

## 2018-03-03 DIAGNOSIS — R1310 Dysphagia, unspecified: Secondary | ICD-10-CM | POA: Diagnosis not present

## 2018-03-03 MED ORDER — OMEPRAZOLE 20 MG PO CPDR: 20.0000 mg | DELAYED_RELEASE_CAPSULE | Freq: Every day | ORAL | 0 refills | Status: DC

## 2018-03-03 NOTE — Patient Outreach (Signed)
[facility]) Care Management  03/03/2018  Lori Oliver 06-05-1971 [id number]   Telephone Screen Referral Date : 03/02/2018 Referral Source: [facility] ED Census Referral Reason: ED Utilization  Insurance: [place] attempt # 1 To patient.  Unsuccessful outreach attempt to the patient. No answer. HIPAA compliant voice mail left with contact information.  Plan: RN Health Coach will make an outreach attempt to the patient in one business day.   Lazaro Arms RN, BSN, [place] Direct Dial:  [phone] Fax: [phone]

## 2018-03-03 NOTE — ED Provider Notes (Signed)
[facility] Emergency Department Provider Note ____________________________________________  Time seen: 2006  I have reviewed the triage vital signs and the nursing notes.  HISTORY  Chief Complaint  Knee Pain  HPI Lori Oliver is a 47 y.o. female presents to the ED accompanied by her husband, for evaluation of injury to left knee.  Patient describes a mechanical fall where she slipped down 3 steps this morning.  She describes landing with the left knee in a valgus position.  Since that time she has had medial joint pain.  She denies any other injury at this time.  She also denies any chronic ongoing knee pain.  Past Medical History:  Diagnosis Date  . Abnormal thyroid function test 05/16/2016  . Atrial fibrillation ([place])   . 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 Decreased libido 05/16/2016  . Hypertension   . Morbid obesity ([place]) 06/01/2015    Patient Active Problem List   Diagnosis Date Noted  . Intermittent atrial fibrillation ([place]) 02/19/2018  . Hypokalemia 02/10/2018  . Anxiety about health 01/13/2018  . Atrial fibrillation with RVR ([place]) 12/01/2017  . Vitamin D deficiency 07/08/2017  . Lower back pain 07/07/2017  . Screen for colon cancer 07/07/2017  . Morbid obesity ([place]) 07/07/2017  . Hypertension 07/11/2016  . Preventative health care 05/16/2016  . IFG (impaired fasting glucose) 05/16/2016  . 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 Abnormal thyroid function test 05/16/2016  . Decreased libido 05/16/2016  . Breast cancer screening 05/16/2016  . Breast lump on left side at 5 o'clock position 05/16/2016  . Contraception management 05/16/2016  . Surveillance for birth control, oral contraceptives 02/12/2016  . Bilateral thoracic back pain 06/01/2015  . Blood pressure elevated without history of HTN 06/01/2015  . History of breast lump 06/01/2015  . History of abnormal cervical Pap smear 06/01/2015    Past Surgical History:  Procedure  Laterality Date  . BREAST BIOPSY Right 2015   benign  . HAMMER TOE SURGERY      Prior to Admission medications   Medication Sig Start Date End Date Taking? Authorizing Provider  acetaminophen-codeine (TYLENOL #3) 300-30 MG tablet Take 1 tablet by mouth every 6 (six) hours as needed for moderate pain. 02/28/18   Asahel Risden, Dannielle Karvonen, PA-C  apixaban (ELIQUIS) 5 MG TABS tablet Take 1 tablet (5 mg total) by mouth 2 (two) times daily. 12/02/17   Demetrios Loll, MD  estradiol (ESTRACE) 1 MG tablet Take 1 tablet (1 mg total) by mouth daily. Take 1 pill daily for 2 weeks.  Then off for 2 weeks. 01/28/18 01/28/19  Rubie Maid, MD  hydrochlorothiazide (HYDRODIURIL) 25 MG tablet Take 1 tablet (25 mg total) by mouth daily. 01/23/18   Lavonia Drafts, MD  LORazepam (ATIVAN) 1 MG tablet Take 1 tablet (1 mg total) by mouth every 8 (eight) hours as needed for anxiety. 02/08/18   Paulette Blanch, MD  potassium chloride (KLOR-CON 10) 10 MEQ tablet Take 1 tablet (10 mEq total) by mouth daily. 02/10/18   Arnetha Courser, MD    Allergies Patient has no known allergies.  Family History  Problem Relation Age of Onset  . Hypertension Mother   . Heart disease Mother   . Arthritis Mother   . Depression Mother   . Stroke Maternal Grandmother     Social History Social History   Tobacco Use  . Smoking status: Never Smoker  . Smokeless tobacco: Never Used  Substance Use Topics  . Alcohol use:  No    Alcohol/week: 0.0 oz    Frequency: Never  . Drug use: No    Review of Systems  Constitutional: Negative for fever. Cardiovascular: Negative for chest pain. Respiratory: Negative for shortness of breath. Musculoskeletal: Negative for back pain.  Left knee pain as above. Skin: Negative for rash. Neurological: Negative for headaches, focal weakness or numbness. ____________________________________________  PHYSICAL EXAM:  VITAL SIGNS: ED Triage Vitals  Enc Vitals Group     BP 02/28/18 1743 (!) 145/75      Pulse Rate 02/28/18 1743 66     Resp 02/28/18 1743 20     Temp 02/28/18 1743 98.9 F (37.2 C)     Temp Source 02/28/18 1743 Oral     SpO2 02/28/18 1743 100 %     Weight 02/28/18 1746 252 lb (114.3 kg)     Height 02/28/18 1746 5\' 7"  (1.702 m)     Head Circumference --      Peak Flow --      Pain Score 02/28/18 1746 9     Pain Loc --      Pain Edu? --      Excl. in [place]? --     Constitutional: Alert and oriented. Well appearing and in no distress. Head: Normocephalic and atraumatic. Cardiovascular: Normal rate, regular rhythm. Normal distal pulses. Respiratory: Normal respiratory effort. No wheezes/rales/rhonchi. Musculoskeletal: Left knee without any obvious deformity, dislocation, or effusion.  Patient is mildly tender the palpation to the medial joint line in the medial aspect of the knee.  She is able to flex and extend without difficulty.  Patella tracks without laxity.  No ballottement is appreciated.  Patient does have some mild tenderness to the medial joint with valgus joint stress.  No popliteal space fullness is noted.  No calf or Achilles tenderness is noted.  Nontender with normal range of motion in all extremities.  Neurologic: Antalgic gait without ataxia. Normal speech and language. No gross focal neurologic deficits are appreciated. ____________________________________________   RADIOLOGY  Left Knee IMPRESSION: Mild joint space narrowing medially.  No fracture or joint effusion. ____________________________________________  PROCEDURES  Procedures Tylenol #3 PO ____________________________________________  INITIAL IMPRESSION / ASSESSMENT AND PLAN / ED COURSE  Patient to the ED for evaluation of injury sustained following mechanical fall.  She is presenting with left knee pain.  Patient with the overall normal exam of the left knee.  There is some medial joint tenderness consistent with a medial collateral ligament strain.  X-rays negative for any fracture  dislocation.  Patient is discharged with a small prescription for Tylenol with codeine to dose as needed for pain relief.  To rest with the knee and leg elevated and apply ice as necessary.  She will follow-up with orthopedics for ongoing symptom management. ____________________________________________  FINAL CLINICAL IMPRESSION(S) / ED DIAGNOSES  Final diagnoses:  Sprain of medial collateral ligament of left knee, initial encounter      Melvenia Needles, PA-C 03/03/18 0035    Orbie Pyo, MD 03/03/18 1114

## 2018-03-03 NOTE — Progress Notes (Signed)
Name: Lori Oliver   MRN: [id number]    DOB: 08-27-1971   Date:03/03/2018       Progress Note  Subjective  Chief Complaint  Chief Complaint  Patient presents with  . swallowing issues    Pt states that her food comes back up sometimes, also she is constantly swallowing. Pt states that it is like an involutary reflex that she is doing it more often and noticed this yesterday.     HPI  Pt presents with concern for issues swallowing - occasionally will regurgitate a few hours after eating.  Denies throat pain.  She notes that she is able to swallow, but has a "reflex" to swallow constantly.  She notes she was in ICU for Afib RVR and she has had a lot of increased anxiety since then.  Swallowing issue and regurgitation has been significantly improved today, and she thought about canceling her appointment, but wanted to come in to be evaluated anyway.  Most recent TSH was 02/08/18 and was WNL  Patient Active Problem List   Diagnosis Date Noted  . Intermittent atrial fibrillation ([place]) 02/19/2018  . Hypokalemia 02/10/2018  . Anxiety about health 01/13/2018  . Atrial fibrillation with RVR ([place]) 12/01/2017  . Vitamin D deficiency 07/08/2017  . Lower back pain 07/07/2017  . Screen for colon cancer 07/07/2017  . Morbid obesity ([place]) 07/07/2017  . Hypertension 07/11/2016  . Preventative health care 05/16/2016  . IFG (impaired fasting glucose) 05/16/2016  . 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 Abnormal thyroid function test 05/16/2016  . Decreased libido 05/16/2016  . Breast cancer screening 05/16/2016  . Breast lump on left side at 5 o'clock position 05/16/2016  . Contraception management 05/16/2016  . Surveillance for birth control, oral contraceptives 02/12/2016  . Bilateral thoracic back pain 06/01/2015  . Blood pressure elevated without history of HTN 06/01/2015  . History of breast lump 06/01/2015  . History of abnormal cervical Pap smear 06/01/2015    Social History    Tobacco Use  . Smoking status: Never Smoker  . Smokeless tobacco: Never Used  Substance Use Topics  . Alcohol use: No    Alcohol/week: 0.0 oz    Frequency: Never     Current Outpatient Medications:  .  acetaminophen-codeine (TYLENOL #3) 300-30 MG tablet, Take 1 tablet by mouth every 6 (six) hours as needed for moderate pain., Disp: 10 tablet, Rfl: 0 .  estradiol (ESTRACE) 1 MG tablet, Take 1 tablet (1 mg total) by mouth daily. Take 1 pill daily for 2 weeks.  Then off for 2 weeks., Disp: 30 tablet, Rfl: 1 .  hydrochlorothiazide (HYDRODIURIL) 25 MG tablet, Take 1 tablet (25 mg total) by mouth daily. (Patient taking differently: Take 12.5 mg by mouth daily. ), Disp: 30 tablet, Rfl: 0 .  LORazepam (ATIVAN) 1 MG tablet, Take 1 tablet (1 mg total) by mouth every 8 (eight) hours as needed for anxiety., Disp: 15 tablet, Rfl: 0 .  apixaban (ELIQUIS) 5 MG TABS tablet, Take 1 tablet (5 mg total) by mouth 2 (two) times daily. (Patient not taking: Reported on 03/03/2018), Disp: 60 tablet, Rfl: 0 .  potassium chloride (KLOR-CON 10) 10 MEQ tablet, Take 1 tablet (10 mEq total) by mouth daily. (Patient not taking: Reported on 03/03/2018), Disp: 30 tablet, Rfl: 0  No Known Allergies  ROS  Constitutional: Negative for fever or weight change.  Respiratory: Negative for cough and shortness of breath.   Cardiovascular: Negative for chest pain; has ongoing issues with  palpitations  Gastrointestinal: Negative for abdominal pain, no bowel changes.  Musculoskeletal: Negative for gait problem or joint swelling.  Skin: Negative for rash.  Neurological: Negative for dizziness or headache.  No other specific complaints in a complete review of systems (except as listed in HPI above).  Objective  Vitals:   03/03/18 1528  BP: 116/76  Pulse: 70  Resp: 14  Temp: 98.1 F (36.7 C)  TempSrc: Oral  SpO2: 93%  Weight: 257 lb 9.6 oz (116.8 kg)   Body mass index is 40.35 kg/m.  Nursing Note and Vital Signs  reviewed.  Physical Exam  Constitutional: Patient appears well-developed and well-nourished. Obese. No distress.  HEENT: head atraumatic, normocephalic, pupils equal and reactive to light, EOM's intact, neck supple without lymphadenopathy, no thyromegaly, oropharynx pink and moist without exudate Cardiovascular: Normal rate, regular rhythm, S1/S2 present.  No murmur or rub heard. No BLE edema. Pulmonary/Chest: Effort normal and breath sounds clear. No respiratory distress or retractions. Abdominal: Soft and non-tender, bowel sounds present x4 quadrants. Psychiatric: Patient has a normal mood and affect. behavior is normal. Judgment and thought content normal.  No results found for this or any previous visit (from the past 72 hour(s)).  Assessment & Plan  1. Swallowing problem - Ambulatory referral to ENT  2. Regurgitation of food - omeprazole (PRILOSEC) 20 MG capsule; Take 1 capsule (20 mg total) by mouth daily.  Dispense: 30 capsule; Refill: 0 - Ambulatory referral to ENT - Referral is placed with ENT, however patient may call back to cancel referral if symptoms cease/improve she will call back and cancel ENT referral and instead schedule a follow up with PCP.  - Return in about 1 month (around 04/03/2018) for Follow up with PCP Dr. Sanda Klein. -Red flags and when to present for emergency care or RTC including fever >101.68F, chest pain, shortness of breath, new/worsening/un-resolving symptoms, choking, vomiting, abdominal pain reviewed with patient at time of visit. Follow up and care instructions discussed and provided in AVS.

## 2018-03-04 ENCOUNTER — Other Ambulatory Visit: Payer: Self-pay

## 2018-03-04 NOTE — Patient Outreach (Signed)
[facility]) Care Management  03/04/2018  Lori Oliver 05-02-1971 [id number]   Telephone Screen Referral Date : 03/02/2018 Referral Source: ED [facility] Census Referral Reason: ED Utilizer Insurance: [place] attempt # 2 To patient.   Successful outreach attempt to the patient.  HIPAA verified Discussed and offered [facility] care management services with patient. Patient states that she does not need services at this time.   Plan: RN Health Coach will proceed with case closure.  RN Health Coach will notify care management assistant of case status.       Lazaro Arms RN, BSN, [place] Direct Dial:  [phone] Fax: [phone]

## 2018-03-13 ENCOUNTER — Ambulatory Visit (INDEPENDENT_AMBULATORY_CARE_PROVIDER_SITE_OTHER): Payer: Managed Care, Other (non HMO) | Admitting: Obstetrics and Gynecology

## 2018-03-13 ENCOUNTER — Encounter: Payer: Self-pay | Admitting: Obstetrics and Gynecology

## 2018-03-13 VITALS — BP 138/87 | HR 73 | Ht 67.0 in | Wt 249.3 lb

## 2018-03-13 DIAGNOSIS — N921 Excessive and frequent menstruation with irregular cycle: Secondary | ICD-10-CM

## 2018-03-13 DIAGNOSIS — Z975 Presence of (intrauterine) contraceptive device: Secondary | ICD-10-CM

## 2018-03-13 DIAGNOSIS — I10 Essential (primary) hypertension: Secondary | ICD-10-CM

## 2018-03-13 DIAGNOSIS — I4891 Unspecified atrial fibrillation: Secondary | ICD-10-CM | POA: Diagnosis not present

## 2018-03-13 DIAGNOSIS — Z978 Presence of other specified devices: Secondary | ICD-10-CM

## 2018-03-13 MED ORDER — MEDROXYPROGESTERONE ACETATE 150 MG/ML IM SUSP: 150.0000 mg | INTRAMUSCULAR | 3 refills | Status: DC

## 2018-03-13 NOTE — Progress Notes (Signed)
Pt is having a lot of spotting and want to remove nexplanon.

## 2018-03-13 NOTE — Progress Notes (Signed)
    GYNECOLOGY PROGRESS NOTE  Subjective:    Patient ID: Lori Oliver, female    DOB: 1971-04-03, 47 y.o.   MRN: [id number]  HPI  Patient is a 47 y.o. G1P1 female who presents for discussion of Nexplanon removal. Nexplanon was inserted in 2017.  Patient notes that since having it inserted she has head continuous bleeding, averaging approximately 15-20 days of light bleeding/spotting each month.  States that this is now putting a strain on her marriage due to issues with intimacy because of the bleeding.  She desires removal, and would like to discuss other options. She was seen for similar complaints in February and was prescribed Estradiol to help with the breakthrough bleeding, but notes that she did not take it as she was afraid.   Of note, patient does mention that she was hospitalized in December 2019 for palpitations and required an admission as she was noted to have developed A-fib.  States that since that time she has developed some anxiety.  Her anxiety, along with being prescribed several new medications for the newly diagnosed A-fib and changing of her blood pressure medications, she opted not to take the Estradiol and has decided that she just desires removal.  Is considering initiation of Depo Provera.   The following portions of the patient's history were reviewed and updated as appropriate: allergies, current medications, past family history, past medical history, past social history, past surgical history and problem list.  Review of Systems Pertinent items noted in HPI and remainder of comprehensive ROS otherwise negative.   Objective:   Blood pressure 138/87, pulse 73, height 5\' 7"  (1.702 m), weight 249 lb 4.8 oz (113.1 kg). General appearance: alert and no distress Remainder of exam deferred.    Assessment:   Breakthrough bleeding on Nexplanon A-fib HTN  Plan:   - Reviewed all forms of birth control options available based on patient's age and risk factors  including fertility period awareness methods; over the counter/barrier methods; hormonal contraceptive medication including progesterone-only pill, injection; hormonal and nonhormonal IUDs; permanent sterilization options including vasectomy and the various tubal sterilization modalities. Risks and benefits reviewed.  Questions were answered.  Information was given to patient to review. Patient deciding between Depo Provera and Mirena IUD.  She desires to think over options, will make an appointment next week once decision is made.  - Nexplanon removed today, see below.  Advised on back up method of contraception or abstinence until next week.    Nexplanon Removal Patient identified, informed consent performed, consent signed.   Appropriate time out taken. Nexplanon site identified.  Area prepped in usual sterile fashon. Two ml of 1% lidocaine was used to anesthetize the area at the distal end of the implant. A small stab incision was made right beside the implant on the distal portion.  The Nexplanon rod was grasped using hemostats and removed without difficulty.  There was minimal blood loss. There were no complications.  Steri-strips were applied over the small incision.  A pressure bandage was applied to reduce any bruising.  The patient tolerated the procedure well and was given post procedure instructions.    A total of 15 minutes were spent face-to-face with the patient during this encounter and over half of that time dealt with counseling and coordination of care.   Rubie Maid, MD [facility]

## 2018-03-15 NOTE — Progress Notes (Signed)
Cardiology Office Note  Date:  03/17/2018   ID:  Lori Oliver, DOB 04-08-1971, MRN [id number]  PCP:  Lori Courser, MD   Chief Complaint  Patient presents with  . other    Afib/rapid heart beat at bedtime. Meds reviewed verbally with pt.    HPI:  [name] is a 47 year old woman with past medical history of anxiety Atrial fib in 12/01/2017 after having taken  Sudafed and cough medicine. HTN Morbid obesity  Who presents by referral from Dr. Sanda Oliver for consultation of her paroxysmal atrial fibrillation  Recent emergency room visits as detailed below ER 02/28/2018 for fall, knee pain ER 02/11/2018 for arm pain ER 02/08/2018 for palpitations and shortness of breath. ER 2/8/209 with HTN ER 01/02/2018 for nausea ER 12/23/2017 for chest pain, atypical ER 12/21/2017 for SOB, was tearful ER 12/06/2017 for palplitations, , [facility] 12/01/2017 for atrial fib with RVR, seen by dr. Humphrey Oliver Heart rate was 190 bpm, developed after taking cold medication Started on sotolol, Noac As an outpatient had fatigue, did not feel comfortable taking anticoagulation  Sleep test results reviewed, was told she had mild sleep apnea She declined CPAP Denies significant snoring at nighttime  Stress test through [facility]  Periodically with palpitations, nervous about her heart rhythm Monitor his heart rate with a watch Works 2 jobs Concerned about her weight, trying to watch her diet  She reports blood pressure has been relatively well controlled at home typically in the 120 range  EKG personally reviewed by myself on todays visit Shows normal sinus rhythm with rate 82 bpm nonspecific T wave abnormality Previous EKGs reviewed with no significant T wave abnormality   PMH:   has a past medical history of Abnormal thyroid function test (05/16/2016), Atrial fibrillation ([place]), 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 (05/16/2016), Decreased libido (05/16/2016), Hypertension, and Morbid obesity ([place])  (06/01/2015).  PSH:    Past Surgical History:  Procedure Laterality Date  . BREAST BIOPSY Right 2015   benign  . HAMMER TOE SURGERY      Current Outpatient Medications  Medication Sig Dispense Refill  . hydrochlorothiazide (MICROZIDE) 12.5 MG capsule Take 12.5 mg by mouth daily.    Marland Kitchen LORazepam (ATIVAN) 1 MG tablet Take 1 tablet (1 mg total) by mouth every 8 (eight) hours as needed for anxiety. 15 tablet 0  . medroxyPROGESTERone (DEPO-PROVERA) 150 MG/ML injection Inject 1 mL (150 mg total) into the muscle every 3 (three) months. 1 mL 3  . propranolol (INDERAL) 20 MG tablet Take 1 tablet (20 mg total) by mouth 3 (three) times daily as needed. 90 tablet 3   No current facility-administered medications for this visit.      Allergies:   Patient has no known allergies.   Social History:  The patient  reports that she has never smoked. She has never used smokeless tobacco. She reports that she does not drink alcohol or use drugs.   Family History:   family history includes Arthritis in her mother; Depression in her mother; Heart disease in her mother; Hypertension in her mother; Stroke in her maternal grandmother.    Review of Systems: Review of Systems  Constitutional: Negative.   Respiratory: Negative.   Cardiovascular: Positive for palpitations.  Gastrointestinal: Negative.   Musculoskeletal: Negative.   Neurological: Negative.   Psychiatric/Behavioral: Negative.   All other systems reviewed and are negative.    PHYSICAL EXAM: VS:  BP 138/87 (BP Location: Right Arm, Patient Position: Sitting, Cuff Size: Large)   Pulse 82  Ht 5\' 6"  (1.676 m)   Wt 251 lb (113.9 kg)   BMI 40.51 kg/m  , BMI Body mass index is 40.51 kg/m. GEN: Well nourished, well developed, in no acute distress  HEENT: normal  Neck: no JVD, carotid bruits, or masses Cardiac: RRR; no murmurs, rubs, or gallops,no edema  Respiratory:  clear to auscultation bilaterally, normal work of breathing GI: soft,  nontender, nondistended, + BS MS: no deformity or atrophy  Skin: warm and dry, no rash Neuro:  Strength and sensation are intact Psych: euthymic mood, full affect    Recent Labs: 12/01/2017: B Natriuretic Peptide 26.0 12/02/2017: Magnesium 2.2 02/07/2018: ALT 15 02/08/2018: TSH 2.963 02/11/2018: BUN 14; Creatinine, Ser 0.93; Hemoglobin 12.5; Platelets 261; Potassium 3.6; Sodium 137    Lipid Panel Lab Results  Component Value Date   CHOL 177 07/07/2017   HDL 49 07/07/2017   LDLCALC 101 (H) 07/07/2017   TRIG 134 07/07/2017      Wt Readings from Last 3 Encounters:  03/17/18 251 lb (113.9 kg)  03/13/18 249 lb 4.8 oz (113.1 kg)  03/03/18 257 lb 9.6 oz (116.8 kg)       ASSESSMENT AND PLAN:  Atrial fibrillation with RVR (HCC) - Plan: EKG 12-Lead Long discussion with her concerning paroxysmal atrial fibrillation in the setting of cold medication ChadsVAsc Is very low and we have recommended she does not need to restart anticoagulation or sotalol.  Sleep study is also not particularly impressive, she prefers not to wear CPAP at this time which I think is fair Further discussion of what to do if she has recurrent palpitations, we have prescribed propranolol for her to take as needed  Essential hypertension Uncertain if she needs HCTZ Pressure has been running 606 systolic at home She will monitor pressure and if this continues to run low she could try HCTZ every other day Potentially might be able to wean off HCTZ We did discuss if blood pressure does run high we could potentially change her to low side effect profile beta-blocker bystolic,   Morbid obesity ([place]) We have encouraged continued exercise, careful diet management in an effort to lose weight.  Anxiety about health Long discussion with her, Traumatized after atrial fibrillation spell with numerous ER visits Tried to provide reassurance Reports she is meeting with a counselor  Disposition:   F/U as needed    Total encounter time more than 60 minutes  Greater than 50% was spent in counseling and coordination of care with the patient  Patient was seen in consultation for Dr. Sanda Oliver and will be referred back to her office for ongoing care of the issues detailed above    Orders Placed This Encounter  Procedures  . EKG 12-Lead     Signed, Esmond Plants, M.D., Ph.D. 03/17/2018  [place], [place]

## 2018-03-17 ENCOUNTER — Encounter: Payer: Self-pay | Admitting: Cardiovascular Disease

## 2018-03-17 ENCOUNTER — Ambulatory Visit (INDEPENDENT_AMBULATORY_CARE_PROVIDER_SITE_OTHER): Payer: Managed Care, Other (non HMO) | Admitting: Cardiovascular Disease

## 2018-03-17 ENCOUNTER — Other Ambulatory Visit: Payer: Self-pay | Admitting: Orthopedic Surgery

## 2018-03-17 VITALS — BP 138/87 | HR 82 | Ht 66.0 in | Wt 251.0 lb

## 2018-03-17 DIAGNOSIS — M25562 Pain in left knee: Secondary | ICD-10-CM

## 2018-03-17 DIAGNOSIS — F418 Other specified anxiety disorders: Secondary | ICD-10-CM

## 2018-03-17 DIAGNOSIS — I10 Essential (primary) hypertension: Secondary | ICD-10-CM

## 2018-03-17 DIAGNOSIS — I4891 Unspecified atrial fibrillation: Secondary | ICD-10-CM

## 2018-03-17 MED ORDER — PROPRANOLOL HCL 20 MG PO TABS: 20.0000 mg | ORAL_TABLET | Freq: Three times a day (TID) | ORAL | 3 refills | Status: DC | PRN

## 2018-03-17 NOTE — Patient Instructions (Signed)
Medication Instructions:   Please take propranolol one pill as needed every 4 to 6 hrs   for fast heart rate, palpitations, irregular, atrial fib   Labwork:  No new labs needed  Testing/Procedures:  No further testing at this time   Follow-Up: It was a pleasure seeing you in the office today. Please call us if you have new issues that need to be addressed before your next appt.  [phone]  Your physician wants you to follow-up in:  As needed  If you need a refill on your cardiac medications before your next appointment, please call your pharmacy.  For educational health videos Log in to : [email or web address] Or : [email or web address], password : [email or web address]

## 2018-03-18 ENCOUNTER — Ambulatory Visit (INDEPENDENT_AMBULATORY_CARE_PROVIDER_SITE_OTHER): Payer: Managed Care, Other (non HMO) | Admitting: Obstetrics and Gynecology

## 2018-03-18 ENCOUNTER — Encounter: Payer: Self-pay | Admitting: Obstetrics and Gynecology

## 2018-03-18 VITALS — BP 127/87 | HR 75 | Wt 254.5 lb

## 2018-03-18 DIAGNOSIS — Z3042 Encounter for surveillance of injectable contraceptive: Secondary | ICD-10-CM

## 2018-03-18 MED ORDER — MEDROXYPROGESTERONE ACETATE 150 MG/ML IM SUSP
150.0000 mg | Freq: Once | INTRAMUSCULAR | Status: AC
  Administered 2018-03-18: 150 mg via INTRAMUSCULAR

## 2018-03-18 NOTE — Progress Notes (Signed)
Date last pap:    Last Depo-Provera: first shot Side Effects if any: na. Serum HCG indicated? na. Depo-Provera 150 mg IM given by: FH. Next appointment due June 19-July 13.  pt had nexplanon Friday March 12, 2018 no intercourse since removal.   BP 127/87   Pulse 75   Wt 254 lb 8 oz (115.4 kg)   BMI 41.08 kg/m

## 2018-03-21 ENCOUNTER — Encounter: Payer: Self-pay | Admitting: Obstetrics and Gynecology

## 2018-03-21 NOTE — Progress Notes (Signed)
I have reviewed the record and concur with patient management and plan.  Radley Teston, MD [facility]     

## 2018-03-25 ENCOUNTER — Ambulatory Visit
Admission: RE | Admit: 2018-03-25 | Discharge: 2018-03-25 | Disposition: A | Discharge status: Home / Self Care | Payer: Managed Care, Other (non HMO) | Source: Ambulatory Visit | Attending: Orthopedic Surgery | Admitting: Orthopedic Surgery

## 2018-03-25 DIAGNOSIS — M25562 Pain in left knee: Secondary | ICD-10-CM | POA: Diagnosis not present

## 2018-03-25 DIAGNOSIS — X58XXXA Exposure to other specified factors, initial encounter: Secondary | ICD-10-CM | POA: Diagnosis not present

## 2018-03-25 DIAGNOSIS — S83412A Sprain of medial collateral ligament of left knee, initial encounter: Secondary | ICD-10-CM | POA: Insufficient documentation

## 2018-03-25 DIAGNOSIS — M65862 Other synovitis and tenosynovitis, left lower leg: Secondary | ICD-10-CM | POA: Insufficient documentation

## 2018-03-25 DIAGNOSIS — M25462 Effusion, left knee: Secondary | ICD-10-CM | POA: Diagnosis not present

## 2018-03-27 ENCOUNTER — Telehealth: Payer: Self-pay | Admitting: Family Medicine

## 2018-03-27 ENCOUNTER — Other Ambulatory Visit: Payer: Self-pay

## 2018-03-27 ENCOUNTER — Emergency Department
Admission: EM | Admit: 2018-03-27 | Discharge: 2018-03-28 | Disposition: A | Discharge status: Home / Self Care | Payer: Managed Care, Other (non HMO) | Attending: Student in an Organized Health Care Education/Training Program | Admitting: Student in an Organized Health Care Education/Training Program

## 2018-03-27 ENCOUNTER — Emergency Department: Payer: Managed Care, Other (non HMO)

## 2018-03-27 DIAGNOSIS — I10 Essential (primary) hypertension: Secondary | ICD-10-CM | POA: Insufficient documentation

## 2018-03-27 DIAGNOSIS — Z79899 Other long term (current) drug therapy: Secondary | ICD-10-CM | POA: Insufficient documentation

## 2018-03-27 DIAGNOSIS — F439 Reaction to severe stress, unspecified: Secondary | ICD-10-CM | POA: Insufficient documentation

## 2018-03-27 DIAGNOSIS — R079 Chest pain, unspecified: Secondary | ICD-10-CM

## 2018-03-27 LAB — URINALYSIS, COMPLETE (UACMP) WITH MICROSCOPIC
BILIRUBIN URINE: NEGATIVE
GLUCOSE, UA: NEGATIVE mg/dL
HGB URINE DIPSTICK: NEGATIVE
Ketones, ur: NEGATIVE mg/dL
NITRITE: NEGATIVE
Protein, ur: NEGATIVE mg/dL
SPECIFIC GRAVITY, URINE: 1.005 (ref 1.005–1.030)
pH: 5 (ref 5.0–8.0)

## 2018-03-27 LAB — POCT PREGNANCY, URINE: Preg Test, Ur: NEGATIVE

## 2018-03-27 NOTE — ED Notes (Signed)
Patient transported to X-ray 

## 2018-03-27 NOTE — ED Notes (Addendum)
Pt states around lunch she started feeling chest pain and it continued throughout the day. She states she took her beta blocker (inderal) that she has prn from her cardiologist around 10pm without any improvement. She states it doesn't feel like her A Fib

## 2018-03-27 NOTE — ED Triage Notes (Signed)
Pt arrives ambulatory to triage with c/o left sided chest pain. Pt denies other cardiac symptoms at this time. Pt is in NAD.

## 2018-03-27 NOTE — Telephone Encounter (Signed)
Copied from [place]. Topic: Quick Communication - Rx Refill/Question >> Mar 27, 2018  2:44 PM Scherrie Gerlach wrote: Medication:  diflucan Pt states she is having a discharge and knows she has a yeast infection.  Only thing different is a change in her BC pills. Declined to make an appt for just a "yeast infection"   [facility] [address], [place] - [place] [phone] (Phone) [phone] (Fax)

## 2018-03-27 NOTE — ED Provider Notes (Signed)
[facility] Emergency Department Provider Note    First MD Initiated Contact with Patient 03/27/18 2327     (approximate)  I have reviewed the triage vital signs and the nursing notes.   HISTORY  Chief Complaint Chest Pain    HPI Lori KIDA DIGIULIO is a 47 y.o. female presents with chest pain.  States it is over the left chest wall does not hurt with taking a deep breath.  Says it occurred right around lunch and has been persistent.  No improvement with rest or worsening with exertion.  No positional component.  No recent heavy lifting.  States she does have a history of stress and has prescription for propranolol as well as Xanax.  She took her propranolol and has had some improvement in her symptoms.  This was prescribed by her cardiologist and states she has had recent negative stress test in the past 6 months.  Denies any nausea vomiting or abdominal pain.  No diaphoresis.  Past Medical History:  Diagnosis Date  . Abnormal thyroid function test 05/16/2016  . Atrial fibrillation ([place])   . 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 Decreased libido 05/16/2016  . Hypertension   . Morbid obesity ([place]) 06/01/2015   Family History  Problem Relation Age of Onset  . Hypertension Mother   . Heart disease Mother   . Arthritis Mother   . Depression Mother   . Stroke Maternal Grandmother    Past Surgical History:  Procedure Laterality Date  . BREAST BIOPSY Right 2015   benign  . HAMMER TOE SURGERY     Patient Active Problem List   Diagnosis Date Noted  . Intermittent atrial fibrillation ([place]) 02/19/2018  . Hypokalemia 02/10/2018  . Anxiety about health 01/13/2018  . Atrial fibrillation with RVR ([place]) 12/01/2017  . Vitamin D deficiency 07/08/2017  . Lower back pain 07/07/2017  . Screen for colon cancer 07/07/2017  . Morbid obesity ([place]) 07/07/2017  . Hypertension 07/11/2016  . Preventative health care 05/16/2016  . IFG (impaired fasting glucose)  05/16/2016  . 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 Abnormal thyroid function test 05/16/2016  . Decreased libido 05/16/2016  . Breast cancer screening 05/16/2016  . Breast lump on left side at 5 o'clock position 05/16/2016  . Contraception management 05/16/2016  . Surveillance for birth control, oral contraceptives 02/12/2016  . Bilateral thoracic back pain 06/01/2015  . Blood pressure elevated without history of HTN 06/01/2015  . History of breast lump 06/01/2015  . History of abnormal cervical Pap smear 06/01/2015      Prior to Admission medications   Medication Sig Start Date End Date Taking? Authorizing Provider  hydrochlorothiazide (MICROZIDE) 12.5 MG capsule Take 12.5 mg by mouth daily.    [provider]  LORazepam (ATIVAN) 1 MG tablet Take 1 tablet (1 mg total) by mouth every 8 (eight) hours as needed for anxiety. 02/08/18   Paulette Blanch, MD  medroxyPROGESTERone (DEPO-PROVERA) 150 MG/ML injection Inject 1 mL (150 mg total) into the muscle every 3 (three) months. 03/13/18   Rubie Maid, MD  propranolol (INDERAL) 20 MG tablet Take 1 tablet (20 mg total) by mouth 3 (three) times daily as needed. 03/17/18   Minna Merritts, MD    Allergies Patient has no known allergies.    Social History Social History   Tobacco Use  . Smoking status: Never Smoker  . Smokeless tobacco: Never Used  Substance Use Topics  . Alcohol use: No  Alcohol/week: 0.0 oz    Frequency: Never  . Drug use: No    Review of Systems Patient denies headaches, rhinorrhea, blurry vision, numbness, shortness of breath, chest pain, edema, cough, abdominal pain, nausea, vomiting, diarrhea, dysuria, fevers, rashes or hallucinations unless otherwise stated above in HPI. ____________________________________________   PHYSICAL EXAM:  VITAL SIGNS: Vitals:   03/27/18 2321 03/27/18 2336  BP: (!) 144/91   Pulse: 65   Resp: 20   Temp: 98.7 F (37.1 C)   SpO2: 100% 99%     Constitutional: Alert and oriented. Well appearing and in no acute distress. Eyes: Conjunctivae are normal.  Head: Atraumatic. Nose: No congestion/rhinnorhea. Mouth/Throat: Mucous membranes are moist.   Neck: No stridor. Painless ROM.  Cardiovascular: Normal rate, regular rhythm. Grossly normal heart sounds.  Good peripheral circulation. Respiratory: Normal respiratory effort.  No retractions. Lungs CTAB. Gastrointestinal: Soft and nontender. No distention. No abdominal bruits. No CVA tenderness. Genitourinary:  Musculoskeletal: No lower extremity tenderness nor edema.  No joint effusions. Neurologic:  Normal speech and language. No gross focal neurologic deficits are appreciated. No facial droop Skin:  Skin is warm, dry and intact. No rash noted. Psychiatric: Mood and affect are normal. Speech and behavior are normal.  ____________________________________________   LABS (all labs ordered are listed, but only abnormal results are displayed)  Results for orders placed or performed during the hospital encounter of 03/27/18 (from the past 24 hour(s))  Urinalysis, Complete w Microscopic     Status: Abnormal   Collection Time: 03/27/18 11:26 PM  Result Value Ref Range   Color, Urine STRAW (A) YELLOW   APPearance CLEAR (A) CLEAR   Specific Gravity, Urine 1.005 1.005 - 1.030   pH 5.0 5.0 - 8.0   Glucose, UA NEGATIVE NEGATIVE mg/dL   Hgb urine dipstick NEGATIVE NEGATIVE   Bilirubin Urine NEGATIVE NEGATIVE   Ketones, ur NEGATIVE NEGATIVE mg/dL   Protein, ur NEGATIVE NEGATIVE mg/dL   Nitrite NEGATIVE NEGATIVE   Leukocytes, UA SMALL (A) NEGATIVE   RBC / HPF 0-5 0 - 5 RBC/hpf   WBC, UA 0-5 0 - 5 WBC/hpf   Bacteria, UA RARE (A) NONE SEEN   Squamous Epithelial / LPF 0-5 (A) NONE SEEN   Mucus PRESENT   Pregnancy, urine POC     Status: None   Collection Time: 03/27/18 11:33 PM  Result Value Ref Range   Preg Test, Ur NEGATIVE NEGATIVE    ____________________________________________  EKG My review and personal interpretation at Time: 23:14   Indication: chest pain  Rate: 70  Rhythm: sinus Axis: normal Other:  Normal intervals, no stemi ____________________________________________  RADIOLOGY  I personally reviewed all radiographic images ordered to evaluate for the above acute complaints and reviewed radiology reports and findings.  These findings were personally discussed with the patient.  Please see medical record for radiology report.  ____________________________________________   PROCEDURES  Procedure(s) performed:  Procedures    Critical Care performed: no ____________________________________________   INITIAL IMPRESSION / ASSESSMENT AND PLAN / ED COURSE  Pertinent labs & imaging results that were available during my care of the patient were reviewed by me and considered in my medical decision making (see chart for details).  DDX: Asthma, copd, CHF, pna, ptx, malignancy, Pe, anemia   Makynzee K Jaskot is a 47 y.o. who presents to the ED with symptoms as described above.  Patient well-appearing in no acute distress.  Low risk by heart score and given over 10 hours of symptoms with negative troponin and  negative EKG with recent negative stress test do not feel this is representative of ACS.  Not clinically consistent with dissection.  She is low risk by Wells criteria therefore a d-dimer was sent to further risk stratify she is on birth control.  D-dimer is negative.  Her abdominal exam is soft and benign.  Patient was given Ativan with improvement in symptoms.  This suspect some component of muscular skeletal strain versus underlying stress.  Patient pain-free and at this point do believe she stable and appropriate for follow-up with cardiology and PCP.      As part of my medical decision making, I reviewed the following data within the [facility] notes reviewed and incorporated,  Labs reviewed, notes from prior ED visits and [place] Controlled Substance Database   ____________________________________________   FINAL CLINICAL IMPRESSION(S) / ED DIAGNOSES  Final diagnoses:  Chest pain, unspecified type  Stress      NEW MEDICATIONS STARTED DURING THIS VISIT:  New Prescriptions   No medications on file     Note:  This document was prepared using Dragon voice recognition software and may include unintentional dictation errors.    Merlyn Lot, MD 03/28/18 [phone]

## 2018-03-28 LAB — CBC
HCT: 34.6 % — ABNORMAL LOW (ref 35.0–47.0)
Hemoglobin: 12 g/dL (ref 12.0–16.0)
MCH: 33 pg (ref 26.0–34.0)
MCHC: 34.8 g/dL (ref 32.0–36.0)
MCV: 94.8 fL (ref 80.0–100.0)
PLATELETS: 266 10*3/uL (ref 150–440)
RBC: 3.65 MIL/uL — ABNORMAL LOW (ref 3.80–5.20)
RDW: 13.4 % (ref 11.5–14.5)
WBC: 9.6 10*3/uL (ref 3.6–11.0)

## 2018-03-28 LAB — BASIC METABOLIC PANEL
Anion gap: 4 — ABNORMAL LOW (ref 5–15)
BUN: 12 mg/dL (ref 6–20)
CHLORIDE: 108 mmol/L (ref 101–111)
CO2: 26 mmol/L (ref 22–32)
CREATININE: 0.87 mg/dL (ref 0.44–1.00)
Calcium: 8.7 mg/dL — ABNORMAL LOW (ref 8.9–10.3)
GFR calc Af Amer: >60 mL/min (ref >60)
GFR calc non Af Amer: >60 mL/min (ref >60)
GLUCOSE: 110 mg/dL — AB (ref 65–99)
Potassium: 3.4 mmol/L — ABNORMAL LOW (ref 3.5–5.1)
Sodium: 138 mmol/L (ref 135–145)

## 2018-03-28 LAB — TROPONIN I: Troponin I: <0.03 ng/mL (ref <0.03)

## 2018-03-28 LAB — FIBRIN DERIVATIVES D-DIMER (ARMC ONLY): Fibrin derivatives D-dimer (ARMC): 346.98 ng/mL (FEU) (ref 0.00–499.00)

## 2018-03-28 MED ORDER — LORAZEPAM 1 MG PO TABS
1.0000 mg | ORAL_TABLET | Freq: Once | ORAL | Status: AC
  Administered 2018-03-28: 1 mg via ORAL
  Filled 2018-03-28: qty 1

## 2018-04-01 ENCOUNTER — Telehealth: Payer: Self-pay | Admitting: Cardiovascular Disease

## 2018-04-01 NOTE — Telephone Encounter (Signed)
Pt calling stating she has a torn ligament and her orthopedic would like her to start taking ibuprofen  She is calling asking based on the medications we have her on  Would this be okay to take   Please call back

## 2018-04-01 NOTE — Telephone Encounter (Signed)
Spoke with patient regarding her medications and reviewed that there shouldn't be a problem with her taking the ibuprofen that is ordered. She reports that she has only had to take the propranolol a couple of times and does not take it that often. She was appreciative for the call back and had no further questions at this time.

## 2018-04-03 ENCOUNTER — Encounter: Payer: Self-pay | Admitting: Family Medicine

## 2018-04-03 ENCOUNTER — Ambulatory Visit (INDEPENDENT_AMBULATORY_CARE_PROVIDER_SITE_OTHER): Payer: Managed Care, Other (non HMO) | Admitting: Family Medicine

## 2018-04-03 DIAGNOSIS — R7301 Impaired fasting glucose: Secondary | ICD-10-CM

## 2018-04-03 DIAGNOSIS — I480 Paroxysmal atrial fibrillation: Secondary | ICD-10-CM | POA: Diagnosis not present

## 2018-04-03 MED ORDER — METFORMIN HCL 500 MG PO TABS: 500.0000 mg | ORAL_TABLET | Freq: Two times a day (BID) | ORAL | 2 refills | Status: DC

## 2018-04-03 NOTE — Patient Instructions (Addendum)
Continue the counseling Check out the information at familydoctor.org entitled "Nutrition for Weight Loss: What You Need to Know about Fad Diets" Try to lose between 1-2 pounds per week by taking in fewer calories and burning off more calories You can succeed by limiting portions, limiting foods dense in calories and fat, becoming more active, and drinking 8 glasses of water a day (64 ounces) Don't skip meals, especially breakfast, as skipping meals may alter your metabolism Do not use over-the-counter weight loss pills or gimmicks that claim rapid weight loss A healthy BMI (or body mass index) is between 18.5 and 24.9 You can calculate your ideal BMI at the [place] website ClubMonetize.fr Keep me posted and let me know how things are going  Preventing Unhealthy Weight Gain, Adult Staying at a healthy weight is important. When fat builds up in your body, you may become overweight or obese. These conditions put you at greater risk for developing certain health problems, such as heart disease, diabetes, sleeping problems, joint problems, and some cancers. Unhealthy weight gain is often the result of making unhealthy choices in what you eat. It is also a result of not getting enough exercise. You can make changes to your lifestyle to prevent obesity and stay as healthy as possible. What nutrition changes can be made? To maintain a healthy weight and prevent obesity:  Eat only as much as your body needs. To do this: ? Pay attention to signs that you are hungry or full. Stop eating as soon as you feel full. ? If you feel hungry, try drinking water first. Drink enough water so your urine is clear or pale yellow. ? Eat smaller portions. ? Look at serving sizes on food labels. Most foods contain more than one serving per container. ? Eat the recommended amount of calories for your gender and activity level. While most active people should eat around 2,000  calories per day, if you are trying to lose weight or are not very active, you main need to eat less calories. Talk to your health care provider or dietitian about how many calories you should eat each day.  Choose healthy foods, such as: ? Fruits and vegetables. Try to fill at least half of your plate at each meal with fruits and vegetables. ? Whole grains, such as whole wheat bread, brown rice, and quinoa. ? Lean meats, such as chicken or fish. ? Other healthy proteins, such as beans, eggs, or tofu. ? Healthy fats, such as nuts, seeds, fatty fish, and olive oil. ? Low-fat or fat-free dairy.  Check food labels and avoid food and drinks that: ? Are high in calories. ? Have added sugar. ? Are high in sodium. ? Have saturated fats or trans fats.  Limit how much you eat of the following foods: ? Prepackaged meals. ? Fast food. ? Fried foods. ? Processed meat, such as bacon, sausage, and deli meats. ? Fatty cuts of red meat and poultry with skin.  Cook foods in healthier ways, such as by baking, broiling, or grilling.  When grocery shopping, try to shop around the outside of the store. This helps you buy mostly fresh foods and avoid canned and prepackaged foods.  What lifestyle changes can be made?  Exercise at least 30 minutes 5 or more days each week. Exercising includes brisk walking, yard work, biking, running, swimming, and team sports like basketball and soccer. Ask your health care provider which exercises are safe for you.  Do not use any products that contain  nicotine or tobacco, such as cigarettes and e-cigarettes. If you need help quitting, ask your health care provider.  Limit alcohol intake to no more than 1 drink a day for nonpregnant women and 2 drinks a day for men. One drink equals 12 oz of beer, 5 oz of wine, or 1 oz of hard liquor.  Try to get 7-9 hours of sleep each night. What other changes can be made?  Keep a food and activity journal to keep track  of: ? What you ate and how many calories you had. Remember to count sauces, dressings, and side dishes. ? Whether you were active, and what exercises you did. ? Your calorie, weight, and activity goals.  Check your weight regularly. Track any changes. If you notice you have gained weight, make changes to your diet or activity routine.  Avoid taking weight-loss medicines or supplements. Talk to your health care provider before starting any new medicine or supplement.  Talk to your health care provider before trying any new diet or exercise plan. Why are these changes important? Eating healthy, staying active, and having healthy habits not only help prevent obesity, they also:  Help you to manage stress and emotions.  Help you to connect with friends and family.  Improve your self-esteem.  Improve your sleep.  Prevent long-term health problems.  What can happen if changes are not made? Being obese or overweight can cause you to develop joint or bone problems, which can make it hard for you to stay active or do activities you enjoy. Being obese or overweight also puts stress on your heart and lungs and can lead to health problems like diabetes, heart disease, and some cancers. Where to find more information: Talk with your health care provider or a dietitian about healthy eating and healthy lifestyle choices. You may also find other information through these resources:  U.S. Department of Agriculture MyPlate: FormerBoss.no  American Heart Association: www.heart.org  Centers for Disease Control and Prevention: http://www.wolf.info/  Summary  Staying at a healthy weight is important. It helps prevent certain diseases and health problems, such as heart disease, diabetes, joint problems, sleep disorders, and some cancers.  Being obese or overweight can cause you to develop joint or bone problems, which can make it hard for you to stay active or do activities you enjoy.  You can prevent  unhealthy weight gain by eating a healthy diet, exercising regularly, not smoking, limiting alcohol, and getting enough sleep.  Talk with your health care provider or a dietitian for guidance about healthy eating and healthy lifestyle choices. This information is not intended to replace advice given to you by your health care provider. Make sure you discuss any questions you have with your health care provider. Document Released: 12/03/2016 Document Revised: 01/08/2017 Document Reviewed: 01/08/2017 Elsevier Interactive Patient Education  [employer].

## 2018-04-03 NOTE — Progress Notes (Signed)
BP 124/84   Pulse 78   Temp 98.3 F (36.8 C) (Oral)   Resp 14   Ht 5\' 6"  (1.676 m)   Wt 251 lb (113.9 kg)   SpO2 93%   BMI 40.51 kg/m    Subjective:    Patient ID: Lori Oliver, female    DOB: 05/10/1971, 47 y.o.   MRN: [id number]  HPI: Lori Oliver is a 47 y.o. female  Chief Complaint  Patient presents with  . Follow-up    HPI Patient is here for f/u She is not feeling good at all She has good days and bad days She has not definitely been herself Now just getting used to having chest pain She have checked chest xrays Always the left side chest wall; just an ache Stopped the blood thinner Negative stress test in the last 6 months She is going to see the other cardiologist She has a bottle of HCTZ but is not taking it She took the propranolol yesterday with a bout of chest pain and it did ease it off She is not taking her eliquis On ibuprofen PRN for torn ligament per ortho, left knee; will take time to heal Mild sleep apnea and they recommended CPAP, but patient does not want to wear the mask; they told her to lose weight, but hard to lose weight if you can't exercise, left knee hurting Monitoring BP at home, 120s, and pulse with watch He discussed maybe using bystolic  Morbid obesity; talked about riding the stationary bike Insurance wouldn't cover Saxenda We reviewed the glucose readings in the ER, 100-132 (nonfasting) She is going to see the ENT, doing the swallowing thing a lot; medicine was making the mouth dry  Going to a counselor for her anxiety; she does feel like it's helpful They gave her Ativan 1 mg and she still has several of those pills leftover  Depression screen [facility] 2/9 04/03/2018 03/03/2018 01/13/2018 01/13/2018 12/15/2017  Decreased Interest 0 0 0 1 0  Down, Depressed, Hopeless 1 1 1 1 1   PHQ - 2 Score 1 1 1 2 1   Altered sleeping - 0 0 - -  Tired, decreased energy - 0 1 - -  Change in appetite - 1 1 - -  Feeling bad or failure about  yourself  - 1 0 - -  Trouble concentrating - 1 0 - -  Moving slowly or fidgety/restless - 0 0 - -  Suicidal thoughts - 0 0 - -  PHQ-9 Score - 4 3 - -  Difficult doing work/chores - Somewhat difficult Somewhat difficult - -    Relevant past medical, surgical, family and social history reviewed Past Medical History:  Diagnosis Date  . Abnormal thyroid function test 05/16/2016  . Atrial fibrillation ([place])   . 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 Decreased libido 05/16/2016  . Hypertension   . Morbid obesity ([place]) 06/01/2015   Past Surgical History:  Procedure Laterality Date  . BREAST BIOPSY Right 2015   benign  . HAMMER TOE SURGERY     Family History  Problem Relation Age of Onset  . Hypertension Mother   . Heart disease Mother   . Arthritis Mother   . Depression Mother   . Stroke Maternal Grandmother    Social History   Tobacco Use  . Smoking status: Never Smoker  . Smokeless tobacco: Never Used  Substance Use Topics  . Alcohol use: No    Alcohol/week: 0.0 oz  Frequency: Never  . Drug use: No    Interim medical history since last visit reviewed. Allergies and medications reviewed  Review of Systems Per HPI unless specifically indicated above     Objective:    BP 124/84   Pulse 78   Temp 98.3 F (36.8 C) (Oral)   Resp 14   Ht 5\' 6"  (1.676 m)   Wt 251 lb (113.9 kg)   SpO2 93%   BMI 40.51 kg/m   Wt Readings from Last 3 Encounters:  04/14/18 250 lb 8 oz (113.6 kg)  04/03/18 251 lb (113.9 kg)  03/27/18 254 lb (115.2 kg)    Physical Exam  Constitutional: She appears well-developed and well-nourished.  Morbidly obese  HENT:  Mouth/Throat: Mucous membranes are normal.  Eyes: EOM are normal. No scleral icterus.  Cardiovascular: Normal rate and regular rhythm.  Pulmonary/Chest: Effort normal and breath sounds normal.  Psychiatric: She has a normal mood and affect. Her behavior is normal.    Results for orders placed or performed during the hospital  encounter of 10/09/84  Basic metabolic panel  Result Value Ref Range   Sodium 138 135 - 145 mmol/L   Potassium 3.4 (L) 3.5 - 5.1 mmol/L   Chloride 108 101 - 111 mmol/L   CO2 26 22 - 32 mmol/L   Glucose, Bld 110 (H) 65 - 99 mg/dL   BUN 12 6 - 20 mg/dL   Creatinine, Ser 0.87 0.44 - 1.00 mg/dL   Calcium 8.7 (L) 8.9 - 10.3 mg/dL   GFR calc non Af Amer >60 >60 mL/min   GFR calc Af Amer >60 >60 mL/min   Anion gap 4 (L) 5 - 15  CBC  Result Value Ref Range   WBC 9.6 3.6 - 11.0 K/uL   RBC 3.65 (L) 3.80 - 5.20 MIL/uL   Hemoglobin 12.0 12.0 - 16.0 g/dL   HCT 34.6 (L) 35.0 - 47.0 %   MCV 94.8 80.0 - 100.0 fL   MCH 33.0 26.0 - 34.0 pg   MCHC 34.8 32.0 - 36.0 g/dL   RDW 13.4 11.5 - 14.5 %   Platelets 266 150 - 440 K/uL  Troponin I  Result Value Ref Range   Troponin I <0.03 <0.03 ng/mL  Urinalysis, Complete w Microscopic  Result Value Ref Range   Color, Urine STRAW (A) YELLOW   APPearance CLEAR (A) CLEAR   Specific Gravity, Urine 1.005 1.005 - 1.030   pH 5.0 5.0 - 8.0   Glucose, UA NEGATIVE NEGATIVE mg/dL   Hgb urine dipstick NEGATIVE NEGATIVE   Bilirubin Urine NEGATIVE NEGATIVE   Ketones, ur NEGATIVE NEGATIVE mg/dL   Protein, ur NEGATIVE NEGATIVE mg/dL   Nitrite NEGATIVE NEGATIVE   Leukocytes, UA SMALL (A) NEGATIVE   RBC / HPF 0-5 0 - 5 RBC/hpf   WBC, UA 0-5 0 - 5 WBC/hpf   Bacteria, UA RARE (A) NONE SEEN   Squamous Epithelial / LPF 0-5 (A) NONE SEEN   Mucus PRESENT   Fibrin derivatives D-Dimer ([facility] only)  Result Value Ref Range   Fibrin derivatives D-dimer ([facility]) 346.98 0.00 - 499.00 ng/mL (FEU)  Pregnancy, urine POC  Result Value Ref Range   Preg Test, Ur NEGATIVE NEGATIVE      Assessment & Plan:   Problem List Items Addressed This Visit      Cardiovascular and Mediastinum   Intermittent atrial fibrillation ([place])    Seen now by two cardiologists; I believe patient went into atrial fibrillation because of excess cold  medicines containing decongestants; no further  documented episodes; discussed notation of most recent cardiologist's visit        Endocrine   IFG (impaired fasting glucose)    Start metformin, which should also help with weight loss; significant weight loss needed to slow progression to outright type 2 DM        Other   Morbid obesity ([place])    Will need to avoid any sort of stimulant diet medicine; will start metformin; encouragement, supportive listening      Relevant Medications   metFORMIN (GLUCOPHAGE) 500 MG tablet      Follow up plan: Return in about 3 months (around 07/03/2018) for follow-up visit with Dr. Sanda Klein.  An after-visit summary was printed and given to the patient at [employer].  Please see the patient instructions which may contain other information and recommendations beyond what is mentioned above in the assessment and plan.  Meds ordered this encounter  Medications  . metFORMIN (GLUCOPHAGE) 500 MG tablet    Sig: Take 1 tablet (500 mg total) by mouth 2 (two) times daily with a meal. Best if taken 15-30 minutes prior to eating    Dispense:  60 tablet    Refill:  2   Face-to-face time with patient was more than 25 minutes, >50% time spent counseling and coordination of care

## 2018-04-09 ENCOUNTER — Telehealth: Payer: Self-pay | Admitting: Cardiovascular Disease

## 2018-04-09 NOTE — Telephone Encounter (Signed)
Called patient. She's been having chest pain in her left upper breast area. Some days she doesn't have it and some days she does. Ibuprofen and tylenol do not help. She has propranolol that [name] prescribes to take on as needed basis. She finds this helps the pain but she's not sure if she should be taking it for that. She has the propranolol for palpitations but she denies having palpitations lately. Denies shortness of breath, dizziness, swelling, nausea, vomiting or sweating. Blood pressure has been stable around 120/60. SHe does not feel it is related to anxiety. She went to ED on 03/27/18 and tests were negative and she was told to f/u with PCP and cardiologist. Scheduled patient to see [name] on 04/14/18. Pt verbalized understanding to call 911 or go to the emergency room, if he develops any new or worsening symptoms.

## 2018-04-09 NOTE — Telephone Encounter (Signed)
Please wait til after 2:30p to call Will be at work til then

## 2018-04-09 NOTE — Telephone Encounter (Signed)
Patient returning call.

## 2018-04-09 NOTE — Telephone Encounter (Signed)
No answer. Left message to call back.   

## 2018-04-09 NOTE — Telephone Encounter (Signed)
Patient calling to say that she has been having an aching pain on her left side She is wondering if it could possible be the medication that she has only been taking as needed Please call to discuss

## 2018-04-12 NOTE — Progress Notes (Signed)
Cardiology Office Note  Date:  04/14/2018   ID:  LILU MCGLOWN, DOB 03-05-1971, MRN [id number]  PCP:  Arnetha Courser, MD   Chief Complaint  Patient presents with  . OTHER    C/o Left upper chest pain and pt worried about heart rate when pt is active. Meds reviewed verbally with pt.    HPI:  Lori Oliver is a 47 year old woman with past medical history of anxiety Atrial fib in 12/01/2017 after having taken  Sudafed and cough medicine. HTN Morbid obesity  Who presents for follow-up of her paroxysmal atrial fibrillation, atypical chest pain  Recent emergency room visits as detailed below ER March 27, 2018 for chest pain atypical ER 02/28/2018 for fall, knee pain ER 02/11/2018 for arm pain ER 02/08/2018 for palpitations and shortness of breath. ER 2/8/209 with HTN ER 01/02/2018 for nausea ER 12/23/2017 for chest pain, atypical ER 12/21/2017 for SOB, was tearful ER 12/06/2017 for palplitations, , [facility] 12/01/2017 for atrial fib with RVR, seen by dr. Humphrey Rolls Heart rate was 190 bpm, developed after taking cold medication Started on sotolol, Noac As an outpatient had fatigue, did not feel comfortable taking anticoagulation  Sleep test  mild sleep apnea She declined CPAP Stress test through Humphrey Rolls  In follow-up today she has been having some left side chest pain No rhyme or reason, comes on at rest, rubs it on the left side when she has symptoms Does heavy lifting at work, works in hospice No cardiac risk factors Numerous work-up in the past for similar symptoms, all negative  CT scan chest reviewed with her from 2018 showing no coronary calcification or aortic atherosclerosis  EKG personally reviewed by myself on todays visit Shows normal sinus rhythm with rate 70 bpm no significant ST or T wave changes  PMH:   has a past medical history of Abnormal thyroid function test (05/16/2016), Atrial fibrillation ([place]), 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 (05/16/2016), Decreased libido  (05/16/2016), Hypertension, and Morbid obesity ([place]) (06/01/2015).  PSH:    Past Surgical History:  Procedure Laterality Date  . BREAST BIOPSY Right 2015   benign  . HAMMER TOE SURGERY      Current Outpatient Medications  Medication Sig Dispense Refill  . hydrochlorothiazide (MICROZIDE) 12.5 MG capsule Take 12.5 mg by mouth as needed.     Marland Kitchen LORazepam (ATIVAN) 1 MG tablet Take 1 tablet (1 mg total) by mouth every 8 (eight) hours as needed for anxiety. 15 tablet 0  . medroxyPROGESTERone (DEPO-PROVERA) 150 MG/ML injection Inject 1 mL (150 mg total) into the muscle every 3 (three) months. 1 mL 3  . metFORMIN (GLUCOPHAGE) 500 MG tablet Take 1 tablet (500 mg total) by mouth 2 (two) times daily with a meal. Best if taken 15-30 minutes prior to eating 60 tablet 2  . propranolol (INDERAL) 20 MG tablet Take 1 tablet (20 mg total) by mouth 3 (three) times daily as needed. 90 tablet 3   No current facility-administered medications for this visit.      Allergies:   Patient has no known allergies.   Social History:  The patient  reports that she has never smoked. She has never used smokeless tobacco. She reports that she does not drink alcohol or use drugs.   Family History:   family history includes Arthritis in her mother; Depression in her mother; Heart disease in her mother; Hypertension in her mother; Stroke in her maternal grandmother.    Review of Systems: Review of Systems  Constitutional: Negative.  Respiratory: Negative.   Cardiovascular: Positive for chest pain.  Gastrointestinal: Negative.   Musculoskeletal: Negative.   Neurological: Negative.   Psychiatric/Behavioral: Negative.   All other systems reviewed and are negative.    PHYSICAL EXAM: VS:  BP 128/80 (BP Location: Left Arm, Patient Position: Sitting, Cuff Size: Large)   Pulse 70   Ht 5\' 6"  (1.676 m)   Wt 250 lb 8 oz (113.6 kg)   BMI 40.43 kg/m  , BMI Body mass index is 40.43 kg/m. Constitutional:  oriented to  person, place, and time. No distress.  HENT:  Head: Normocephalic and atraumatic.  Eyes:  no discharge. No scleral icterus.  Neck: Normal range of motion. Neck supple. No JVD present.  Cardiovascular: Normal rate, regular rhythm, normal heart sounds and intact distal pulses. Exam reveals no gallop and no friction rub. No edema No murmur heard. Pulmonary/Chest: Effort normal and breath sounds normal. No stridor. No respiratory distress.  no wheezes.  no rales.  no tenderness.  Abdominal: Soft.  no distension.  no tenderness.  Musculoskeletal: Normal range of motion.  no  tenderness or deformity.  Neurological:  normal muscle tone. Coordination normal. No atrophy Skin: Skin is warm and dry. No rash noted. not diaphoretic.  Psychiatric:  normal mood and affect. behavior is normal. Thought content normal.    Recent Labs: 12/01/2017: B Natriuretic Peptide 26.0 12/02/2017: Magnesium 2.2 02/07/2018: ALT 15 02/08/2018: TSH 2.963 03/27/2018: BUN 12; Creatinine, Ser 0.87; Hemoglobin 12.0; Platelets 266; Potassium 3.4; Sodium 138    Lipid Panel Lab Results  Component Value Date   CHOL 177 07/07/2017   HDL 49 07/07/2017   LDLCALC 101 (H) 07/07/2017   TRIG 134 07/07/2017      Wt Readings from Last 3 Encounters:  04/14/18 250 lb 8 oz (113.6 kg)  04/03/18 251 lb (113.9 kg)  03/27/18 254 lb (115.2 kg)       ASSESSMENT AND PLAN:  Atrial fibrillation with RVR ([facility]) - Plan: EKG 12-Lead [place] atrial fib December 2018 no recurrent episodes No further work-up at this time  Chest pain Long discussion with her, atypical in nature The risk factors, CT scan chest reviewed with no coronary calcifications Previous echocardiogram stress test all normal Suspect muscular ligamental possibly exacerbated by work Recommended NSAIDs, hot pack Reassurance provided  Essential hypertension Blood pressure stable, for some reason has been taking propranolol for her left side chest pain This was  prescribed for palpitations No changes to her medications made  Morbid obesity ([place]) We have encouraged continued exercise, careful diet management in an effort to lose weight. Recommended low carbohydrate diet  Anxiety about health Would follow-up with counselor, unable to exclude work stress May need SSRI Frequent visits to the emergency room  Disposition:   F/U as needed   Total encounter time more than 25 minutes  Greater than 50% was spent in counseling and coordination of care with the patient    Orders Placed This Encounter  Procedures  . EKG 12-Lead     Signed, Esmond Plants, M.D., Ph.D. 04/14/2018  [place], [place]

## 2018-04-14 ENCOUNTER — Encounter: Payer: Self-pay | Admitting: Cardiovascular Disease

## 2018-04-14 ENCOUNTER — Ambulatory Visit (INDEPENDENT_AMBULATORY_CARE_PROVIDER_SITE_OTHER): Payer: Managed Care, Other (non HMO) | Admitting: Cardiovascular Disease

## 2018-04-14 ENCOUNTER — Encounter: Payer: Self-pay | Admitting: *Deleted

## 2018-04-14 VITALS — BP 128/80 | HR 70 | Ht 66.0 in | Wt 250.5 lb

## 2018-04-14 DIAGNOSIS — I10 Essential (primary) hypertension: Secondary | ICD-10-CM | POA: Diagnosis not present

## 2018-04-14 DIAGNOSIS — F418 Other specified anxiety disorders: Secondary | ICD-10-CM

## 2018-04-14 DIAGNOSIS — I4891 Unspecified atrial fibrillation: Secondary | ICD-10-CM | POA: Diagnosis not present

## 2018-04-14 NOTE — Patient Instructions (Addendum)
Acid blocker pills As needed pills: pepcid/zantac as needed up to twice a day Daily pill: omeprazole 20 mg daily    Medication Instructions:   No medication changes made  Labwork:  No new labs needed  Testing/Procedures:  No further testing at this time   Follow-Up: It was a pleasure seeing you in the office today. Please call us if you have new issues that need to be addressed before your next appt.  [phone]  Your physician wants you to follow-up in:  as needed.   If you need a refill on your cardiac medications before your next appointment, please call your pharmacy.  For educational health videos Log in to : [email or web address] Or : [email or web address], password : [email or web address]

## 2018-04-19 NOTE — Assessment & Plan Note (Signed)
Start metformin, which should also help with weight loss; significant weight loss needed to slow progression to outright type 2 DM

## 2018-04-19 NOTE — Assessment & Plan Note (Signed)
Will need to avoid any sort of stimulant diet medicine; will start metformin; encouragement, supportive listening

## 2018-04-19 NOTE — Assessment & Plan Note (Signed)
Seen now by two cardiologists; I believe patient went into atrial fibrillation because of excess cold medicines containing decongestants; no further documented episodes; discussed notation of most recent cardiologist's visit

## 2018-05-04 ENCOUNTER — Telehealth: Payer: Self-pay | Admitting: Family Medicine

## 2018-05-04 DIAGNOSIS — N644 Mastodynia: Secondary | ICD-10-CM

## 2018-05-04 NOTE — Telephone Encounter (Signed)
Left detailed voicemail

## 2018-05-04 NOTE — Telephone Encounter (Signed)
For the metformin, don't take any more if making her nauseated List as adverse symptom please; offer referral to diabetic educator for impaired fasting glucose/prediabetes; weight loss is our key  I think her breast pain may be due to musculoskeletal issues, but let's get a LEFT diagnostic mammogram and [place] (if needed) to be thorough; please ORDER and let her know how to schedule those; diagnosis is LEFT breast pain

## 2018-05-04 NOTE — Telephone Encounter (Signed)
Copied from [place] [id number]. Topic: Quick Communication - See Telephone Encounter >> May 04, 2018 11:28 AM Arletha Grippe wrote: CRM for notification. See Telephone encounter for: 05/04/18.  Pt called - she is having nausea when she takes. metFORMIN (GLUCOPHAGE) 500 MG tablet.  She stopped taking it a couple of weeks ago and wasn't to know what to do next.  Also, pt is having pain in left breast, feel like it is getting worse.. She said she talked about it at last visit.  Cb is [phone]

## 2018-05-14 ENCOUNTER — Ambulatory Visit
Admission: RE | Admit: 2018-05-14 | Discharge: 2018-05-14 | Disposition: A | Discharge status: Home / Self Care | Payer: Managed Care, Other (non HMO) | Source: Ambulatory Visit | Attending: Family Medicine | Admitting: Family Medicine

## 2018-05-14 DIAGNOSIS — N644 Mastodynia: Secondary | ICD-10-CM

## 2018-06-04 ENCOUNTER — Encounter: Payer: Self-pay | Admitting: Intensive Care

## 2018-06-04 ENCOUNTER — Emergency Department
Admission: EM | Admit: 2018-06-04 | Discharge: 2018-06-04 | Disposition: A | Discharge status: Home / Self Care | Payer: Managed Care, Other (non HMO) | Attending: Student in an Organized Health Care Education/Training Program | Admitting: Student in an Organized Health Care Education/Training Program

## 2018-06-04 ENCOUNTER — Emergency Department: Payer: Managed Care, Other (non HMO)

## 2018-06-04 ENCOUNTER — Other Ambulatory Visit: Payer: Self-pay

## 2018-06-04 DIAGNOSIS — K2960 Other gastritis without bleeding: Secondary | ICD-10-CM | POA: Diagnosis not present

## 2018-06-04 DIAGNOSIS — Z79899 Other long term (current) drug therapy: Secondary | ICD-10-CM | POA: Diagnosis not present

## 2018-06-04 DIAGNOSIS — I10 Essential (primary) hypertension: Secondary | ICD-10-CM | POA: Insufficient documentation

## 2018-06-04 DIAGNOSIS — K2950 Unspecified chronic gastritis without bleeding: Secondary | ICD-10-CM

## 2018-06-04 DIAGNOSIS — R1110 Vomiting, unspecified: Secondary | ICD-10-CM | POA: Diagnosis present

## 2018-06-04 LAB — CBC WITH DIFFERENTIAL/PLATELET
BASOS PCT: 1 %
Basophils Absolute: 0.1 10*3/uL (ref 0–0.1)
EOS ABS: 0.3 10*3/uL (ref 0–0.7)
Eosinophils Relative: 4 %
HEMATOCRIT: 37.4 % (ref 35.0–47.0)
Hemoglobin: 12.9 g/dL (ref 12.0–16.0)
Lymphocytes Relative: 41 %
Lymphs Abs: 3.1 10*3/uL (ref 1.0–3.6)
MCH: 33 pg (ref 26.0–34.0)
MCHC: 34.4 g/dL (ref 32.0–36.0)
MCV: 95.8 fL (ref 80.0–100.0)
MONOS PCT: 7 %
Monocytes Absolute: 0.5 10*3/uL (ref 0.2–0.9)
NEUTROS ABS: 3.6 10*3/uL (ref 1.4–6.5)
Neutrophils Relative %: 47 %
Platelets: 238 10*3/uL (ref 150–440)
RBC: 3.9 MIL/uL (ref 3.80–5.20)
RDW: 13 % (ref 11.5–14.5)
WBC: 7.6 10*3/uL (ref 3.6–11.0)

## 2018-06-04 LAB — COMPREHENSIVE METABOLIC PANEL
ALBUMIN: 3.9 g/dL (ref 3.5–5.0)
ALT: 16 U/L (ref 14–54)
ANION GAP: 9 (ref 5–15)
AST: 19 U/L (ref 15–41)
Alkaline Phosphatase: 63 U/L (ref 38–126)
BILIRUBIN TOTAL: 0.6 mg/dL (ref 0.3–1.2)
BUN: 10 mg/dL (ref 6–20)
CO2: 25 mmol/L (ref 22–32)
Calcium: 8.7 mg/dL — ABNORMAL LOW (ref 8.9–10.3)
Chloride: 104 mmol/L (ref 101–111)
Creatinine, Ser: 0.76 mg/dL (ref 0.44–1.00)
GFR calc Af Amer: >60 mL/min (ref >60)
GFR calc non Af Amer: >60 mL/min (ref >60)
GLUCOSE: 97 mg/dL (ref 65–99)
POTASSIUM: 3.6 mmol/L (ref 3.5–5.1)
Sodium: 138 mmol/L (ref 135–145)
TOTAL PROTEIN: 7.7 g/dL (ref 6.5–8.1)

## 2018-06-04 LAB — LIPASE, BLOOD: LIPASE: 31 U/L (ref 11–51)

## 2018-06-04 LAB — TROPONIN I

## 2018-06-04 MED ORDER — METOCLOPRAMIDE HCL 10 MG PO TABS: 10.0000 mg | ORAL_TABLET | Freq: Four times a day (QID) | ORAL | 0 refills | Status: DC | PRN

## 2018-06-04 MED ORDER — PANTOPRAZOLE SODIUM 40 MG PO TBEC: 40.0000 mg | DELAYED_RELEASE_TABLET | Freq: Every day | ORAL | 1 refills | Status: DC

## 2018-06-04 MED ORDER — GI COCKTAIL ~~LOC~~
30.0000 mL | Freq: Once | ORAL | Status: AC
  Administered 2018-06-04: 30 mL via ORAL
  Filled 2018-06-04: qty 30

## 2018-06-04 MED ORDER — METOCLOPRAMIDE HCL 5 MG/ML IJ SOLN
INTRAMUSCULAR | Status: AC
  Administered 2018-06-04: 10 mg via INTRAVENOUS
  Filled 2018-06-04: qty 2

## 2018-06-04 MED ORDER — SUCRALFATE 1 G PO TABS: 1.0000 g | ORAL_TABLET | Freq: Three times a day (TID) | ORAL | 0 refills | Status: DC

## 2018-06-04 MED ORDER — METOCLOPRAMIDE HCL 5 MG/ML IJ SOLN
10.0000 mg | Freq: Once | INTRAMUSCULAR | Status: AC
  Administered 2018-06-04: 10 mg via INTRAVENOUS

## 2018-06-04 NOTE — ED Notes (Signed)
ED Provider at bedside. 

## 2018-06-04 NOTE — ED Notes (Signed)
Pt ambulatory with husband to POV. Discharge instructions, RX and follow up discussed. All questions and concerns addressed.

## 2018-06-04 NOTE — ED Provider Notes (Signed)
[facility] Emergency Department Provider Note    First MD Initiated Contact with Patient 06/04/18 1912     (approximate)  I have reviewed the triage vital signs and the nursing notes.   HISTORY  Chief Complaint Gastroesophageal Reflux and Emesis    HPI Lori Oliver is a 47 y.o. female with a history of obesity who was well as several months of reflux who is supposed to be on omeprazole but has not been taking it except for intermittently read presents to the ER with several days of progressively worsening epigastric discomfort and frequent reflux even sometimes regurgitating food roughly 30 minutes after eating.  Denies any pain at this time.  No chest pain or shortness of breath.  No fevers.  States that she is able to eat and drink but her husband gives an example of them driving down the road and will state that she just "burped up benign and "and she will spit it out the window.  States that this been getting worse over the past several weeks.    Past Medical History:  Diagnosis Date  . Abnormal thyroid function test 05/16/2016  . Atrial fibrillation ([place])   . 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 Decreased libido 05/16/2016  . Morbid obesity ([place]) 06/01/2015   Family History  Problem Relation Age of Onset  . Hypertension Mother   . Heart disease Mother   . Arthritis Mother   . Depression Mother   . Stroke Maternal Grandmother    Past Surgical History:  Procedure Laterality Date  . BREAST BIOPSY Right 2015   benign  . HAMMER TOE SURGERY     Patient Active Problem List   Diagnosis Date Noted  . Intermittent atrial fibrillation ([place]) 02/19/2018  . Hypokalemia 02/10/2018  . Anxiety about health 01/13/2018  . Atrial fibrillation with RVR ([place]) 12/01/2017  . Vitamin D deficiency 07/08/2017  . Lower back pain 07/07/2017  . Screen for colon cancer 07/07/2017  . Morbid obesity ([place]) 07/07/2017  . Essential hypertension 07/11/2016  .  Preventative health care 05/16/2016  . IFG (impaired fasting glucose) 05/16/2016  . 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 Abnormal thyroid function test 05/16/2016  . Decreased libido 05/16/2016  . Breast cancer screening 05/16/2016  . Breast lump on left side at 5 o'clock position 05/16/2016  . Contraception management 05/16/2016  . Surveillance for birth control, oral contraceptives 02/12/2016  . Bilateral thoracic back pain 06/01/2015  . Blood pressure elevated without history of HTN 06/01/2015  . History of breast lump 06/01/2015  . History of abnormal cervical Pap smear 06/01/2015      Prior to Admission medications   Medication Sig Start Date End Date Taking? Authorizing Provider  hydrochlorothiazide (MICROZIDE) 12.5 MG capsule Take 12.5 mg by mouth as needed.     [provider]  LORazepam (ATIVAN) 1 MG tablet Take 1 tablet (1 mg total) by mouth every 8 (eight) hours as needed for anxiety. 02/08/18   Paulette Blanch, MD  medroxyPROGESTERone (DEPO-PROVERA) 150 MG/ML injection Inject 1 mL (150 mg total) into the muscle every 3 (three) months. 03/13/18   Rubie Maid, MD  metoCLOPramide (REGLAN) 10 MG tablet Take 1 tablet (10 mg total) by mouth every 6 (six) hours as needed. 06/04/18 06/04/19  Merlyn Lot, MD  pantoprazole (PROTONIX) 40 MG tablet Take 1 tablet (40 mg total) by mouth daily. 06/04/18 06/04/19  Merlyn Lot, MD  propranolol (INDERAL) 20 MG tablet Take 1 tablet (20  mg total) by mouth 3 (three) times daily as needed. 03/17/18   Minna Merritts, MD  sucralfate (CARAFATE) 1 g tablet Take 1 tablet (1 g total) by mouth 4 (four) times daily -  with meals and at bedtime. 06/04/18   Merlyn Lot, MD    Allergies Patient has no known allergies.    Social History Social History   Tobacco Use  . Smoking status: Never Smoker  . Smokeless tobacco: Never Used  Substance Use Topics  . Alcohol use: Yes    Alcohol/week: 0.0 oz    Frequency: Never     Comment: occ  . Drug use: No    Review of Systems Patient denies headaches, rhinorrhea, blurry vision, numbness, shortness of breath, chest pain, edema, cough, abdominal pain, nausea, vomiting, diarrhea, dysuria, fevers, rashes or hallucinations unless otherwise stated above in HPI. ____________________________________________   PHYSICAL EXAM:  VITAL SIGNS: Vitals:   06/04/18 1756  BP: (!) 149/89  Pulse: 70  Resp: 14  Temp: 99 F (37.2 C)  SpO2: 100%    Constitutional: Alert and oriented.  Eyes: Conjunctivae are normal.  Head: Atraumatic. Nose: No congestion/rhinnorhea. Mouth/Throat: Mucous membranes are moist.   Neck: No stridor. Painless ROM.  Cardiovascular: Normal rate, regular rhythm. Grossly normal heart sounds.  Good peripheral circulation. Respiratory: Normal respiratory effort.  No retractions. Lungs CTAB. Gastrointestinal: Soft and nontender. No distention. No abdominal bruits. No CVA tenderness. Genitourinary:  Musculoskeletal: No lower extremity tenderness nor edema.  No joint effusions. Neurologic:  Normal speech and language. No gross focal neurologic deficits are appreciated. No facial droop Skin:  Skin is warm, dry and intact. No rash noted. Psychiatric: Mood and affect are normal. Speech and behavior are normal.  ____________________________________________   LABS (all labs ordered are listed, but only abnormal results are displayed)  Results for orders placed or performed during the hospital encounter of 06/04/18 (from the past 24 hour(s))  CBC with Differential/Platelet     Status: None   Collection Time: 06/04/18  7:15 PM  Result Value Ref Range   WBC 7.6 3.6 - 11.0 K/uL   RBC 3.90 3.80 - 5.20 MIL/uL   Hemoglobin 12.9 12.0 - 16.0 g/dL   HCT 37.4 35.0 - 47.0 %   MCV 95.8 80.0 - 100.0 fL   MCH 33.0 26.0 - 34.0 pg   MCHC 34.4 32.0 - 36.0 g/dL   RDW 13.0 11.5 - 14.5 %   Platelets 238 150 - 440 K/uL   Neutrophils Relative % 47 %   Neutro Abs 3.6  1.4 - 6.5 K/uL   Lymphocytes Relative 41 %   Lymphs Abs 3.1 1.0 - 3.6 K/uL   Monocytes Relative 7 %   Monocytes Absolute 0.5 0.2 - 0.9 K/uL   Eosinophils Relative 4 %   Eosinophils Absolute 0.3 0 - 0.7 K/uL   Basophils Relative 1 %   Basophils Absolute 0.1 0 - 0.1 K/uL  Comprehensive metabolic panel     Status: Abnormal   Collection Time: 06/04/18  7:15 PM  Result Value Ref Range   Sodium 138 135 - 145 mmol/L   Potassium 3.6 3.5 - 5.1 mmol/L   Chloride 104 101 - 111 mmol/L   CO2 25 22 - 32 mmol/L   Glucose, Bld 97 65 - 99 mg/dL   BUN 10 6 - 20 mg/dL   Creatinine, Ser 0.76 0.44 - 1.00 mg/dL   Calcium 8.7 (L) 8.9 - 10.3 mg/dL   Total Protein 7.7 6.5 - 8.1 g/dL  Albumin 3.9 3.5 - 5.0 g/dL   AST 19 15 - 41 U/L   ALT 16 14 - 54 U/L   Alkaline Phosphatase 63 38 - 126 U/L   Total Bilirubin 0.6 0.3 - 1.2 mg/dL   GFR calc non Af Amer >60 >60 mL/min   GFR calc Af Amer >60 >60 mL/min   Anion gap 9 5 - 15  Troponin I     Status: None   Collection Time: 06/04/18  7:15 PM  Result Value Ref Range   Troponin I <0.03 <0.03 ng/mL  Lipase, blood     Status: None   Collection Time: 06/04/18  7:15 PM  Result Value Ref Range   Lipase 31 11 - 51 U/L   ____________________________________________  EKG My review and personal interpretation at Time: 20:43   Indication: epigastric pain  Rate: 60  Rhythm: sinus Axis: normal Other: normal intervals, no stemi ____________________________________________  RADIOLOGY  I personally reviewed all radiographic images ordered to evaluate for the above acute complaints and reviewed radiology reports and findings.  These findings were personally discussed with the patient.  Please see medical record for radiology report.  ____________________________________________   PROCEDURES  Procedure(s) performed:  Procedures    Critical Care performed: no ____________________________________________   INITIAL IMPRESSION / ASSESSMENT AND PLAN / ED  COURSE  Pertinent labs & imaging results that were available during my care of the patient were reviewed by me and considered in my medical decision making (see chart for details).   DDX: reflux, esophagitis, gerd, esophageal stricture, enteritis  Luther Raliegh Ip Hansmann is a 47 y.o. who presents to the ED with several weeks and months of symptoms as described above.  Patient nontoxic-appearing she is afebrile and hemodynamically stable.  Blood work sent for the above differential shows no evidence of metabolic derangement or AKI.  No evidence of biliary pathology or hepatitis.  No evidence of pancreatitis.  EKG shows no evidence of acute ischemia and troponins negative.  Chest x-ray does not show any evidence of abnormal bowel gas pattern.  Possible hiatal hernia.  Probable component of gastritis and persistent reflux.  Patient will need referral to GI which I have given her.  Have also instructed her on use of antiemetic medication in conjunction with taking her Protonix.  Discussed signs and symptoms for which the patient should return immediately to hospital.      As part of my medical decision making, I reviewed the following data within the [place] notes reviewed and incorporated, Labs reviewed, notes from prior ED visits.   ____________________________________________   FINAL CLINICAL IMPRESSION(S) / ED DIAGNOSES  Final diagnoses:  Chronic gastritis, presence of bleeding unspecified, unspecified gastritis type  Reflux gastritis      NEW MEDICATIONS STARTED DURING THIS VISIT:  New Prescriptions   METOCLOPRAMIDE (REGLAN) 10 MG TABLET    Take 1 tablet (10 mg total) by mouth every 6 (six) hours as needed.   PANTOPRAZOLE (PROTONIX) 40 MG TABLET    Take 1 tablet (40 mg total) by mouth daily.   SUCRALFATE (CARAFATE) 1 G TABLET    Take 1 tablet (1 g total) by mouth 4 (four) times daily -  with meals and at bedtime.     Note:  This document was prepared using  Dragon voice recognition software and may include unintentional dictation errors.    Merlyn Lot, MD 06/04/18 2050

## 2018-06-04 NOTE — ED Notes (Signed)
Pt to the er for symptoms of acid reflux. Pt has a hx of but has not been taking her omeprazole. Pt says after she eats she feels like something is stuck in her chest and she vomits at times after eating. Pt states her husband doesn't want her to lay down because it scares him. Advised pt that she should not lay down after eating. Lungs are clear. Pt denies constipation.

## 2018-06-04 NOTE — Discharge Instructions (Addendum)

## 2018-06-04 NOTE — ED Triage Notes (Signed)
Patient reports for the past three weeks about 20 minutes after she eats, she has episodes of emesis. States "it feels like the food is stuck after I eat and not going down to my stomach and being digested" Patient saw PCP and was prescribed omazeprol that has not been helping. Patient reports this has gotten worse the past two days and that's why she came to the ER

## 2018-06-05 ENCOUNTER — Ambulatory Visit: Payer: Managed Care, Other (non HMO)

## 2018-06-08 ENCOUNTER — Ambulatory Visit: Payer: Managed Care, Other (non HMO)

## 2018-06-08 ENCOUNTER — Other Ambulatory Visit: Payer: Self-pay | Admitting: *Deleted

## 2018-06-08 NOTE — Patient Outreach (Addendum)
[place] [facility]) Care Management  06/08/2018  Lori Oliver 1971/08/06 [id number]   Subjective: Telephone call to patient's home  / mobile number, no answer, left HIPAA compliant voicemail message, and requested call back.      Objective: Per KPN (Knowledge Performance Now, point of care tool), [employer], and chart review, patient has not had any recent hospitalization.   Patient has had the following ED visits:   06/04/18 for chronic gastritis, 03/27/18 for chest pain, stress, 02/28/18 for Sprain of medial collateral ligament of left knee, 02/21/18 for Palpitations, and 02/11/18 for left arm pain.   [place] completed last ED high utilization follow up call on 03/05/18 and patient declined services.   Patient also has a history of hypertension and atrial fibrillation.        Assessment: Received 06/05/18 [facility] Consult for high ED utilization.  [facility] Consult follow up pending patient contact.      Plan: RNCM will send unsuccessful outreach  letter, [facility] pamphlet, will call patient for 2nd telephone outreach attempt, [facility] Consult follow up, and proceed with case closure, within 10 business days if no return call.       Amijah Timothy H. Annia Friendly, BSN, [place] Management [facility] Telephonic CM Phone: [phone] Fax: [phone]

## 2018-06-09 ENCOUNTER — Other Ambulatory Visit: Payer: Self-pay | Admitting: *Deleted

## 2018-06-09 ENCOUNTER — Ambulatory Visit (INDEPENDENT_AMBULATORY_CARE_PROVIDER_SITE_OTHER): Payer: Managed Care, Other (non HMO) | Admitting: Obstetrics and Gynecology

## 2018-06-09 VITALS — BP 130/85 | HR 99 | Wt 257.1 lb

## 2018-06-09 DIAGNOSIS — Z3042 Encounter for surveillance of injectable contraceptive: Secondary | ICD-10-CM | POA: Diagnosis not present

## 2018-06-09 MED ORDER — MEDROXYPROGESTERONE ACETATE 150 MG/ML IM SUSP: 150.0000 mg | INTRAMUSCULAR | 3 refills | Status: DC

## 2018-06-09 MED ORDER — MEDROXYPROGESTERONE ACETATE 150 MG/ML IM SUSP
150.0000 mg | Freq: Once | INTRAMUSCULAR | Status: AC
  Administered 2018-06-09: 150 mg via INTRAMUSCULAR

## 2018-06-09 NOTE — Progress Notes (Signed)
Date last pap: na Last Depo-Provera: 03/18/18 Side Effects if any: na Serum HCG indicated? na Depo-Provera 150 mg IM given by: Keturah Barre, CMA Next appointment due Sept 10-Sept 24 BP 130/85   Pulse 99   Wt 257 lb 1.6 oz (116.6 kg)   BMI 41.50 kg/m    Pt would like to discuss with Dr. Marcelline Mates about her depo provera inj, she feels like its causing her to gain weight would like to take a OCP, pls advise

## 2018-06-09 NOTE — Patient Outreach (Signed)
[facility] [facility]) Care Management  06/09/2018  Lori Oliver [birth year] [id number]   Subjective: Telephone call to patient's home  / mobile number, no answer, left HIPAA compliant voicemail message, and requested call back.      Objective: Per KPN (Knowledge Performance Now, point of care tool), [employer], and chart review, patient has not had any recent hospitalization.   Patient has had the following ED visits:   06/04/18 for chronic gastritis, 03/27/18 for chest pain, stress, 02/28/18 for Sprain of medial collateral ligament of left knee, 02/21/18 for Palpitations, and 02/11/18 for left arm pain.   [place] completed last ED high utilization follow up call on 03/05/18 and patient declined services.   Patient also has a history of hypertension and atrial fibrillation.        Assessment: Received 06/05/18 [facility] Consult for high ED utilization.  [facility] Consult follow up pending patient contact.      Plan: RNCM has sent unsuccessful outreach  letter, [facility] pamphlet, will call patient for 3rd telephone outreach attempt, [facility] Consult follow up, and proceed with case closure, within 10 business days if no return call.      Babita Amaker H. Annia Friendly, BSN, [place] Management [facility] Telephonic CM Phone: [phone] Fax: [phone]

## 2018-06-10 ENCOUNTER — Other Ambulatory Visit: Payer: Self-pay | Admitting: *Deleted

## 2018-06-10 NOTE — Patient Outreach (Signed)
[place] [facility]) Care Management  06/10/2018  Lori Oliver [birth year] [id number]   Subjective:Telephone call to patient's home / mobile number, no answer, left HIPAA compliant voicemail message, and requested call back.      Objective:Per KPN (Knowledge Performance Now, point of care tool), [employer], and chart review, patienthas not had any recent hospitalization. Patient has had the following ED visits: 06/04/18 for chronic gastritis, 03/27/18 for chest pain, stress, 02/28/18 for Sprain of medial collateral ligament of left knee, 02/21/18 forPalpitations, and 02/11/18 for left arm pain. [place] completed last ED high utilization follow up call on 03/05/18 and patient declined services. Patient also has a history of hypertension and atrial fibrillation.     Assessment: Received 06/05/18 [facility] Consult for high ED utilization.[facility] Consult follow up pending patient contact.     Plan:RNCM has sent unsuccessful outreach letter, [facility] pamphlet, and will proceed with case closure, within 10business days if no return call.      Mckensie Scotti H. Annia Friendly, BSN, [place] Management [facility] Telephonic CM Phone: [phone] Fax: [phone]

## 2018-06-10 NOTE — Progress Notes (Signed)
I have reviewed the record and concur with patient management and plan. Patient can schedule an appointment to further discuss other birth control options.   Rubie Maid, MD [facility]

## 2018-06-17 ENCOUNTER — Telehealth: Payer: Self-pay | Admitting: Cardiovascular Disease

## 2018-06-17 NOTE — Telephone Encounter (Signed)
Spoke with patient and she states that she had received some bad news and had some anxiety. She took her ativan and did have some episodes of fast heart rates in the low 100's. Reviewed at length about her medications and heart rates along with medications. She verbalized understanding of our conversation with no further questions at this time.

## 2018-06-17 NOTE — Telephone Encounter (Signed)
Pt calling   Pt c/o medication issue:  1. Name of Medication: Propanolol   2. How are you currently taking this medication (dosage and times per day)? 20 mg   3. Are you having a reaction (difficulty breathing--STAT)?   4. What is your medication issue? She states she's not needed the medication, states she's been hit with some hard news this past week. She's not feeling any fluttering or anything. She states she's take Lorazepam Would like to know if it is okay to take with heart medications   Please advise

## 2018-06-19 ENCOUNTER — Telehealth: Payer: Self-pay | Admitting: Family Medicine

## 2018-06-19 MED ORDER — LORAZEPAM 1 MG PO TABS: 0.5000 mg | ORAL_TABLET | Freq: Four times a day (QID) | ORAL | 0 refills | Status: DC | PRN

## 2018-06-19 NOTE — Telephone Encounter (Signed)
I spoke with patient; her husband has been diagnosed with cancer; her life is flipped upside down She found out last Wednesday; praying and trusting in God; he'll be going through chemo Okay for refill of ativan; no alcohol and no sleeping pills and no narcotic pain medicine She is taking pantoprazole now for acid reflux Not on metformin, did not tolerate it, not taking Cancel the July appointment; she has a lot of things coming up with her husband I can see her in August for f/u and she'll call for that later

## 2018-06-19 NOTE — Telephone Encounter (Signed)
Copied from [place] [id number]. Topic: Quick Communication - See Telephone Encounter >> Jun 19, 2018  9:54 AM Rutherford Nail, NT wrote: CRM for notification. See Telephone encounter for: 06/19/18. Patient calling and states that she would like Dr Sanda Klein to give her a call back. When asked if it was regarding anything in particular, she states that it is personal. Please advise. CB#: [phone]

## 2018-06-19 NOTE — Telephone Encounter (Signed)
Would you like for me to call or would you prefer to handle this situation?

## 2018-06-22 NOTE — Telephone Encounter (Signed)
Per Dr Sanda Klein request I have cancelled the July appt

## 2018-06-24 ENCOUNTER — Other Ambulatory Visit: Payer: Self-pay | Admitting: *Deleted

## 2018-06-24 ENCOUNTER — Encounter: Payer: Self-pay | Admitting: Nurse Practitioner

## 2018-06-24 ENCOUNTER — Ambulatory Visit (INDEPENDENT_AMBULATORY_CARE_PROVIDER_SITE_OTHER): Payer: Managed Care, Other (non HMO) | Admitting: Nurse Practitioner

## 2018-06-24 VITALS — BP 124/80 | HR 74 | Temp 98.5°F | Resp 16 | Ht 66.0 in | Wt 256.2 lb

## 2018-06-24 DIAGNOSIS — R399 Unspecified symptoms and signs involving the genitourinary system: Secondary | ICD-10-CM

## 2018-06-24 LAB — POCT URINALYSIS DIPSTICK
BILIRUBIN UA: NEGATIVE
Glucose, UA: NEGATIVE
Ketones, UA: NEGATIVE
Nitrite, UA: NEGATIVE
PH UA: 6 (ref 5.0–8.0)
PROTEIN UA: NEGATIVE
Spec Grav, UA: 1.01 (ref 1.010–1.025)
UROBILINOGEN UA: 0.2 U/dL

## 2018-06-24 MED ORDER — SULFAMETHOXAZOLE-TRIMETHOPRIM 800-160 MG PO TABS: 1.0000 | ORAL_TABLET | Freq: Two times a day (BID) | ORAL | 0 refills | Status: AC

## 2018-06-24 NOTE — Patient Outreach (Signed)
[place] [facility]) Care Management  06/24/2018  Lori Oliver [birth year] [id number]    No response from patient outreach attempts will proceed with case closure.     Objective:Per KPN (Knowledge Performance Now, point of care tool), [employer], and chart review, patienthas not had any recent hospitalization. Patient has had the following ED visits: 06/04/18 for chronic gastritis, 03/27/18 for chest pain, stress, 02/28/18 for Sprain of medial collateral ligament of left knee, 02/21/18 forPalpitations, and 02/11/18 for left arm pain. Lori Oliver completed last ED high utilization follow up call on 03/05/18 and patient declined services. Patient also has a history of hypertension and atrial fibrillation.     Assessment: Received 06/05/18 [facility] Consult for high ED utilization.[facility] Consult follow up not completed patient unable to reach and will proceed with case closure.      Plan:Case closure due to unable to reach.     Lori Oliver H. Annia Friendly, BSN, [place] Management [facility] Telephonic CM Phone: [phone] Fax: [phone]

## 2018-06-24 NOTE — Patient Instructions (Signed)
Your symptoms should begin to improve within a day of starting antibiotics. But you should finish all the antibiotic pills you get to ensure the bacteria is killed and prevent antibiotic resistance.  If you are having pain when you pee, you can also take a medicine to numb your bladder. Look for Phenazopyridine (Pyridium or AZO) over the counter at your pharmacy. This medicine eases the pain caused by urinary tract infections. It also reduces the need to urinate.  If you frequently get UTI's here are some prevention tips:  - Avoiding spermicides  - Drinking more fluid - This can help prevent bladder infections. ?Urinating right after sex - Some doctors think this helps, because it helps flush out germs that might get into the bladder during sex. There is no proof it works, but it also cannot hurt. ?Vaginal estrogen - If you are a woman who has already been through menopause, your doctor might suggest this. Vaginal estrogen comes in a cream or a flexible ring that you put into your vagina. It can help prevent bladder infections. ?Antibiotics - If you get a lot of bladder infections, and the above methods have not helped, your doctor might give you antibiotics to help prevent infection. But taking antibiotics has downsides, so doctors usually suggest trying other things first   The studies suggesting that cranberry products prevent bladder infections are not very good. Other studies suggest that cranberry products do not prevent bladder infections. But if you want to try cranberry products for this purpose, there is probably not much harm in doing so.   

## 2018-06-24 NOTE — Progress Notes (Addendum)
Name: Lori Oliver   MRN: [id number]    DOB: [birth year]   Date:06/24/2018       Progress Note  Subjective  Chief Complaint  Chief Complaint  Patient presents with  . URI    She has had dysuria and urinary urgency x 1 week. She has been treating symptoms with AZO with fair improvement.    HPI  Patient notes one week of dysuria, urinary frequency and suprapubic pain. States pain is relieved when taking azo but still having frequency. Drinks plenty of water. No fevers, chills, n/v.   Patient Active Problem List   Diagnosis Date Noted  . Intermittent atrial fibrillation ([place]) 02/19/2018  . Hypokalemia 02/10/2018  . Anxiety about health 01/13/2018  . Atrial fibrillation with RVR ([place]) 12/01/2017  . Vitamin D deficiency 07/08/2017  . Lower back pain 07/07/2017  . Screen for colon cancer 07/07/2017  . Morbid obesity ([place]) 07/07/2017  . Essential hypertension 07/11/2016  . Preventative health care 05/16/2016  . IFG (impaired fasting glucose) 05/16/2016  . 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 Abnormal thyroid function test 05/16/2016  . Decreased libido 05/16/2016  . Breast cancer screening 05/16/2016  . Breast lump on left side at 5 o'clock position 05/16/2016  . Contraception management 05/16/2016  . Surveillance for birth control, oral contraceptives 02/12/2016  . Bilateral thoracic back pain 06/01/2015  . Blood pressure elevated without history of HTN 06/01/2015  . History of breast lump 06/01/2015  . History of abnormal cervical Pap smear 06/01/2015    Past Medical History:  Diagnosis Date  . Abnormal thyroid function test 05/16/2016  . Atrial fibrillation ([place])   . 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 Decreased libido 05/16/2016  . Morbid obesity ([place]) 06/01/2015    Past Surgical History:  Procedure Laterality Date  . BREAST BIOPSY Right 2015   benign  . HAMMER TOE SURGERY      Social History   Tobacco Use  . Smoking status: Never Smoker  . Smokeless  tobacco: Never Used  Substance Use Topics  . Alcohol use: Yes    Alcohol/week: 0.0 oz    Frequency: Never    Comment: occ     Current Outpatient Medications:  .  hydrochlorothiazide (MICROZIDE) 12.5 MG capsule, Take 12.5 mg by mouth as needed. , Disp: , Rfl:  .  LORazepam (ATIVAN) 1 MG tablet, Take 0.5-1 tablets (0.5-1 mg total) by mouth every 6 (six) hours as needed for anxiety., Disp: 15 tablet, Rfl: 0 .  medroxyPROGESTERone (DEPO-PROVERA) 150 MG/ML injection, Inject 1 mL (150 mg total) into the muscle every 3 (three) months., Disp: 1 mL, Rfl: 3 .  pantoprazole (PROTONIX) 40 MG tablet, Take 1 tablet (40 mg total) by mouth daily., Disp: 30 tablet, Rfl: 1 .  propranolol (INDERAL) 20 MG tablet, Take 1 tablet (20 mg total) by mouth 3 (three) times daily as needed., Disp: 90 tablet, Rfl: 3 .  metoCLOPramide (REGLAN) 10 MG tablet, Take 1 tablet (10 mg total) by mouth every 6 (six) hours as needed. (Patient not taking: Reported on 06/24/2018), Disp: 12 tablet, Rfl: 0 .  sucralfate (CARAFATE) 1 g tablet, Take 1 tablet (1 g total) by mouth 4 (four) times daily -  with meals and at bedtime. (Patient not taking: Reported on 06/24/2018), Disp: 20 tablet, Rfl: 0  No Known Allergies  ROS  No other specific complaints in a complete review of systems (except as listed in HPI above).  Objective  Vitals:   06/24/18 1557  Pulse: 74  Resp: 16  Temp: 98.5 F (36.9 C)  TempSrc: Oral  SpO2: 96%  Weight: 256 lb 3.2 oz (116.2 kg)  Height: 5\' 6"  (1.676 m)     Body mass index is 41.35 kg/m.  Nursing Note and Vital Signs reviewed.  Physical Exam   Constitutional: Patient appears well-developed and well-nourished.  Cardiovascular: Normal rate, regular rhythm, S1/S2 present.    Pulmonary/Chest: Effort normal and breath sounds clear.  Abdominal: Soft and non-tender throughout except mild tenderness in suprapubic area, bowel sounds present, no CVA tenderness.  Psychiatric: Patient has a normal  mood and affect. behavior is normal. Judgment and thought content normal.  No results found for this or any previous visit (from the past 72 hour(s)).  Assessment & Plan  1. Urinary tract infection symptoms Drink plenty of water; do not take AZO more then 3 days; if still having pain let us know  - POCT Urinalysis Dipstick - Urine Culture - sulfamethoxazole-trimethoprim (BACTRIM DS) 800-160 MG tablet; Take 1 tablet by mouth 2 (two) times daily for 3 days.  Dispense: 6 tablet; Refill: 0  Follow up and care instructions discussed and provided in AVS.  ----------------------------------------------- I have reviewed this encounter including the documentation in this note and/or discussed this patient with the provider, Suezanne Cheshire DNP AGNP-C. I am certifying that I agree with the content of this note as supervising physician. Enid Derry, [facility] 06/24/2018, 4:56 PM

## 2018-06-25 LAB — URINE CULTURE
MICRO NUMBER:: 90818240
RESULT: NO GROWTH
SPECIMEN QUALITY: ADEQUATE

## 2018-06-29 ENCOUNTER — Telehealth: Payer: Self-pay

## 2018-06-29 DIAGNOSIS — B379 Candidiasis, unspecified: Secondary | ICD-10-CM

## 2018-06-29 DIAGNOSIS — R399 Unspecified symptoms and signs involving the genitourinary system: Secondary | ICD-10-CM

## 2018-06-29 MED ORDER — FLUCONAZOLE 150 MG PO TABS: 150.0000 mg | ORAL_TABLET | Freq: Once | ORAL | 0 refills | Status: AC

## 2018-06-29 NOTE — Telephone Encounter (Signed)
Copied from [place] #[id number]. Topic: General - Other >> Jun 29, 2018 10:01 AM Judyann Munson wrote: Reason for CRM:  Patient is stating that the medication that she was given has not really help with her UTI, she is requesting if she can have something different sent in as well as something for Yeast Infection. Please advise   Her preferred pharmacy: [facility] [address], [place] - [place] [phone] (Phone) [phone] (Fax)

## 2018-06-29 NOTE — Telephone Encounter (Signed)
Will send in medication for yeast infection- diflucan sent to [facility]; patients urine culture showed- no growth of bacteria. If she is still having UTI symptoms we can have her drop in for a urine collection to recheck and send her to urology if it is negative.

## 2018-06-29 NOTE — Telephone Encounter (Signed)
Patient called.  Patient aware.  

## 2018-07-03 ENCOUNTER — Ambulatory Visit: Payer: Managed Care, Other (non HMO) | Admitting: Family Medicine

## 2018-08-11 ENCOUNTER — Encounter: Payer: Self-pay | Admitting: *Deleted

## 2018-08-12 ENCOUNTER — Ambulatory Visit: Payer: Managed Care, Other (non HMO) | Admitting: Certified Registered"

## 2018-08-12 ENCOUNTER — Encounter: Payer: Self-pay | Admitting: Certified Registered"

## 2018-08-12 ENCOUNTER — Ambulatory Visit
Admission: RE | Admit: 2018-08-12 | Discharge: 2018-08-12 | Disposition: A | Discharge status: Home / Self Care | Payer: Managed Care, Other (non HMO) | Source: Ambulatory Visit | Attending: Internal Medicine | Admitting: Internal Medicine

## 2018-08-12 ENCOUNTER — Encounter
Admission: RE | Disposition: A | Discharge status: Home / Self Care | Payer: Self-pay | Source: Ambulatory Visit | Attending: Internal Medicine

## 2018-08-12 DIAGNOSIS — I10 Essential (primary) hypertension: Secondary | ICD-10-CM | POA: Insufficient documentation

## 2018-08-12 DIAGNOSIS — K210 Gastro-esophageal reflux disease with esophagitis: Secondary | ICD-10-CM | POA: Diagnosis not present

## 2018-08-12 DIAGNOSIS — I4891 Unspecified atrial fibrillation: Secondary | ICD-10-CM | POA: Diagnosis not present

## 2018-08-12 DIAGNOSIS — Z6841 Body Mass Index (BMI) 40.0 and over, adult: Secondary | ICD-10-CM | POA: Diagnosis not present

## 2018-08-12 DIAGNOSIS — K228 Other specified diseases of esophagus: Secondary | ICD-10-CM | POA: Diagnosis not present

## 2018-08-12 DIAGNOSIS — Z793 Long term (current) use of hormonal contraceptives: Secondary | ICD-10-CM | POA: Insufficient documentation

## 2018-08-12 DIAGNOSIS — R1310 Dysphagia, unspecified: Secondary | ICD-10-CM | POA: Diagnosis present

## 2018-08-12 DIAGNOSIS — Z79899 Other long term (current) drug therapy: Secondary | ICD-10-CM | POA: Diagnosis not present

## 2018-08-12 DIAGNOSIS — F419 Anxiety disorder, unspecified: Secondary | ICD-10-CM | POA: Diagnosis not present

## 2018-08-12 DIAGNOSIS — K219 Gastro-esophageal reflux disease without esophagitis: Secondary | ICD-10-CM | POA: Diagnosis present

## 2018-08-12 HISTORY — DX: Anxiety disorder, unspecified: F41.9

## 2018-08-12 HISTORY — DX: Cardiac arrhythmia, unspecified: I49.9

## 2018-08-12 HISTORY — PX: ESOPHAGOGASTRODUODENOSCOPY (EGD) WITH PROPOFOL: SHX5813

## 2018-08-12 HISTORY — DX: Impaired fasting glucose: R73.01

## 2018-08-12 HISTORY — DX: Hypokalemia: E87.6

## 2018-08-12 SURGERY — ESOPHAGOGASTRODUODENOSCOPY (EGD) WITH PROPOFOL
Anesthesia: General

## 2018-08-12 MED ORDER — SODIUM CHLORIDE 0.9 % IV SOLN
INTRAVENOUS | Status: DC | PRN
  Administered 2018-08-12: 11:00:00 via INTRAVENOUS

## 2018-08-12 MED ORDER — LIDOCAINE HCL (PF) 2 % IJ SOLN
INTRAMUSCULAR | Status: AC
  Filled 2018-08-12: qty 10

## 2018-08-12 MED ORDER — LABETALOL HCL 5 MG/ML IV SOLN
5.0000 mg | INTRAVENOUS | Status: DC | PRN
  Administered 2018-08-12: 5 mg via INTRAVENOUS
  Filled 2018-08-12 (×2): qty 4

## 2018-08-12 MED ORDER — LIDOCAINE HCL (CARDIAC) PF 100 MG/5ML IV SOSY
PREFILLED_SYRINGE | INTRAVENOUS | Status: DC | PRN
  Administered 2018-08-12: 40 mg via INTRAVENOUS

## 2018-08-12 MED ORDER — SODIUM CHLORIDE 0.9 % IV SOLN
INTRAVENOUS | Status: DC
  Administered 2018-08-12: 1000 mL via INTRAVENOUS

## 2018-08-12 MED ORDER — PROPOFOL 500 MG/50ML IV EMUL
INTRAVENOUS | Status: AC
  Filled 2018-08-12: qty 50

## 2018-08-12 MED ORDER — PROPOFOL 10 MG/ML IV BOLUS
INTRAVENOUS | Status: DC | PRN
  Administered 2018-08-12: 50 mg via INTRAVENOUS
  Administered 2018-08-12: 150 mg via INTRAVENOUS

## 2018-08-12 MED ORDER — PROPOFOL 500 MG/50ML IV EMUL
INTRAVENOUS | Status: DC | PRN
  Administered 2018-08-12: 100 ug/kg/min via INTRAVENOUS

## 2018-08-12 NOTE — Transfer of Care (Signed)
Immediate Anesthesia Transfer of Care Note  Patient: Lori Oliver  Procedure(s) Performed: ESOPHAGOGASTRODUODENOSCOPY (EGD) WITH PROPOFOL (N/A )  Patient Location: Endoscopy Unit  Anesthesia Type:General  Level of Consciousness: drowsy and patient cooperative  Airway & Oxygen Therapy: Patient Spontanous Breathing  Post-op Assessment: Report given to RN, Post -op Vital signs reviewed and stable and Patient moving all extremities  Post vital signs: Reviewed and stable  Last Vitals:  Vitals Value Taken Time  BP 142/99 08/12/2018 11:48 AM  Temp    Pulse 92 08/12/2018 11:49 AM  Resp 14 08/12/2018 11:49 AM  SpO2 96 % 08/12/2018 11:49 AM  Vitals shown include unvalidated device data.  Last Pain:  Vitals:   08/12/18 1012  TempSrc: Tympanic  PainSc: 0-No pain         Complications: No apparent anesthesia complications

## 2018-08-12 NOTE — Interval H&P Note (Signed)
History and Physical Interval Note:  08/12/2018 10:23 AM  Lori Oliver  has presented today for surgery, with the diagnosis of GERD  The various methods of treatment have been discussed with the patient and family. After consideration of risks, benefits and other options for treatment, the patient has consented to  Procedure(s): ESOPHAGOGASTRODUODENOSCOPY (EGD) WITH PROPOFOL (N/A) as a surgical intervention .  The patient's history has been reviewed, patient examined, no change in status, stable for surgery.  I have reviewed the patient's chart and labs.  Questions were answered to the patient's satisfaction.     [place], [place]

## 2018-08-12 NOTE — Op Note (Signed)
[facility] Gastroenterology Patient Name: Lori Oliver Procedure Date: 08/12/2018 10:25 AM MRN: [id number] Account #: [id number] Date of Birth: 12-03-1971 Admit Type: Outpatient Age: 47 Room: [facility] ENDO ROOM 2 Gender: Female Note Status: Finalized Procedure:            Upper GI endoscopy Indications:          Dysphagia, Follow-up of esophageal reflux Providers:            Benay Pike. Alice Reichert MD, MD Referring MD:         Arnetha Courser (Referring MD) Medicines:            Propofol per Anesthesia Complications:        No immediate complications. Procedure:            Pre-Anesthesia Assessment:                       - The risks and benefits of the procedure and the                        sedation options and risks were discussed with the                        patient. All questions were answered and informed                        consent was obtained.                       - Patient identification and proposed procedure were                        verified prior to the procedure by the nurse. The                        procedure was verified in the procedure room.                       - ASA Grade Assessment: III - A patient with severe                        systemic disease.                       - After reviewing the risks and benefits, the patient                        was deemed in satisfactory condition to undergo the                        procedure.                       After obtaining informed consent, the endoscope was                        passed under direct vision. Throughout the procedure,                        the patient's blood pressure, pulse, and oxygen  saturations were monitored continuously. The Endoscope                        was introduced through the mouth, and advanced to the                        third part of duodenum. The upper GI endoscopy was                        accomplished without difficulty. The  patient tolerated                        the procedure well. Findings:      Diffuse mild mucosal changes characterized by longitudinal markings,       tight circumferential folds and white specks were found in the entire       esophagus. Biopsies were obtained from the proximal and distal esophagus       with cold forceps for histology of suspected eosinophilic esophagitis.       The scope was withdrawn. Dilation was performed with a Maloney dilator       with no resistance at 51 Fr.      The entire examined stomach was normal.      The examined duodenum was normal.      The exam was otherwise without abnormality. Impression:           - Longitudinally marked, tight circumferentially                        folded, white specked mucosa in the esophagus.                        Biopsied. Dilated.                       - Normal stomach.                       - Normal examined duodenum.                       - The examination was otherwise normal.                       - Dilation performed in the lower third of the                        esophagus. Recommendation:       - Patient has a contact number available for                        emergencies. The signs and symptoms of potential                        delayed complications were discussed with the patient.                        Return to normal activities tomorrow. Written discharge                        instructions were provided to the patient.                       -  Resume previous diet.                       - Continue present medications.                       - Await pathology results.                       - Return to GI office in 6 months. Procedure Code(s):    --- Professional ---                       [phone], Esophagogastroduodenoscopy, flexible, transoral;                        with biopsy, single or multiple                       43450, Dilation of esophagus, by unguided sound or                        bougie, single  or multiple passes Diagnosis Code(s):    --- Professional ---                       K22.8, Other specified diseases of esophagus                       R13.10, Dysphagia, unspecified                       K21.9, Gastro-esophageal reflux disease without                        esophagitis CPT copyright 2017 American Medical Association. All rights reserved. The codes documented in this report are preliminary and upon coder review may  be revised to meet current compliance requirements. Efrain Sella MD, MD 08/12/2018 11:52:56 AM This report has been signed electronically. Number of Addenda: 0 Note Initiated On: 08/12/2018 10:25 AM      [facility]

## 2018-08-12 NOTE — Anesthesia Post-op Follow-up Note (Signed)
Anesthesia QCDR form completed.        

## 2018-08-12 NOTE — Progress Notes (Signed)
Diastolic 346 and 219. Dr. Andree Elk notified and Labetalol IV 5 mg adm. With explanation to patient. Patient agrees to check blood pressure at home, take her blood pressure meds and check in with her primary MD. She has stressors at home and will be mindful of these. Husbands agrees.

## 2018-08-12 NOTE — H&P (Signed)
  Outpatient short stay form Pre-procedure 08/12/2018 10:22 AM Lori Oliver K. Alice Reichert, M.D.  Primary Physician: Enid Derry, MD  Reason for visit: GERD, esophageal dysphagia  History of present illness: 47 year old female presents for 52-month history of intermittent dysphagia to solid food getting stuck at the midsternal level.  She also has recurrent heartburn and regurgitation that has been resistant to treatment with Protonix 1 pill daily at 40 mg.  No hematemesis or melena.  No frank abdominal pain.   No current facility-administered medications for this encounter.   Medications Prior to Admission  Medication Sig Dispense Refill Last Dose  . traMADol (ULTRAM) 50 MG tablet Take by mouth every 6 (six) hours as needed.     . hydrochlorothiazide (MICROZIDE) 12.5 MG capsule Take 12.5 mg by mouth as needed.    Taking  . LORazepam (ATIVAN) 1 MG tablet Take 0.5-1 tablets (0.5-1 mg total) by mouth every 6 (six) hours as needed for anxiety. 15 tablet 0 Taking  . medroxyPROGESTERone (DEPO-PROVERA) 150 MG/ML injection Inject 1 mL (150 mg total) into the muscle every 3 (three) months. 1 mL 3 Taking  . metoCLOPramide (REGLAN) 10 MG tablet Take 1 tablet (10 mg total) by mouth every 6 (six) hours as needed. (Patient not taking: Reported on 06/24/2018) 12 tablet 0 Not Taking  . pantoprazole (PROTONIX) 40 MG tablet Take 1 tablet (40 mg total) by mouth daily. 30 tablet 1 Taking  . propranolol (INDERAL) 20 MG tablet Take 1 tablet (20 mg total) by mouth 3 (three) times daily as needed. 90 tablet 3 Taking  . sucralfate (CARAFATE) 1 g tablet Take 1 tablet (1 g total) by mouth 4 (four) times daily -  with meals and at bedtime. (Patient not taking: Reported on 06/24/2018) 20 tablet 0 Not Taking     No Known Allergies   Past Medical History:  Diagnosis Date  . Abnormal thyroid function test 05/16/2016  . Anxiety   . Atrial fibrillation ([place])   . 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 Decreased libido 05/16/2016   . Dysrhythmia   . Hypertension   . Hypokalemia   . Impaired fasting glucose   . Morbid obesity ([place]) 06/01/2015  . Morbid obesity ([place])     Review of systems:  Otherwise negative.    Physical Exam  Gen: Alert, oriented. Appears stated age.  HEENT: [place]/AT. PERRLA. Lungs: CTA, no wheezes. CV: RR nl S1, S2. Abd: soft, benign, no masses. BS+ Ext: No edema. Pulses 2+    Planned procedures: Proceed with EGD. The patient understands the nature of the planned procedure, indications, risks, alternatives and potential complications including but not limited to bleeding, infection, perforation, damage to internal organs and possible oversedation/side effects from anesthesia. The patient agrees and gives consent to proceed.  Please refer to procedure notes for findings, recommendations and patient disposition/instructions.     Pericles Carmicheal K. Alice Reichert, M.D. Gastroenterology 08/12/2018  10:22 AM

## 2018-08-12 NOTE — Anesthesia Preprocedure Evaluation (Signed)
Anesthesia Evaluation  Patient identified by MRN, date of birth, ID band Patient awake    Reviewed: Allergy & Precautions, H&P , NPO status , Patient's Chart, lab work & pertinent test results, reviewed documented beta blocker date and time   Airway Mallampati: III   Neck ROM: full    Dental  (+) Teeth Intact   Pulmonary neg pulmonary ROS,    Pulmonary exam normal        Cardiovascular Exercise Tolerance: Good hypertension, On Medications negative cardio ROS Normal cardiovascular exam+ dysrhythmias  Rhythm:regular Rate:Normal     Neuro/Psych PSYCHIATRIC DISORDERS Anxiety negative neurological ROS  negative psych ROS   GI/Hepatic negative GI ROS, Neg liver ROS,   Endo/Other  negative endocrine ROS  Renal/GU negative Renal ROS  negative genitourinary   Musculoskeletal   Abdominal   Peds  Hematology negative hematology ROS (+)   Anesthesia Other Findings Past Medical History: 05/16/2016: Abnormal thyroid function test No date: Anxiety No date: Atrial fibrillation ([place]) 05/16/2016: 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 05/16/2016: Decreased libido No date: Dysrhythmia No date: Hypertension No date: Hypokalemia No date: Impaired fasting glucose 06/01/2015: Morbid obesity ([place]) No date: Morbid obesity ([place]) Past Surgical History: 2015: BREAST BIOPSY; Right     Comment:  benign No date: HAMMER TOE SURGERY BMI    Body Mass Index:  40.35 kg/m     Reproductive/Obstetrics negative OB ROS                             Anesthesia Physical Anesthesia Plan  ASA: III  Anesthesia Plan: General   Post-op Pain Management:    Induction:   PONV Risk Score and Plan:   Airway Management Planned:   Additional Equipment:   Intra-op Plan:   Post-operative Plan:   Informed Consent: I have reviewed the patients History and Physical, chart, labs and discussed the procedure including the risks, benefits  and alternatives for the proposed anesthesia with the patient or authorized representative who has indicated his/her understanding and acceptance.   Dental Advisory Given  Plan Discussed with: CRNA  Anesthesia Plan Comments:         Anesthesia Quick Evaluation

## 2018-08-12 NOTE — Interval H&P Note (Signed)
History and Physical Interval Note:  08/12/2018 10:24 AM  Lori Oliver  has presented today for surgery, with the diagnosis of GERD  The various methods of treatment have been discussed with the patient and family. After consideration of risks, benefits and other options for treatment, the patient has consented to  Procedure(s): ESOPHAGOGASTRODUODENOSCOPY (EGD) WITH PROPOFOL (N/A) as a surgical intervention .  The patient's history has been reviewed, patient examined, no change in status, stable for surgery.  I have reviewed the patient's chart and labs.  Questions were answered to the patient's satisfaction.     [place], [place]

## 2018-08-12 NOTE — Anesthesia Postprocedure Evaluation (Signed)
Anesthesia Post Note  Patient: Lori Oliver  Procedure(s) Performed: ESOPHAGOGASTRODUODENOSCOPY (EGD) WITH PROPOFOL (N/A )  Patient location during evaluation: Endoscopy Anesthesia Type: General Level of consciousness: awake and alert, oriented and patient cooperative Pain management: satisfactory to patient Vital Signs Assessment: post-procedure vital signs reviewed and stable Respiratory status: spontaneous breathing and respiratory function stable Cardiovascular status: blood pressure returned to baseline and stable Postop Assessment: no headache, no backache, patient able to bend at knees, no apparent nausea or vomiting, adequate PO intake and able to ambulate Anesthetic complications: no     Last Vitals:  Vitals:   08/12/18 1012 08/12/18 1149  BP: (!) 145/65 (!) 142/99  Pulse: 69 95  Resp: 17 15  Temp: (!) 35.8 C (!) 36.3 C  SpO2: 100% 98%    Last Pain:  Vitals:   08/12/18 1149  TempSrc: Tympanic  PainSc: Asleep                 Denyla Cortese H Lucia Harm

## 2018-08-13 ENCOUNTER — Encounter: Payer: Self-pay | Admitting: Internal Medicine

## 2018-08-14 LAB — SURGICAL PATHOLOGY

## 2018-08-19 ENCOUNTER — Ambulatory Visit: Payer: Managed Care, Other (non HMO) | Admitting: Nurse Practitioner

## 2018-08-24 ENCOUNTER — Other Ambulatory Visit: Payer: Self-pay | Admitting: Family Medicine

## 2018-08-24 DIAGNOSIS — Z1231 Encounter for screening mammogram for malignant neoplasm of breast: Secondary | ICD-10-CM

## 2018-08-31 ENCOUNTER — Ambulatory Visit (INDEPENDENT_AMBULATORY_CARE_PROVIDER_SITE_OTHER): Payer: Managed Care, Other (non HMO) | Admitting: Nurse Practitioner

## 2018-08-31 ENCOUNTER — Encounter: Payer: Self-pay | Admitting: Nurse Practitioner

## 2018-08-31 VITALS — BP 132/90 | HR 85 | Temp 98.8°F | Resp 16 | Ht 66.0 in | Wt 262.8 lb

## 2018-08-31 DIAGNOSIS — I10 Essential (primary) hypertension: Secondary | ICD-10-CM

## 2018-08-31 DIAGNOSIS — R05 Cough: Secondary | ICD-10-CM

## 2018-08-31 DIAGNOSIS — J0100 Acute maxillary sinusitis, unspecified: Secondary | ICD-10-CM

## 2018-08-31 DIAGNOSIS — R059 Cough, unspecified: Secondary | ICD-10-CM

## 2018-08-31 MED ORDER — HYDROCHLOROTHIAZIDE 12.5 MG PO CAPS: 12.5000 mg | ORAL_CAPSULE | Freq: Every day | ORAL | 2 refills | Status: DC

## 2018-08-31 MED ORDER — BENZONATATE 100 MG PO CAPS: 200.0000 mg | ORAL_CAPSULE | Freq: Two times a day (BID) | ORAL | 0 refills | Status: DC | PRN

## 2018-08-31 MED ORDER — AZITHROMYCIN 250 MG PO TABS: ORAL_TABLET | ORAL | 0 refills | Status: DC

## 2018-08-31 MED ORDER — FLUTICASONE PROPIONATE 50 MCG/ACT NA SUSP: 2.0000 | Freq: Every day | NASAL | 6 refills | Status: DC

## 2018-08-31 NOTE — Progress Notes (Signed)
Name: NAYELIZ HIPP   MRN: [id number]    DOB: [birth year]   Date:08/31/2018       Progress Note  Subjective  Chief Complaint  Chief Complaint  Patient presents with  . Sinus Problem  . Hypertension    HPI Sinus Symptoms Patient presents with nasal congestion with clear nasal discharge, has some facial pressure. Pt endorses cough- strong productive cough with green sputum. Patient  denies fever,  ear pressure or fullness, headache, and halitosis,hearing loss, or tinnitus.  Symptoms have been ongoing for over 7 days. Pt states it was initially getting better but then it has gotten worse since then.  Treating it with OTC cough syrup.    Hypertension Saw Dr. [name]- cardiology on 03/17/2018. Taken of off HCTZ due to being normotensive,states take it PRN. Ordered propanolol PRN for palpitations due to paroxymal atrial fibrillation. States she took HCTZ this morning, did not take it yesterday. States does not add a lot of salt when cooking but does eat a lot on the go.   BP Readings from Last 3 Encounters:  08/31/18 132/90  08/12/18 140/85  06/24/18 124/80    Patient Active Problem List   Diagnosis Date Noted  . Intermittent atrial fibrillation ([place]) 02/19/2018  . Hypokalemia 02/10/2018  . Anxiety about health 01/13/2018  . Atrial fibrillation with RVR ([place]) 12/01/2017  . Vitamin D deficiency 07/08/2017  . Lower back pain 07/07/2017  . Screen for colon cancer 07/07/2017  . Morbid obesity ([place]) 07/07/2017  . Essential hypertension 07/11/2016  . Preventative health care 05/16/2016  . IFG (impaired fasting glucose) 05/16/2016  . 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 Abnormal thyroid function test 05/16/2016  . Decreased libido 05/16/2016  . Breast cancer screening 05/16/2016  . Breast lump on left side at 5 o'clock position 05/16/2016  . Contraception management 05/16/2016  . Surveillance for birth control, oral contraceptives 02/12/2016  . Bilateral thoracic back pain  06/01/2015  . Blood pressure elevated without history of HTN 06/01/2015  . History of breast lump 06/01/2015  . History of abnormal cervical Pap smear 06/01/2015    Past Medical History:  Diagnosis Date  . Abnormal thyroid function test 05/16/2016  . Anxiety   . Atrial fibrillation ([place])   . 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 Decreased libido 05/16/2016  . Dysrhythmia   . Hypertension   . Hypokalemia   . Impaired fasting glucose   . Morbid obesity ([place]) 06/01/2015  . Morbid obesity ([place])     Past Surgical History:  Procedure Laterality Date  . BREAST BIOPSY Right 2015   benign  . ESOPHAGOGASTRODUODENOSCOPY (EGD) WITH PROPOFOL N/A 08/12/2018   Procedure: ESOPHAGOGASTRODUODENOSCOPY (EGD) WITH PROPOFOL;  Surgeon: Toledo, Benay Pike, MD;  Location: [facility] ENDOSCOPY;  Service: Gastroenterology;  Laterality: N/A;  . HAMMER TOE SURGERY      Social History   Tobacco Use  . Smoking status: Never Smoker  . Smokeless tobacco: Never Used  Substance Use Topics  . Alcohol use: Yes    Alcohol/week: 0.0 standard drinks    Frequency: Never    Comment: occ     Current Outpatient Medications:  .  hydrochlorothiazide (MICROZIDE) 12.5 MG capsule, Take 12.5 mg by mouth as needed. , Disp: , Rfl:  .  LORazepam (ATIVAN) 1 MG tablet, Take 0.5-1 tablets (0.5-1 mg total) by mouth every 6 (six) hours as needed for anxiety., Disp: 15 tablet, Rfl: 0 .  pantoprazole (PROTONIX) 40 MG tablet, Take 1 tablet (40 mg total) by  mouth daily., Disp: 30 tablet, Rfl: 1 .  propranolol (INDERAL) 20 MG tablet, Take 1 tablet (20 mg total) by mouth 3 (three) times daily as needed., Disp: 90 tablet, Rfl: 3 .  medroxyPROGESTERone (DEPO-PROVERA) 150 MG/ML injection, Inject 1 mL (150 mg total) into the muscle every 3 (three) months. (Patient not taking: Reported on 08/31/2018), Disp: 1 mL, Rfl: 3 .  traMADol (ULTRAM) 50 MG tablet, Take by mouth every 6 (six) hours as needed., Disp: , Rfl:   No Known Allergies  Review  of Systems  Constitutional: Positive for malaise/fatigue. Negative for chills and fever.  HENT: Positive for congestion and sinus pain. Negative for ear pain and tinnitus.   Respiratory: Positive for cough. Negative for shortness of breath.   Cardiovascular: Positive for chest pain (chest soreness with coughing). Negative for palpitations.  Gastrointestinal: Negative for abdominal pain and nausea.  Musculoskeletal: Negative for myalgias.  Skin: Negative for rash.  Neurological: Negative for dizziness and headaches.    No other specific complaints in a complete review of systems (except as listed in HPI above).  Objective  Vitals:   08/31/18 1502  BP: 132/90  Pulse: 85  Resp: 16  Temp: 98.8 F (37.1 C)  TempSrc: Oral  SpO2: 99%  Weight: 262 lb 12.8 oz (119.2 kg)  Height: 5\' 6"  (1.676 m)    Body mass index is 42.42 kg/m.  Nursing Note and Vital Signs reviewed.  Physical Exam  Constitutional: She appears well-developed and well-nourished.  HENT:  Head: Normocephalic and atraumatic.  Right Ear: Tympanic membrane, external ear and ear canal normal.  Left Ear: Tympanic membrane, external ear and ear canal normal.  Nose: Mucosal edema and rhinorrhea present. Right sinus exhibits maxillary sinus tenderness. Right sinus exhibits no frontal sinus tenderness. Left sinus exhibits maxillary sinus tenderness. Left sinus exhibits no frontal sinus tenderness.  Mouth/Throat: Uvula is midline, oropharynx is clear and moist and mucous membranes are normal. No oropharyngeal exudate.  Eyes: Conjunctivae are normal. Right eye exhibits no discharge. Left eye exhibits no discharge.  Neck: Normal range of motion.  Cardiovascular: Normal rate and regular rhythm.  Pulmonary/Chest: Effort normal and breath sounds normal.  Abdominal: Soft. There is no tenderness.  Lymphadenopathy:    She has no cervical adenopathy.  Neurological: She is alert.  Skin: Skin is warm and dry. No rash noted. She is  not diaphoretic.  Psychiatric: She has a normal mood and affect. Her behavior is normal. Judgment and thought content normal.       No results found for this or any previous visit (from the past 48 hour(s)).  Assessment & Plan  1. Essential hypertension DASH information discussed and given; follow-up in 2-3 weeks.  - hydrochlorothiazide (MICROZIDE) 12.5 MG capsule; Take 1 capsule (12.5 mg total) by mouth daily.  Dispense: 30 capsule; Refill: 2  2. Acute non-recurrent maxillary sinusitis Biphasic pattern noted, discussed trying some OTC treatments, rest, fluids, nutrition and employed delayed antibiotics- if unimproved in 2-3 days can start antibiotics.  - fluticasone (FLONASE) 50 MCG/ACT nasal spray; Place 2 sprays into both nostrils daily.  Dispense: 16 g; Refill: 6 - azithromycin (ZITHROMAX) 250 MG tablet; 2 tablets on the first day, and one tablet for the next 4 days.  Dispense: 6 tablet; Refill: 0  3. Cough - benzonatate (TESSALON PERLES) 100 MG capsule; Take 2 capsules (200 mg total) by mouth 2 (two) times daily as needed for cough.  Dispense: 30 capsule; Refill: 0

## 2018-08-31 NOTE — Patient Instructions (Addendum)
-   take tessalon perles for cough - take flonase for nasal congestion; if unable to tolerate nasal spray you can take guaifenesin (musinex) to help thing out your secretions.  - Drink plenty of water - Rest - good nutrition- get plenty of protein  - If unimproved in 2-3 days can start z-pack (antibiotic).   Try to use PLAIN allergy medicine without the decongestant Avoid: phenylephrine, phenylpropanolamine, and pseudoephredine  For Fever/Pain: Acetaminophen every 6 hours as needed (maximum of 3000mg  a day). If you are still uncomfortable you can add ibuprofen OR naproxen  For coughing: try dextromethorphan for a cough suppressant, and/or a cool mist humidifier, lozenges  For sore throat: saline gargles, honey herbal tea, lozenges, throat spray  To dry out your nose: try an antihistamine like loratadine (non-sedating) or diphenhydramine (sedating) or others To relieve a stuffy nose: try an oral decongestant - flonase, neti pot To make blowing your nose easier: guaifenesin       DASH stands for Dietary Approaches to Stop Hypertension. The DASH diet is a lifelong approach to healthy eating that's designed to help treat or prevent high blood pressure (hypertension). The DASH diet plan was developed to lower blood pressure without medication in research sponsored by the [employer]. The DASH diet encourages you to reduce the sodium in your diet and eat a variety of foods rich in nutrients that help lower blood pressure, such as potassium, calcium and magnesium. By following the DASH diet, you may be able to reduce your blood pressure by a few points in just two weeks. Over time, the top number of your blood pressure (systolic blood pressure) could drop by eight to 14 points, which can make a significant difference in your health risks. Because the DASH diet is a healthy way of eating, it offers health benefits besides just lowering blood pressure. The DASH diet is also in line with  dietary recommendations to prevent osteoporosis, cancer, heart disease, stroke and diabetes.  The DASH diet emphasizes vegetables, fruits and low-fat dairy foods - and moderate amounts of whole grains, fish, poultry and nuts. In addition to the standard DASH diet, there is also a lower sodium version of the diet. You can choose the version of the diet that meets your health needs: Standard DASH diet. You can consume up to 2,300 milligrams (mg) of sodium a day.  Lower sodium DASH diet. You can consume up to 1,500 mg of sodium a day.  Grains: 6 to 8 servings a day. Focus on Whole grains.  Vegetables: 4 to 5 servings a day  Fruits: 4 to 5 servings a day  Dairy: 2 to 3 servings a day  Lean meat, poultry and fish: 6 one-ounce servings or fewer a day  Nuts, seeds and legumes: 4 to 5 servings a week  Fats and oils: 2 to 3 servings a day  Sweets: 5 servings or fewer a week  Low alcohol and caffeine.

## 2018-09-04 ENCOUNTER — Ambulatory Visit
Admission: RE | Admit: 2018-09-04 | Discharge: 2018-09-04 | Disposition: A | Discharge status: Home / Self Care | Payer: Managed Care, Other (non HMO) | Source: Ambulatory Visit | Attending: Family Medicine | Admitting: Family Medicine

## 2018-09-04 DIAGNOSIS — Z1231 Encounter for screening mammogram for malignant neoplasm of breast: Secondary | ICD-10-CM

## 2018-09-21 ENCOUNTER — Encounter: Payer: Self-pay | Admitting: Nurse Practitioner

## 2018-09-21 ENCOUNTER — Ambulatory Visit (INDEPENDENT_AMBULATORY_CARE_PROVIDER_SITE_OTHER): Payer: Managed Care, Other (non HMO) | Admitting: Nurse Practitioner

## 2018-09-21 VITALS — BP 130/80 | HR 84 | Temp 98.2°F | Resp 16 | Ht 66.0 in | Wt 261.4 lb

## 2018-09-21 DIAGNOSIS — F419 Anxiety disorder, unspecified: Secondary | ICD-10-CM

## 2018-09-21 DIAGNOSIS — I10 Essential (primary) hypertension: Secondary | ICD-10-CM

## 2018-09-21 MED ORDER — LORAZEPAM 1 MG PO TABS: 0.5000 mg | ORAL_TABLET | Freq: Four times a day (QID) | ORAL | 0 refills | Status: DC | PRN

## 2018-09-21 NOTE — Patient Instructions (Addendum)
Your goal blood pressure is less than 140 mmHg on top and 90 on the bottom. Try to follow the DASH guidelines (DASH stands for Dietary Approaches to Stop Hypertension) Try to limit the sodium in your diet.  Ideally, consume less than 1.5 grams (less than 1,500mg ) per day. Do not add salt when cooking or at the table.  Check the sodium amount on labels when shopping, and choose items lower in sodium when given a choice. Avoid or limit foods that already contain a lot of sodium. Eat a diet rich in fruits and vegetables and whole grains.    Food Choices for Gastroesophageal Reflux Disease, Adult When you have gastroesophageal reflux disease (GERD), the foods you eat and your eating habits are very important. Choosing the right foods can help ease your discomfort. What guidelines do I need to follow?  Choose fruits, vegetables, whole grains, and low-fat dairy products.  Choose low-fat meat, fish, and poultry.  Limit fats such as oils, salad dressings, butter, nuts, and avocado.  Keep a food diary. This helps you identify foods that cause symptoms.  Avoid foods that cause symptoms. These may be different for everyone.  Eat small meals often instead of 3 large meals a day.  Eat your meals slowly, in a place where you are relaxed.  Limit fried foods.  Cook foods using methods other than frying.  Avoid drinking alcohol.  Avoid drinking large amounts of liquids with your meals.  Avoid bending over or lying down until 2-3 hours after eating. What foods are not recommended? These are some foods and drinks that may make your symptoms worse: Vegetables Tomatoes. Tomato juice. Tomato and spaghetti sauce. Chili peppers. Onion and garlic. Horseradish. Fruits Oranges, grapefruit, and lemon (fruit and juice). Meats High-fat meats, fish, and poultry. This includes hot dogs, ribs, ham, sausage, salami, and bacon. Dairy Whole milk and chocolate milk. Sour cream. Cream. Butter. Ice cream. Cream  cheese. Drinks Coffee and tea. Bubbly (carbonated) drinks or energy drinks. Condiments Hot sauce. Barbecue sauce. Sweets/Desserts Chocolate and cocoa. Donuts. Peppermint and spearmint. Fats and Oils High-fat foods. This includes [place] fries and potato chips. Other Vinegar. Strong spices. This includes black pepper, white pepper, red pepper, cayenne, curry powder, cloves, ginger, and chili powder. The items listed above may not be a complete list of foods and drinks to avoid. Contact your dietitian for more information. This information is not intended to replace advice given to you by your health care provider. Make sure you discuss any questions you have with your health care provider. Document Released: 06/02/2012 Document Revised: 05/09/2016 Document Reviewed: 10/06/2013 Elsevier Interactive Patient Education  2017 [employer].

## 2018-09-21 NOTE — Progress Notes (Addendum)
Name: Lori Oliver   MRN: [id number]    DOB: Oct 31, 1971   Date:09/21/2018       Progress Note  Subjective  Chief Complaint  Chief Complaint  Patient presents with  . Hypertension    HPI  Patient presents for follow-up of elevated blood pressure at last visit.  Patient is treated for hypertension with 12.5 mg of HCTZ daily.  Patient states she has a lot of stress and anxiety in her life right now.  Her husband is currently under chemotherapy for colon cancer, has appointment tomorrow to reevaluate how he is doing.  Patient states this stress has affected her blood pressure and increased GERD symptoms.  Patient takes her medications daily with no missed doses. BP Readings from Last 3 Encounters:  09/21/18 130/80  08/31/18 132/90  08/12/18 140/85   Panic  Takes Ativan 1-2 times every 2-3 weeks; before husbands cancer appointments.  Requesting another refill.    Patient Active Problem List   Diagnosis Date Noted  . Intermittent atrial fibrillation ([place]) 02/19/2018  . Hypokalemia 02/10/2018  . Anxiety about health 01/13/2018  . Atrial fibrillation with RVR ([place]) 12/01/2017  . Vitamin D deficiency 07/08/2017  . Lower back pain 07/07/2017  . Screen for colon cancer 07/07/2017  . Morbid obesity ([place]) 07/07/2017  . Essential hypertension 07/11/2016  . Preventative health care 05/16/2016  . IFG (impaired fasting glucose) 05/16/2016  . 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 Abnormal thyroid function test 05/16/2016  . Decreased libido 05/16/2016  . Breast cancer screening 05/16/2016  . Breast lump on left side at 5 o'clock position 05/16/2016  . Contraception management 05/16/2016  . Surveillance for birth control, oral contraceptives 02/12/2016  . Bilateral thoracic back pain 06/01/2015  . Blood pressure elevated without history of HTN 06/01/2015  . History of breast lump 06/01/2015  . History of abnormal cervical Pap smear 06/01/2015    Past Medical History:  Diagnosis  Date  . Abnormal thyroid function test 05/16/2016  . Anxiety   . Atrial fibrillation ([place])   . 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 Decreased libido 05/16/2016  . Dysrhythmia   . Hypertension   . Hypokalemia   . Impaired fasting glucose   . Morbid obesity ([place]) 06/01/2015  . Morbid obesity ([place])     Past Surgical History:  Procedure Laterality Date  . BREAST BIOPSY Right 2015   benign  . ESOPHAGOGASTRODUODENOSCOPY (EGD) WITH PROPOFOL N/A 08/12/2018   Procedure: ESOPHAGOGASTRODUODENOSCOPY (EGD) WITH PROPOFOL;  Surgeon: Toledo, Benay Pike, MD;  Location: [facility] ENDOSCOPY;  Service: Gastroenterology;  Laterality: N/A;  . HAMMER TOE SURGERY      Social History   Tobacco Use  . Smoking status: Never Smoker  . Smokeless tobacco: Never Used  Substance Use Topics  . Alcohol use: Yes    Alcohol/week: 0.0 standard drinks    Frequency: Never    Comment: occ     Current Outpatient Medications:  .  benzonatate (TESSALON PERLES) 100 MG capsule, Take 2 capsules (200 mg total) by mouth 2 (two) times daily as needed for cough., Disp: 30 capsule, Rfl: 0 .  hydrochlorothiazide (MICROZIDE) 12.5 MG capsule, Take 1 capsule (12.5 mg total) by mouth daily., Disp: 30 capsule, Rfl: 2 .  LORazepam (ATIVAN) 1 MG tablet, Take 0.5-1 tablets (0.5-1 mg total) by mouth every 6 (six) hours as needed for anxiety., Disp: 15 tablet, Rfl: 0 .  pantoprazole (PROTONIX) 40 MG tablet, Take 1 tablet (40 mg total) by mouth daily., Disp:  30 tablet, Rfl: 1 .  propranolol (INDERAL) 20 MG tablet, Take 1 tablet (20 mg total) by mouth 3 (three) times daily as needed., Disp: 90 tablet, Rfl: 3 .  azithromycin (ZITHROMAX) 250 MG tablet, 2 tablets on the first day, and one tablet for the next 4 days. (Patient not taking: Reported on 09/21/2018), Disp: 6 tablet, Rfl: 0 .  fluticasone (FLONASE) 50 MCG/ACT nasal spray, Place 2 sprays into both nostrils daily. (Patient not taking: Reported on 09/21/2018), Disp: 16 g, Rfl: 6 .  traMADol  (ULTRAM) 50 MG tablet, Take by mouth every 6 (six) hours as needed., Disp: , Rfl:   No Known Allergies  Review of Systems  Constitutional: Negative for chills and fever.  Eyes: Negative for blurred vision and double vision.  Respiratory: Negative for cough and shortness of breath.   Cardiovascular: Negative for chest pain and palpitations.  Gastrointestinal: Positive for heartburn.  Musculoskeletal: Negative for myalgias.  Neurological: Negative for dizziness, weakness and headaches.  Psychiatric/Behavioral: The patient is nervous/anxious.      No other specific complaints in a complete review of systems (except as listed in HPI above).  Objective  Vitals:   09/21/18 1502  BP: 130/80  Pulse: 84  Resp: 16  Temp: 98.2 F (36.8 C)  TempSrc: Oral  SpO2: 98%  Weight: 261 lb 6.4 oz (118.6 kg)  Height: 5\' 6"  (1.676 m)    Body mass index is 42.19 kg/m.  Nursing Note and Vital Signs reviewed.  Physical Exam  Constitutional: Patient appears well-developed and well-nourished. Obese, no acute distress.  HEENT: head atraumatic, normocephalic, neck supple,  Cardiovascular: Normal rate, regular rhythm and normal heart sounds.  No murmur heard.  Pulmonary/Chest: Effort normal and breath sounds normal. No respiratory distress. Abdominal: Soft.  There is no tenderness. Psychiatric: Patient has a normal mood and affect. behavior is normal. Judgment and thought content normal.   No results found for this or any previous visit (from the past 48 hour(s)).  Assessment & Plan 1. Essential hypertension Controlled, discussed DASH, continue medications  2. Anxiety [place] consulted; last controlled substance prescribed bwas ativan 15 pills 06/19/2018. Discussed patient trying daily anxiety medication to help overall anxiety, patient resistant to this idea initially, practitioner explained the way medications would work.  Patient will let us know if she would like to try this.   Patient has counseling services offered at work and is trying to find a cancer support group for herself - LORazepam (ATIVAN) 1 MG tablet; Take 0.5-1 tablets (0.5-1 mg total) by mouth every 6 (six) hours as needed for anxiety.  Dispense: 15 tablet; Refill: 0

## 2018-11-30 ENCOUNTER — Ambulatory Visit (INDEPENDENT_AMBULATORY_CARE_PROVIDER_SITE_OTHER): Payer: Managed Care, Other (non HMO) | Admitting: Nurse Practitioner

## 2018-11-30 ENCOUNTER — Encounter: Payer: Self-pay | Admitting: Nurse Practitioner

## 2018-11-30 ENCOUNTER — Encounter: Payer: Managed Care, Other (non HMO) | Admitting: Family Medicine

## 2018-11-30 VITALS — BP 128/74 | HR 96 | Temp 98.4°F | Resp 14 | Ht 65.0 in | Wt 268.9 lb

## 2018-11-30 DIAGNOSIS — Z131 Encounter for screening for diabetes mellitus: Secondary | ICD-10-CM | POA: Diagnosis not present

## 2018-11-30 DIAGNOSIS — Z0000 Encounter for general adult medical examination without abnormal findings: Secondary | ICD-10-CM

## 2018-11-30 DIAGNOSIS — Z13220 Encounter for screening for lipoid disorders: Secondary | ICD-10-CM | POA: Diagnosis not present

## 2018-11-30 DIAGNOSIS — R7301 Impaired fasting glucose: Secondary | ICD-10-CM

## 2018-11-30 DIAGNOSIS — Z23 Encounter for immunization: Secondary | ICD-10-CM

## 2018-11-30 DIAGNOSIS — Z1239 Encounter for other screening for malignant neoplasm of breast: Secondary | ICD-10-CM

## 2018-11-30 NOTE — Progress Notes (Signed)
Name: Lori Oliver   MRN: [id number]    DOB: 1971/03/08   Date:11/30/2018       Progress Note  Subjective  Chief Complaint  Chief Complaint  Patient presents with  . Annual Exam    last pap normal 2017    HPI  Patient presents for annual CPE.  Diet:  Eats vegetables, eating less food, avoids sweets and fried foods.  At least servings of fruits and vegetables a dya 2-3 times a day, occasional snacking.  Eats breakfast  Drinks water. One soda a day  Stopped depo- last shot was around June; hasn't had period since then.   Exercise: not really exercising due pain- has plantar fasciitis and knee pain; is very active at work.   USPSTF grade A and B recommendations    Office Visit from 11/30/2018 in [facility]  AUDIT-C Score  1     Depression:  Depression screen [facility] 2/9 11/30/2018 04/03/2018 03/03/2018 01/13/2018 01/13/2018  Decreased Interest 0 0 0 0 1  Down, Depressed, Hopeless 1 1 1 1 1   PHQ - 2 Score 1 1 1 1 2   Altered sleeping 0 - 0 0 -  Tired, decreased energy 0 - 0 1 -  Change in appetite 0 - 1 1 -  Feeling bad or failure about yourself  3 - 1 0 -  Trouble concentrating 0 - 1 0 -  Moving slowly or fidgety/restless 0 - 0 0 -  Suicidal thoughts 0 - 0 0 -  PHQ-9 Score 4 - 4 3 -  Difficult doing work/chores - - Somewhat difficult Somewhat difficult -   Hypertension: BP Readings from Last 3 Encounters:  11/30/18 128/74  09/21/18 130/80  08/31/18 132/90   Obesity: Wt Readings from Last 3 Encounters:  11/30/18 268 lb 14.4 oz (122 kg)  09/21/18 261 lb 6.4 oz (118.6 kg)  08/31/18 262 lb 12.8 oz (119.2 kg)   BMI Readings from Last 3 Encounters:  11/30/18 44.75 kg/m  09/21/18 42.19 kg/m  08/31/18 42.42 kg/m    Hep C Screening: declines  STD testing and prevention (HIV/chl/gon/syphilis): declines  Intimate partner violence declines  Sexual History/Pain during Intercourse: declines  Menstrual History/LMP/Abnormal Bleeding: was on depo for  6 months last shot was in June hasn't had period since then.  Incontinence Symptoms: denies.   Advanced Care Planning: A voluntary discussion about advance care planning including the explanation and discussion of advance directives.  Discussed health care proxy and Living will, and the patient was able to identify a health care proxy as husband Anija Brickner; then daughter Jerrell Belfast.  Patient does not have a living will at present time. If patient does have living will, I have requested they bring this to the clinic to be scanned in to their chart.  Breast cancer: 09/07/2018-  IMPRESSION: No mammographic evidence of malignancy. A result letter of this screening mammogram will be mailed directly to the patient.  Cervical cancer screening: 05/17/2016 last pap was negative   Lipids:  Lab Results  Component Value Date   CHOL 177 07/07/2017   CHOL 167 05/17/2016   CHOL 165 03/09/2014   Lab Results  Component Value Date   HDL 49 07/07/2017   HDL 68 05/17/2016   HDL 60 03/09/2014   Lab Results  Component Value Date   LDLCALC 101 (H) 07/07/2017   LDLCALC 83 05/17/2016   LDLCALC 86 03/09/2014   Lab Results  Component Value Date   TRIG 134 07/07/2017  TRIG 82 05/17/2016   TRIG 96 03/09/2014   Lab Results  Component Value Date   CHOLHDL 3.6 07/07/2017   No results found for: LDLDIRECT  Glucose:  Glucose  Date Value Ref Range Status  03/09/2014 86 65 - 99 mg/dL Final   Glucose, Bld  Date Value Ref Range Status  06/04/2018 97 65 - 99 mg/dL Final  03/27/2018 110 (H) 65 - 99 mg/dL Final  02/11/2018 100 (H) 65 - 99 mg/dL Final   Glucose-Capillary  Date Value Ref Range Status  12/01/2017 126 (H) 65 - 99 mg/dL Final    Skin cancer: not out in the sun a lot, doesn't wear sun screen, no cncerning lesions- skin tag under breastk  Colorectal cancer: no blood in stools; states since taking vitamin supplements having 2 bowel movements a day- no straining not watery.  Lung  cancer: non smoker.    Patient Active Problem List   Diagnosis Date Noted  . Intermittent atrial fibrillation ([place]) 02/19/2018  . Hypokalemia 02/10/2018  . Anxiety about health 01/13/2018  . Atrial fibrillation with RVR ([place]) 12/01/2017  . Vitamin D deficiency 07/08/2017  . Lower back pain 07/07/2017  . Screen for colon cancer 07/07/2017  . Morbid obesity ([place]) 07/07/2017  . Essential hypertension 07/11/2016  . Preventative health care 05/16/2016  . IFG (impaired fasting glucose) 05/16/2016  . 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 Abnormal thyroid function test 05/16/2016  . Decreased libido 05/16/2016  . Breast cancer screening 05/16/2016  . Breast lump on left side at 5 o'clock position 05/16/2016  . Contraception management 05/16/2016  . Surveillance for birth control, oral contraceptives 02/12/2016  . Bilateral thoracic back pain 06/01/2015  . Blood pressure elevated without history of HTN 06/01/2015  . History of breast lump 06/01/2015  . History of abnormal cervical Pap smear 06/01/2015    Past Surgical History:  Procedure Laterality Date  . BREAST BIOPSY Right 2015   benign  . ESOPHAGOGASTRODUODENOSCOPY (EGD) WITH PROPOFOL N/A 08/12/2018   Procedure: ESOPHAGOGASTRODUODENOSCOPY (EGD) WITH PROPOFOL;  Surgeon: Toledo, Benay Pike, MD;  Location: [facility] ENDOSCOPY;  Service: Gastroenterology;  Laterality: N/A;  . HAMMER TOE SURGERY      Family History  Problem Relation Age of Onset  . Hypertension Mother   . Heart disease Mother   . Arthritis Mother   . Depression Mother   . Stroke Maternal Grandmother     Social History   Socioeconomic History  . Marital status: Married    Spouse name: Coralyn Mark  . Number of children: 1  . Years of education: 46  . Highest education level: Some college, no degree  Occupational History  . Not on file  Social Needs  . Financial resource strain: Not hard at all  . Food insecurity:    Worry: Never true    Inability: Never true   . Transportation needs:    Medical: No    Non-medical: No  Tobacco Use  . Smoking status: Never Smoker  . Smokeless tobacco: Never Used  Substance and Sexual Activity  . Alcohol use: Yes    Alcohol/week: 0.0 standard drinks    Frequency: Never    Comment: occ  . Drug use: No  . Sexual activity: Yes    Partners: Male    Birth control/protection: Implant, None  Lifestyle  . Physical activity:    Days per week: 0 days    Minutes per session: 0 min  . Stress: Not at all  Relationships  . Social connections:  Talks on phone: Once a week    Gets together: Once a week    Attends religious service: More than 4 times per year    Active member of club or organization: Yes    Attends meetings of clubs or organizations: More than 4 times per year    Relationship status: Married  . Intimate partner violence:    Fear of current or ex partner: No    Emotionally abused: No    Physically abused: No    Forced sexual activity: No  Other Topics Concern  . Not on file  Social History Narrative  . Not on file     Current Outpatient Medications:  .  hydrochlorothiazide (MICROZIDE) 12.5 MG capsule, Take 1 capsule (12.5 mg total) by mouth daily., Disp: 30 capsule, Rfl: 2 .  traMADol (ULTRAM) 50 MG tablet, Take by mouth every 6 (six) hours as needed., Disp: , Rfl:   No Known Allergies   Review of Systems  Constitutional: Negative for chills and fever.  HENT: Negative for congestion, sinus pain and tinnitus.   Eyes: Negative for blurred vision and double vision.       Wears glasses; sees eye doctor a year and half ago  Respiratory: Positive for cough. Negative for wheezing.   Cardiovascular: Negative for chest pain, palpitations and leg swelling.  Gastrointestinal: Negative for abdominal pain, blood in stool, constipation, diarrhea, heartburn, nausea and vomiting.  Genitourinary: Negative for dysuria and hematuria.  Musculoskeletal: Positive for back pain (intermittent ). Negative  for falls and joint pain.  Skin: Negative for rash.  Neurological: Negative for dizziness, tingling, weakness and headaches.  Psychiatric/Behavioral: Negative for suicidal ideas. The patient is not nervous/anxious (much improved; has been going to counseling).      Objective  Vitals:   11/30/18 0937  BP: 128/74  Pulse: 96  Resp: 14  Temp: 98.4 F (36.9 C)  TempSrc: Oral  SpO2: 99%  Weight: 268 lb 14.4 oz (122 kg)  Height: 5\' 5"  (1.651 m)    Body mass index is 44.75 kg/m.  Physical Exam  Constitutional: Patient appears well-developed and well-nourished. No distress.  HENT: Head: Normocephalic and atraumatic. Ears: B TMs ok, no erythema or effusion; Nose: Nose normal. Mouth/Throat: Oropharynx is clear and moist. No oropharyngeal exudate.  Eyes: Conjunctivae and EOM are normal. Pupils are equal, round, and reactive to light. No scleral icterus.  Neck: Normal range of motion. Neck supple. No JVD present. No thyromegaly present.  Cardiovascular: Normal rate, regular rhythm and normal heart sounds.  No murmur heard. No BLE edema. Pulmonary/Chest: Effort normal and breath sounds normal. No respiratory distress. Abdominal: Soft. Bowel sounds are normal, no distension. There is no tenderness. no masses Breast: no lumps or masses, no nipple discharge or rashes FEMALE GENITALIA:  deferred Musculoskeletal: Normal range of motion, no joint effusions. No gross deformities Neurological: he is alert and oriented to person, place, and time. No cranial nerve deficit. Coordination, balance, strength, speech and gait are normal.  Skin: Skin is warm and dry. No rash noted. No erythema. skin tag noted under left breast Psychiatric: Patient has a normal mood and affect. behavior is normal. Judgment and thought content normal.   No results found for this or any previous visit (from the past 2160 hour(s)).   PHQ2/9: Depression screen [facility] 2/9 11/30/2018 04/03/2018 03/03/2018 01/13/2018 01/13/2018   Decreased Interest 0 0 0 0 1  Down, Depressed, Hopeless 1 1 1 1 1   PHQ - 2 Score 1 1 1  1 2  Altered sleeping 0 - 0 0 -  Tired, decreased energy 0 - 0 1 -  Change in appetite 0 - 1 1 -  Feeling bad or failure about yourself  3 - 1 0 -  Trouble concentrating 0 - 1 0 -  Moving slowly or fidgety/restless 0 - 0 0 -  Suicidal thoughts 0 - 0 0 -  PHQ-9 Score 4 - 4 3 -  Difficult doing work/chores - - Somewhat difficult Somewhat difficult -    Fall Risk: Fall Risk  11/30/2018 09/21/2018 09/21/2018 08/31/2018 08/31/2018  Falls in the past year? 1 No No No No  Number falls in past yr: 0 - - - -  Injury with Fall? 1 - - - -  Comment - - - - -    Functional Status Survey: Is the patient deaf or have difficulty hearing?: No Does the patient have difficulty seeing, even when wearing glasses/contacts?: Yes Does the patient have difficulty concentrating, remembering, or making decisions?: No Does the patient have difficulty walking or climbing stairs?: No Does the patient have difficulty dressing or bathing?: No Does the patient have difficulty doing errands alone such as visiting a doctor's office or shopping?: No   Assessment & Plan 1. Preventative health care Discussed perimenopause, normal BMs, skin cancer prevention and concerning moles. She can make another appointment for skin tag removal. Discussed diet and exercise.  - COMPLETE METABOLIC PANEL WITH GFR - Lipid Profile - HgB A1c  2. Need for Tdap vaccination - Tdap vaccine greater than or equal to 7yo IM  3. Screening for lipid disorders - Lipid Profile  4. Screening for diabetes mellitus - HgB A1c  5. IFG (impaired fasting glucose) - HgB A1c  6. Screening for breast cancer - MM Digital Screening; Future   -USPSTF grade A and B recommendations reviewed with patient; age-appropriate recommendations, preventive care, screening tests, etc discussed and encouraged; healthy living encouraged; see AVS for patient education given  to patient -Discussed importance of 150 minutes of physical activity weekly, eat two servings of fish weekly, eat one serving of tree nuts ( cashews, pistachios, pecans, almonds.Marland Kitchen) every other day, eat 6 servings of fruit/vegetables daily and drink plenty of water and avoid sweet beverages.

## 2018-11-30 NOTE — Patient Instructions (Addendum)
- Please come in fasting sometime this week (Thursday) for labs only visit.  - You can also schedule an appointment for removal of skin tag   General recommendations: 150 minutes of physical activity weekly, eat two servings of fish weekly, eat one serving of tree nuts ( cashews, pistachios, pecans, almonds.Marland Kitchen) every other day, eat 6 servings of fruit/vegetables daily and drink plenty of water and avoid sweet beverages.   Perimenopause Perimenopause is the time when your body begins to move into the menopause (no menstrual period for 12 straight months). It is a natural process. Perimenopause can begin 2-8 years before the menopause and usually lasts for 1 year after the menopause. During this time, your ovaries may or may not produce an egg. The ovaries vary in their production of estrogen and progesterone hormones each month. This can cause irregular menstrual periods, difficulty getting pregnant, vaginal bleeding between periods, and uncomfortable symptoms. What are the causes?  Irregular production of the ovarian hormones, estrogen and progesterone, and not ovulating every month. Other causes include:  Tumor of the pituitary gland in the brain.  Medical disease that affects the ovaries.  Radiation treatment.  Chemotherapy.  Unknown causes.  Heavy smoking and excessive alcohol intake can bring on perimenopause sooner.  What are the signs or symptoms?  Hot flashes.  Night sweats.  Irregular menstrual periods.  Decreased sex drive.  Vaginal dryness.  Headaches.  Mood swings.  Depression.  Memory problems.  Irritability.  Tiredness.  Weight gain.  Trouble getting pregnant.  The beginning of losing bone cells (osteoporosis).  The beginning of hardening of the arteries (atherosclerosis). How is this diagnosed? Your health care provider will make a diagnosis by analyzing your age, menstrual history, and symptoms. He or she will do a physical exam and note any  changes in your body, especially your female organs. Female hormone tests may or may not be helpful depending on the amount of female hormones you produce and when you produce them. However, other hormone tests may be helpful to rule out other problems. How is this treated? In some cases, no treatment is needed. The decision on whether treatment is necessary during the perimenopause should be made by you and your health care provider based on how the symptoms are affecting you and your lifestyle. Various treatments are available, such as:  Treating individual symptoms with a specific medicine for that symptom.  Herbal medicines that can help specific symptoms.  Counseling.  Group therapy.  Follow these instructions at home:  Keep track of your menstrual periods (when they occur, how heavy they are, how long between periods, and how long they last) as well as your symptoms and when they started.  Only take over-the-counter or prescription medicines as directed by your health care provider.  Sleep and rest.  Exercise.  Eat a diet that contains calcium (good for your bones) and soy (acts like the estrogen hormone).  Do not smoke.  Avoid alcoholic beverages.  Take vitamin supplements as recommended by your health care provider. Taking vitamin E may help in certain cases.  Take calcium and vitamin D supplements to help prevent bone loss.  Group therapy is sometimes helpful.  Acupuncture may help in some cases. Contact a health care provider if:  You have questions about any symptoms you are having.  You need a referral to a specialist (gynecologist, psychiatrist, or psychologist). Get help right away if:  You have vaginal bleeding.  Your period lasts longer than 8 days.  Your  periods are recurring sooner than 21 days.  You have bleeding after intercourse.  You have severe depression.  You have pain when you urinate.  You have severe headaches.  You have vision  problems. This information is not intended to replace advice given to you by your health care provider. Make sure you discuss any questions you have with your health care provider. Document Released: 01/09/2005 Document Revised: 05/09/2016 Document Reviewed: 07/01/2013 Elsevier Interactive Patient Education  2017 [employer].

## 2018-12-04 ENCOUNTER — Ambulatory Visit: Payer: Managed Care, Other (non HMO) | Admitting: Nurse Practitioner

## 2018-12-14 ENCOUNTER — Other Ambulatory Visit: Payer: Self-pay | Admitting: Family Medicine

## 2018-12-14 DIAGNOSIS — Z0000 Encounter for general adult medical examination without abnormal findings: Secondary | ICD-10-CM

## 2018-12-14 DIAGNOSIS — R5383 Other fatigue: Secondary | ICD-10-CM

## 2018-12-14 DIAGNOSIS — N912 Amenorrhea, unspecified: Secondary | ICD-10-CM

## 2018-12-15 LAB — COMPLETE METABOLIC PANEL WITH GFR
AG RATIO: 1.2 (calc) (ref 1.0–2.5)
ALBUMIN MSPROF: 3.7 g/dL (ref 3.6–5.1)
ALT: 15 U/L (ref 6–29)
AST: 16 U/L (ref 10–35)
Alkaline phosphatase (APISO): 70 U/L (ref 33–115)
BUN: 14 mg/dL (ref 7–25)
CALCIUM: 9.3 mg/dL (ref 8.6–10.2)
CO2: 27 mmol/L (ref 20–32)
CREATININE: 0.94 mg/dL (ref 0.50–1.10)
Chloride: 103 mmol/L (ref 98–110)
GFR, EST AFRICAN AMERICAN: 84 mL/min/{1.73_m2} (ref >=60)
GFR, EST NON AFRICAN AMERICAN: 73 mL/min/{1.73_m2} (ref >=60)
Globulin: 3.2 g/dL (calc) (ref 1.9–3.7)
Glucose, Bld: 88 mg/dL (ref 65–99)
Potassium: 4.2 mmol/L (ref 3.5–5.3)
SODIUM: 138 mmol/L (ref 135–146)
TOTAL PROTEIN: 6.9 g/dL (ref 6.1–8.1)
Total Bilirubin: 0.5 mg/dL (ref 0.2–1.2)

## 2018-12-15 LAB — LIPID PANEL
CHOL/HDL RATIO: 3.5 (calc) (ref <5.0)
Cholesterol: 162 mg/dL (ref <200)
HDL: 46 mg/dL — AB (ref >50)
LDL Cholesterol (Calc): 100 mg/dL (calc) — ABNORMAL HIGH
NON-HDL CHOLESTEROL (CALC): 116 mg/dL (ref <130)
Triglycerides: 69 mg/dL (ref <150)

## 2018-12-15 LAB — VITAMIN B12: Vitamin B-12: 659 pg/mL (ref 200–1100)

## 2018-12-15 LAB — HEMOGLOBIN A1C
EAG (MMOL/L): 6.5 (calc)
HEMOGLOBIN A1C: 5.7 %{Hb} — AB (ref <5.7)
MEAN PLASMA GLUCOSE: 117 (calc)

## 2018-12-15 LAB — FSH/LH
FSH: 26.3 m[IU]/mL
LH: 17.9 m[IU]/mL

## 2018-12-15 LAB — TSH: TSH: 1.78 mIU/L

## 2018-12-17 ENCOUNTER — Ambulatory Visit: Payer: Self-pay | Admitting: *Deleted

## 2018-12-17 NOTE — Telephone Encounter (Signed)
Patient called to say that she received test results on My Chart and she has questions on things that she doesn't understand. Please advise Ph# [phone]  Patient wants to verify that she is truly menopausal- due to lab reports and provider report- she is.Patient is very surprised- discuss her symptoms and if she feels she need help with that- she will call.  Patient would like to try diet and exercise to lower her A1c. She would consider  A medication if that does not work. Encouraged patient to follow good advise given.  Patient is still experiencing sinus drainage and phlegm. Mucinex is really not that helpful- patient is wanting recommendation. Maybe an allergy medication would work better.  Reason for Disposition . [1] Caller requesting NON-URGENT health information AND [2] PCP's office is the best resource  Protocols used: INFORMATION ONLY CALL-A-AH

## 2018-12-17 NOTE — Telephone Encounter (Signed)
Tried to call patient back left detailed voicemail, also told her she could reach out through my chart message

## 2018-12-19 ENCOUNTER — Emergency Department: Payer: Managed Care, Other (non HMO)

## 2018-12-19 ENCOUNTER — Emergency Department
Admission: EM | Admit: 2018-12-19 | Discharge: 2018-12-19 | Disposition: A | Discharge status: Home / Self Care | Payer: Managed Care, Other (non HMO) | Attending: Emergency Medicine | Admitting: Emergency Medicine

## 2018-12-19 ENCOUNTER — Encounter: Payer: Self-pay | Admitting: Emergency Medicine

## 2018-12-19 ENCOUNTER — Other Ambulatory Visit: Payer: Self-pay

## 2018-12-19 DIAGNOSIS — J9801 Acute bronchospasm: Secondary | ICD-10-CM | POA: Diagnosis not present

## 2018-12-19 DIAGNOSIS — Z79899 Other long term (current) drug therapy: Secondary | ICD-10-CM | POA: Insufficient documentation

## 2018-12-19 DIAGNOSIS — F419 Anxiety disorder, unspecified: Secondary | ICD-10-CM | POA: Insufficient documentation

## 2018-12-19 DIAGNOSIS — I10 Essential (primary) hypertension: Secondary | ICD-10-CM | POA: Diagnosis not present

## 2018-12-19 DIAGNOSIS — R05 Cough: Secondary | ICD-10-CM | POA: Diagnosis present

## 2018-12-19 MED ORDER — HYDROCOD POLST-CPM POLST ER 10-8 MG/5ML PO SUER: 5.0000 mL | Freq: Two times a day (BID) | ORAL | 0 refills | Status: DC

## 2018-12-19 MED ORDER — HYDROCOD POLST-CPM POLST ER 10-8 MG/5ML PO SUER
5.0000 mL | Freq: Once | ORAL | Status: AC
  Administered 2018-12-19: 5 mL via ORAL
  Filled 2018-12-19: qty 5

## 2018-12-19 NOTE — ED Notes (Signed)
Patient transported to X-ray 

## 2018-12-19 NOTE — Discharge Instructions (Signed)
Your chest x-ray is unremarkable. Tessalon Perles are not controlling your cough.  Advised to start Tussionex to be aware just effect this medication.  Follow-up with PCP in 3 days with no improvement.

## 2018-12-19 NOTE — ED Triage Notes (Signed)
Cough and body aches x 1 week

## 2018-12-19 NOTE — ED Provider Notes (Signed)
[facility] Emergency Department Provider Note   ____________________________________________   First MD Initiated Contact with Patient 12/19/18 [phone]     (approximate)  I have reviewed the triage vital signs and the nursing notes.   HISTORY  Chief Complaint Cough    HPI Lori Oliver is a 48 y.o. female patient complain of cough and body aches for 1 week.  Patient states cough is productive.  Patient stated this morning she noticed blood-tinged cough.  Patient also complained of body aches.  Patient has taken flu shot for this season.  Patient denies fever but states she has chills.  Patient denies nasal congestion or runny nose.  Patient rates her pain discomfort as 8/10.  Patient  states no relief using Tessalon Perles. Past Medical History:  Diagnosis Date  . Abnormal thyroid function test 05/16/2016  . Anxiety   . Atrial fibrillation ([place])   . 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 Decreased libido 05/16/2016  . Dysrhythmia   . Hypertension   . Hypokalemia   . Impaired fasting glucose   . Morbid obesity ([place]) 06/01/2015  . Morbid obesity ([place])   . Plantar fasciitis     Patient Active Problem List   Diagnosis Date Noted  . Intermittent atrial fibrillation ([place]) 02/19/2018  . Hypokalemia 02/10/2018  . Anxiety about health 01/13/2018  . Atrial fibrillation with RVR ([place]) 12/01/2017  . Vitamin D deficiency 07/08/2017  . Lower back pain 07/07/2017  . Screen for colon cancer 07/07/2017  . Morbid obesity ([place]) 07/07/2017  . Essential hypertension 07/11/2016  . Preventative health care 05/16/2016  . IFG (impaired fasting glucose) 05/16/2016  . 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 Abnormal thyroid function test 05/16/2016  . Decreased libido 05/16/2016  . Breast cancer screening 05/16/2016  . Breast lump on left side at 5 o'clock position 05/16/2016  . Contraception management 05/16/2016  . Surveillance for birth control, oral  contraceptives 02/12/2016  . Bilateral thoracic back pain 06/01/2015  . Blood pressure elevated without history of HTN 06/01/2015  . History of breast lump 06/01/2015  . History of abnormal cervical Pap smear 06/01/2015    Past Surgical History:  Procedure Laterality Date  . BREAST BIOPSY Right 2015   benign  . ESOPHAGOGASTRODUODENOSCOPY (EGD) WITH PROPOFOL N/A 08/12/2018   Procedure: ESOPHAGOGASTRODUODENOSCOPY (EGD) WITH PROPOFOL;  Surgeon: Toledo, Benay Pike, MD;  Location: [facility] ENDOSCOPY;  Service: Gastroenterology;  Laterality: N/A;  . HAMMER TOE SURGERY      Prior to Admission medications   Medication Sig Start Date End Date Taking? Authorizing Provider  chlorpheniramine-HYDROcodone (TUSSIONEX PENNKINETIC ER) 10-8 MG/5ML SUER Take 5 mLs by mouth 2 (two) times daily. 12/19/18   Sable Feil, PA-C  hydrochlorothiazide (MICROZIDE) 12.5 MG capsule Take 1 capsule (12.5 mg total) by mouth daily. 08/31/18   Poulose, Bethel Born, NP  traMADol (ULTRAM) 50 MG tablet Take by mouth every 6 (six) hours as needed.    [provider]    Allergies Patient has no known allergies.  Family History  Problem Relation Age of Onset  . Hypertension Mother   . Heart disease Mother   . Arthritis Mother   . Depression Mother   . Stroke Maternal Grandmother     Social History Social History   Tobacco Use  . Smoking status: Never Smoker  . Smokeless tobacco: Never Used  Substance Use Topics  . Alcohol use: Yes    Alcohol/week: 0.0 standard drinks    Frequency: Never  Comment: occ  . Drug use: No    Review of Systems Constitutional: No fever/chills Eyes: No visual changes. ENT: No sore throat. Cardiovascular: Denies chest pain. Respiratory: Denies shortness of breath.  Productive cough Gastrointestinal: No abdominal pain.  No nausea, no vomiting.  No diarrhea.  No constipation. Genitourinary: Negative for dysuria. Musculoskeletal: Negative for back pain. Skin: Negative for  rash. Neurological: Negative for headaches, focal weakness or numbness. Psychiatric:Anxiety Endocrine:Hypertension   ____________________________________________   PHYSICAL EXAM:  VITAL SIGNS: ED Triage Vitals  Enc Vitals Group     BP 12/19/18 0656 (!) 153/93     Pulse Rate 12/19/18 0656 78     Resp 12/19/18 0656 18     Temp 12/19/18 0656 98.8 F (37.1 C)     Temp Source 12/19/18 0656 Oral     SpO2 12/19/18 0656 99 %     Weight 12/19/18 0658 260 lb (117.9 kg)     Height 12/19/18 0658 5\' 5"  (1.651 m)     Head Circumference --      Peak Flow --      Pain Score 12/19/18 0658 8     Pain Loc --      Pain Edu? --      Excl. in [place]? --     Constitutional: Alert and oriented. Well appearing and in no acute distress.  Morbid obesity. Eyes: Conjunctivae are normal. PERRL. EOMI. Head: Atraumatic. Nose: No congestion/rhinnorhea. Mouth/Throat: Mucous membranes are moist.  Oropharynx non-erythematous. Neck: No stridor.  Cardiovascular: Normal rate, regular rhythm. Grossly normal heart sounds.  Good peripheral circulation.  Elevated blood pressure Respiratory: Normal respiratory effort.  No retractions. Lungs CTAB. Musculoskeletal: No lower extremity tenderness nor edema.  No joint effusions. Neurologic:  Normal speech and language. No gross focal neurologic deficits are appreciated. No gait instability. Skin:  Skin is warm, dry and intact. No rash noted. Psychiatric: Mood and affect are normal. Speech and behavior are normal.  ____________________________________________   LABS (all labs ordered are listed, but only abnormal results are displayed)  Labs Reviewed - No data to display ____________________________________________  EKG   ____________________________________________  RADIOLOGY  ED MD interpretation:    Official radiology report(s): Dg Chest 2 View  Result Date: 12/19/2018 CLINICAL DATA:  Productive cough 2 weeks. EXAM: CHEST - 2 VIEW COMPARISON:   06/04/2018 FINDINGS: Lungs are adequately inflated and otherwise clear. Cardiomediastinal silhouette and remainder of the exam is unchanged. IMPRESSION: No active cardiopulmonary disease. Electronically Signed   By: Marin Olp M.D.   On: 12/19/2018 07:34    ____________________________________________   PROCEDURES  Procedure(s) performed: None  Procedures  Critical Care performed: No  ____________________________________________   INITIAL IMPRESSION / ASSESSMENT AND PLAN / ED COURSE  As part of my medical decision making, I reviewed the following data within the [place]    Patient presents 1 week of cough.  Discussed negative chest x-ray findings with patient.  Advised to continue using previous medication and start Tussionex twice a day as directed.  Follow-up PCP in 3 days if no improvement.      ____________________________________________   FINAL CLINICAL IMPRESSION(S) / ED DIAGNOSES  Final diagnoses:  Cough due to bronchospasm     ED Discharge Orders         Ordered    chlorpheniramine-HYDROcodone (TUSSIONEX PENNKINETIC ER) 10-8 MG/5ML SUER  2 times daily     12/19/18 0757           Note:  This document was  prepared using Systems analyst and may include unintentional dictation errors.    Sable Feil, PA-C 12/19/18 0800    Arta Silence, MD 12/19/18 [phone]

## 2018-12-25 ENCOUNTER — Encounter: Payer: Self-pay | Admitting: Nurse Practitioner

## 2018-12-25 ENCOUNTER — Ambulatory Visit (INDEPENDENT_AMBULATORY_CARE_PROVIDER_SITE_OTHER): Payer: Managed Care, Other (non HMO) | Admitting: Nurse Practitioner

## 2018-12-25 VITALS — BP 136/82 | HR 90 | Temp 98.2°F | Resp 18 | Ht 66.0 in | Wt 274.4 lb

## 2018-12-25 DIAGNOSIS — R0981 Nasal congestion: Secondary | ICD-10-CM

## 2018-12-25 DIAGNOSIS — R05 Cough: Secondary | ICD-10-CM

## 2018-12-25 DIAGNOSIS — H6122 Impacted cerumen, left ear: Secondary | ICD-10-CM

## 2018-12-25 DIAGNOSIS — R059 Cough, unspecified: Secondary | ICD-10-CM

## 2018-12-25 MED ORDER — AZITHROMYCIN 250 MG PO TABS: ORAL_TABLET | ORAL | 0 refills | Status: DC

## 2018-12-25 MED ORDER — FLUTICASONE PROPIONATE 50 MCG/ACT NA SUSP: 2.0000 | Freq: Every day | NASAL | 0 refills | Status: DC

## 2018-12-25 MED ORDER — LORATADINE 10 MG PO TABS: 10.0000 mg | ORAL_TABLET | Freq: Every day | ORAL | 0 refills | Status: DC

## 2018-12-25 MED ORDER — BENZONATATE 200 MG PO CAPS: 200.0000 mg | ORAL_CAPSULE | Freq: Two times a day (BID) | ORAL | 0 refills | Status: DC | PRN

## 2018-12-25 NOTE — Patient Instructions (Signed)
-   Take tessalon perls twice daily and tussionex at night time - Take plain musinex-DM or guaifenesin as directed on box  - Take claritin or another OTC antihistamine once daily for the next 2 weeks, use flonase nasal spray one spray in each nostril daily until congestion has gone - Take azithromycin- 2 tablets today with food and then 1 tablet each day for the next 4 days with food - If symptoms are not improving please let us know and we can refer you to a pulmonologist.

## 2018-12-25 NOTE — Progress Notes (Signed)
Name: Lori Oliver   MRN: [id number]    DOB: 1971/05/30   Date:12/25/2018       Progress Note  Subjective  Chief Complaint  Chief Complaint  Patient presents with  . Follow-up    ER f/u  . Nasal Congestion  . Cough    productive - thick greenish sputum  . Fatigue    HPI  Patient was seen in ER on 12/19/2017 due to cough, body aches and blood tinged sputum x 1 week. Chest xray was clear rx tussionex and directed to follow up with PCP if unimproved. States still coughing up thick green and blood tinged sputum. States tussionex makes her drowsy she can only take this at night- taking OTC cough syrup at work and recently [employer] as well. States she coughed so much at work that it caused her to vomit.   Patient Active Problem List   Diagnosis Date Noted  . Intermittent atrial fibrillation ([place]) 02/19/2018  . Hypokalemia 02/10/2018  . Anxiety about health 01/13/2018  . Atrial fibrillation with RVR ([place]) 12/01/2017  . Vitamin D deficiency 07/08/2017  . Lower back pain 07/07/2017  . Screen for colon cancer 07/07/2017  . Morbid obesity ([place]) 07/07/2017  . Essential hypertension 07/11/2016  . Preventative health care 05/16/2016  . IFG (impaired fasting glucose) 05/16/2016  . 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 Abnormal thyroid function test 05/16/2016  . Decreased libido 05/16/2016  . Breast cancer screening 05/16/2016  . Breast lump on left side at 5 o'clock position 05/16/2016  . Contraception management 05/16/2016  . Surveillance for birth control, oral contraceptives 02/12/2016  . Bilateral thoracic back pain 06/01/2015  . Blood pressure elevated without history of HTN 06/01/2015  . History of breast lump 06/01/2015  . History of abnormal cervical Pap smear 06/01/2015    Past Medical History:  Diagnosis Date  . Abnormal thyroid function test 05/16/2016  . Anxiety   . Atrial fibrillation ([place])   . 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 Decreased libido  05/16/2016  . Dysrhythmia   . Hypertension   . Hypokalemia   . Impaired fasting glucose   . Morbid obesity ([place]) 06/01/2015  . Morbid obesity ([place])   . Plantar fasciitis     Past Surgical History:  Procedure Laterality Date  . BREAST BIOPSY Right 2015   benign  . ESOPHAGOGASTRODUODENOSCOPY (EGD) WITH PROPOFOL N/A 08/12/2018   Procedure: ESOPHAGOGASTRODUODENOSCOPY (EGD) WITH PROPOFOL;  Surgeon: Toledo, Benay Pike, MD;  Location: [facility] ENDOSCOPY;  Service: Gastroenterology;  Laterality: N/A;  . HAMMER TOE SURGERY      Social History   Tobacco Use  . Smoking status: Never Smoker  . Smokeless tobacco: Never Used  Substance Use Topics  . Alcohol use: Yes    Alcohol/week: 0.0 standard drinks    Frequency: Never    Comment: occ     Current Outpatient Medications:  .  chlorpheniramine-HYDROcodone (TUSSIONEX PENNKINETIC ER) 10-8 MG/5ML SUER, Take 5 mLs by mouth 2 (two) times daily., Disp: 115 mL, Rfl: 0 .  hydrochlorothiazide (MICROZIDE) 12.5 MG capsule, Take 1 capsule (12.5 mg total) by mouth daily., Disp: 30 capsule, Rfl: 2 .  fluticasone (FLONASE) 50 MCG/ACT nasal spray, Place 2 sprays into both nostrils daily., Disp: 16 g, Rfl: 0  No Known Allergies  ROS   No other specific complaints in a complete review of systems (except as listed in HPI above).  Objective  Vitals:   12/25/18 1127  BP: 136/82  Pulse: 90  Resp: 18  Temp: 98.2 F (36.8 C)  TempSrc: Oral  SpO2: 99%  Weight: 274 lb 6.4 oz (124.5 kg)  Height: 5\' 6"  (1.676 m)    Body mass index is 44.29 kg/m.  Nursing Note and Vital Signs reviewed.  Physical Exam Constitutional:      Appearance: Normal appearance.  HENT:     Right Ear: Tympanic membrane, ear canal and external ear normal.     Left Ear: There is impacted cerumen.     Nose: Congestion present.     Mouth/Throat:     Mouth: Mucous membranes are dry.     Pharynx: No oropharyngeal exudate or posterior oropharyngeal erythema.  Eyes:      Conjunctiva/sclera: Conjunctivae normal.     Pupils: Pupils are equal, round, and reactive to light.  Cardiovascular:     Rate and Rhythm: Normal rate and regular rhythm.  Pulmonary:     Effort: Pulmonary effort is normal. No respiratory distress.     Breath sounds: Normal breath sounds. No stridor. No wheezing or rhonchi.  Lymphadenopathy:     Cervical: No cervical adenopathy.  Skin:    General: Skin is warm and dry.     Findings: No rash.  Neurological:     General: No focal deficit present.     Mental Status: She is alert and oriented to person, place, and time.  Psychiatric:        Mood and Affect: Mood normal.        Thought Content: Thought content normal.      No results found for this or any previous visit (from the past 48 hour(s)).  Assessment & Plan  1. Cough Wet cough, productive, worse at night has been taking tessalon perls, tussionex at night. Chest xray clear from ER- lung sound clear, however due to acute worsening of symptoms will add on Z-pack.   2. Nasal congestion - fluticasone (FLONASE) 50 MCG/ACT nasal spray; Place 2 sprays into both nostrils daily.  Dispense: 16 g; Refill: 0  3. Impacted cerumen of left ear - Ear Lavage    -Red flags and when to present for emergency care or RTC including fever >101.3F, chest pain, shortness of breath, new/worsening/un-resolving symptoms, reviewed with patient at time of visit. Follow up and care instructions discussed and provided in AVS.

## 2018-12-30 ENCOUNTER — Other Ambulatory Visit: Payer: Self-pay | Admitting: Nurse Practitioner

## 2018-12-30 DIAGNOSIS — I10 Essential (primary) hypertension: Secondary | ICD-10-CM

## 2019-01-01 ENCOUNTER — Ambulatory Visit: Payer: Self-pay

## 2019-01-01 NOTE — Telephone Encounter (Signed)
Pt returned call to office stating that she is concerned about her BP rising.  She has has several readings that the diastolic has bee in the 23'T.  She feels her medication may need adjusted. Pt BP last night was 136/95.  Today at work BP 147/89.  She denies symptoms. Appointment scheduled per protocol. Care advice read to patient. Pt verbalized understanding. Pt was advised to keep a BP diary and bring it to her appointment. Pt agrees.  Reason for Disposition . [5] Systolic BP  >= 320 OR Diastolic >= 80 AND [2] pregnant  Answer Assessment - Initial Assessment Questions 1. BLOOD PRESSURE: "What is the blood pressure?" "Did you take at least two measurements 5 minutes apart?"     136/95 last night  147/89 2. ONSET: "When did you take your blood pressure?"     Last several days noticing the bottom number is high 3. HOW: "How did you obtain the blood pressure?" (e.g., visiting nurse, automatic home BP monitor)     At work 4. HISTORY: "Do you have a history of high blood pressure?"     yes 5. MEDICATIONS: "Are you taking any medications for blood pressure?" "Have you missed any doses recently?"     No HCTZ  6. OTHER SYMPTOMS: "Do you have any symptoms?" (e.g., headache, chest pain, blurred vision, difficulty breathing, weakness)     no 7. PREGNANCY: "Is there any chance you are pregnant?" "When was your last menstrual period?"    menopaus spotting yesterday  Protocols used: HIGH BLOOD PRESSURE-A-AH

## 2019-01-04 ENCOUNTER — Ambulatory Visit (INDEPENDENT_AMBULATORY_CARE_PROVIDER_SITE_OTHER): Payer: Managed Care, Other (non HMO) | Admitting: Nurse Practitioner

## 2019-01-04 ENCOUNTER — Encounter: Payer: Self-pay | Admitting: Nurse Practitioner

## 2019-01-04 VITALS — BP 126/88 | HR 85 | Temp 98.4°F | Resp 16 | Ht 66.0 in | Wt 268.5 lb

## 2019-01-04 DIAGNOSIS — I10 Essential (primary) hypertension: Secondary | ICD-10-CM | POA: Diagnosis not present

## 2019-01-04 MED ORDER — HYDROCHLOROTHIAZIDE 12.5 MG PO CAPS: 12.5000 mg | ORAL_CAPSULE | Freq: Every day | ORAL | 0 refills | Status: DC

## 2019-01-04 NOTE — Progress Notes (Signed)
Name: Lori Oliver   MRN: [id number]    DOB: 04/20/1971   Date:01/04/2019       Progress Note  Subjective  Chief Complaint  Chief Complaint  Patient presents with  . Hypertension    patient uses the electronic wrist machine and have noticed that the bottom number has been elevated. patient also had one of the nurse to check it and it ranges about the same.  . Stress    patient's husband may have to go back on chemo    HPI  Patient has noted increased blood pressure readings over the past few weeks. Has increased stress with husbands health. States is most concerned about her diastolic has been in the 19E. Not a lot of salt in diet, eats a lot of salads, drinks a lot of water. Drinks a mountain dew in the morning. Has not been consistent with going to the gym recently due to working 2 jobs.  Denies headaches, chest pain, blurry vision. Wants to work hard on getting off medication. She notices she gets increasingly anxious when she has been checking her blood pressures.   BP Readings from Last 3 Encounters:  01/04/19 126/88  12/25/18 136/82  12/19/18 (!) 143/87   Wt Readings from Last 3 Encounters:  01/04/19 268 lb 8 oz (121.8 kg)  12/25/18 274 lb 6.4 oz (124.5 kg)  12/19/18 260 lb (117.9 kg)    Patient Active Problem List   Diagnosis Date Noted  . Intermittent atrial fibrillation ([place]) 02/19/2018  . Hypokalemia 02/10/2018  . Anxiety about health 01/13/2018  . Atrial fibrillation with RVR ([place]) 12/01/2017  . Vitamin D deficiency 07/08/2017  . Lower back pain 07/07/2017  . Screen for colon cancer 07/07/2017  . Morbid obesity ([place]) 07/07/2017  . Essential hypertension 07/11/2016  . Preventative health care 05/16/2016  . IFG (impaired fasting glucose) 05/16/2016  . 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 Abnormal thyroid function test 05/16/2016  . Decreased libido 05/16/2016  . Breast cancer screening 05/16/2016  . Breast lump on left side at 5 o'clock position  05/16/2016  . Contraception management 05/16/2016  . Surveillance for birth control, oral contraceptives 02/12/2016  . Bilateral thoracic back pain 06/01/2015  . Blood pressure elevated without history of HTN 06/01/2015  . History of breast lump 06/01/2015  . History of abnormal cervical Pap smear 06/01/2015    Past Medical History:  Diagnosis Date  . Abnormal thyroid function test 05/16/2016  . Anxiety   . Atrial fibrillation ([place])   . 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 Decreased libido 05/16/2016  . Dysrhythmia   . Hypertension   . Hypokalemia   . Impaired fasting glucose   . Morbid obesity ([place]) 06/01/2015  . Morbid obesity ([place])   . Plantar fasciitis     Past Surgical History:  Procedure Laterality Date  . BREAST BIOPSY Right 2015   benign  . ESOPHAGOGASTRODUODENOSCOPY (EGD) WITH PROPOFOL N/A 08/12/2018   Procedure: ESOPHAGOGASTRODUODENOSCOPY (EGD) WITH PROPOFOL;  Surgeon: Toledo, Benay Pike, MD;  Location: [facility] ENDOSCOPY;  Service: Gastroenterology;  Laterality: N/A;  . HAMMER TOE SURGERY      Social History   Tobacco Use  . Smoking status: Never Smoker  . Smokeless tobacco: Never Used  Substance Use Topics  . Alcohol use: Yes    Alcohol/week: 0.0 standard drinks    Frequency: Never    Comment: occ     Current Outpatient Medications:  .  hydrochlorothiazide (MICROZIDE) 12.5 MG capsule, TAKE 1 CAPSULE  BY MOUTH ONCE DAILY, Disp: 90 capsule, Rfl: 0  No Known Allergies  ROS   No other specific complaints in a complete review of systems (except as listed in HPI above).  Objective  Vitals:   01/04/19 1522  Pulse: 85  Resp: 16  Temp: 98.4 F (36.9 C)  TempSrc: Oral  SpO2: 99%  Weight: 268 lb 8 oz (121.8 kg)  Height: 5\' 6"  (1.676 m)    Body mass index is 43.34 kg/m.  Nursing Note and Vital Signs reviewed.  Physical Exam Constitutional:      Appearance: Normal appearance.  HENT:     Head: Normocephalic and atraumatic.  Eyes:      Conjunctiva/sclera: Conjunctivae normal.  Cardiovascular:     Rate and Rhythm: Normal rate and regular rhythm.  Pulmonary:     Effort: Pulmonary effort is normal.     Breath sounds: Normal breath sounds.  Skin:    General: Skin is warm and dry.  Neurological:     General: No focal deficit present.     Mental Status: She is alert.  Psychiatric:        Mood and Affect: Mood normal.        Thought Content: Thought content normal.       No results found for this or any previous visit (from the past 48 hour(s)).  Assessment & Plan  1. Essential hypertension Continue current dose, discussed stress reduction, weight loss, DASH guidelines; if consistently elevated above 140/90 please return  - hydrochlorothiazide (MICROZIDE) 12.5 MG capsule; Take 1 capsule (12.5 mg total) by mouth daily.  Dispense: 90 capsule; Refill: 0

## 2019-01-04 NOTE — Patient Instructions (Addendum)
Your goal blood pressure is less than 140 mmHg on top; 90 mmHg on bottom.  Try to follow the DASH guidelines (DASH stands for Dietary Approaches to Stop Hypertension) Try to limit the sodium in your diet.  Ideally, consume less than 1.5 grams (less than 1,500mg ) per day. Do not add salt when cooking or at the table.  Check the sodium amount on labels when shopping, and choose items lower in sodium when given a choice. Avoid or limit foods that already contain a lot of sodium. Eat a diet rich in fruits and vegetables and whole grains.  - Cutting down on sodas- goal is to cut out sodas.   - check your blood pressure 1-2 times a week maximum unless you are feeling sick you can check it then. If you are noticing consistently elevated blood pressures over 140/90 please let us know.

## 2019-02-07 IMAGING — MG MM DIGITAL SCREENING BILAT W/ TOMO W/ CAD
8 of 9 series · 8 of 17 positions shown · non-contrast
Comparison: Previous exam(s).

CLINICAL DATA: Screening.

EXAM:
DIGITAL SCREENING BILATERAL MAMMOGRAM WITH TOMO AND CAD

[L CC synth-2D]
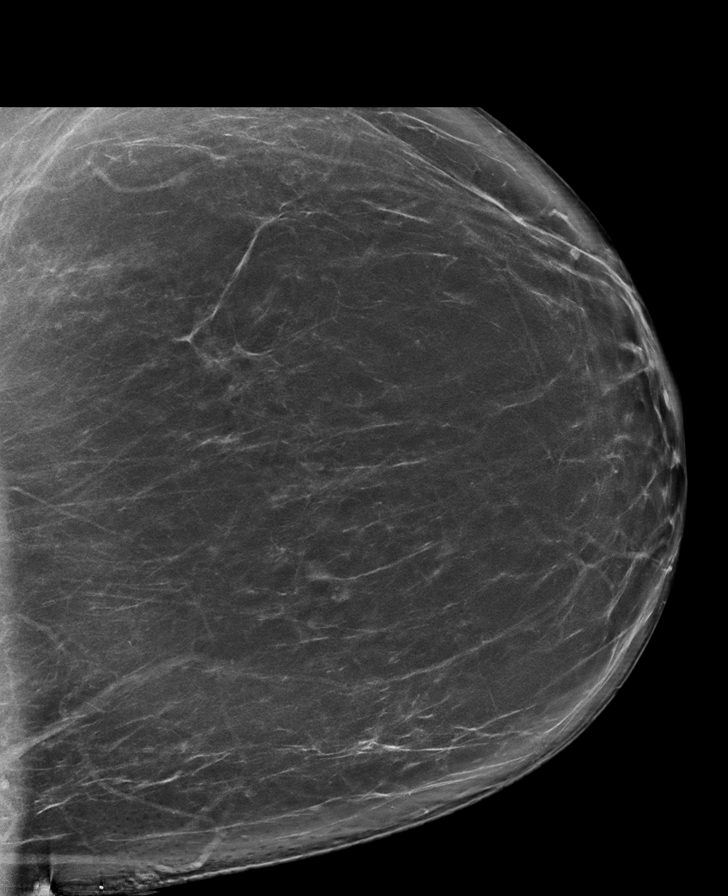

[L MLO synth-2D (1 of 2)]
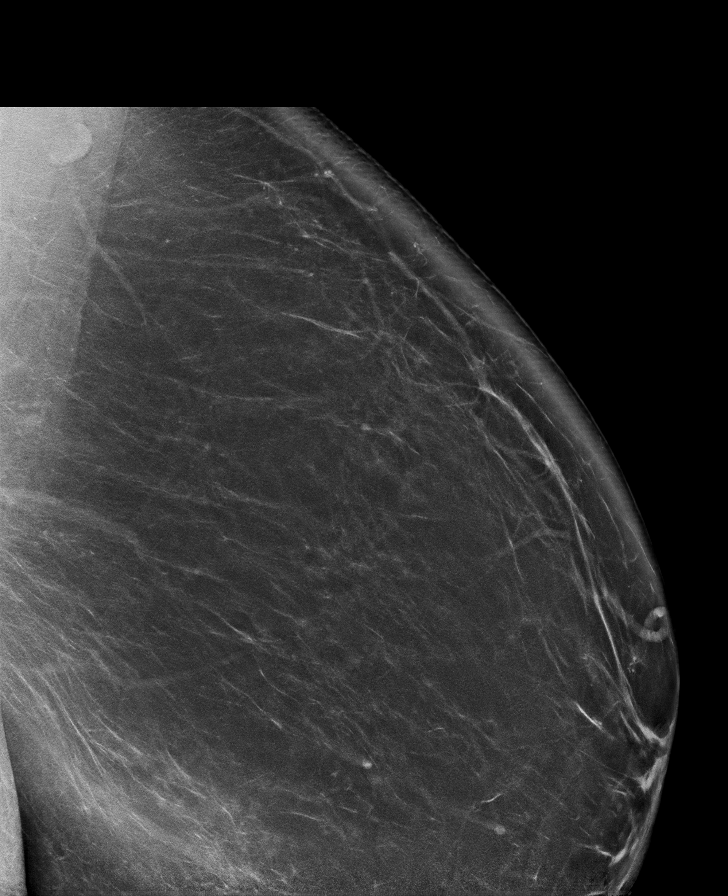

[R MLO synth-2D (1 of 2)]
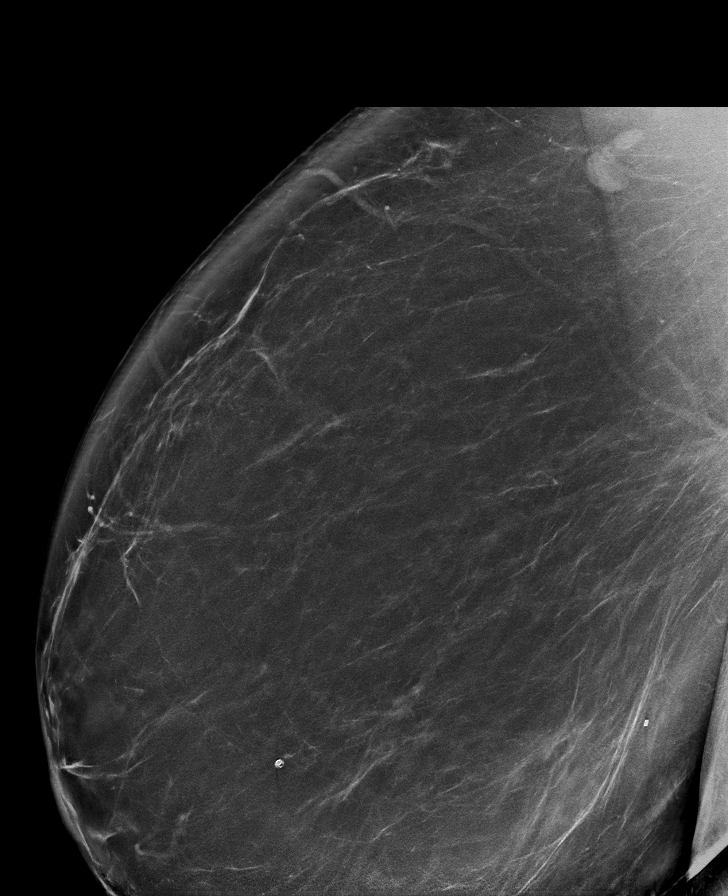

[L XCCM synth-2D]
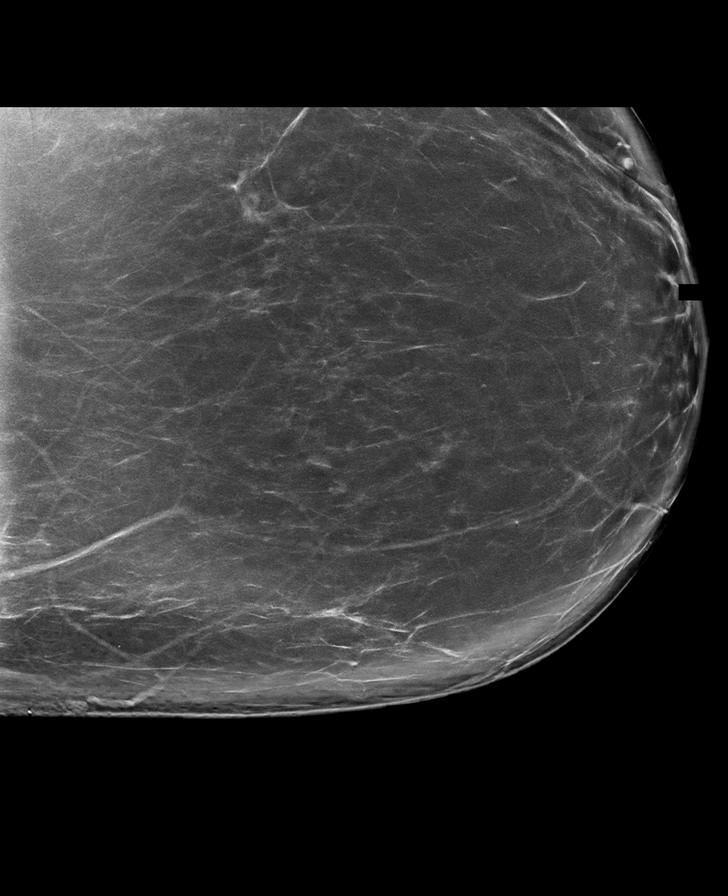

[L MLO synth-2D (2 of 2)]
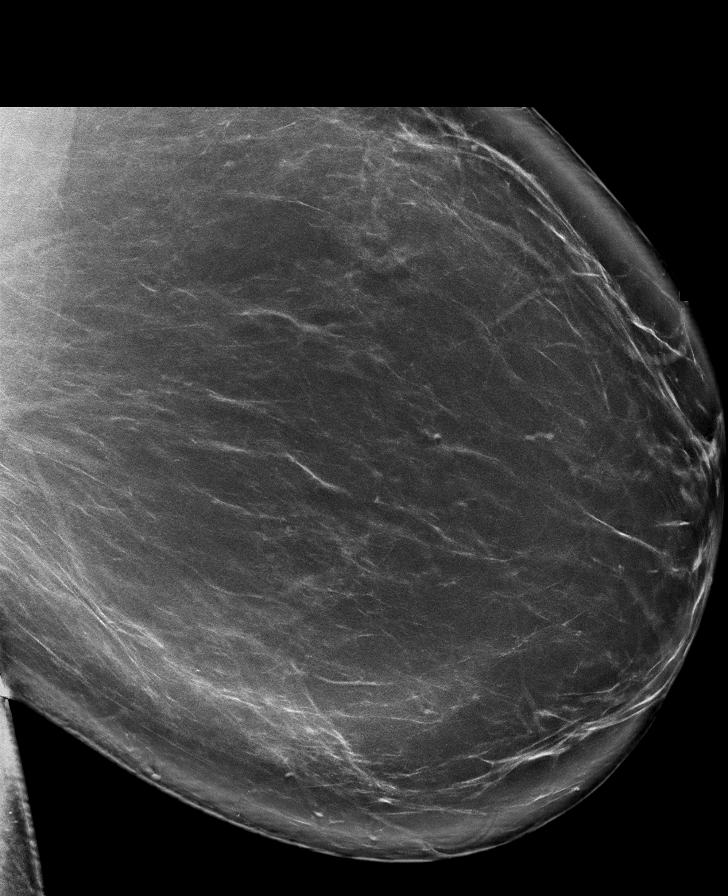

[R CC synth-2D]
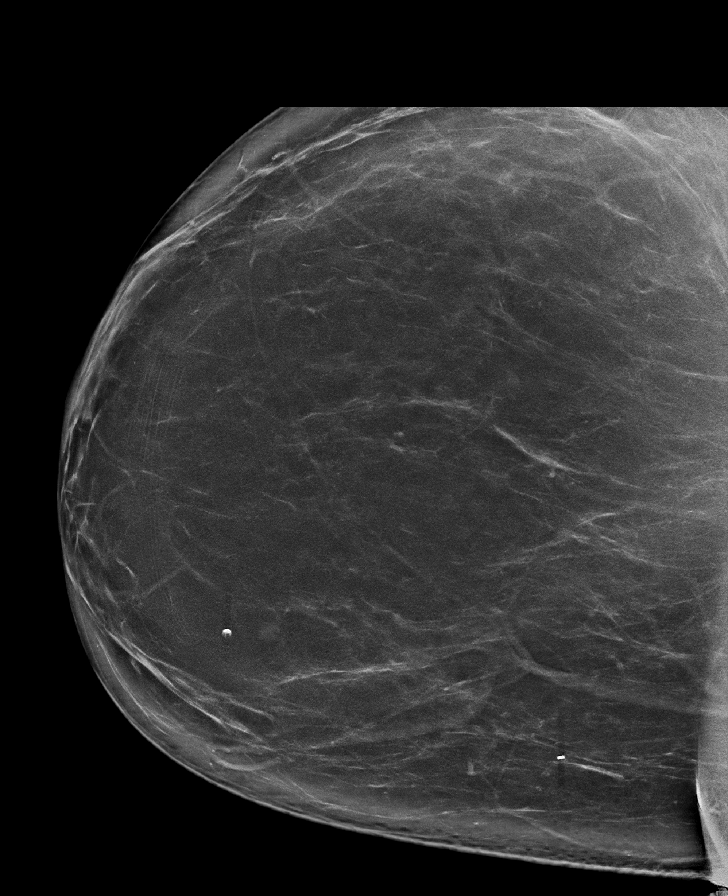

[R MLO synth-2D (2 of 2)]
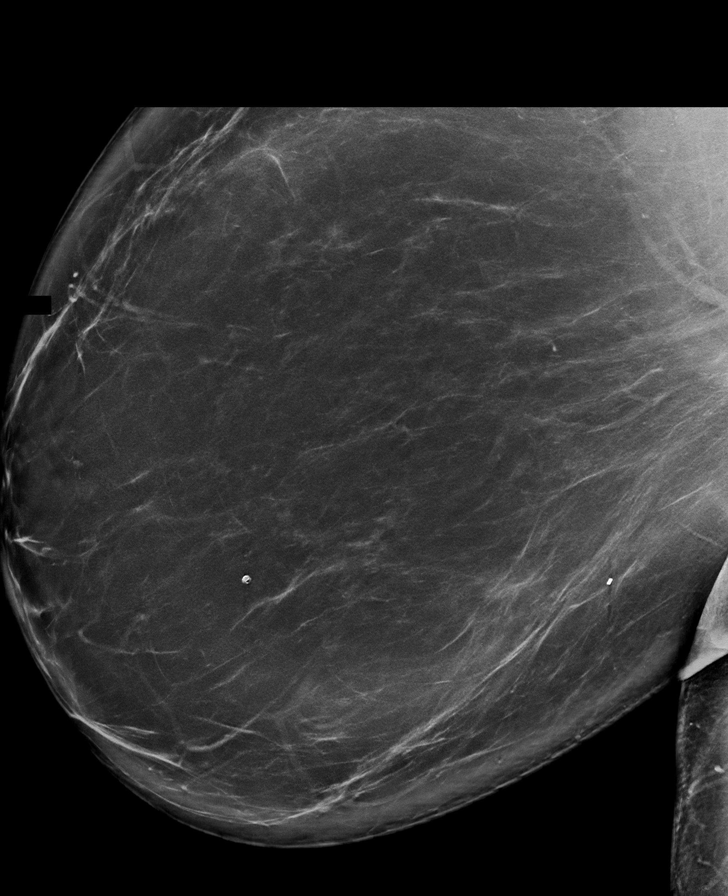

[L XCCM BREAST TOMOSYNTHESIS IMAGE tomo · tomo slice 59/118.0]
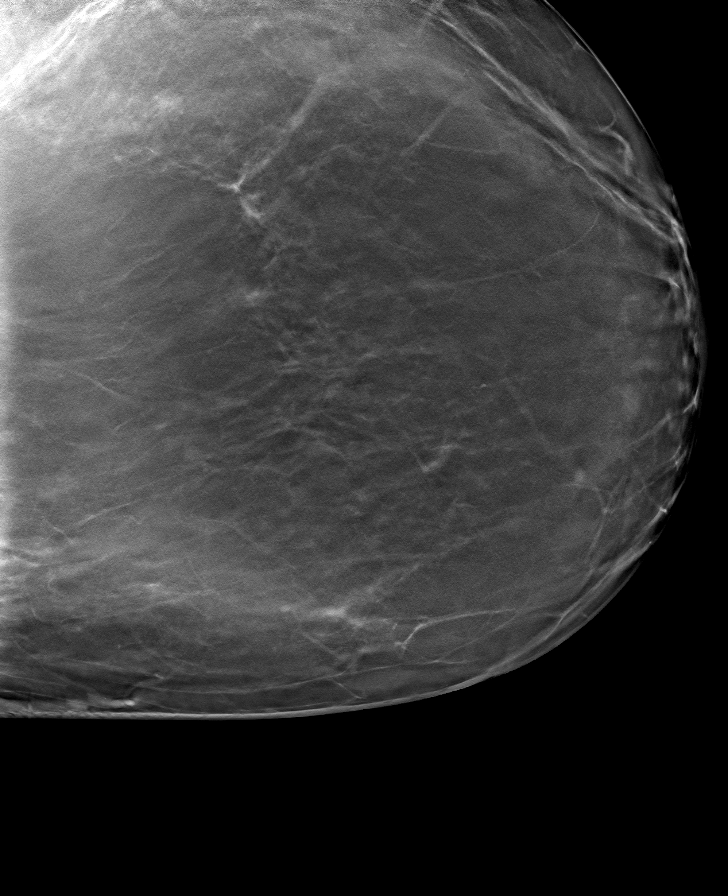

[8 of 17 positions shown; findings below may reference images not displayed]

ACR Breast Density Category b: There are scattered areas of
fibroglandular density.
FINDINGS: There are no findings suspicious for malignancy. Images were
processed with CAD.
IMPRESSION: No mammographic evidence of malignancy. A result letter of this
screening mammogram will be mailed directly to the patient.

RECOMMENDATION:
Screening mammogram in one year. (Code:[id number])

BI-RADS CATEGORY  1: Negative.

## 2019-02-22 ENCOUNTER — Ambulatory Visit (INDEPENDENT_AMBULATORY_CARE_PROVIDER_SITE_OTHER): Payer: Managed Care, Other (non HMO) | Admitting: Nurse Practitioner

## 2019-02-22 ENCOUNTER — Encounter: Payer: Self-pay | Admitting: Nurse Practitioner

## 2019-02-22 VITALS — BP 116/78 | HR 95 | Temp 97.8°F | Resp 18 | Ht 66.0 in | Wt 274.3 lb

## 2019-02-22 DIAGNOSIS — L299 Pruritus, unspecified: Secondary | ICD-10-CM | POA: Diagnosis not present

## 2019-02-22 DIAGNOSIS — L308 Other specified dermatitis: Secondary | ICD-10-CM

## 2019-02-22 MED ORDER — CRISABOROLE 2 % EX OINT: 1.0000 "application " | TOPICAL_OINTMENT | Freq: Every day | CUTANEOUS | 3 refills | Status: DC

## 2019-02-22 NOTE — Patient Instructions (Signed)
-   Take 25mg  of benadryl at night, if this makes you too drowsy please take alternate such as Claritin, or zyrtec. - Use eucrisa cream to dry itchy areas - Referral to Derm has been made- please cancel if you do not need this.   Pruritus Pruritus is an itchy feeling on the skin. One of the most common causes is dry skin, but many different things can cause itching. Most cases of itching do not require medical attention. Sometimes itchy skin can turn into a rash. Follow these instructions at home: Skin care  Apply moisturizing lotion to your skin as needed. Lotion that contains petroleum jelly is best.  Take medicines or apply medicated creams only as told by your health care provider. This may include: ? Corticosteroid cream. ? Anti-itch lotions. ? Oral antihistamines.  Apply a cool, wet cloth (cool compress) to the affected areas.  Take baths with one of the following: ? Epsom salts. You can get these at your local pharmacy or grocery store. Follow the instructions on the packaging. ? Baking soda. Pour a small amount into the bath as told by your health care provider. ? Colloidal oatmeal. You can get this at your local pharmacy or grocery store. Follow the instructions on the packaging.  Apply baking soda paste to your skin. To make the paste, stir water into a small amount of baking soda until it reaches a paste-like consistency.  Do not scratch your skin.  Do not take hot showers or baths, which can make itching worse. A cool shower may help with itching as long as you apply moisturizing lotion after the shower.  Do not use scented soaps, detergents, perfumes, and cosmetic products. Instead, use gentle, unscented versions of these items. General instructions  Avoid wearing tight clothes.  Keep a journal to help find out what is causing your itching. Write down: ? What you eat and drink. ? What cosmetic products you use. ? What soaps or detergents you use. ? What you wear,  including jewelry.  Use a humidifier. This keeps the air moist, which helps to prevent dry skin.  Be aware of any changes in your itchiness. Contact a health care provider if:  The itching does not go away after several days.  You are unusually thirsty or urinating more than normal.  Your skin tingles or feels numb.  Your skin or the white parts of your eyes turn yellow (jaundice).  You feel weak.  You have any of the following: ? Night sweats. ? Tiredness (fatigue). ? Weight loss. ? Abdominal pain. Summary  Pruritus is an itchy feeling on the skin. One of the most common causes is dry skin, but many different conditions and factors can cause itching.  Apply moisturizing lotion to your skin as needed. Lotion that contains petroleum jelly is best.  Take medicines or apply medicated creams only as told by your health care provider.  Do not take hot showers or baths. Do not use scented soaps, detergents, perfumes, or cosmetic products. This information is not intended to replace advice given to you by your health care provider. Make sure you discuss any questions you have with your health care provider. Document Released: 08/14/2011 Document Revised: 12/16/2017 Document Reviewed: 12/16/2017 Elsevier Interactive Patient Education  2019 [employer].

## 2019-02-22 NOTE — Progress Notes (Signed)
Name: Lori Oliver   MRN: [id number]    DOB: 11-Nov-1971   Date:02/22/2019       Progress Note  Subjective  Chief Complaint  Chief Complaint  Patient presents with  . Rash    back, arms, legs. Onset Saturday.     HPI  Noticed itching started on Saturday and has progressively getting worse. Initially itching in back and then upper arm and now throughout arms and legs. Tried hydrocortisone cream which worsened it. States used cocoa butter with some relief.  No new laundry detergents, soaps, shampoos, lotions. Has not slept anywhere new. 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 Her husband does not have any of these symptoms.  No nausea, vomiting, fevers or chills, no pain.   Patient Active Problem List   Diagnosis Date Noted  . Intermittent atrial fibrillation ([place]) 02/19/2018  . Hypokalemia 02/10/2018  . Anxiety about health 01/13/2018  . Atrial fibrillation with RVR ([place]) 12/01/2017  . Vitamin D deficiency 07/08/2017  . Lower back pain 07/07/2017  . Screen for colon cancer 07/07/2017  . Morbid obesity ([employer]) 07/07/2017  . Essential hypertension 07/11/2016  . Preventative health care 05/16/2016  . IFG (impaired fasting glucose) 05/16/2016  . 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 Abnormal thyroid function test 05/16/2016  . Decreased libido 05/16/2016  . Breast cancer screening 05/16/2016  . Breast lump on left side at 5 o'clock position 05/16/2016  . Contraception management 05/16/2016  . Surveillance for birth control, oral contraceptives 02/12/2016  . Bilateral thoracic back pain 06/01/2015  . Blood pressure elevated without history of HTN 06/01/2015  . History of breast lump 06/01/2015  . History of abnormal cervical Pap smear 06/01/2015    Past Medical History:  Diagnosis Date  . Abnormal thyroid function test 05/16/2016  . Anxiety   . Atrial fibrillation ([place])   . 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 Decreased libido 05/16/2016   . Dysrhythmia   . Hypertension   . Hypokalemia   . Impaired fasting glucose   . Morbid obesity ([place]) 06/01/2015  . Morbid obesity ([place])   . Plantar fasciitis     Past Surgical History:  Procedure Laterality Date  . BREAST BIOPSY Right 2015   benign  . ESOPHAGOGASTRODUODENOSCOPY (EGD) WITH PROPOFOL N/A 08/12/2018   Procedure: ESOPHAGOGASTRODUODENOSCOPY (EGD) WITH PROPOFOL;  Surgeon: Toledo, Benay Pike, MD;  Location: [facility] ENDOSCOPY;  Service: Gastroenterology;  Laterality: N/A;  . HAMMER TOE SURGERY      Social History   Tobacco Use  . Smoking status: Never Smoker  . Smokeless tobacco: Never Used  Substance Use Topics  . Alcohol use: Yes    Alcohol/week: 0.0 standard drinks    Frequency: Never    Comment: occ     Current Outpatient Medications:  .  hydrochlorothiazide (MICROZIDE) 12.5 MG capsule, Take 1 capsule (12.5 mg total) by mouth daily., Disp: 90 capsule, Rfl: 0 .  Crisaborole (EUCRISA) 2 % OINT, Apply 1 application topically daily., Disp: 1 Tube, Rfl: 3  No Known Allergies  ROS  No other specific complaints in a complete review of systems (except as listed in HPI above).  Objective  Vitals:   02/22/19 1456  BP: 116/78  Pulse: 95  Resp: 18  Temp: 97.8 F (36.6 C)  SpO2: 97%  Weight: 274 lb 4.8 oz (124.4 kg)  Height: 5\' 6"  (1.676 m)     Body mass index is 44.27 kg/m.  Nursing Note and Vital  Signs reviewed.  Physical Exam Constitutional:      Appearance: Normal appearance.  Cardiovascular:     Rate and Rhythm: Normal rate and regular rhythm.  Pulmonary:     Effort: Pulmonary effort is normal.     Breath sounds: Normal breath sounds.  Skin:    General: Skin is warm and dry.     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 excortication marks noted on back and upper arms. Small raised scabed areas noted. No red raised lesions or vesicles notes. No itching in webs of hands.   Neurological:      General: No focal deficit present.     Mental Status: She is alert and oriented to person, place, and time.  Psychiatric:        Mood and Affect: Mood normal.        Behavior: Behavior normal.      No results found for this or any previous visit (from the past 48 hour(s)).  Assessment & Plan 1. Pruritic dermatitis Take nightly benadryl or daily Claritin. Does not appear like hives; No signs of scabies, checked bed for bed bugs, not redness or drainage or heat noted. Possible eczema component.  - Crisaborole (EUCRISA) 2 % OINT; Apply 1 application topically daily.  Dispense: 1 Tube; Refill: 3 - Ambulatory referral to Dermatology

## 2019-03-13 ENCOUNTER — Encounter: Payer: Self-pay | Admitting: Emergency Medicine

## 2019-03-13 ENCOUNTER — Emergency Department: Payer: Managed Care, Other (non HMO)

## 2019-03-13 ENCOUNTER — Other Ambulatory Visit: Payer: Self-pay

## 2019-03-13 ENCOUNTER — Inpatient Hospital Stay
Admission: EM | Admit: 2019-03-13 | Discharge: 2019-03-14 | DRG: 310 | Disposition: A | Discharge status: Home / Self Care | Payer: Managed Care, Other (non HMO) | Attending: Internal Medicine | Admitting: Internal Medicine

## 2019-03-13 DIAGNOSIS — I480 Paroxysmal atrial fibrillation: Secondary | ICD-10-CM | POA: Diagnosis not present

## 2019-03-13 DIAGNOSIS — I4891 Unspecified atrial fibrillation: Secondary | ICD-10-CM | POA: Diagnosis not present

## 2019-03-13 DIAGNOSIS — Z79899 Other long term (current) drug therapy: Secondary | ICD-10-CM

## 2019-03-13 DIAGNOSIS — F419 Anxiety disorder, unspecified: Secondary | ICD-10-CM | POA: Diagnosis present

## 2019-03-13 DIAGNOSIS — I10 Essential (primary) hypertension: Secondary | ICD-10-CM | POA: Diagnosis present

## 2019-03-13 DIAGNOSIS — G4730 Sleep apnea, unspecified: Secondary | ICD-10-CM | POA: Diagnosis present

## 2019-03-13 DIAGNOSIS — K219 Gastro-esophageal reflux disease without esophagitis: Secondary | ICD-10-CM | POA: Diagnosis present

## 2019-03-13 DIAGNOSIS — Z8249 Family history of ischemic heart disease and other diseases of the circulatory system: Secondary | ICD-10-CM

## 2019-03-13 DIAGNOSIS — I509 Heart failure, unspecified: Secondary | ICD-10-CM | POA: Diagnosis present

## 2019-03-13 DIAGNOSIS — Z794 Long term (current) use of insulin: Secondary | ICD-10-CM

## 2019-03-13 DIAGNOSIS — I110 Hypertensive heart disease with heart failure: Secondary | ICD-10-CM | POA: Diagnosis present

## 2019-03-13 DIAGNOSIS — R079 Chest pain, unspecified: Secondary | ICD-10-CM

## 2019-03-13 DIAGNOSIS — E876 Hypokalemia: Secondary | ICD-10-CM | POA: Diagnosis present

## 2019-03-13 LAB — CBC WITH DIFFERENTIAL/PLATELET
Abs Immature Granulocytes: 0.03 10*3/uL (ref 0.00–0.07)
Basophils Absolute: 0 10*3/uL (ref 0.0–0.1)
Basophils Relative: 0 %
EOS PCT: 4 %
Eosinophils Absolute: 0.4 10*3/uL (ref 0.0–0.5)
HCT: 39.9 % (ref 36.0–46.0)
Hemoglobin: 13.1 g/dL (ref 12.0–15.0)
Immature Granulocytes: 0 %
Lymphocytes Relative: 49 %
Lymphs Abs: 4.8 10*3/uL — ABNORMAL HIGH (ref 0.7–4.0)
MCH: 31.2 pg (ref 26.0–34.0)
MCHC: 32.8 g/dL (ref 30.0–36.0)
MCV: 95 fL (ref 80.0–100.0)
Monocytes Absolute: 0.6 10*3/uL (ref 0.1–1.0)
Monocytes Relative: 6 %
Neutro Abs: 4 10*3/uL (ref 1.7–7.7)
Neutrophils Relative %: 41 %
Platelets: 286 10*3/uL (ref 150–400)
RBC: 4.2 MIL/uL (ref 3.87–5.11)
RDW: 12.8 % (ref 11.5–15.5)
WBC: 9.9 10*3/uL (ref 4.0–10.5)
nRBC: 0 % (ref 0.0–0.2)

## 2019-03-13 MED ORDER — SODIUM CHLORIDE 0.9% FLUSH
3.0000 mL | Freq: Once | INTRAVENOUS | Status: AC
  Administered 2019-03-13: 3 mL via INTRAVENOUS

## 2019-03-13 MED ORDER — DILTIAZEM HCL 100 MG IV SOLR
5.0000 mg/h | INTRAVENOUS | Status: DC
  Administered 2019-03-14: 5 mg/h via INTRAVENOUS
  Administered 2019-03-14: 15 mg/h via INTRAVENOUS
  Filled 2019-03-13 (×2): qty 100

## 2019-03-13 MED ORDER — DILTIAZEM HCL 25 MG/5ML IV SOLN
10.0000 mg | Freq: Once | INTRAVENOUS | Status: AC
  Administered 2019-03-13: 10 mg via INTRAVENOUS
  Filled 2019-03-13: qty 5

## 2019-03-13 NOTE — ED Notes (Signed)
Verb order from [place] to give 15mg  of cardizem IV push in another 10 mins then hang bag.

## 2019-03-13 NOTE — ED Triage Notes (Signed)
Pt arrives POV to triage with c/o chest pain. Pt says she was watching tv when pain started; pt says her HR at home was at one point 184; says it feels like it's come down some now; pt points to center of chest when showing where pain is located; constant sharp pain; non radiating; pt talking in complete coherent sentences;

## 2019-03-14 ENCOUNTER — Inpatient Hospital Stay (HOSPITAL_COMMUNITY)
Admit: 2019-03-14 | Discharge: 2019-03-14 | Disposition: A | Discharge status: Home / Self Care | Payer: Managed Care, Other (non HMO) | Attending: Adult Health | Admitting: Adult Health

## 2019-03-14 DIAGNOSIS — I509 Heart failure, unspecified: Secondary | ICD-10-CM | POA: Diagnosis present

## 2019-03-14 DIAGNOSIS — R079 Chest pain, unspecified: Secondary | ICD-10-CM

## 2019-03-14 DIAGNOSIS — I4891 Unspecified atrial fibrillation: Secondary | ICD-10-CM

## 2019-03-14 DIAGNOSIS — F419 Anxiety disorder, unspecified: Secondary | ICD-10-CM | POA: Diagnosis present

## 2019-03-14 DIAGNOSIS — E876 Hypokalemia: Secondary | ICD-10-CM | POA: Diagnosis present

## 2019-03-14 DIAGNOSIS — Z79899 Other long term (current) drug therapy: Secondary | ICD-10-CM | POA: Diagnosis not present

## 2019-03-14 DIAGNOSIS — G4733 Obstructive sleep apnea (adult) (pediatric): Secondary | ICD-10-CM | POA: Diagnosis not present

## 2019-03-14 DIAGNOSIS — G4730 Sleep apnea, unspecified: Secondary | ICD-10-CM | POA: Diagnosis present

## 2019-03-14 DIAGNOSIS — K219 Gastro-esophageal reflux disease without esophagitis: Secondary | ICD-10-CM | POA: Diagnosis present

## 2019-03-14 DIAGNOSIS — I480 Paroxysmal atrial fibrillation: Secondary | ICD-10-CM | POA: Diagnosis present

## 2019-03-14 DIAGNOSIS — I10 Essential (primary) hypertension: Secondary | ICD-10-CM

## 2019-03-14 DIAGNOSIS — Z8249 Family history of ischemic heart disease and other diseases of the circulatory system: Secondary | ICD-10-CM | POA: Diagnosis not present

## 2019-03-14 DIAGNOSIS — Z794 Long term (current) use of insulin: Secondary | ICD-10-CM | POA: Diagnosis not present

## 2019-03-14 DIAGNOSIS — I110 Hypertensive heart disease with heart failure: Secondary | ICD-10-CM | POA: Diagnosis present

## 2019-03-14 LAB — TSH
TSH: 3.709 u[IU]/mL (ref 0.350–4.500)
TSH: 4.364 u[IU]/mL (ref 0.350–4.500)

## 2019-03-14 LAB — COMPREHENSIVE METABOLIC PANEL
ALT: 25 U/L (ref 0–44)
ANION GAP: 10 (ref 5–15)
AST: 24 U/L (ref 15–41)
Albumin: 3.7 g/dL (ref 3.5–5.0)
Alkaline Phosphatase: 55 U/L (ref 38–126)
BUN: 13 mg/dL (ref 6–20)
CO2: 23 mmol/L (ref 22–32)
Calcium: 8.8 mg/dL — ABNORMAL LOW (ref 8.9–10.3)
Chloride: 106 mmol/L (ref 98–111)
Creatinine, Ser: 0.91 mg/dL (ref 0.44–1.00)
GFR calc Af Amer: >60 mL/min (ref >60)
GFR calc non Af Amer: >60 mL/min (ref >60)
Glucose, Bld: 133 mg/dL — ABNORMAL HIGH (ref 70–99)
POTASSIUM: 3.4 mmol/L — AB (ref 3.5–5.1)
Sodium: 139 mmol/L (ref 135–145)
Total Bilirubin: 0.4 mg/dL (ref 0.3–1.2)
Total Protein: 7.7 g/dL (ref 6.5–8.1)

## 2019-03-14 LAB — BASIC METABOLIC PANEL
Anion gap: 8 (ref 5–15)
BUN: 14 mg/dL (ref 6–20)
CO2: 23 mmol/L (ref 22–32)
Calcium: 8.5 mg/dL — ABNORMAL LOW (ref 8.9–10.3)
Chloride: 106 mmol/L (ref 98–111)
Creatinine, Ser: 0.82 mg/dL (ref 0.44–1.00)
GFR calc Af Amer: >60 mL/min (ref >60)
GFR calc non Af Amer: >60 mL/min (ref >60)
Glucose, Bld: 123 mg/dL — ABNORMAL HIGH (ref 70–99)
Potassium: 3.8 mmol/L (ref 3.5–5.1)
Sodium: 137 mmol/L (ref 135–145)

## 2019-03-14 LAB — CBC
HEMATOCRIT: 38.7 % (ref 36.0–46.0)
Hemoglobin: 12.8 g/dL (ref 12.0–15.0)
MCH: 31.5 pg (ref 26.0–34.0)
MCHC: 33.1 g/dL (ref 30.0–36.0)
MCV: 95.3 fL (ref 80.0–100.0)
Platelets: 292 10*3/uL (ref 150–400)
RBC: 4.06 MIL/uL (ref 3.87–5.11)
RDW: 12.9 % (ref 11.5–15.5)
WBC: 8.5 10*3/uL (ref 4.0–10.5)
nRBC: 0 % (ref 0.0–0.2)

## 2019-03-14 LAB — MAGNESIUM: Magnesium: 2 mg/dL (ref 1.7–2.4)

## 2019-03-14 LAB — GLUCOSE, CAPILLARY: Glucose-Capillary: 116 mg/dL — ABNORMAL HIGH (ref 70–99)

## 2019-03-14 LAB — TROPONIN I
Troponin I: 0.04 ng/mL (ref <0.03)
Troponin I: 0.04 ng/mL (ref <0.03)
Troponin I: <0.03 ng/mL (ref <0.03)

## 2019-03-14 LAB — ECHOCARDIOGRAM COMPLETE
Height: 66 in
Weight: 4324.54 oz

## 2019-03-14 LAB — PHOSPHORUS: Phosphorus: 3.3 mg/dL (ref 2.5–4.6)

## 2019-03-14 LAB — CKMB (ARMC ONLY)
CK, MB: 2.2 ng/mL (ref 0.5–5.0)
CK, MB: 2.3 ng/mL (ref 0.5–5.0)

## 2019-03-14 LAB — MRSA PCR SCREENING: MRSA by PCR: NEGATIVE

## 2019-03-14 MED ORDER — ONDANSETRON HCL 4 MG/2ML IJ SOLN: 4.0000 mg | Freq: Four times a day (QID) | INTRAMUSCULAR | Status: DC | PRN

## 2019-03-14 MED ORDER — POTASSIUM CHLORIDE 20 MEQ PO PACK
20.0000 meq | PACK | Freq: Once | ORAL | Status: DC
  Filled 2019-03-14: qty 1

## 2019-03-14 MED ORDER — DILTIAZEM HCL 25 MG/5ML IV SOLN
INTRAVENOUS | Status: AC
  Administered 2019-03-14: 10 mg via INTRAVENOUS
  Filled 2019-03-14: qty 5

## 2019-03-14 MED ORDER — ASPIRIN EC 325 MG PO TBEC
325.0000 mg | DELAYED_RELEASE_TABLET | Freq: Every day | ORAL | Status: DC
  Administered 2019-03-14: 325 mg via ORAL
  Filled 2019-03-14: qty 1

## 2019-03-14 MED ORDER — DILTIAZEM HCL 30 MG PO TABS: 30.0000 mg | ORAL_TABLET | Freq: Three times a day (TID) | ORAL | 0 refills | Status: DC | PRN

## 2019-03-14 MED ORDER — ACETAMINOPHEN 325 MG PO TABS: 650.0000 mg | ORAL_TABLET | Freq: Four times a day (QID) | ORAL | Status: DC | PRN

## 2019-03-14 MED ORDER — DILTIAZEM HCL 30 MG PO TABS
30.0000 mg | ORAL_TABLET | Freq: Two times a day (BID) | ORAL | Status: DC
  Administered 2019-03-14: 30 mg via ORAL
  Filled 2019-03-14: qty 1

## 2019-03-14 MED ORDER — PERFLUTREN LIPID MICROSPHERE
1.0000 mL | INTRAVENOUS | Status: AC | PRN
  Administered 2019-03-14: 2 mL via INTRAVENOUS
  Filled 2019-03-14: qty 10

## 2019-03-14 MED ORDER — TRAZODONE HCL 50 MG PO TABS: 25.0000 mg | ORAL_TABLET | Freq: Every evening | ORAL | Status: DC | PRN

## 2019-03-14 MED ORDER — ENOXAPARIN SODIUM 40 MG/0.4ML ~~LOC~~ SOLN
40.0000 mg | SUBCUTANEOUS | Status: DC
  Administered 2019-03-14: 40 mg via SUBCUTANEOUS
  Filled 2019-03-14: qty 0.4

## 2019-03-14 MED ORDER — DIGOXIN 0.25 MG/ML IJ SOLN
0.2500 mg | Freq: Once | INTRAMUSCULAR | Status: AC
  Administered 2019-03-14: 0.25 mg via INTRAVENOUS
  Filled 2019-03-14: qty 2

## 2019-03-14 MED ORDER — DILTIAZEM HCL 25 MG/5ML IV SOLN
10.0000 mg | Freq: Once | INTRAVENOUS | Status: AC
  Administered 2019-03-14: 10 mg via INTRAVENOUS

## 2019-03-14 MED ORDER — PANTOPRAZOLE SODIUM 40 MG PO TBEC
40.0000 mg | DELAYED_RELEASE_TABLET | Freq: Every day | ORAL | Status: DC
  Administered 2019-03-14: 40 mg via ORAL
  Filled 2019-03-14: qty 1

## 2019-03-14 MED ORDER — MAGNESIUM HYDROXIDE 400 MG/5ML PO SUSP: 30.0000 mL | Freq: Every day | ORAL | Status: DC | PRN

## 2019-03-14 MED ORDER — ONDANSETRON HCL 4 MG PO TABS: 4.0000 mg | ORAL_TABLET | Freq: Four times a day (QID) | ORAL | Status: DC | PRN

## 2019-03-14 MED ORDER — ACETAMINOPHEN 650 MG RE SUPP: 650.0000 mg | Freq: Four times a day (QID) | RECTAL | Status: DC | PRN

## 2019-03-14 NOTE — ED Notes (Signed)
Pt on phone again. Every time pt talks on phone HR inc to 140s; otherwise 105-120s. Urine sample sent to lab.

## 2019-03-14 NOTE — ED Notes (Signed)
Pt assisted up to commode to void.  

## 2019-03-14 NOTE — ED Notes (Signed)
ED TO INPATIENT HANDOFF REPORT  ED Nurse Name and Phone #: Metta Clines [phone]  S Name/Age/Gender Lori Oliver 48 y.o. female Room/Bed: ED10A/ED10A  Code Status   Code Status: Prior  Home/SNF/Other Home Patient oriented to: self, place, time and situation Is this baseline? Yes   Triage Complete: Triage complete  Chief Complaint chest pain  Triage Note Pt arrives POV to triage with c/o chest pain. Pt says she was watching tv when pain started; pt says her HR at home was at one point 184; says it feels like it's come down some now; pt points to center of chest when showing where pain is located; constant sharp pain; non radiating; pt talking in complete coherent sentences;    Allergies No Known Allergies  Level of Care/Admitting Diagnosis ED Disposition    ED Disposition Condition Comment   Admit  The patient appears reasonably stabilized for admission considering the current resources, flow, and capabilities available in the ED at this time, and I doubt any other [facility] requiring further screening and/or treatment in the ED prior to admission is  present.       B Medical/Surgery History Past Medical History:  Diagnosis Date  . Abnormal thyroid function test 05/16/2016  . Anxiety   . Atrial fibrillation ([place])   . 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 Decreased libido 05/16/2016  . Dysrhythmia   . Hypertension   . Hypokalemia   . Impaired fasting glucose   . Morbid obesity ([place]) 06/01/2015  . Morbid obesity ([place])   . Plantar fasciitis    Past Surgical History:  Procedure Laterality Date  . BREAST BIOPSY Right 2015   benign  . ESOPHAGOGASTRODUODENOSCOPY (EGD) WITH PROPOFOL N/A 08/12/2018   Procedure: ESOPHAGOGASTRODUODENOSCOPY (EGD) WITH PROPOFOL;  Surgeon: Toledo, Benay Pike, MD;  Location: [facility] ENDOSCOPY;  Service: Gastroenterology;  Laterality: N/A;  . HAMMER TOE SURGERY       A IV Location/Drains/Wounds Patient Lines/Drains/Airways Status   Active  Line/Drains/Airways    Name:   Placement date:   Placement time:   Site:   Days:   Peripheral IV 06/04/18 Right Antecubital   06/04/18    1938    Antecubital   283   Peripheral IV 03/13/19 Left Hand   03/13/19    2343    Hand   1          Intake/Output Last 24 hours  Intake/Output Summary (Last 24 hours) at 03/14/2019 0043 Last data filed at 03/13/2019 2346 Gross per 24 hour  Intake 10 ml  Output -  Net 10 ml    Labs/Imaging Results for orders placed or performed during the hospital encounter of 03/13/19 (from the past 48 hour(s))  Troponin I - ONCE - STAT     Status: None   Collection Time: 03/13/19 11:37 PM  Result Value Ref Range   Troponin I <0.03 <0.03 ng/mL    Comment: Performed at [facility], [address] [place], [place] [address]  Comprehensive metabolic panel     Status: Abnormal   Collection Time: 03/13/19 11:37 PM  Result Value Ref Range   Sodium 139 135 - 145 mmol/L   Potassium 3.4 (L) 3.5 - 5.1 mmol/L   Chloride 106 98 - 111 mmol/L   CO2 23 22 - 32 mmol/L   Glucose, Bld 133 (H) 70 - 99 mg/dL   BUN 13 6 - 20 mg/dL   Creatinine, Ser 0.91 0.44 - 1.00 mg/dL   Calcium 8.8 (L) 8.9 -  10.3 mg/dL   Total Protein 7.7 6.5 - 8.1 g/dL   Albumin 3.7 3.5 - 5.0 g/dL   AST 24 15 - 41 U/L   ALT 25 0 - 44 U/L   Alkaline Phosphatase 55 38 - 126 U/L   Total Bilirubin 0.4 0.3 - 1.2 mg/dL   GFR calc non Af Amer >60 >60 mL/min   GFR calc Af Amer >60 >60 mL/min   Anion gap 10 5 - 15    Comment: Performed at [facility], [address] [place], [place] [address]  CBC with Differential     Status: Abnormal   Collection Time: 03/13/19 11:37 PM  Result Value Ref Range   WBC 9.9 4.0 - 10.5 K/uL   RBC 4.20 3.87 - 5.11 MIL/uL   Hemoglobin 13.1 12.0 - 15.0 g/dL   HCT 39.9 36.0 - 46.0 %   MCV 95.0 80.0 - 100.0 fL   MCH 31.2 26.0 - 34.0 pg   MCHC 32.8 30.0 - 36.0 g/dL   RDW 12.8 11.5 - 15.5 %   Platelets 286 150 - 400 K/uL   nRBC 0.0 0.0 - 0.2 %    Neutrophils Relative % 41 %   Neutro Abs 4.0 1.7 - 7.7 K/uL   Lymphocytes Relative 49 %   Lymphs Abs 4.8 (H) 0.7 - 4.0 K/uL   Monocytes Relative 6 %   Monocytes Absolute 0.6 0.1 - 1.0 K/uL   Eosinophils Relative 4 %   Eosinophils Absolute 0.4 0.0 - 0.5 K/uL   Basophils Relative 0 %   Basophils Absolute 0.0 0.0 - 0.1 K/uL   Immature Granulocytes 0 %   Abs Immature Granulocytes 0.03 0.00 - 0.07 K/uL    Comment: Performed at [facility], [address] [place], [place] [address]  TSH     Status: None   Collection Time: 03/13/19 11:37 PM  Result Value Ref Range   TSH 4.364 0.350 - 4.500 uIU/mL    Comment: Performed by a 3rd Generation assay with a functional sensitivity of <=0.01 uIU/mL. Performed at [facility], [address], [place], [place] [address]    Dg Chest Port 1 View  Result Date: 03/14/2019 CLINICAL DATA:  Initial evaluation for acute chest pain. EXAM: PORTABLE CHEST 1 VIEW COMPARISON:  Prior radiograph from 12/19/2018. FINDINGS: Cardiac and mediastinal silhouettes are stable in size and contour, and remain within normal limits. Lungs normally inflated. Mild central perihilar vascular congestion without overt pulmonary edema. No focal infiltrates. No pleural effusion. No pneumothorax. No acute osseous finding. IMPRESSION: 1. Mild central perihilar vascular congestion without overt pulmonary edema. 2. No other active cardiopulmonary disease. Electronically Signed   By: Jeannine Boga M.D.   On: 03/14/2019 00:01    Pending Labs Unresulted Labs (From admission, onward)   None      Vitals/Pain Today's Vitals   03/14/19 0038 03/14/19 0039 03/14/19 0040 03/14/19 0041  BP:      Pulse:      Resp: 20 17 18  (!) 21  Temp:      TempSrc:      SpO2:      Weight:      Height:      PainSc:        Isolation Precautions No active isolations  Medications Medications  diltiazem (CARDIZEM) 100 mg in dextrose 5 % 100 mL (1 mg/mL) infusion (15 mg/hr  Intravenous Rate/Dose Change 03/14/19 0042)  sodium chloride flush (NS) 0.9 % injection 3 mL (3 mLs Intravenous  Given 03/13/19 2346)  diltiazem (CARDIZEM) injection 10 mg (10 mg Intravenous Given 03/13/19 2347)    Mobility walks Low fall risk   Focused Assessments Cardiac Assessment Handoff:  Cardiac Rhythm: Atrial fibrillation Lab Results  Component Value Date   TROPONINI <0.03 03/13/2019   No results found for: DDIMER Does the Patient currently have chest pain? Yes     R Recommendations: See Admitting Provider Note  Report given to:   Additional Notes:  Afib once in dec 2018; states meds (no cardioversion) eventually switched pt back into NSR in 2018; came in Afib RVR at 199 today; cardizem push total of 25; cardizem gtt at 15 currently; HTN; A&Ox4; RA; L hand 22g IV; frequent urination in small amounts.

## 2019-03-14 NOTE — ED Notes (Signed)
Pt states has to urinate. Assisted to bedside toilet carefully. States recent frequency with urine and only urinating a little amount at a time.

## 2019-03-14 NOTE — ED Notes (Signed)
Upon entrance to room pt on phone to let her work know she is being admitted.

## 2019-03-14 NOTE — ED Notes (Signed)
Verb order from [facility], MD to call pharm for Amio gtt if pt's HR goes above 110 sustained. Currently Afib at 96.

## 2019-03-14 NOTE — H&P (Addendum)
[facility] at [place] NAME: Lori Oliver    MR#:  [id number]  DATE OF BIRTH:  March 04, 1971  DATE OF ADMISSION:  03/13/2019  PRIMARY CARE PHYSICIAN: Hubbard Hartshorn, FNP   REQUESTING/REFERRING PHYSICIAN: Corinna Capra, MD CHIEF COMPLAINT:   Chief Complaint  Patient presents with  . Chest Pain    HISTORY OF PRESENT ILLNESS:  Lori Oliver  is a 48 y.o. female with a known history of multiple medical problems that will be mentioned below including atrial fibrillation, who presented to the emergency room with the onset of midsternal chest pain felt as a sharp pain with associated palpitations while watching TV.  She denied any associated dyspnea orthopnea or paroxysmal nocturnal dyspnea or worsening lower extremity edema.  She stated she has been having urgency for bowel movements this evening with no loose or watery bowel movements or melena or bright red blood per rectum.  No other bleeding diathesis.  She denied any headache or dizziness or blurred vision, paresthesias or focal muscle weakness.  She stated that she has been on Eliquis in the past and it was discontinued shortly after being started in December 2018 when she had her first episode of atrial fibrillation.  She denied any nausea or vomiting or abdominal pain.  No dysuria, oliguria or hematuria or flank pain.  Upon presentation to the emergency room, her heart rate is as fast as 163 with a blood pressure of 114/80 respiratory rate of 17 1025 oximetry of 95% on room air.  Her labs revealed mild hypokalemia of 3.4 and troponin I was less than 0.03 with normal TSH of 4.36.  Portable chest x-ray showed mild pulmonary vascular congestion without overt edema.  The patient was given 10 mg of IV Cardizem bolus followed by IV Cardizem drip.  She will be admitted to a stepdown unit for further evaluation and management.   PAST MEDICAL HISTORY:   Past Medical History:  Diagnosis Date  . Abnormal  thyroid function test 05/16/2016  . Anxiety   . Atrial fibrillation ([place])   . 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 Decreased libido 05/16/2016  . Dysrhythmia   . Hypertension   . Hypokalemia   . Impaired fasting glucose   . Morbid obesity ([place]) 06/01/2015  . Morbid obesity ([place])   . Plantar fasciitis     PAST SURGICAL HISTORY:   Past Surgical History:  Procedure Laterality Date  . BREAST BIOPSY Right 2015   benign  . ESOPHAGOGASTRODUODENOSCOPY (EGD) WITH PROPOFOL N/A 08/12/2018   Procedure: ESOPHAGOGASTRODUODENOSCOPY (EGD) WITH PROPOFOL;  Surgeon: Toledo, Benay Pike, MD;  Location: [facility] ENDOSCOPY;  Service: Gastroenterology;  Laterality: N/A;  . HAMMER TOE SURGERY      SOCIAL HISTORY:   Social History   Tobacco Use  . Smoking status: Never Smoker  . Smokeless tobacco: Never Used  Substance Use Topics  . Alcohol use: Yes    Alcohol/week: 0.0 standard drinks    Frequency: Never    Comment: occ    FAMILY HISTORY:   Family History  Problem Relation Age of Onset  . Hypertension Mother   . Heart disease Mother   . Arthritis Mother   . Depression Mother   . Stroke Maternal Grandmother     DRUG ALLERGIES:  No Known Allergies  REVIEW OF SYSTEMS:   ROS As per history of present illness. All pertinent systems were reviewed above. Constitutional,  HEENT, cardiovascular, respiratory, GI, GU, musculoskeletal, neuro, psychiatric, endocrine,  integumentary and hematologic systems were reviewed and are otherwise  negative/unremarkable except for positive findings mentioned above in the HPI.   MEDICATIONS AT HOME:   Prior to Admission medications   Medication Sig Start Date End Date Taking? Authorizing Provider  hydrochlorothiazide (MICROZIDE) 12.5 MG capsule Take 1 capsule (12.5 mg total) by mouth daily. 01/04/19  Yes Poulose, Bethel Born, NP  pantoprazole (PROTONIX) 40 MG tablet Take 40 mg by mouth daily.   Yes [provider]  Crisaborole (EUCRISA) 2 % OINT  Apply 1 application topically daily. Patient not taking: Reported on 03/14/2019 02/22/19   Fredderick Severance, NP      VITAL SIGNS:  Blood pressure 105/76, pulse 95, temperature 98.5 F (36.9 C), temperature source Oral, resp. rate (!) 23, height 5\' 6"  (1.676 m), weight 122.6 kg, last menstrual period 06/20/2016, SpO2 98 %.  PHYSICAL EXAMINATION:  Physical Exam  GENERAL:  48 y.o.-year-old middle-aged African-American patient lying in the bed with no acute distress.  EYES: Pupils equal, round, reactive to light and accommodation. No scleral icterus. Extraocular muscles intact.  HEENT: Head atraumatic, normocephalic. Oropharynx and nasopharynx clear.  NECK:  Supple, no jugular venous distention. No thyroid enlargement, no tenderness.  LUNGS: Normal breath sounds bilaterally, no wheezing, rales,rhonchi or crepitation. No use of accessory muscles of respiration.  CARDIOVASCULAR: Irregularly irregular tachycardic rhythm, S1, S2 normal. No murmurs, rubs, or gallops.  ABDOMEN: Soft, nontender, nondistended. Bowel sounds present. No organomegaly or mass.  EXTREMITIES: No pedal edema, cyanosis, or clubbing.  NEUROLOGIC: Cranial nerves II through XII are intact. Muscle strength 5/5 in all extremities. Sensation intact. Gait not checked.  PSYCHIATRIC: The patient is alert and oriented x 3.  SKIN: No obvious rash, lesion, or ulcer.   LABORATORY PANEL:   CBC Recent Labs  Lab 03/13/19 2337  WBC 9.9  HGB 13.1  HCT 39.9  PLT 286   ------------------------------------------------------------------------------------------------------------------  Chemistries  Recent Labs  Lab 03/13/19 2337  NA 139  K 3.4*  CL 106  CO2 23  GLUCOSE 133*  BUN 13  CREATININE 0.91  CALCIUM 8.8*  AST 24  ALT 25  ALKPHOS 55  BILITOT 0.4   ------------------------------------------------------------------------------------------------------------------  Cardiac Enzymes Recent Labs  Lab 03/13/19 2337   TROPONINI <0.03   ------------------------------------------------------------------------------------------------------------------  RADIOLOGY:  Dg Chest Port 1 View  Result Date: 03/14/2019 CLINICAL DATA:  Initial evaluation for acute chest pain. EXAM: PORTABLE CHEST 1 VIEW COMPARISON:  Prior radiograph from 12/19/2018. FINDINGS: Cardiac and mediastinal silhouettes are stable in size and contour, and remain within normal limits. Lungs normally inflated. Mild central perihilar vascular congestion without overt pulmonary edema. No focal infiltrates. No pleural effusion. No pneumothorax. No acute osseous finding. IMPRESSION: 1. Mild central perihilar vascular congestion without overt pulmonary edema. 2. No other active cardiopulmonary disease. Electronically Signed   By: Jeannine Boga M.D.   On: 03/14/2019 00:01      IMPRESSION AND PLAN:   #1.  Paroxysmal atrial fibrillation with rapid ventricular response.  The patient will be admitted to a stepdown unit.  I gave her 0.25 mg of IV digoxin as her blood pressure was borderline without significant help.  Blood pressure then improved and she was given another dose of 10 mg of IV Cardizem with improvement of heart rate from 129 to heart rate of 90-110.  Her IV Cardizem drip was titrated to max of 15 mg/h.  IV amiodarone was contemplated however has not been started yet.  Serial enzymes will be followed.  Will obtain  a 2D echo and a cardiology consultation in a.m. by Dr. [name].  I contacted him regarding the patient.  I also discussed the case with Dr. Jimmy Footman, the ICU intensivist on-call  2.  Chest pain.  This likely secondary to her atrial fibrillation with rapid ventricular response.  Pain has been improving with heart rate control.  We will follow serial cardiac enzymes.  Cardiology consult will be obtained.  3.  Hypokalemia.  This could be contributing to #1.  Potassium will be replaced and magnesium level will be checked.  4.   Hypertension.  Given her borderline blood pressure I will hold off her HCTZ specially given her high pro kalemia.  5.  GERD.  Her PPI therapy will be resumed.  6.  DVT prophylaxis.  This will be provided with subcutaneous Lovenox and GI prophylaxis was addressed above.  All the records are reviewed and case discussed with ED provider. Management plans discussed with the patient and she is in agreement.  CODE STATUS: Full  TOTAL TIME TAKING CARE OF THIS PATIENT: 50 minutes.    Christel Mormon M.D on 03/14/2019 at 4:40 AM  Pager - [phone]  After 6pm go to [email or web address] - Proofreader  [facility]  Office  [phone]  CC: Primary care physician; Hubbard Hartshorn, FNP   Note: This dictation was prepared with Dragon dictation along with smaller phrase technology. Any transcriptional errors that result from this process are unintentional.

## 2019-03-14 NOTE — ED Notes (Signed)
Pt up to use bedside toilet.

## 2019-03-14 NOTE — Progress Notes (Addendum)
Lori Oliver to be D/C'd Home per MD order.  Discussed prescriptions and follow up appointments with the patient. Prescriptions given to patient, medication list explained in detail. Pt verbalized understanding. Advised patient to call Dr. Donivan Scull office tomorrow to schedule f/u appointment  And to call for Holter monitor.  Allergies as of 03/14/2019   No Known Allergies     Medication List    TAKE these medications   Crisaborole 2 % Oint Commonly known as:  [place] Apply 1 application topically daily.   diltiazem 30 MG tablet Commonly known as:  CARDIZEM Take 1 tablet (30 mg total) by mouth 3 (three) times daily as needed (for HR > 120).   hydrochlorothiazide 12.5 MG capsule Commonly known as:  MICROZIDE Take 1 capsule (12.5 mg total) by mouth daily.   pantoprazole 40 MG tablet Commonly known as:  PROTONIX Take 40 mg by mouth daily.       Vitals:   03/14/19 0900 03/14/19 1031  BP: 116/76 112/72  Pulse: 80 78  Resp: 14 18  Temp:  98.2 F (36.8 C)  SpO2: 97% 99%    Tele box removed and returned. Skin clean, dry and intact without evidence of skin break down, no evidence of skin tears noted. IV catheter discontinued intact. Site without signs and symptoms of complications. Dressing and pressure applied. Pt denies pain at this time. No complaints noted.  An After Visit Summary was printed and given to the patient. Patient escorted via [name], and D/C home via private auto.  Lori Oliver

## 2019-03-14 NOTE — Discharge Summary (Signed)
[facility] at [place] NAME: Lori Oliver    MR#:  [id number]  DATE OF BIRTH:  [birth year]  DATE OF ADMISSION:  03/13/2019 ADMITTING PHYSICIAN: Minna Merritts, MD  DATE OF DISCHARGE: 03/14/2019   PRIMARY CARE PHYSICIAN: Hubbard Hartshorn, FNP    ADMISSION DIAGNOSIS:  Atrial fibrillation with RVR ([place]) [I48.91] Chest pain [R07.9] Chest pain, unspecified type [R07.9] Congestive heart failure, unspecified HF chronicity, unspecified heart failure type ([place]) [I50.9]  DISCHARGE DIAGNOSIS:  Active Problems:   Atrial fibrillation with rapid ventricular response ([place])   SECONDARY DIAGNOSIS:   Past Medical History:  Diagnosis Date  . Abnormal thyroid function test 05/16/2016  . Anxiety   . Atrial fibrillation ([place])   . 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 Decreased libido 05/16/2016  . Dysrhythmia   . Hypertension   . Hypokalemia   . Impaired fasting glucose   . Morbid obesity ([place]) 06/01/2015  . Morbid obesity ([place])   . Plantar fasciitis     HOSPITAL COURSE:   Patient was admitted with complaint of A. fib and RVR.  She responded to Cardizem injections and then Cardizem drip.  She converted to normal sinus rhythm so drip was stopped.  She had history of A. fib in 2018 but she was advised by cardiologist to take her propranolol as needed basis.  For last few days she had episodes on and off of A. Fib. She was seen by Dr. Vilinda Blanks to discharge at home with Cardizem 30 mg tablet as needed basis and his office will order 14-day Holter monitor as outpatient.  DISCHARGE CONDITIONS:   Stable CONSULTS OBTAINED:  Treatment Team:  Minna Merritts, MD  DRUG ALLERGIES:  No Known Allergies  DISCHARGE MEDICATIONS:   Allergies as of 03/14/2019   No Known Allergies     Medication List    TAKE these medications   Crisaborole 2 % Oint Commonly known as:  Nepal Apply 1 application topically daily.   diltiazem 30 MG  tablet Commonly known as:  CARDIZEM Take 1 tablet (30 mg total) by mouth 3 (three) times daily as needed (for HR > 120).   hydrochlorothiazide 12.5 MG capsule Commonly known as:  MICROZIDE Take 1 capsule (12.5 mg total) by mouth daily.   pantoprazole 40 MG tablet Commonly known as:  PROTONIX Take 40 mg by mouth daily.        DISCHARGE INSTRUCTIONS:    Follow with primary care physician and cardiologist in 1 week.  If you experience worsening of your admission symptoms, develop shortness of breath, life threatening emergency, suicidal or homicidal thoughts you must seek medical attention immediately by calling 911 or calling your MD immediately  if symptoms less severe.  You Must read complete instructions/literature along with all the possible adverse reactions/side effects for all the Medicines you take and that have been prescribed to you. Take any new Medicines after you have completely understood and accept all the possible adverse reactions/side effects.   Please note  You were cared for by a hospitalist during your hospital stay. If you have any questions about your discharge medications or the care you received while you were in the hospital after you are discharged, you can call the unit and asked to speak with the hospitalist on call if the hospitalist that took care of you is not available. Once you are discharged, your primary care physician will handle any further medical issues. Please note that NO REFILLS for  any discharge medications will be authorized once you are discharged, as it is imperative that you return to your primary care physician (or establish a relationship with a primary care physician if you do not have one) for your aftercare needs so that they can reassess your need for medications and monitor your lab values.    Today   CHIEF COMPLAINT:   Chief Complaint  Patient presents with  . Chest Pain    HISTORY OF PRESENT ILLNESS:  Lori Oliver  is a  48 y.o. female with a known history of multiple medical problems that will be mentioned below including atrial fibrillation, who presented to the emergency room with the onset of midsternal chest pain felt as a sharp pain with associated palpitations while watching TV.  She denied any associated dyspnea orthopnea or paroxysmal nocturnal dyspnea or worsening lower extremity edema.  She stated she has been having urgency for bowel movements this evening with no loose or watery bowel movements or melena or bright red blood per rectum.  No other bleeding diathesis.  She denied any headache or dizziness or blurred vision, paresthesias or focal muscle weakness.  She stated that she has been on Eliquis in the past and it was discontinued shortly after being started in December 2018 when she had her first episode of atrial fibrillation.  She denied any nausea or vomiting or abdominal pain.  No dysuria, oliguria or hematuria or flank pain.  Upon presentation to the emergency room, her heart rate is as fast as 163 with a blood pressure of 114/80 respiratory rate of 17 1025 oximetry of 95% on room air.  Her labs revealed mild hypokalemia of 3.4 and troponin I was less than 0.03 with normal TSH of 4.36.  Portable chest x-ray showed mild pulmonary vascular congestion without overt edema.  The patient was given 10 mg of IV Cardizem bolus followed by IV Cardizem drip.  She will be admitted to a stepdown unit for further evaluation and management.     VITAL SIGNS:  Blood pressure 112/72, pulse 78, temperature 98.2 F (36.8 C), temperature source Oral, resp. rate 18, height 5\' 6"  (1.676 m), weight 123.2 kg, last menstrual period 06/20/2016, SpO2 99 %.  I/O:    Intake/Output Summary (Last 24 hours) at 03/14/2019 1142 Last data filed at 03/14/2019 0800 Gross per 24 hour  Intake 116.01 ml  Output -  Net 116.01 ml    PHYSICAL EXAMINATION:  GENERAL:  48 y.o.-year-old patient lying in the bed with no acute distress.   EYES: Pupils equal, round, reactive to light and accommodation. No scleral icterus. Extraocular muscles intact.  HEENT: Head atraumatic, normocephalic. Oropharynx and nasopharynx clear.  NECK:  Supple, no jugular venous distention. No thyroid enlargement, no tenderness.  LUNGS: Normal breath sounds bilaterally, no wheezing, rales,rhonchi or crepitation. No use of accessory muscles of respiration.  CARDIOVASCULAR: S1, S2 normal. No murmurs, rubs, or gallops.  ABDOMEN: Soft, non-tender, non-distended. Bowel sounds present. No organomegaly or mass.  EXTREMITIES: No pedal edema, cyanosis, or clubbing.  NEUROLOGIC: Cranial nerves II through XII are intact. Muscle strength 5/5 in all extremities. Sensation intact. Gait not checked.  PSYCHIATRIC: The patient is alert and oriented x 3.  SKIN: No obvious rash, lesion, or ulcer.   DATA REVIEW:   CBC Recent Labs  Lab 03/14/19 0513  WBC 8.5  HGB 12.8  HCT 38.7  PLT 292    Chemistries  Recent Labs  Lab 03/13/19 2337 03/14/19 0513  NA 139 137  K 3.4* 3.8  CL 106 106  CO2 23 23  GLUCOSE 133* 123*  BUN 13 14  CREATININE 0.91 0.82  CALCIUM 8.8* 8.5*  MG  --  2.0  AST 24  --   ALT 25  --   ALKPHOS 55  --   BILITOT 0.4  --     Cardiac Enzymes Recent Labs  Lab 03/14/19 0513  TROPONINI 0.04*    Microbiology Results  Results for orders placed or performed during the hospital encounter of 03/13/19  MRSA PCR Screening     Status: None   Collection Time: 03/14/19  4:00 AM  Result Value Ref Range Status   MRSA by PCR NEGATIVE NEGATIVE Final    Comment:        The GeneXpert MRSA Assay (FDA approved for NASAL specimens only), is one component of a comprehensive MRSA colonization surveillance program. It is not intended to diagnose MRSA infection nor to guide or monitor treatment for MRSA infections. Performed at [facility], [address] [place], [place] [address]     RADIOLOGY:  Dg Chest Port 1  View  Result Date: 03/14/2019 CLINICAL DATA:  Initial evaluation for acute chest pain. EXAM: PORTABLE CHEST 1 VIEW COMPARISON:  Prior radiograph from 12/19/2018. FINDINGS: Cardiac and mediastinal silhouettes are stable in size and contour, and remain within normal limits. Lungs normally inflated. Mild central perihilar vascular congestion without overt pulmonary edema. No focal infiltrates. No pleural effusion. No pneumothorax. No acute osseous finding. IMPRESSION: 1. Mild central perihilar vascular congestion without overt pulmonary edema. 2. No other active cardiopulmonary disease. Electronically Signed   By: Jeannine Boga M.D.   On: 03/14/2019 00:01    EKG:   Orders placed or performed during the hospital encounter of 03/13/19  . ED EKG  . ED EKG  . EKG 12-Lead  . EKG 12-Lead  . EKG 12-Lead  . EKG 12-Lead      Management plans discussed with the patient, family and they are in agreement.  CODE STATUS:     Code Status Orders  (From admission, onward)         Start     Ordered   03/14/19 0134  Full code  Continuous     03/14/19 0136        Code Status History    Date Active Date Inactive Code Status Order ID Comments User Context   12/01/2017 0614 12/02/2017 2001 Full Code [id number]  Harrie Foreman, MD Inpatient      TOTAL TIME TAKING CARE OF THIS PATIENT: 35 minutes.    Vaughan Basta M.D on 03/14/2019 at 11:42 AM  Between 7am to 6pm - Pager - [phone]  After 6pm go to [email or web address] - password [email or web address] [facility]  Office  [phone]  CC: Primary care physician; Hubbard Hartshorn, FNP   Note: This dictation was prepared with Dragon dictation along with smaller phrase technology. Any transcriptional errors that result from this process are unintentional.

## 2019-03-14 NOTE — Consult Note (Signed)
PULMONARY / CRITICAL CARE MEDICINE  Name: Lori Oliver MRN: [id number] DOB: [birth year]    LOS: 0  Referring Provider: Dr. Sidney Ace Reason for Referral: A. fib with RVR  HPI: 48 year old African-American female with a medical history as indicated below who presented with palpitations, shortness of breath and chest pain.  She was found to be in A. fib with RVR.  She was started on a diltiazem drip and admitted to the ICU for further management.  She is currently on room air and reports significant improvement in symptoms. She sees Dr. [name].  She is not currently on any medications for atrial fibrillation even though in the past she had been prescribed a beta-blocker as well as anticoagulation.  She states that she has not required any medications in over a year.  Past Medical History:  Diagnosis Date  . Abnormal thyroid function test 05/16/2016  . Anxiety   . Atrial fibrillation ([place])   . 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 Decreased libido 05/16/2016  . Dysrhythmia   . Hypertension   . Hypokalemia   . Impaired fasting glucose   . Morbid obesity ([place]) 06/01/2015  . Morbid obesity ([place])   . Plantar fasciitis    Past Surgical History:  Procedure Laterality Date  . BREAST BIOPSY Right 2015   benign  . ESOPHAGOGASTRODUODENOSCOPY (EGD) WITH PROPOFOL N/A 08/12/2018   Procedure: ESOPHAGOGASTRODUODENOSCOPY (EGD) WITH PROPOFOL;  Surgeon: Toledo, Benay Pike, MD;  Location: [facility] ENDOSCOPY;  Service: Gastroenterology;  Laterality: N/A;  . HAMMER TOE SURGERY     Prior to Admission medications   Medication Sig Start Date End Date Taking? Authorizing Provider  amLODipine (NORVASC) 5 MG tablet Take 5 mg by mouth daily.   Yes [provider]  clopidogrel (PLAVIX) 75 MG tablet Take 75 mg by mouth daily.   Yes [provider]  donepezil (ARICEPT) 5 MG tablet Take 1 tablet (5 mg total) by mouth at bedtime. 08/10/18 09/19/18 Yes Sowles, Drue Stager, MD  empagliflozin (JARDIANCE) 25 MG  TABS tablet Take 25 mg by mouth daily.   Yes [provider]  glycopyrrolate (ROBINUL) 1 MG tablet Take 1 mg by mouth 2 (two) times daily.   Yes [provider]  insulin aspart (NOVOLOG FLEXPEN) 100 UNIT/ML FlexPen Inject 12 Units into the skin 2 (two) times daily.   Yes [provider]  insulin aspart (NOVOLOG) 100 UNIT/ML FlexPen Inject 18 Units into the skin daily. At 1700   Yes [provider]  Insulin Degludec-Liraglutide (XULTOPHY) 100-3.6 UNIT-MG/ML SOPN Inject 50 Units into the skin daily.   Yes [provider]  levETIRAcetam (KEPPRA) 500 MG tablet Take 500 mg by mouth 2 (two) times daily.   Yes [provider]  lipase/protease/amylase (CREON) 12000 units CPEP capsule Take 6,000 Units by mouth 3 (three) times daily before meals.   Yes [provider]  lipase/protease/amylase (CREON) 12000 units CPEP capsule Take 3,000 Units by mouth at bedtime. With snack   Yes [provider]  lisinopril (PRINIVIL,ZESTRIL) 5 MG tablet Take 5 mg by mouth daily.   Yes [provider]  metoprolol succinate (TOPROL-XL) 25 MG 24 hr tablet Take 1 tablet (25 mg total) by mouth daily. 08/10/18  Yes Sowles, Drue Stager, MD  rosuvastatin (CRESTOR) 40 MG tablet Take 1 tablet (40 mg total) by mouth daily. 08/10/18 09/19/18 Yes Steele Sizer, MD  aspirin EC 81 MG tablet Take 81 mg by mouth daily.    [provider]  famotidine (PEPCID) 20 MG tablet  Take 1 tablet (20 mg total) by mouth 2 (two) times daily. 08/10/18 09/09/18  Steele Sizer, MD  gabapentin (NEURONTIN) 300 MG capsule Take 1 capsule (300 mg total) by mouth 2 (two) times daily. 08/10/18 09/09/18  Steele Sizer, MD  insulin glargine (LANTUS) 100 UNIT/ML injection Inject 0.1 mLs (10 Units total) into the skin daily. 08/10/18 09/09/18  Steele Sizer, MD  lacosamide 100 MG TABS Take 1 tablet (100 mg total) by mouth 2 (two) times daily. Patient not taking: Reported on 09/19/2018  12/26/17   Fritzi Mandes, MD  promethazine (PHENERGAN) 12.5 MG tablet Take 1 tablet (12.5 mg total) by mouth every 6 (six) hours as needed for nausea or vomiting. Patient not taking: Reported on 09/19/2018 10/14/17   Stark Klein, MD  sertraline (ZOLOFT) 25 MG tablet Take 1 tablet (25 mg total) by mouth daily. Patient not taking: Reported on 09/19/2018 08/10/18   Steele Sizer, MD   Allergies No Known Allergies  Family History Family History  Problem Relation Age of Onset  . Hypertension Mother   . Heart disease Mother   . Arthritis Mother   . Depression Mother   . Stroke Maternal Grandmother    Social History  reports that she has never smoked. She has never used smokeless tobacco. She reports current alcohol use. She reports that she does not use drugs.  Review Of Systems: All systems reviewed.  Pertinent positives include palpitations, chest pain and shortness of breath all of which have resolved.  All other systems are negative  VITAL SIGNS: BP 105/76   Pulse 95   Temp 98.5 F (36.9 C) (Oral)   Resp (!) 23   Ht 5\' 6"  (1.676 m)   Wt 122.6 kg   LMP 06/20/2016   SpO2 98%   BMI 43.62 kg/m   HEMODYNAMICS:    VENTILATOR SETTINGS:    INTAKE / OUTPUT: No intake/output data recorded.  PHYSICAL EXAMINATION: General: No acute distress HEENT: PERRLA, trachea midline, no JVD Neuro: Alert and oriented x4, no focal deficits Cardiovascular: RRR, S1-S2, no murmur regurg or gallop, +2 pulses bilaterally, no edema Lungs: Clear to auscultation bilaterally Abdomen: Nondistended, normal bowel sounds in all 4 quadrants Musculoskeletal: No deformities Skin: Warm and dry  LABS:  BMET Recent Labs  Lab 03/13/19 2337  NA 139  K 3.4*  CL 106  CO2 23  BUN 13  CREATININE 0.91  GLUCOSE 133*    Electrolytes Recent Labs  Lab 03/13/19 2337  CALCIUM 8.8*    CBC Recent Labs  Lab 03/13/19 2337  WBC 9.9  HGB 13.1  HCT 39.9  PLT 286    Coag's No results for input(s):  APTT, INR in the last 168 hours.  Sepsis Markers No results for input(s): LATICACIDVEN, PROCALCITON, O2SATVEN in the last 168 hours.  ABG No results for input(s): PHART, PCO2ART, PO2ART in the last 168 hours.  Liver Enzymes Recent Labs  Lab 03/13/19 2337  AST 24  ALT 25  ALKPHOS 55  BILITOT 0.4  ALBUMIN 3.7    Cardiac Enzymes Recent Labs  Lab 03/13/19 2337  TROPONINI <0.03    Glucose Recent Labs  Lab 03/14/19 0357  GLUCAP 116*    Imaging Dg Chest Port 1 View  Result Date: 03/14/2019 CLINICAL DATA:  Initial evaluation for acute chest pain. EXAM: PORTABLE CHEST 1 VIEW COMPARISON:  Prior radiograph from 12/19/2018. FINDINGS: Cardiac and mediastinal silhouettes are stable in size and contour, and remain within normal limits. Lungs normally inflated. Mild central perihilar vascular congestion  without overt pulmonary edema. No focal infiltrates. No pleural effusion. No pneumothorax. No acute osseous finding. IMPRESSION: 1. Mild central perihilar vascular congestion without overt pulmonary edema. 2. No other active cardiopulmonary disease. Electronically Signed   By: Jeannine Boga M.D.   On: 03/14/2019 00:01    STUDIES:  2D echo pending  SIGNIFICANT EVENTS: 03/14/2019: Admitted  LINES/TUBES: Peripheral IV   ASSESSMENT  A. fib with RVR Morbid obesity PLAN Hemodynamic monitoring per ICU protocol Diltiazem infusion Cardiology consult 2D echo Cycle cardiac enzymes Monitor and correct electrolytes  Best Practice: Code Status: Full code Diet: Heart healthy diet GI prophylaxis: Not indicated VTE prophylaxis: Subcu Lovenox  FAMILY  - Updates: No family at bedside.  Plan of care reviewed with patient  Draco Malczewski S. [facility] ANP-BC Pulmonary and Critical Care Medicine [facility] Pager [phone] or [phone]  NB: This document was prepared using Dragon voice recognition software and may include unintentional dictation errors.    03/14/2019,  4:52 AM

## 2019-03-14 NOTE — ED Notes (Signed)
Pt states CP easing off.

## 2019-03-14 NOTE — Consult Note (Signed)
Cardiology Consultation:   Patient ID: Lori Oliver MRN: [id number]; DOB: 1971-09-02  Admit date: 03/13/2019 Date of Consult: 03/14/2019  Primary Care Provider: Hubbard Hartshorn, FNP Primary Cardiologist: Esmond Plants Physician requesting consult: Dr. Eugenie Norrie Reason for consult: Atrial fibrillation with RVR   Patient Profile:   Lori Oliver is a 48 y.o. female with a hx of obesity, atrial fibrillation paroxysmal, borderline hypertension, GERD who is being seen today for atrial fibrillation with RVR  History of Present Illness:   [name] reports that she was sitting on her couch last night at rest watching television with her husband when she had acute onset of tachycardia with some chest tightness shortness of breath.  She does have propranolol but did not think to take this Last episode of atrial fibrillation December 2018 after taking Sudafed and other cough medicine  Since that time she has felt well Only recently with episodes of tachycardia that were short-lived heart rates into the 120s of unclear etiology The symptoms would typically resolve by sitting down relaxing and rhythm would go back to normal She noticed these episodes of paroxysmal tachycardia on her fitbit She has had several of these episodes over the past 2 weeks  She is distressed about recent weight gain according to her numbers is up 20 pounds Lots of stress at home, husband with cancer and she is helping to take care of him She continues to work  Discussed prior sleep study suggesting mild sleep apnea at that time over year ago, she declined CPAP  She has had prior outpatient stress testing over a year ago  CT scan chest reviewed  from 2018 showing no coronary calcification or aortic atherosclerosis  Past Medical History:  Diagnosis Date  . Abnormal thyroid function test 05/16/2016  . Anxiety   . Atrial fibrillation ([place])   . 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 Decreased libido 05/16/2016   . Dysrhythmia   . Hypertension   . Hypokalemia   . Impaired fasting glucose   . Morbid obesity ([place]) 06/01/2015  . Morbid obesity ([place])   . Plantar fasciitis     Past Surgical History:  Procedure Laterality Date  . BREAST BIOPSY Right 2015   benign  . ESOPHAGOGASTRODUODENOSCOPY (EGD) WITH PROPOFOL N/A 08/12/2018   Procedure: ESOPHAGOGASTRODUODENOSCOPY (EGD) WITH PROPOFOL;  Surgeon: Toledo, Benay Pike, MD;  Location: [facility] ENDOSCOPY;  Service: Gastroenterology;  Laterality: N/A;  . HAMMER TOE SURGERY       Home Medications:  Prior to Admission medications   Medication Sig Start Date End Date Taking? Authorizing Provider  hydrochlorothiazide (MICROZIDE) 12.5 MG capsule Take 1 capsule (12.5 mg total) by mouth daily. 01/04/19  Yes Poulose, Bethel Born, NP  pantoprazole (PROTONIX) 40 MG tablet Take 40 mg by mouth daily.   Yes [provider]  Crisaborole (EUCRISA) 2 % OINT Apply 1 application topically daily. Patient not taking: Reported on 03/14/2019 02/22/19   Fredderick Severance, NP  diltiazem (CARDIZEM) 30 MG tablet Take 1 tablet (30 mg total) by mouth 3 (three) times daily as needed (for HR > 120). 03/14/19   Vaughan Basta, MD    Inpatient Medications: Scheduled Meds: . diltiazem  30 mg Oral Q12H  . enoxaparin (LOVENOX) injection  40 mg Subcutaneous Q24H  . pantoprazole  40 mg Oral Daily  . potassium chloride  20 mEq Oral Once   Continuous Infusions:  PRN Meds: acetaminophen **OR** acetaminophen, magnesium hydroxide, ondansetron **OR** ondansetron (ZOFRAN) IV, traZODone  Allergies:  No Known Allergies  Social History:   Social History   Socioeconomic History  . Marital status: Married    Spouse name: Coralyn Mark  . Number of children: 1  . Years of education: 33  . Highest education level: Some college, no degree  Occupational History  . Not on file  Social Needs  . Financial resource strain: Not hard at all  . Food insecurity:    Worry: Never true     Inability: Never true  . Transportation needs:    Medical: No    Non-medical: No  Tobacco Use  . Smoking status: Never Smoker  . Smokeless tobacco: Never Used  Substance and Sexual Activity  . Alcohol use: Yes    Alcohol/week: 0.0 standard drinks    Frequency: Never    Comment: occ  . Drug use: No  . Sexual activity: Yes    Partners: Male    Birth control/protection: Implant, None  Lifestyle  . Physical activity:    Days per week: 0 days    Minutes per session: 0 min  . Stress: Not at all  Relationships  . Social connections:    Talks on phone: Once a week    Gets together: Once a week    Attends religious service: More than 4 times per year    Active member of club or organization: Yes    Attends meetings of clubs or organizations: More than 4 times per year    Relationship status: Married  . Intimate partner violence:    Fear of current or ex partner: No    Emotionally abused: No    Physically abused: No    Forced sexual activity: No  Other Topics Concern  . Not on file  Social History Narrative   Husband was diagnosed with Cancer this past Summer.    Family History:   * Family History  Problem Relation Age of Onset  . Hypertension Mother   . Heart disease Mother   . Arthritis Mother   . Depression Mother   . Stroke Maternal Grandmother      ROS:  Please see the history of present illness.  Review of Systems  Constitutional: Negative.   Respiratory: Positive for shortness of breath.   Cardiovascular: Positive for palpitations.       Tachycardia  Gastrointestinal: Negative.   Musculoskeletal: Negative.   Neurological: Negative.   Psychiatric/Behavioral: Negative.   All other systems reviewed and are negative.      Physical Exam/Data:   Vitals:   03/14/19 0400 03/14/19 0800 03/14/19 0900 03/14/19 1031  BP: 105/76 113/84 116/76 112/72  Pulse: 95 92 80 78  Resp: (!) 23 16 14 18   Temp: 98.5 F (36.9 C) 98.4 F (36.9 C)  98.2 F (36.8 C)   TempSrc: Oral Oral  Oral  SpO2: 98% 98% 97% 99%  Weight: 122.6 kg   123.2 kg  Height: 5\' 6"  (1.676 m)   5\' 6"  (1.676 m)    Intake/Output Summary (Last 24 hours) at 03/14/2019 1516 Last data filed at 03/14/2019 1300 Gross per 24 hour  Intake 356.01 ml  Output -  Net 356.01 ml   Last 3 Weights 03/14/2019 03/14/2019 03/13/2019  Weight (lbs) 271 lb 8 oz 270 lb 4.5 oz 273 lb 5.9 oz  Weight (kg) 123.152 kg 122.6 kg 124 kg     Body mass index is 43.82 kg/m.  General:  Well nourished, well developed, in no acute distress, morbidly obese HEENT: normal Lymph: no adenopathy Neck:  no JVD Endocrine:  No thryomegaly Vascular: No carotid bruits; FA pulses 2+ bilaterally without bruits  Cardiac:  normal S1, S2; RRR; no murmur  Lungs:  clear to auscultation bilaterally, no wheezing, rhonchi or rales  Abd: soft, nontender, no hepatomegaly  Ext: no edema Musculoskeletal:  No deformities, BUE and BLE strength normal and equal Skin: warm and dry  Neuro:  CNs 2-12 intact, no focal abnormalities noted Psych:  Normal affect   EKG:  The EKG was personally reviewed and demonstrates: Atrial fibrillation with RVR no significant ST or T wave changes -Repeat EKG performed this morning showing normal sinus rhythm  Telemetry:  Telemetry was personally reviewed and demonstrates: Converted to normal sinus rhythm overnight, now maintaining normal sinus rhythm  Relevant CV Studies: Echocardiogram today  1. The left ventricle has normal systolic function with an ejection fraction of 60-65%. The cavity size was normal. There is mild to moderately increased left ventricular wall thickness. Left ventricular diastolic Doppler parameters are indeterminate.  2. The right ventricle has normal systolic function. The cavity was normal. There is no increase in right ventricular wall thickness.  3. Rhythm is atrial fibrillation  Laboratory Data:  Chemistry Recent Labs  Lab 03/13/19 2337 03/14/19 0513  NA 139 137  K  3.4* 3.8  CL 106 106  CO2 23 23  GLUCOSE 133* 123*  BUN 13 14  CREATININE 0.91 0.82  CALCIUM 8.8* 8.5*  GFRNONAA >60 >60  GFRAA >60 >60  ANIONGAP 10 8    Recent Labs  Lab 03/13/19 2337  PROT 7.7  ALBUMIN 3.7  AST 24  ALT 25  ALKPHOS 55  BILITOT 0.4   Hematology Recent Labs  Lab 03/13/19 2337 03/14/19 0513  WBC 9.9 8.5  RBC 4.20 4.06  HGB 13.1 12.8  HCT 39.9 38.7  MCV 95.0 95.3  MCH 31.2 31.5  MCHC 32.8 33.1  RDW 12.8 12.9  PLT 286 292   Cardiac Enzymes Recent Labs  Lab 03/13/19 2337 03/14/19 0513 03/14/19 1103  TROPONINI <0.03 0.04* 0.04*   No results for input(s): TROPIPOC in the last 168 hours.  BNPNo results for input(s): BNP, PROBNP in the last 168 hours.  DDimer No results for input(s): DDIMER in the last 168 hours.  Radiology/Studies:  Dg Chest Port 1 View  Result Date: 03/14/2019 CLINICAL DATA:  Initial evaluation for acute chest pain. EXAM: PORTABLE CHEST 1 VIEW COMPARISON:  Prior radiograph from 12/19/2018. FINDINGS: Cardiac and mediastinal silhouettes are stable in size and contour, and remain within normal limits. Lungs normally inflated. Mild central perihilar vascular congestion without overt pulmonary edema. No focal infiltrates. No pleural effusion. No pneumothorax. No acute osseous finding. IMPRESSION: 1. Mild central perihilar vascular congestion without overt pulmonary edema. 2. No other active cardiopulmonary disease. Electronically Signed   By: Jeannine Boga M.D.   On: 03/14/2019 00:01    Assessment and Plan:   1. Paroxysmal atrial fibrillation Converted back to normal sinus rhythm on diltiazem No indication for aspirin chads vasc 2 (female and hypertension) She prefers not to be on anticoagulation -Risk factors include obesity, possible sleep apnea, hypertension She does have LVH on echocardiogram possibly secondary to longstanding hypertension --She has had recent episodes of paroxysmal tachycardia in the past week though  rates in the low 120 range, unclear if this is atrial tachycardia, unable to exclude atrial fibrillation --After long discussion with her concerning various treatment options, she would like to wear a extended monitor for 2 weeks before committing to daily medications  for rate and rhythm control --We recommended wearing a Zio to watch for paroxysmal atrial fibrillation or paroxysmal atrial tachycardia episodes --At the completion of her monitor, we could hold the HCTZ and start alternate medication such as diltiazem extended release 120 daily, other option would be metoprolol succinate -We will hold off on anticoagulation until monitor is reviewed -For now recommended she take diltiazem 30 mg pills as needed  2) essential hypertension Recommend she continue HCTZ for now with potassium supplement After monitor is completed, would likely change to diltiazem extended release 120 daily  3) morbid obesity Long discussion with her, she would like consultation with bariatric surgery, would like to even consider breast reduction surgery -Her weight is likely contributing to sleep apnea, high risk of atrial fibrillation,  also likely contributing to essential hypertension -She is watching her diet, discussed with her -Unable to exercise very much secondary to taking care of her sick husband and long work hours  4) left ventricular hypertrophy Likely secondary to longstanding hypertension  5) chest pain Ruled out for MI Chest tightness likely secondary to atrial fibrillation with RVR  Long discussion with her above including strategies for weight loss, Strategies for reducing her risk factors for atrial fibrillation Discussed risk and benefit of anticoagulation, Discussed management of hypertension, medication options available Discussed referrals to bariatric surgery, breast reduction surgery  Total encounter time more than 110 minutes  Greater than 50% was spent in counseling and coordination  of care with the patient   For questions or updates, please contact [facility] Please consult [email or web address] for contact info under     Signed, Ida Rogue, MD  03/14/2019 3:16 PM

## 2019-03-14 NOTE — ED Notes (Signed)
Checked in with CCU. States should not be much longer and that floor RN will call ED RN once ready for report.

## 2019-03-14 NOTE — ED Notes (Signed)
Pt given warm blankets.

## 2019-03-14 NOTE — ED Provider Notes (Signed)
[facility] Emergency Department Provider Note   ____________________________________________   First MD Initiated Contact with Patient 03/13/19 2334     (approximate)  I have reviewed the triage vital signs and the nursing notes.   HISTORY  Chief Complaint Chest Pain    HPI Memori COLBI SCHILTZ is a 48 y.o. female reports she was sitting on the couch watching TV and had sudden onset of chest pain palpitations.  She has a history of A. fib but has not had an attack for quite some time.  She had propranolol to take whenever it happened but she does not have it with her now.  Heart rate is in the 200-205 range currently.  Vagal maneuvers tried in the ER do not change it.  Patient has a little bit of caffeine a day 2 sodas.  She does not drink any other caffeine.  She does not eat chocolate.  She does not do drugs.  She does not drink any other caffeinated beverages.  She does not drink alcohol.  She does not take decongestants.  Chest pain is present is kind of dull and achy is been present since the start of the rapid heart rate.         Past Medical History:  Diagnosis Date  . Abnormal thyroid function test 05/16/2016  . Anxiety   . Atrial fibrillation ([place])   . 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 Decreased libido 05/16/2016  . Dysrhythmia   . Hypertension   . Hypokalemia   . Impaired fasting glucose   . Morbid obesity ([place]) 06/01/2015  . Morbid obesity ([place])   . Plantar fasciitis     Patient Active Problem List   Diagnosis Date Noted  . Intermittent atrial fibrillation ([place]) 02/19/2018  . Hypokalemia 02/10/2018  . Anxiety about health 01/13/2018  . Atrial fibrillation with RVR ([place]) 12/01/2017  . Vitamin D deficiency 07/08/2017  . Lower back pain 07/07/2017  . Screen for colon cancer 07/07/2017  . Morbid obesity ([place]) 07/07/2017  . Essential hypertension 07/11/2016  . Preventative health care 05/16/2016  . IFG (impaired fasting glucose)  05/16/2016  . 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 Abnormal thyroid function test 05/16/2016  . Decreased libido 05/16/2016  . Breast cancer screening 05/16/2016  . Breast lump on left side at 5 o'clock position 05/16/2016  . Contraception management 05/16/2016  . Surveillance for birth control, oral contraceptives 02/12/2016  . Bilateral thoracic back pain 06/01/2015  . Blood pressure elevated without history of HTN 06/01/2015  . History of breast lump 06/01/2015  . History of abnormal cervical Pap smear 06/01/2015    Past Surgical History:  Procedure Laterality Date  . BREAST BIOPSY Right 2015   benign  . ESOPHAGOGASTRODUODENOSCOPY (EGD) WITH PROPOFOL N/A 08/12/2018   Procedure: ESOPHAGOGASTRODUODENOSCOPY (EGD) WITH PROPOFOL;  Surgeon: Toledo, Benay Pike, MD;  Location: [facility] ENDOSCOPY;  Service: Gastroenterology;  Laterality: N/A;  . HAMMER TOE SURGERY      Prior to Admission medications   Medication Sig Start Date End Date Taking? Authorizing Provider  Crisaborole (EUCRISA) 2 % OINT Apply 1 application topically daily. 02/22/19   Poulose, Bethel Born, NP  hydrochlorothiazide (MICROZIDE) 12.5 MG capsule Take 1 capsule (12.5 mg total) by mouth daily. 01/04/19   Poulose, Bethel Born, NP    Allergies Patient has no known allergies.  Family History  Problem Relation Age of Onset  . Hypertension Mother   . Heart disease Mother   . Arthritis Mother   .  Depression Mother   . Stroke Maternal Grandmother     Social History Social History   Tobacco Use  . Smoking status: Never Smoker  . Smokeless tobacco: Never Used  Substance Use Topics  . Alcohol use: Yes    Alcohol/week: 0.0 standard drinks    Frequency: Never    Comment: occ  . Drug use: No    Review of Systems  Constitutional: No fever/chills Eyes: No visual changes. ENT: No sore throat. Cardiovascular:  chest pain. Respiratory: Denies shortness of breath. Gastrointestinal: No abdominal pain.  No nausea, no  vomiting.  No diarrhea.  No constipation. Genitourinary: Negative for dysuria. Musculoskeletal: Negative for back pain. Skin: Negative for rash. Neurological: Negative for headaches, focal weakness   ____________________________________________   PHYSICAL EXAM:  VITAL SIGNS: ED Triage Vitals  Enc Vitals Group     BP 03/13/19 2350 114/80     Pulse Rate 03/13/19 2338 87     Resp 03/13/19 2338 15     Temp 03/13/19 2356 98.3 F (36.8 C)     Temp Source 03/13/19 2356 Oral     SpO2 03/13/19 2338 99 %     Weight 03/13/19 2327 273 lb 5.9 oz (124 kg)     Height 03/13/19 2327 5\' 6"  (1.676 m)     Head Circumference --      Peak Flow --      Pain Score 03/13/19 2326 9     Pain Loc --      Pain Edu? --      Excl. in [place]? --    Constitutional: Alert and oriented. Well appearing and in no acute distress. Eyes: Conjunctivae are normal.  Head: Atraumatic. Nose: No congestion/rhinnorhea. Mouth/Throat: Mucous membranes are moist.  Oropharynx non-erythematous. Neck: No stridor.  Cardiovascular: Rapid rate, irregular rhythm. Grossly normal heart sounds.  Good peripheral circulation. Respiratory: Normal respiratory effort.  No retractions. Lungs CTAB. Gastrointestinal: Soft and nontender. No distention. No abdominal bruits. No CVA tenderness. Musculoskeletal: No lower extremity tenderness nor edema.  Neurologic:  Normal speech and language. No gross focal neurologic deficits are appreciated. Skin:  Skin is warm, dry and intact. No rash noted.   ____________________________________________   LABS (all labs ordered are listed, but only abnormal results are displayed)  Labs Reviewed  CBC WITH DIFFERENTIAL/PLATELET - Abnormal; Notable for the following components:      Result Value   Lymphs Abs 4.8 (*)    All other components within normal limits  TROPONIN I  COMPREHENSIVE METABOLIC PANEL  TSH   ____________________________________________  EKG  EKG read and interpreted by me shows  A. fib with RVR at a rate of 193 normal axis rate related ST-T changes ____________________________________________  RADIOLOGY  ED MD interpretation: Chest x-ray read and interpreted by radiology and reviewed by me shows mild CHF.  Official radiology report(s): Dg Chest Port 1 View  Result Date: 03/14/2019 CLINICAL DATA:  Initial evaluation for acute chest pain. EXAM: PORTABLE CHEST 1 VIEW COMPARISON:  Prior radiograph from 12/19/2018. FINDINGS: Cardiac and mediastinal silhouettes are stable in size and contour, and remain within normal limits. Lungs normally inflated. Mild central perihilar vascular congestion without overt pulmonary edema. No focal infiltrates. No pleural effusion. No pneumothorax. No acute osseous finding. IMPRESSION: 1. Mild central perihilar vascular congestion without overt pulmonary edema. 2. No other active cardiopulmonary disease. Electronically Signed   By: Jeannine Boga M.D.   On: 03/14/2019 00:01    ____________________________________________   PROCEDURES  Procedure(s) performed (including Critical Care):  Critical care time half an hour mostly spent to the patient's bedside trying to get her to respond to vagal maneuvers and watching her blood pressure while the initial 2 doses of diltiazem were given.  Procedures   ____________________________________________   INITIAL IMPRESSION / ASSESSMENT AND PLAN / ED COURSE  Patient on diltiazem drip now still in a heart rate of 120.  A. fib.  She does have mild CHF now.  We will get her in the hospital and work on her heart rate.  Her troponin is negative currently.              ____________________________________________   FINAL CLINICAL IMPRESSION(S) / ED DIAGNOSES  Final diagnoses:  Atrial fibrillation with RVR (HCC)  Congestive heart failure, unspecified HF chronicity, unspecified heart failure type ([place])  Chest pain, unspecified type     ED Discharge Orders    None       Note:   This document was prepared using Dragon voice recognition software and may include unintentional dictation errors.    Nena Polio, MD 03/14/19 [phone]

## 2019-03-14 NOTE — ED Notes (Signed)
15mg  cardizem given IV per verb order.

## 2019-03-14 NOTE — ED Notes (Signed)
Called to report. Told to give ascom number and CCU RN will call this RN back. Ascom number given.

## 2019-03-15 ENCOUNTER — Telehealth: Payer: Self-pay

## 2019-03-15 NOTE — Telephone Encounter (Signed)
Transition Care Management Follow-up Telephone Call  Date of discharge and from where: 03/14/19 [facility]   How have you been since you were released from the hospital? Pt states she is doing okay but still feeling fatigued  Any questions or concerns? Yes - pt is concerned about not being able to go to office for cardiology appt and anxious to hear from them about whether or not she will need to wear a heart monitor.   Items Reviewed:  Did the pt receive and understand the discharge instructions provided? Yes   Medications obtained and verified? Yes   Any new allergies since your discharge? No   Dietary orders reviewed? Yes  Do you have support at home? Yes   Functional Questionnaire: (I = Independent and D = Dependent) ADLs: I  Bathing/Dressing- I  Meal Prep- I  Eating- I  Maintaining continence- I  Transferring/Ambulation- I  Managing Meds- I  Follow up appointments reviewed:   PCP Hospital f/u appt confirmed? No  Discharge instructions did not indicate hospital follow up with PCP  Specialist Hospital f/u appt confirmed? No  Patient awaiting appt with Dr. [name] for cardiology  Are transportation arrangements needed? No   If their condition worsens, is the pt aware to call PCP or go to the Emergency Dept.? Yes  Was the patient provided with contact information for the PCP's office or ED? Yes  Was to pt encouraged to call back with questions or concerns? Yes

## 2019-03-17 NOTE — Telephone Encounter (Signed)
Dr. Sanda Klein, please see below. Thank you!

## 2019-03-26 ENCOUNTER — Telehealth (INDEPENDENT_AMBULATORY_CARE_PROVIDER_SITE_OTHER): Payer: Managed Care, Other (non HMO) | Admitting: Cardiovascular Disease

## 2019-03-26 ENCOUNTER — Other Ambulatory Visit: Payer: Self-pay

## 2019-03-26 ENCOUNTER — Telehealth: Payer: Self-pay

## 2019-03-26 DIAGNOSIS — F418 Other specified anxiety disorders: Secondary | ICD-10-CM

## 2019-03-26 DIAGNOSIS — R0789 Other chest pain: Secondary | ICD-10-CM

## 2019-03-26 DIAGNOSIS — I471 Supraventricular tachycardia: Secondary | ICD-10-CM | POA: Diagnosis not present

## 2019-03-26 DIAGNOSIS — I10 Essential (primary) hypertension: Secondary | ICD-10-CM

## 2019-03-26 DIAGNOSIS — I4891 Unspecified atrial fibrillation: Secondary | ICD-10-CM | POA: Diagnosis not present

## 2019-03-26 NOTE — Progress Notes (Addendum)
Virtual Visit via Video Note   This visit type was conducted due to national recommendations for restrictions regarding the COVID-19 Pandemic (e.g. social distancing) in an effort to limit this patient's exposure and mitigate transmission in our community.  Due to her co-morbid illnesses, this patient is at least at moderate risk for complications without adequate follow up.  This format is felt to be most appropriate for this patient at this time.  All issues noted in this document were discussed and addressed.  A limited physical exam was performed with this format.  Please refer to the patient's chart for her consent to telehealth for [facility].    Date:  03/26/2019   ID:  Lori Oliver, DOB 26-Jun-1971, MRN [id number]  Patient Location:  [address] [place] [address]   Provider location:   [facility], [place] office  PCP:  Arnetha Courser, MD  Cardiologist:  Patsy Baltimore  Chief Complaint:  Palpitations, tachycardia    History of Present Illness:    Lori Oliver is a 48 y.o. female who presents via audio/video conferencing for a telehealth visit today.   The patient does not symptoms concerning for COVID-19 infection (fever, chills, cough, or new SHORTNESS OF BREATH).   Patient has a past medical history of Lori Oliver is a 48 year old woman with past medical history of anxiety Atrial fib in 12/01/2017 after having taken  Sudafed and cough medicine. HTN Morbid obesity  Who presents for follow-up of her paroxysmal atrial fibrillation, atypical chest pain  Recent hospitalization for atrial fibrillation sitting on her couch at rest watching television March 13, 2019  acute onset of tachycardia with some chest tightness shortness of breath.  She does have propranolol but did not think to take this Last episode of atrial fibrillation December 2018 after taking Sudafed and other cough medicine  Recently admitted to the hospital March 14, 2019 for chest pain, atrial fibrillation Hospital records reviewed with the patient in detail Presented to the emergency room with heart rates in the low 200 range Did have soda x2 Started on diltiazem infusion Was kept in the hospital for pulmonary edema, started on Lasix, diltiazem for rate control  Converted back to normal sinus rhythm on diltiazem chads vasc 2 (female and hypertension) She prefered not to be on anticoagulation  -Risk factors include obesity, possible sleep apnea, hypertension  LVH on echocardiogram possibly secondary to longstanding hypertension  In follow-up today she continues to have episodes of tachycardia running up to 120 bpm  she would like to wear a extended monitor for 2 weeks before committing to daily medications for rate and rhythm control   She does have diltiazem 30 mg pills and propranolol 20 mg pills that she can take as needed for breakthrough tachycardia  Previously reported that she is distressed about recent weight gain according to her numbers is up 20 pounds -On our discussion today she was on her way to [facility] to buy a piece of cake   lots of stress at home, husband with cancer and she is helping to take care of him She continues to work  Discussed prior sleep study suggesting mild sleep apnea at that time over year ago, she declined CPAP  Other past medical history reviewed Recent emergency room visits as detailed below ER March 27, 2018 for chest pain atypical ER 02/28/2018 for fall, knee pain ER 02/11/2018 for arm pain ER 02/08/2018 for palpitations and shortness of breath. ER 2/8/209 with  HTN ER 01/02/2018 for nausea ER 12/23/2017 for chest pain, atypical ER 12/21/2017 for SOB, was tearful ER 12/06/2017 for palplitations, , [facility] 12/01/2017 for atrial fib with RVR, seen by dr. Humphrey Rolls Heart rate was 190 bpm, developed after taking cold medication Started on sotolol, Noac As an outpatient had fatigue, did not feel comfortable  taking anticoagulation   Prior CV studies:   The following studies were reviewed today:  She has had prior outpatient stress testing over a year ago  CT scan chest reviewed  from 2018 showing no coronary calcification or aortic atherosclerosis  Sleep test  mild sleep apnea She declined CPAP Stress test through [facility]  Echocardiogram March 14, 2019 1. The left ventricle has normal systolic function with an ejection fraction of 60-65%. The cavity size was normal. There is mild to moderately increased left ventricular wall thickness. Left ventricular diastolic Doppler parameters are indeterminate. 2. The right ventricle has normal systolic function. The cavity was normal. There is no increase in right ventricular wall thickness. 3. Rhythm is atrial fibrillation  Past Medical History:  Diagnosis Date  . Abnormal thyroid function test 05/16/2016  . Anxiety   . Atrial fibrillation ([place])   . 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 Decreased libido 05/16/2016  . Dysrhythmia   . Hypertension   . Hypokalemia   . Impaired fasting glucose   . Morbid obesity ([place]) 06/01/2015  . Morbid obesity ([place])   . Plantar fasciitis    Past Surgical History:  Procedure Laterality Date  . BREAST BIOPSY Right 2015   benign  . ESOPHAGOGASTRODUODENOSCOPY (EGD) WITH PROPOFOL N/A 08/12/2018   Procedure: ESOPHAGOGASTRODUODENOSCOPY (EGD) WITH PROPOFOL;  Surgeon: Toledo, Benay Pike, MD;  Location: [facility] ENDOSCOPY;  Service: Gastroenterology;  Laterality: N/A;  . HAMMER TOE SURGERY       No outpatient medications have been marked as taking for the 03/26/19 encounter (Appointment) with Minna Merritts, MD.     Allergies:   Patient has no known allergies.   Social History   Tobacco Use  . Smoking status: Never Smoker  . Smokeless tobacco: Never Used  Substance Use Topics  . Alcohol use: Yes    Alcohol/week: 0.0 standard drinks    Frequency: Never    Comment: occ  . Drug use: No     Current  Outpatient Medications on File Prior to Visit  Medication Sig Dispense Refill  . Crisaborole (EUCRISA) 2 % OINT Apply 1 application topically daily. (Patient not taking: Reported on 03/14/2019) 1 Tube 3  . diltiazem (CARDIZEM) 30 MG tablet Take 1 tablet (30 mg total) by mouth 3 (three) times daily as needed (for HR > 120). 30 tablet 0  . hydrochlorothiazide (MICROZIDE) 12.5 MG capsule Take 1 capsule (12.5 mg total) by mouth daily. 90 capsule 0  . pantoprazole (PROTONIX) 40 MG tablet Take 40 mg by mouth daily.     No current facility-administered medications on file prior to visit.      Family Hx: The patient's family history includes Arthritis in her mother; Depression in her mother; Heart disease in her mother; Hypertension in her mother; Stroke in her maternal grandmother.  ROS:   Please see the history of present illness.    Review of Systems  Constitutional: Negative.   Respiratory: Negative.   Cardiovascular: Positive for palpitations.       Tachycardia  Gastrointestinal: Negative.   Musculoskeletal: Negative.   Neurological: Negative.   Psychiatric/Behavioral: Negative.   All other systems reviewed and are  negative.    Labs/Other Tests and Data Reviewed:    Recent Labs: 03/13/2019: ALT 25 03/14/2019: BUN 14; Creatinine, Ser 0.82; Hemoglobin 12.8; Magnesium 2.0; Platelets 292; Potassium 3.8; Sodium 137; TSH 3.709   Recent Lipid Panel Lab Results  Component Value Date/Time   CHOL 162 12/14/2018 08:38 AM   CHOL 177 07/07/2017 03:43 PM   CHOL 165 03/09/2014 05:29 PM   TRIG 69 12/14/2018 08:38 AM   TRIG 96 03/09/2014 05:29 PM   HDL 46 (L) 12/14/2018 08:38 AM   HDL 49 07/07/2017 03:43 PM   HDL 60 03/09/2014 05:29 PM   CHOLHDL 3.5 12/14/2018 08:38 AM   LDLCALC 100 (H) 12/14/2018 08:38 AM   LDLCALC 86 03/09/2014 05:29 PM    Wt Readings from Last 3 Encounters:  03/14/19 271 lb 8 oz (123.2 kg)  02/22/19 274 lb 4.8 oz (124.4 kg)  01/04/19 268 lb 8 oz (121.8 kg)      Exam:    Vital Signs: Vital signs may also be detailed in the HPI LMP 06/20/2016    Temp Readings from Last 3 Encounters:  03/14/19 98.2 F (36.8 C) (Oral)  02/22/19 97.8 F (36.6 C)  01/04/19 98.4 F (36.9 C) (Oral)   BP Readings from Last 3 Encounters:  03/14/19 112/72  02/22/19 116/78  01/04/19 126/88   Pulse Readings from Last 3 Encounters:  03/14/19 78  02/22/19 95  01/04/19 85    Blood pressure 115 over 70s Heart rate 90 Respiration 16  Well nourished, well developed female in no acute distress. Constitutional:  oriented to person, place, and time. No distress.  Head: Normocephalic and atraumatic.  Eyes:  no discharge. No scleral icterus.  Neck: Normal range of motion. Neck supple.  Pulmonary/Chest: No audible wheezing, no distress, appears comfortable Musculoskeletal: Normal range of motion.  no  tenderness or deformity.  Neurological:   Coordination normal. Full exam not performed Skin:  No rash Psychiatric:  normal mood and affect. behavior is normal. Thought content normal.    ASSESSMENT & PLAN:    Paroxysmal atrial fibrillation -Recent hospitalization reviewed with her She continues to have paroxysmal tachycardia, less likely atrial fibrillation, possibly atrial tachycardia We will order ZIO monitor for rhythms as detailed above --At the completion of her monitor, we could hold the HCTZ and start alternate medication such as diltiazem extended release 120 daily, other option would be metoprolol succinate -For now recommended she take diltiazem 30 mg pills as needed She also has propranolol  Essential hypertension Recommend she monitor blood pressure at home Need to have strict control of her blood pressure given LVH on echocardiogram  Chest pain Chest tightness secondary to atrial fibrillation with RVR heart rates in the 200 range in the emergency room No further ischemic work-up at this time     COVID-19 Education: The signs and symptoms of  COVID-19 were discussed with the patient and how to seek care for testing (follow up with PCP or arrange E-visit).  The importance of social distancing was discussed today.  Patient Risk:   After full review of this patients clinical status, I feel that they are at least moderate risk at this time.  Time:   Today, I have spent 25 minutes with the patient with telehealth technology discussing the cardiac and medical problems/diagnoses detailed above  Discussed hospital follow-up, directions Discussed paroxysmal tachycardia, atrial tachycardia, paroxysmal atrial fibrillation, treatments, medications that can be used Order to monitor, discussed how to apply the monitor and when results will be  available  Medication Adjustments/Labs and Tests Ordered: Current medicines are reviewed at length with the patient today.  Concerns regarding medicines are outlined above.   Tests Ordered: No tests ordered   Medication Changes: No changes made   Disposition: Follow-up in 3 months   Signed, Ida Rogue, MD  03/26/2019 3:07 PM    [place] Office [address] [place] [address], [place], [place] [address]

## 2019-03-26 NOTE — Telephone Encounter (Signed)
Spoke with patient.  Reviewed the instructions on how todays visit will go.  Offered a Doxy trial for that but she was at work and was unable to do it.  Verified that consent has been read to her.  Patient stated that consent was read to her and she did agree.  Will document consent below.     Virtual Visit Pre-Appointment Phone Call  Steps For Call:  1. Confirm consent - "In the setting of the current Covid19 crisis, you are scheduled for a VIDEO visit with your provider on 03/26/2019 at 3:20PM.  Just as we do with many in-office visits, in order for you to participate in this visit, we must obtain consent.  If you'd like, I can send this to your mychart (if signed up) or email for you to review.  Otherwise, I can obtain your verbal consent now.  All virtual visits are billed to your insurance company just like a normal visit would be.  By agreeing to a virtual visit, we'd like you to understand that the technology does not allow for your provider to perform an examination, and thus may limit your provider's ability to fully assess your condition.  Finally, though the technology is pretty good, we cannot assure that it will always work on either your or our end, and in the setting of a video visit, we may have to convert it to a phone-only visit.  In either situation, we cannot ensure that we have a secure connection.  Are you willing to proceed?"  2. Give patient instructions for WebEx download to smartphone as below if video visit  3. Advise patient to be prepared with any vital sign or heart rhythm information, their current medicines, and a piece of paper and pen handy for any instructions they may receive the day of their visit  4. Inform patient they will receive a phone call 15 minutes prior to their appointment time (may be from unknown caller ID) so they should be prepared to answer  5. Confirm that appointment type is correct in Epic appointment notes (video vs telephone)     TELEPHONE CALL NOTE  Lori Oliver has been deemed a candidate for a follow-up tele-health visit to limit community exposure during the Covid-19 pandemic. I spoke with the patient via phone to ensure availability of phone/video source, confirm preferred email & phone number, and discuss instructions and expectations.  I reminded Lori Oliver to be prepared with any vital sign and/or heart rhythm information that could potentially be obtained via home monitoring, at the time of her visit. I reminded Lori Oliver to expect a phone call at the time of her visit if her visit.  Did the patient verbally acknowledge consent to treatment? yes  Janan Ridge, [place] 03/26/2019 10:28 AM  CONSENT FOR TELE-HEALTH VISIT - PLEASE REVIEW  I hereby voluntarily request, consent and authorize [facility] and its employed or contracted physicians, physician assistants, nurse practitioners or other licensed health care professionals (the Practitioner), to provide me with telemedicine health care services (the "Services") as deemed necessary by the treating Practitioner. I acknowledge and consent to receive the Services by the Practitioner via telemedicine. I understand that the telemedicine visit will involve communicating with the Practitioner through live audiovisual communication technology and the disclosure of certain medical information by electronic transmission. I acknowledge that I have been given the opportunity to request an in-person assessment or other available alternative prior to the telemedicine visit and am voluntarily participating  in the telemedicine visit.  I understand that I have the right to withhold or withdraw my consent to the use of telemedicine in the course of my care at any time, without affecting my right to future care or treatment, and that the Practitioner or I may terminate the telemedicine visit at any time. I understand that I have the right to inspect all information  obtained and/or recorded in the course of the telemedicine visit and may receive copies of available information for a reasonable fee.  I understand that some of the potential risks of receiving the Services via telemedicine include:  Marland Kitchen Delay or interruption in medical evaluation due to technological equipment failure or disruption; . Information transmitted may not be sufficient (e.g. poor resolution of images) to allow for appropriate medical decision making by the Practitioner; and/or  . In rare instances, security protocols could fail, causing a breach of personal health information.  Furthermore, I acknowledge that it is my responsibility to provide information about my medical history, conditions and care that is complete and accurate to the best of my ability. I acknowledge that Practitioner's advice, recommendations, and/or decision may be based on factors not within their control, such as incomplete or inaccurate data provided by me or distortions of diagnostic images or specimens that may result from electronic transmissions. I understand that the practice of medicine is not an exact science and that Practitioner makes no warranties or guarantees regarding treatment outcomes. I acknowledge that I will receive a copy of this consent concurrently upon execution via email to the email address I last provided but may also request a printed copy by calling the office of [facility].    I understand that my insurance will be billed for this visit.   I have read or had this consent read to me. . I understand the contents of this consent, which adequately explains the benefits and risks of the Services being provided via telemedicine.  . I have been provided ample opportunity to ask questions regarding this consent and the Services and have had my questions answered to my satisfaction. . I give my informed consent for the services to be provided through the use of telemedicine in my medical care   By participating in this telemedicine visit I agree to the above.

## 2019-03-26 NOTE — Patient Instructions (Signed)
We will send a Zio monitor For atrial fib, paroxysmal atrial tachycardia   Medication Instructions:  No changes  If you need a refill on your cardiac medications before your next appointment, please call your pharmacy.    Lab work: No new labs needed   If you have labs (blood work) drawn today and your tests are completely normal, you will receive your results only by: Marland Kitchen MyChart Message (if you have MyChart) OR . A paper copy in the mail If you have any lab test that is abnormal or we need to change your treatment, we will call you to review the results.   Testing/Procedures: No new testing needed   Follow-Up: At [facility], you and your health needs are our priority.  As part of our continuing mission to provide you with exceptional heart care, we have created designated Provider Care Teams.  These Care Teams include your primary Cardiologist (physician) and Advanced Practice Providers (APPs -  Physician Assistants and Nurse Practitioners) who all work together to provide you with the care you need, when you need it.  . You will need a follow up appointment in 3 months .   Please call our office 2 months in advance to schedule this appointment.    . Providers on your designated Care Team:   . Murray Hodgkins, NP . Christell Faith, PA-C . Marrianne Mood, PA-C  Any Other Special Instructions Will Be Listed Below (If Applicable).  For educational health videos Log in to : [email or web address] Or : [email or web address], password : [email or web address]

## 2019-04-05 ENCOUNTER — Telehealth: Payer: Self-pay | Admitting: Cardiovascular Disease

## 2019-04-05 ENCOUNTER — Ambulatory Visit (INDEPENDENT_AMBULATORY_CARE_PROVIDER_SITE_OTHER): Payer: 59

## 2019-04-05 DIAGNOSIS — I4891 Unspecified atrial fibrillation: Secondary | ICD-10-CM | POA: Diagnosis not present

## 2019-04-05 NOTE — Telephone Encounter (Signed)
Patient needs help with zio application .  TX to [employer]

## 2019-04-05 NOTE — Telephone Encounter (Signed)
I spoke with the patient. She was unsure how to apply her ZIO XT monitor. I walked the patient through application of the monitor. She was able to apply this and successfully activate it.

## 2019-04-12 ENCOUNTER — Telehealth: Payer: Self-pay | Admitting: Cardiovascular Disease

## 2019-04-12 NOTE — Telephone Encounter (Signed)
Call placed to the patient. She stated that the zio monitor has started to come off. She has worn it for seven days now. She has tried using a skin prep and taping but it still keeps coming off. She would like to know if she should keep trying or mail it back.  She stated that when she it as home it stays on but when she goes into work that it falls off. She feels like her palpitations and possible afib could be from stress at work because that is the time when she feels the palpitations. She is not aware of them while at home.

## 2019-04-12 NOTE — Telephone Encounter (Signed)
Patient calling Patient has worn a heart monitor for about 7 days Patient states it keeps coming off and would like to know what to do, if she should just leave it off  Please call to discuss

## 2019-04-13 NOTE — Telephone Encounter (Signed)
Spoke with patient and reviewed provider recommendations. She prefers to wait for first monitor to see if that shows enough information on it before wearing another one. Let her know that we would call with results and see if provider wants another one. She verbalized understanding with no further questions at this time.

## 2019-04-13 NOTE — Telephone Encounter (Signed)
She could mail back Perhaps we can get her a new one for 1 more week for total of 2 weeks

## 2019-04-16 DIAGNOSIS — Z1159 Encounter for screening for other viral diseases: Secondary | ICD-10-CM | POA: Diagnosis not present

## 2019-04-19 DIAGNOSIS — I480 Paroxysmal atrial fibrillation: Secondary | ICD-10-CM | POA: Diagnosis not present

## 2019-04-20 ENCOUNTER — Other Ambulatory Visit: Payer: Self-pay

## 2019-04-29 ENCOUNTER — Telehealth: Payer: Self-pay | Admitting: Cardiovascular Disease

## 2019-04-29 NOTE — Telephone Encounter (Signed)
°  1. Is this related to a heart monitor you are wearing?  (If the patient says no, please ask     if they are caling about ICD/pacemaker.) No   2. What is your issue?? Results of zio  (If the patient is calling for results of the heart monitor this     message should be sent to nurse.)   Please route to covering RN/CMA/RMA for results. Route to monitor technicians or your monitor tech representative for your site for any technical concerns

## 2019-04-29 NOTE — Telephone Encounter (Signed)
Spoke with patient and she was calling in to check on her monitor results. I see that it was uploaded but not read by provider. Not sure of how this happened but I did review a preliminary finding with her and advised that I would send to Dr. [name] for his review and would certainly call her back with results and recommendations. She was very understanding and I assured her that I would follow up on this and be in touch. She verbalized understanding of our conversation with no further questions.

## 2019-04-30 ENCOUNTER — Emergency Department: Payer: 59

## 2019-04-30 ENCOUNTER — Emergency Department
Admission: EM | Admit: 2019-04-30 | Discharge: 2019-04-30 | Disposition: A | Discharge status: Home / Self Care | Payer: 59 | Attending: Emergency Medicine | Admitting: Emergency Medicine

## 2019-04-30 ENCOUNTER — Other Ambulatory Visit: Payer: Self-pay

## 2019-04-30 ENCOUNTER — Encounter: Payer: Self-pay | Admitting: Emergency Medicine

## 2019-04-30 DIAGNOSIS — R1110 Vomiting, unspecified: Secondary | ICD-10-CM | POA: Diagnosis not present

## 2019-04-30 DIAGNOSIS — Z79899 Other long term (current) drug therapy: Secondary | ICD-10-CM | POA: Insufficient documentation

## 2019-04-30 DIAGNOSIS — I10 Essential (primary) hypertension: Secondary | ICD-10-CM | POA: Insufficient documentation

## 2019-04-30 DIAGNOSIS — K2100 Gastro-esophageal reflux disease with esophagitis, without bleeding: Secondary | ICD-10-CM

## 2019-04-30 DIAGNOSIS — R1011 Right upper quadrant pain: Secondary | ICD-10-CM

## 2019-04-30 LAB — COMPREHENSIVE METABOLIC PANEL WITH GFR
ALT: 14 U/L (ref 0–44)
AST: 14 U/L — ABNORMAL LOW (ref 15–41)
Albumin: 3.6 g/dL (ref 3.5–5.0)
Alkaline Phosphatase: 68 U/L (ref 38–126)
Anion gap: 8 (ref 5–15)
BUN: 12 mg/dL (ref 6–20)
CO2: 25 mmol/L (ref 22–32)
Calcium: 8.4 mg/dL — ABNORMAL LOW (ref 8.9–10.3)
Chloride: 102 mmol/L (ref 98–111)
Creatinine, Ser: 0.86 mg/dL (ref 0.44–1.00)
GFR calc Af Amer: >60 mL/min
GFR calc non Af Amer: >60 mL/min
Glucose, Bld: 129 mg/dL — ABNORMAL HIGH (ref 70–99)
Potassium: 3.9 mmol/L (ref 3.5–5.1)
Sodium: 135 mmol/L (ref 135–145)
Total Bilirubin: 0.3 mg/dL (ref 0.3–1.2)
Total Protein: 7.4 g/dL (ref 6.5–8.1)

## 2019-04-30 LAB — CBC WITH DIFFERENTIAL/PLATELET
Abs Immature Granulocytes: 0.02 10*3/uL (ref 0.00–0.07)
Basophils Absolute: 0 10*3/uL (ref 0.0–0.1)
Basophils Relative: 0 %
Eosinophils Absolute: 0.2 10*3/uL (ref 0.0–0.5)
Eosinophils Relative: 3 %
HCT: 37.6 % (ref 36.0–46.0)
Hemoglobin: 12.3 g/dL (ref 12.0–15.0)
Immature Granulocytes: 0 %
Lymphocytes Relative: 35 %
Lymphs Abs: 2.5 10*3/uL (ref 0.7–4.0)
MCH: 31.1 pg (ref 26.0–34.0)
MCHC: 32.7 g/dL (ref 30.0–36.0)
MCV: 94.9 fL (ref 80.0–100.0)
Monocytes Absolute: 0.5 10*3/uL (ref 0.1–1.0)
Monocytes Relative: 7 %
Neutro Abs: 3.9 10*3/uL (ref 1.7–7.7)
Neutrophils Relative %: 55 %
Platelets: 273 10*3/uL (ref 150–400)
RBC: 3.96 MIL/uL (ref 3.87–5.11)
RDW: 12.5 % (ref 11.5–15.5)
WBC: 7.3 10*3/uL (ref 4.0–10.5)
nRBC: 0 % (ref 0.0–0.2)

## 2019-04-30 LAB — LIPASE, BLOOD: Lipase: 28 U/L (ref 11–51)

## 2019-04-30 MED ORDER — LIDOCAINE VISCOUS HCL 2 % MT SOLN
15.0000 mL | Freq: Once | OROMUCOSAL | Status: AC
  Administered 2019-04-30: 15 mL via ORAL

## 2019-04-30 MED ORDER — ONDANSETRON 4 MG PO TBDP
ORAL_TABLET | ORAL | Status: AC
  Administered 2019-04-30: 4 mg via ORAL
  Filled 2019-04-30: qty 1

## 2019-04-30 MED ORDER — PANTOPRAZOLE SODIUM 40 MG PO TBEC: 40.0000 mg | DELAYED_RELEASE_TABLET | Freq: Two times a day (BID) | ORAL | 0 refills | Status: DC

## 2019-04-30 MED ORDER — ALUM & MAG HYDROXIDE-SIMETH 200-200-20 MG/5ML PO SUSP
30.0000 mL | Freq: Once | ORAL | Status: AC
  Administered 2019-04-30: 30 mL via ORAL

## 2019-04-30 MED ORDER — ONDANSETRON 4 MG PO TBDP
4.0000 mg | ORAL_TABLET | Freq: Once | ORAL | Status: AC
  Administered 2019-04-30: 04:00:00 4 mg via ORAL

## 2019-04-30 MED ORDER — PANTOPRAZOLE SODIUM 40 MG IV SOLR
40.0000 mg | Freq: Once | INTRAVENOUS | Status: AC
  Administered 2019-04-30: 40 mg via INTRAVENOUS
  Filled 2019-04-30: qty 40

## 2019-04-30 NOTE — ED Provider Notes (Signed)
[facility] Emergency Department Provider Note   None    (approximate)  I have reviewed the triage vital signs and the nursing notes.   HISTORY  Chief Complaint Emesis    HPI Lori Oliver is a 48 y.o. female with below list of previous medical conditions including GERD for which the patient takes Protonix presents to the emergency department with burning midline chest and neck pain following vomiting.   Patient denies any abdominal pain no diarrhea.  Patient denies any fever.        Past Medical History:  Diagnosis Date  . Abnormal thyroid function test 05/16/2016  . Anxiety   . Atrial fibrillation ([place])   . 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 Decreased libido 05/16/2016  . Dysrhythmia   . Hypertension   . Hypokalemia   . Impaired fasting glucose   . Morbid obesity ([place]) 06/01/2015  . Morbid obesity ([place])   . Plantar fasciitis     Patient Active Problem List   Diagnosis Date Noted  . Atrial fibrillation with rapid ventricular response ([place]) 03/14/2019  . Intermittent atrial fibrillation ([place]) 02/19/2018  . Hypokalemia 02/10/2018  . Anxiety about health 01/13/2018  . Atrial fibrillation with RVR ([place]) 12/01/2017  . Vitamin D deficiency 07/08/2017  . Lower back pain 07/07/2017  . Screen for colon cancer 07/07/2017  . Morbid obesity ([place]) 07/07/2017  . Essential hypertension 07/11/2016  . Preventative health care 05/16/2016  . IFG (impaired fasting glucose) 05/16/2016  . 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 Abnormal thyroid function test 05/16/2016  . Decreased libido 05/16/2016  . Breast cancer screening 05/16/2016  . Breast lump on left side at 5 o'clock position 05/16/2016  . Contraception management 05/16/2016  . Surveillance for birth control, oral contraceptives 02/12/2016  . Bilateral thoracic back pain 06/01/2015  . History of breast lump 06/01/2015  . History of abnormal cervical Pap smear 06/01/2015    Past  Surgical History:  Procedure Laterality Date  . BREAST BIOPSY Right 2015   benign  . ESOPHAGOGASTRODUODENOSCOPY (EGD) WITH PROPOFOL N/A 08/12/2018   Procedure: ESOPHAGOGASTRODUODENOSCOPY (EGD) WITH PROPOFOL;  Surgeon: Toledo, Benay Pike, MD;  Location: [facility] ENDOSCOPY;  Service: Gastroenterology;  Laterality: N/A;  . HAMMER TOE SURGERY      Prior to Admission medications   Medication Sig Start Date End Date Taking? Authorizing Provider  [place]ris[place] (EUCRISA) 2 % OINT Apply 1 application topically daily. 02/22/19   Poulose, Bethel Born, NP  diltiazem (CARDIZEM) 30 MG tablet Take 1 tablet (30 mg total) by mouth 3 (three) times daily as needed (for HR > 120). 03/14/19   Vaughan Basta, MD  hydrochlorothiazide (MICROZIDE) 12.5 MG capsule Take 1 capsule (12.5 mg total) by mouth daily. 01/04/19   Poulose, Bethel Born, NP  pantoprazole (PROTONIX) 40 MG tablet Take 40 mg by mouth daily.    [provider]  pantoprazole (PROTONIX) 40 MG tablet Take 1 tablet (40 mg total) by mouth 2 (two) times daily for 30 days. 04/30/19 05/30/19  Gregor Hams, MD    Allergies Patient has no known allergies.  Family History  Problem Relation Age of Onset  . Hypertension Mother   . Heart disease Mother   . Arthritis Mother   . Depression Mother   . Stroke Maternal Grandmother     Social History Social History   Tobacco Use  . Smoking status: Never Smoker  . Smokeless tobacco: Never Used  Substance Use Topics  . Alcohol use: Yes  Alcohol/week: 0.0 standard drinks    Frequency: Never    Comment: occ  . Drug use: No    Review of Systems Constitutional: No fever/chills Eyes: No visual changes. ENT: No sore throat. Cardiovascular: Denies chest pain.  Burning chest discomfort. Respiratory: Denies shortness of breath. Gastrointestinal: No abdominal pain.  No nausea, no vomiting.  No diarrhea.  No constipation. Genitourinary: Negative for dysuria. Musculoskeletal: Negative for neck  pain.  Negative for back pain. Integumentary: Negative for rash. Neurological: Negative for headaches, focal weakness or numbness.   ____________________________________________   PHYSICAL EXAM:  VITAL SIGNS: ED Triage Vitals  Enc Vitals Group     BP 04/30/19 0312 (!) 137/92     Pulse Rate 04/30/19 0312 82     Resp 04/30/19 0312 18     Temp 04/30/19 0312 98.2 F (36.8 C)     Temp Source 04/30/19 0312 Oral     SpO2 04/30/19 0312 100 %     Weight 04/30/19 0307 120.2 kg (265 lb)     Height 04/30/19 0307 1.676 m (5\' 6" )     Head Circumference --      Peak Flow --      Pain Score 04/30/19 0310 0     Pain Loc --      Pain Edu? --      Excl. in [place]? --     Constitutional: Alert and oriented. Well appearing and in no acute distress. Eyes: Conjunctivae are normal.  Mouth/Throat: Mucous membranes are moist.  Oropharynx non-erythematous. Neck: No stridor.  Cardiovascular: Normal rate, regular rhythm. Good peripheral circulation. Grossly normal heart sounds. Respiratory: Normal respiratory effort.  No retractions. No audible wheezing. Gastrointestinal: Right upper quadrant tenderness to palpation . No distention.  Musculoskeletal: No lower extremity tenderness nor edema. No gross deformities of extremities. Neurologic:  Normal speech and language. No gross focal neurologic deficits are appreciated.  Skin:  Skin is warm, dry and intact. No rash noted. Psychiatric: Mood and affect are normal. Speech and behavior are normal.  ____________________________________________   LABS (all labs ordered are listed, but only abnormal results are displayed)  Labs Reviewed  COMPREHENSIVE METABOLIC PANEL - Abnormal; Notable for the following components:      Result Value   Glucose, Bld 129 (*)    Calcium 8.4 (*)    AST 14 (*)    All other components within normal limits  CBC WITH DIFFERENTIAL/PLATELET  LIPASE, BLOOD   ____________________________________________  EKG    RADIOLOGY  I, Frankston N BROWN, personally viewed and evaluated these images (plain radiographs) as part of my medical decision making, as well as reviewing the written report by the radiologist.  ED MD interpretation: Negative right upper quadrant ultrasound  Official radiology report(s): US Abdomen Limited Ruq  Result Date: 04/30/2019 CLINICAL DATA:  48 year old female with right upper quadrant pain and vomiting this week. EXAM: ULTRASOUND ABDOMEN LIMITED RIGHT UPPER QUADRANT COMPARISON:  Chest CTA 02/01/2017. FINDINGS: Gallbladder: No gallstones or wall thickening visualized. No sonographic Murphy sign noted by sonographer. Common bile duct: Diameter: 3 millimeters, normal. Liver: No focal lesion identified. Parenchymal echogenicity at the upper limits of normal. No intrahepatic ductal dilatation. Portal vein is patent on color Doppler imaging with normal direction of blood flow towards the liver. Other findings: Negative visible right kidney. IMPRESSION: Negative right upper quadrant ultrasound. Electronically Signed   By: Genevie Ann M.D.   On: 04/30/2019 05:17      Procedures   ____________________________________________   INITIAL IMPRESSION /  MDM / ASSESSMENT AND PLAN / ED COURSE  As part of my medical decision making, I reviewed the following data within the electronic MEDICAL RECORD NUMBER   48 year old female presented with above-stated history and physical exam secondary to midline burning chest discomfort.  Symptoms consistent with known history of reflux with esophagitis.  However considered a possibility of gallstones given the patient's right upper quadrant discomfort as an additional prove predicating factor.  As such right upper quadrant ultrasound performed which was negative.  Patient given a GI cocktail and 40 mg of IV Protonix with complete resolution of symptoms.  Patient will be prescribed Protonix 40 mg twice daily as her symptoms are not controlled with once per day Protonix.    ____________________________________________  FINAL CLINICAL IMPRESSION(S) / ED DIAGNOSES  Final diagnoses:  RUQ pain  Gastroesophageal reflux disease with esophagitis     MEDICATIONS GIVEN DURING THIS VISIT:  Medications  alum & mag hydroxide-simeth (MAALOX/MYLANTA) 200-200-20 MG/5ML suspension 30 mL (30 mLs Oral Given 04/30/19 0333)    And  lidocaine (XYLOCAINE) 2 % viscous mouth solution 15 mL (15 mLs Oral Given 04/30/19 0333)  ondansetron (ZOFRAN-ODT) disintegrating tablet 4 mg (4 mg Oral Given 04/30/19 0333)  pantoprazole (PROTONIX) injection 40 mg (40 mg Intravenous Given 04/30/19 0444)     ED Discharge Orders         Ordered    pantoprazole (PROTONIX) 40 MG tablet  2 times daily     04/30/19 0507           Note:  This document was prepared using Dragon voice recognition software and may include unintentional dictation errors.   Gregor Hams, MD 04/30/19 [phone]

## 2019-04-30 NOTE — ED Notes (Signed)
Patient transported to Ultrasound 

## 2019-04-30 NOTE — ED Triage Notes (Signed)
Patient ambulatory to triage with steady gait, without difficulty or distress noted; pt reports intermittent vomiting this week; increased vomiting tonight with blood noted in emesis; st taking protonix for reflux

## 2019-05-03 ENCOUNTER — Ambulatory Visit: Payer: Managed Care, Other (non HMO) | Admitting: Nurse Practitioner

## 2019-05-03 DIAGNOSIS — K219 Gastro-esophageal reflux disease without esophagitis: Secondary | ICD-10-CM | POA: Diagnosis not present

## 2019-05-03 DIAGNOSIS — K210 Gastro-esophageal reflux disease with esophagitis: Secondary | ICD-10-CM | POA: Diagnosis not present

## 2019-05-03 DIAGNOSIS — I480 Paroxysmal atrial fibrillation: Secondary | ICD-10-CM | POA: Diagnosis not present

## 2019-05-03 DIAGNOSIS — K209 Esophagitis, unspecified: Secondary | ICD-10-CM | POA: Diagnosis not present

## 2019-05-05 ENCOUNTER — Telehealth: Payer: Self-pay | Admitting: *Deleted

## 2019-05-05 MED ORDER — METOPROLOL SUCCINATE ER 25 MG PO TB24: 25.0000 mg | ORAL_TABLET | Freq: Every day | ORAL | 3 refills | Status: DC

## 2019-05-05 NOTE — Telephone Encounter (Signed)
Spoke with patient and reviewed results and recommendations. She mentioned that she tried this medication previously and that it made her feel very tired. Reviewed that this is common and option to try would be take it in the evening and hopefully that will reduce her symptoms during the day at work which was her concern. Also reviewed that she does have follow up in 3 months and we can see how things are going at that time. Instructed her to stop her hydrochlorothiazide and start metoprolol succinate 25 mg once daily. She verbalized understanding of our conversation, agreement with plan, and had no further questions at this time.

## 2019-05-05 NOTE — Telephone Encounter (Signed)
Patient wants to talk to Lori Oliver again

## 2019-05-05 NOTE — Telephone Encounter (Signed)
-----   Message from Minna Merritts, MD sent at 05/02/2019 12:40 PM EDT ----- Event monitor No atrial fib Patient triggered events were not associated with significant arrhythmia Rare short runs of atrial tachycardia would hold the HCTZ Start metoprolol succinate 25 mg daily

## 2019-05-06 NOTE — Telephone Encounter (Signed)
Other options for medication include diltiazem 30 mg 3 times daily PRN If blood pressure would tolerate by holding HCTZ she could take diltiazem extended release 120 mg daily This may not have the fatigue of a beta-blocker There is also bystolic 5 to 10 mg which is beta-blocker with less fatigue once a day

## 2019-05-06 NOTE — Addendum Note (Signed)
Addended by: Valora Corporal on: 05/06/2019 04:44 PM   Modules accepted: Orders

## 2019-05-06 NOTE — Telephone Encounter (Addendum)
Patient states that she was on this medication before and it did not make her feel well. She had fatigue and was hardley able to function at work. They weaned her off and then she came to our office for second opinion. She states that last year she was on propranolol to take as needed and she didn't need it often but felt it worked better and really doesn't want to be on something regular. So removed metoprolol from her list because she reports that she will not take it. Reviewed with her that Dr. [name] will certainly work with her to determine which medication works best. Scheduled her for a video visit with provider on June 1st so that she can review all of these concerns with provider. She verbalized understanding of our conversation, agreement with plan, and had no further questions at this time.

## 2019-05-16 NOTE — Progress Notes (Signed)
Virtual Visit via Video Note   This visit type was conducted due to national recommendations for restrictions regarding the COVID-19 Pandemic (e.g. social distancing) in an effort to limit this patient's exposure and mitigate transmission in our community.  Due to her co-morbid illnesses, this patient is at least at moderate risk for complications without adequate follow up.  This format is felt to be most appropriate for this patient at this time.  All issues noted in this document were discussed and addressed.  A limited physical exam was performed with this format.  Please refer to the patient's chart for her consent to telehealth for [facility].    Date:  05/17/2019   ID:  Lori Oliver, DOB [birth year], MRN [id number]  Patient Location:  [address] [place] [address]   Provider location:   [facility], [place] office  PCP:  Hubbard Hartshorn, FNP  Cardiologist:  Patsy Baltimore  Chief Complaint:  Palpitations, tachycardia   History of Present Illness:    Lori Oliver is a 48 y.o. female who presents via audio/video conferencing for a telehealth visit today.   The patient does not symptoms concerning for COVID-19 infection (fever, chills, cough, or new SHORTNESS OF BREATH).   Patient has a past medical history of Lori Oliver is a 48 year old woman with past medical history of anxiety Atrial fib in 12/01/2017 after having taken  Sudafed and cough medicine. HTN Morbid obesity  Who presents for follow-up of her paroxysmal atrial fibrillation, atypical chest pain  In follow-up today she denies having any further tachycardia or palpitations We reviewed recent event monitor with her in detail Very rare episodes of atrial tachycardia, asymptomatic We did recommend she take metoprolol succinate 25 mg daily She likes to take medications on as-needed basis only  Reports having lots of stress at home, husband with cancer, multiple children at home home  schooling and trying to work  Continues to be bothered by her weight gain  March 13, 2019 hospitalization for atrial fibrillation sitting on her couch at rest watching television  acute onset of tachycardia with some chest tightness shortness of breath.  She does have propranolol but did not think to take this Last episode of atrial fibrillation December 2018 after taking Sudafed and other cough medicine  admitted to the hospital March 14, 2019 for chest pain, atrial fibrillation Presented to the emergency room with heart rates in the low 200 range Did have soda x2 Started on diltiazem infusion Was kept in the hospital for pulmonary edema, started on Lasix, diltiazem for rate control  Converted back to normal sinus rhythm on diltiazem chads vasc 2 (female and hypertension) She prefered not to be on anticoagulation  -Risk factors include obesity, possible sleep apnea, hypertension  LVH on echocardiogram possibly secondary to longstanding hypertension  lots of stress at home, husband with cancer and she is helping to take care of him She continues to work  Discussed prior sleep study suggesting mild sleep apnea at that time over year ago, she declined CPAP  Other past medical history reviewed Recent emergency room visits as detailed below ER March 27, 2018 for chest pain atypical ER 02/28/2018 for fall, knee pain ER 02/11/2018 for arm pain ER 02/08/2018 for palpitations and shortness of breath. ER 2/8/209 with HTN ER 01/02/2018 for nausea ER 12/23/2017 for chest pain, atypical ER 12/21/2017 for SOB, was tearful ER 12/06/2017 for palplitations, , [facility] 12/01/2017 for atrial fib with RVR, seen  by dr. Humphrey Rolls Heart rate was 190 bpm, developed after taking cold medication Started on sotolol, Noac As an outpatient had fatigue, did not feel comfortable taking anticoagulation   Prior CV studies:   The following studies were reviewed today:  She has had prior outpatient stress  testing over a year ago  CT scan chest reviewed  from 2018 showing no coronary calcification or aortic atherosclerosis  Sleep test  mild sleep apnea She declined CPAP Stress test through [facility]  Echocardiogram March 14, 2019 1. The left ventricle has normal systolic function with an ejection fraction of 60-65%. The cavity size was normal. There is mild to moderately increased left ventricular wall thickness. Left ventricular diastolic Doppler parameters are indeterminate. 2. The right ventricle has normal systolic function. The cavity was normal. There is no increase in right ventricular wall thickness. 3. Rhythm is atrial fibrillation  Past Medical History:  Diagnosis Date  . Abnormal thyroid function test 05/16/2016  . Anxiety   . Atrial fibrillation ([place])   . 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 Decreased libido 05/16/2016  . Dysrhythmia   . Hypertension   . Hypokalemia   . Impaired fasting glucose   . Morbid obesity ([place]) 06/01/2015  . Morbid obesity ([place])   . Plantar fasciitis    Past Surgical History:  Procedure Laterality Date  . BREAST BIOPSY Right 2015   benign  . ESOPHAGOGASTRODUODENOSCOPY (EGD) WITH PROPOFOL N/A 08/12/2018   Procedure: ESOPHAGOGASTRODUODENOSCOPY (EGD) WITH PROPOFOL;  Surgeon: Toledo, Benay Pike, MD;  Location: [facility] ENDOSCOPY;  Service: Gastroenterology;  Laterality: N/A;  . HAMMER TOE SURGERY       No outpatient medications have been marked as taking for the 05/17/19 encounter (Appointment) with Minna Merritts, MD.     Allergies:   Patient has no known allergies.   Social History   Tobacco Use  . Smoking status: Never Smoker  . Smokeless tobacco: Never Used  Substance Use Topics  . Alcohol use: Yes    Alcohol/week: 0.0 standard drinks    Frequency: Never    Comment: occ  . Drug use: No     Current Outpatient Medications on File Prior to Visit  Medication Sig Dispense Refill  . Crisaborole (EUCRISA) 2 % OINT Apply 1 application  topically daily. 1 Tube 3  . diltiazem (CARDIZEM) 30 MG tablet Take 1 tablet (30 mg total) by mouth 3 (three) times daily as needed (for HR > 120). 30 tablet 0  . pantoprazole (PROTONIX) 40 MG tablet Take 40 mg by mouth daily.    . pantoprazole (PROTONIX) 40 MG tablet Take 1 tablet (40 mg total) by mouth 2 (two) times daily for 30 days. 60 tablet 0   No current facility-administered medications on file prior to visit.      Family Hx: The patient's family history includes Arthritis in her mother; Depression in her mother; Heart disease in her mother; Hypertension in her mother; Stroke in her maternal grandmother.  ROS:   Please see the history of present illness.    Review of Systems  Constitutional: Negative.   Respiratory: Negative.   Cardiovascular: Negative.   Gastrointestinal: Negative.   Musculoskeletal: Negative.   Neurological: Negative.   Psychiatric/Behavioral: The patient is nervous/anxious.   All other systems reviewed and are negative.    Labs/Other Tests and Data Reviewed:    Recent Labs: 03/14/2019: Magnesium 2.0; TSH 3.709 04/30/2019: ALT 14; BUN 12; Creatinine, Ser 0.86; Hemoglobin 12.3; Platelets 273; Potassium 3.9; Sodium 135  Recent Lipid Panel Lab Results  Component Value Date/Time   CHOL 162 12/14/2018 08:38 AM   CHOL 177 07/07/2017 03:43 PM   CHOL 165 03/09/2014 05:29 PM   TRIG 69 12/14/2018 08:38 AM   TRIG 96 03/09/2014 05:29 PM   HDL 46 (L) 12/14/2018 08:38 AM   HDL 49 07/07/2017 03:43 PM   HDL 60 03/09/2014 05:29 PM   CHOLHDL 3.5 12/14/2018 08:38 AM   LDLCALC 100 (H) 12/14/2018 08:38 AM   LDLCALC 86 03/09/2014 05:29 PM    Wt Readings from Last 3 Encounters:  04/30/19 265 lb (120.2 kg)  03/14/19 271 lb 8 oz (123.2 kg)  02/22/19 274 lb 4.8 oz (124.4 kg)     Exam:    Vital Signs: Vital signs may also be detailed in the HPI LMP 04/30/2019    Temp Readings from Last 3 Encounters:  04/30/19 98.3 F (36.8 C) (Oral)  03/14/19 98.2 F (36.8  C) (Oral)  02/22/19 97.8 F (36.6 C)   BP Readings from Last 3 Encounters:  04/30/19 (!) 121/92  03/14/19 112/72  02/22/19 116/78   Pulse Readings from Last 3 Encounters:  04/30/19 75  03/14/19 78  02/22/19 95    Blood pressure 120/80, pulse 70s, respirations 16  Well nourished, well developed female in no acute distress. Constitutional:  oriented to person, place, and time. No distress.  Head: Normocephalic and atraumatic.  Eyes:  no discharge. No scleral icterus.  Neck: Normal range of motion. Neck supple.  Pulmonary/Chest: No audible wheezing, no distress, appears comfortable Musculoskeletal: Normal range of motion.  no  tenderness or deformity.  Neurological:   Coordination normal. Full exam not performed Skin:  No rash Psychiatric:  normal mood and affect. behavior is normal. Thought content normal.    ASSESSMENT & PLAN:    Paroxysmal atrial fibrillation Event monitor reviewed with her showing no atrial fibrillation She has not been taking propranolol or diltiazem as she has been asymptomatic She prefers not to take metoprolol succinate on a daily basis Recommend she take propranolol 20 as needed for tachycardia or palpitations If symptoms persist she could take diltiazem 30 mg pills as needed For persistent symptoms in the future may need to take metoprolol succinate She is hoping not to take medications on a regular basis  Essential hypertension  LVH on echocardiogram Recommended aggressive blood pressure management  Chest pain Chest tightness secondary to atrial fibrillation with RVR heart rates in the 200 range in the emergency room No further ischemic work-up at this time No further episodes, stable   COVID-19 Education: The signs and symptoms of COVID-19 were discussed with the patient and how to seek care for testing (follow up with PCP or arrange E-visit).  The importance of social distancing was discussed today.  Patient Risk:   After full review of  this patients clinical status, I feel that they are at least moderate risk at this time.  Time:   Today, I have spent 25 minutes with the patient with telehealth technology discussing the cardiac and medical problems/diagnoses detailed above   Medication Adjustments/Labs and Tests Ordered: Current medicines are reviewed at length with the patient today.  Concerns regarding medicines are outlined above.   Tests Ordered: No tests ordered   Medication Changes: No changes made   Disposition: Follow-up in 12 months   Signed, Ida Rogue, MD  05/17/2019 3:47 PM    [place] Office [address], [place], [place] [address]

## 2019-05-17 ENCOUNTER — Telehealth (INDEPENDENT_AMBULATORY_CARE_PROVIDER_SITE_OTHER): Payer: 59 | Admitting: Cardiovascular Disease

## 2019-05-17 ENCOUNTER — Other Ambulatory Visit: Payer: Self-pay

## 2019-05-17 DIAGNOSIS — I10 Essential (primary) hypertension: Secondary | ICD-10-CM

## 2019-05-17 DIAGNOSIS — F418 Other specified anxiety disorders: Secondary | ICD-10-CM | POA: Diagnosis not present

## 2019-05-17 DIAGNOSIS — I471 Supraventricular tachycardia: Secondary | ICD-10-CM | POA: Diagnosis not present

## 2019-05-17 DIAGNOSIS — R0789 Other chest pain: Secondary | ICD-10-CM | POA: Diagnosis not present

## 2019-05-17 DIAGNOSIS — R69 Illness, unspecified: Secondary | ICD-10-CM | POA: Diagnosis not present

## 2019-05-17 DIAGNOSIS — I4891 Unspecified atrial fibrillation: Secondary | ICD-10-CM

## 2019-05-17 NOTE — Patient Instructions (Addendum)
Medication Instructions:  Your physician has recommended you make the following change in your medication:  1. CONTINUE Propranolol 20 mg three times a day as needed for tachycardia.  If you need a refill on your cardiac medications before your next appointment, please call your pharmacy.    Lab work: No new labs needed   If you have labs (blood work) drawn today and your tests are completely normal, you will receive your results only by: Marland Kitchen MyChart Message (if you have MyChart) OR . A paper copy in the mail If you have any lab test that is abnormal or we need to change your treatment, we will call you to review the results.   Testing/Procedures: No testing    Follow-Up: At [facility], you and your health needs are our priority.  As part of our continuing mission to provide you with exceptional heart care, we have created designated Provider Care Teams.  These Care Teams include your primary Cardiologist (physician) and Advanced Practice Providers (APPs -  Physician Assistants and Nurse Practitioners) who all work together to provide you with the care you need, when you need it.  . You will need a follow up appointment in 12 months .   Please call our office 2 months in advance to schedule this appointment.    . Providers on your designated Care Team:   . Murray Hodgkins, NP . Christell Faith, PA-C . Marrianne Mood, PA-C  Any Other Special Instructions Will Be Listed Below (If Applicable).  For educational health videos Log in to : [email or web address] Or : [email or web address], password : [email or web address]

## 2019-05-19 NOTE — Telephone Encounter (Signed)
Patient had telehealth visit with provider and options reviewed.

## 2019-05-21 DIAGNOSIS — H5789 Other specified disorders of eye and adnexa: Secondary | ICD-10-CM | POA: Diagnosis not present

## 2019-05-27 DIAGNOSIS — L648 Other androgenic alopecia: Secondary | ICD-10-CM | POA: Diagnosis not present

## 2019-05-27 DIAGNOSIS — L300 Nummular dermatitis: Secondary | ICD-10-CM | POA: Diagnosis not present

## 2019-06-01 ENCOUNTER — Ambulatory Visit: Payer: Managed Care, Other (non HMO) | Admitting: Family Medicine

## 2019-06-03 ENCOUNTER — Telehealth: Payer: Self-pay | Admitting: Cardiovascular Disease

## 2019-06-03 MED ORDER — PROPRANOLOL HCL 20 MG PO TABS: 20.0000 mg | ORAL_TABLET | Freq: Three times a day (TID) | ORAL | 3 refills | Status: DC | PRN

## 2019-06-03 NOTE — Telephone Encounter (Signed)
No answer. Left message to call back.   

## 2019-06-03 NOTE — Telephone Encounter (Signed)
Did not send in refill in error at last visit. Refill sent in to pharmacy listed.

## 2019-06-03 NOTE — Telephone Encounter (Signed)
°*  STAT* If patient is at the pharmacy, call can be transferred to refill team.   1. Which medications need to be refilled? (please list name of each medication and dose if known) Propanolol    2. Which pharmacy/location (including street and city if local pharmacy) is medication to be sent to? [facility] [address]  3. Do they need a 30 day or 90 day supply? 30  ** patient did ask for propanolol - not on med list in epic **   She says she has been taking this and metoprolol also not on med list prn and wants to make sure this is correct

## 2019-06-07 ENCOUNTER — Telehealth: Payer: Self-pay | Admitting: Cardiovascular Disease

## 2019-06-07 NOTE — Telephone Encounter (Signed)
I spoke with the patient. 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 Her FitBit told her her HR was 129 bpm.  She went in and took a diltiazem 30 mg tablet. She laid down and rested for a bit- when she woke up her HR was 99 bpm. She is currently 87 bpm and BP 124/89. BP earlier today she was 119/76 at work.  She states she still feels a little anxiety after having her episode this morning.   She is out of propranolol, but has a RX at the pharmacy to pick up.  Per Dr. Donivan Scull note from her 05/17/19 e-visit: She has not been taking propranolol or diltiazem as she has been asymptomatic She prefers not to take metoprolol succinate on a daily basis Recommend she take propranolol 20 as needed for tachycardia or palpitations If symptoms persist she could take diltiazem 30 mg pills as needed For persistent symptoms in the future may need to take metoprolol succinate She is hoping not to take medications on a regular basis  I have advised the patient she may pick up her propranolol this afternoon and take a dose if she is still feeling anxious and HR's are upper 80-90's.  She states she has a metoprolol succinate RX at home already. She will monitor her symptoms and if there become more persistent, she will try taking the metoprolol daily.  I have asked that she call back and let us know if she starts doing this so we can update her med list.   She voices understanding of the above and is agreeable.

## 2019-06-07 NOTE — Telephone Encounter (Signed)
Patient states she had a HR of 129, she took a Diltiazem when she got off work around 1:00 today, and states she layed down and felt better, but her HR came down to 99. States she doesn't feel "quite right". Please call to discuss.  States she took her BP at work this a.m. and it was 117/70

## 2019-08-04 DIAGNOSIS — Z20828 Contact with and (suspected) exposure to other viral communicable diseases: Secondary | ICD-10-CM | POA: Diagnosis not present

## 2019-08-23 ENCOUNTER — Encounter: Payer: Self-pay | Admitting: Emergency Medicine

## 2019-08-23 ENCOUNTER — Other Ambulatory Visit: Payer: Self-pay

## 2019-08-23 DIAGNOSIS — Z5321 Procedure and treatment not carried out due to patient leaving prior to being seen by health care provider: Secondary | ICD-10-CM | POA: Diagnosis not present

## 2019-08-23 DIAGNOSIS — M542 Cervicalgia: Secondary | ICD-10-CM | POA: Diagnosis present

## 2019-08-23 LAB — CBC
HCT: 36.8 % (ref 36.0–46.0)
Hemoglobin: 12.3 g/dL (ref 12.0–15.0)
MCH: 31.5 pg (ref 26.0–34.0)
MCHC: 33.4 g/dL (ref 30.0–36.0)
MCV: 94.4 fL (ref 80.0–100.0)
Platelets: 292 10*3/uL (ref 150–400)
RBC: 3.9 MIL/uL (ref 3.87–5.11)
RDW: 12.7 % (ref 11.5–15.5)
WBC: 9 10*3/uL (ref 4.0–10.5)
nRBC: 0 % (ref 0.0–0.2)

## 2019-08-23 LAB — BASIC METABOLIC PANEL
Anion gap: 10 (ref 5–15)
BUN: 12 mg/dL (ref 6–20)
CO2: 23 mmol/L (ref 22–32)
Calcium: 8.8 mg/dL — ABNORMAL LOW (ref 8.9–10.3)
Chloride: 104 mmol/L (ref 98–111)
Creatinine, Ser: 0.96 mg/dL (ref 0.44–1.00)
GFR calc Af Amer: >60 mL/min (ref >60)
GFR calc non Af Amer: >60 mL/min (ref >60)
Glucose, Bld: 124 mg/dL — ABNORMAL HIGH (ref 70–99)
Potassium: 3.5 mmol/L (ref 3.5–5.1)
Sodium: 137 mmol/L (ref 135–145)

## 2019-08-23 LAB — TROPONIN I (HIGH SENSITIVITY): Troponin I (High Sensitivity): 3 ng/L (ref <18)

## 2019-08-23 NOTE — ED Triage Notes (Signed)
Pt arrived via POV with reports of neck pain that is worse on the left and left hand pain.  Pt states the neck pain started around 6pm today and the hand pain has been off and on all day.  Pt states her heart rate has been irregular as well, states she has hx of afib, but is not on any medication for it.  Pt states she took ibuprofen PTA and took her BP medication today.

## 2019-08-23 NOTE — ED Notes (Signed)
Pt asking for water, informed her that she has to wait until the doctor says it is ok for her to have something to drink. Pt overheard asking someone for change so she can get a drink.

## 2019-08-24 ENCOUNTER — Telehealth: Payer: Self-pay | Admitting: Cardiovascular Disease

## 2019-08-24 ENCOUNTER — Emergency Department
Admission: EM | Admit: 2019-08-24 | Discharge: 2019-08-24 | Discharge status: Against Medical Advice | Payer: 59 | Attending: Emergency Medicine | Admitting: Emergency Medicine

## 2019-08-24 NOTE — ED Notes (Signed)
No answer when called several times from lobby 

## 2019-08-24 NOTE — ED Notes (Signed)
No answr when called several times from lobby

## 2019-08-24 NOTE — Telephone Encounter (Signed)
Patient c/o Palpitations:  High priority if patient c/o lightheadedness, shortness of breath, or chest pain  1) How long have you had palpitations/irregular HR/ Afib? Are you having the symptoms now? Slow HR has been going on for 2 days  Are you currently experiencing lightheadedness, SOB or CP? N/a  2) Do you have a history of afib (atrial fibrillation) or irregular heart rhythm? Yes, a fib twice  3) Have you checked your BP or HR? (document readings if available):  BP 141/90  HR 67, unsure if it was high due to pain or something else  4) Are you experiencing any other symptoms? Neck pain comes and goes and left foot is cold (normal color) but trying to warm up with socks   Went to ed last night and took blood work and did EKG, patient left because the wait was too long.

## 2019-08-24 NOTE — Telephone Encounter (Signed)
Left voicemail message to call back  

## 2019-08-25 ENCOUNTER — Ambulatory Visit (INDEPENDENT_AMBULATORY_CARE_PROVIDER_SITE_OTHER): Payer: 59 | Admitting: Family Medicine

## 2019-08-25 ENCOUNTER — Other Ambulatory Visit: Payer: Self-pay

## 2019-08-25 ENCOUNTER — Encounter: Payer: Self-pay | Admitting: Family Medicine

## 2019-08-25 NOTE — Telephone Encounter (Signed)
Patient is returning your call.  

## 2019-08-25 NOTE — Telephone Encounter (Signed)
Spoke with patient and she states that she had some neck pain off and on. She went to ED for evaluation of that and low heart rates but wait was so long that she left without being seen. She did say that they obtained labs and EKG. EKG was normal and they only did one troponin which was 3. She states that normally her heart rates are high and requires propranolol at times. She has not had propranolol this week and has noticed increased anxiety and fatigue. Heart rates have been 55-60's when normally she is fast. At this moment her heart rate is 97. She states with the lower rates she does not feel good and notices more fatigue with that. Neck pain is on the left with no radiation and it comes and goes. She also reports some tingling in her hand as well. She thinks it could be anxiety related given that her husband has been diagnosed with colon cancer and her granddaughter went into the army. She does have upcoming appointment with her primary provider at the end of the week and encouraged her to let her know about the neck pain and tingling in the hand. For her heart rates I did review that propranolol and diltiazem that she has for as needed would cause lower rates so caution using if heart rates are not elevated. She mentioned trying her anxiety medication which she doesn't like to take to see if that will help as well. Advised that I would make Dr. [name] aware of her lower heart rates and fatigue but to please call back if this should persist or worsen and to hold the meds if low rates.

## 2019-08-26 NOTE — Telephone Encounter (Signed)
Long hx of anxiety, negative cardiac workup She should discuss with PMD

## 2019-08-26 NOTE — Progress Notes (Signed)
Cancelled appt

## 2019-08-27 ENCOUNTER — Other Ambulatory Visit: Payer: Self-pay

## 2019-08-27 ENCOUNTER — Ambulatory Visit (INDEPENDENT_AMBULATORY_CARE_PROVIDER_SITE_OTHER): Payer: 59 | Admitting: Family Medicine

## 2019-08-27 ENCOUNTER — Encounter: Payer: Self-pay | Admitting: Family Medicine

## 2019-08-27 VITALS — BP 126/76 | HR 74 | Temp 97.3°F | Resp 16 | Ht 66.0 in | Wt 263.5 lb

## 2019-08-27 DIAGNOSIS — I471 Supraventricular tachycardia: Secondary | ICD-10-CM | POA: Diagnosis not present

## 2019-08-27 DIAGNOSIS — I480 Paroxysmal atrial fibrillation: Secondary | ICD-10-CM

## 2019-08-27 DIAGNOSIS — M542 Cervicalgia: Secondary | ICD-10-CM | POA: Diagnosis not present

## 2019-08-27 DIAGNOSIS — R69 Illness, unspecified: Secondary | ICD-10-CM | POA: Diagnosis not present

## 2019-08-27 DIAGNOSIS — I4891 Unspecified atrial fibrillation: Secondary | ICD-10-CM

## 2019-08-27 DIAGNOSIS — Z1231 Encounter for screening mammogram for malignant neoplasm of breast: Secondary | ICD-10-CM | POA: Diagnosis not present

## 2019-08-27 DIAGNOSIS — Z6379 Other stressful life events affecting family and household: Secondary | ICD-10-CM

## 2019-08-27 DIAGNOSIS — F419 Anxiety disorder, unspecified: Secondary | ICD-10-CM

## 2019-08-27 MED ORDER — TIZANIDINE HCL 4 MG PO TABS: 4.0000 mg | ORAL_TABLET | Freq: Three times a day (TID) | ORAL | 0 refills | Status: DC | PRN

## 2019-08-27 MED ORDER — HYDROXYZINE HCL 10 MG PO TABS: 10.0000 mg | ORAL_TABLET | Freq: Three times a day (TID) | ORAL | 1 refills | Status: DC | PRN

## 2019-08-27 NOTE — Progress Notes (Signed)
Name: Lori Oliver   MRN: [id number]    DOB: [birth year]   Date:08/27/2019       Progress Note  Subjective  Chief Complaint  Chief Complaint  Patient presents with  . Follow-up  . Neck Pain    went to ER but did not get seen    HPI  PT presents for follow up:  LEFT Neck Pain and LEFT arm pain: She notes this occurred on 08/24/2019.  She went to the ER and had labs and EKG done, then left without being seen.  She went home and went to sleep, felt really exhausted when she woke up.  She notes that the arm pain has resolved, however she is still having some left sided neck pain.  Tried ibuprofen and it did not help the pain.    Anxiety/Stress: Her husband has been sick with cancer and is going through chemo at this time.  She notes she has a church community, family, friends that are supportive. She has EAP with free counseling with her job and is going to contact them to set up an appointment. She is sleeping okay; working 2 jobs, trying to be there for her husband. She states her heart rate fluctuates, but no noticeable panic attacks.  Discussed medication options in detail.  She does not want to take a daily medication, only wants PRN medication at this time; she will think about Lexapro and let us know if she'd like to try.  Afib RVR intermittent/HTN: She has been checking her HR and BP at home; notes HR has been in the 60's-70's regularly, BP's have been WNL most of the time. She is seeing Dr. [name] for her Afib RVR and HTN.   She has been taking 12.5mg  HCTZ 2-3 times a week, though it is not on her active med list - we will d/c this for now and check BP's at home for about 3 weeks. She denies chest pain, shortness of breath.  No recent Afib episodes.   GERD:  Taking Protonix PRN and this seems to help for the most part.  Pizza and other foods seem to be triggers.  No difficulty swallowing, abdominal pain, no blood in stool, dark and tarry stools.  Has hx esophageal stretching in the  past with [facility].  Obesity: She is down 11lbs, she is walking several times a week and diet is balanced.  Patient Active Problem List   Diagnosis Date Noted  . Atrial tachycardia ([place]) 05/17/2019  . Chest tightness 05/17/2019  . Intermittent atrial fibrillation ([place]) 02/19/2018  . Hypokalemia 02/10/2018  . Anxiety about health 01/13/2018  . Atrial fibrillation with RVR ([place]) 12/01/2017  . Vitamin D deficiency 07/08/2017  . Lower back pain 07/07/2017  . Morbid obesity ([place]) 07/07/2017  . Essential hypertension 07/11/2016  . IFG (impaired fasting glucose) 05/16/2016  . 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 Abnormal thyroid function test 05/16/2016  . Decreased libido 05/16/2016  . Breast lump on left side at 5 o'clock position 05/16/2016  . Bilateral thoracic back pain 06/01/2015  . History of abnormal cervical Pap smear 06/01/2015    Past Surgical History:  Procedure Laterality Date  . BREAST BIOPSY Right 2015   benign  . ESOPHAGOGASTRODUODENOSCOPY (EGD) WITH PROPOFOL N/A 08/12/2018   Procedure: ESOPHAGOGASTRODUODENOSCOPY (EGD) WITH PROPOFOL;  Surgeon: Toledo, Benay Pike, MD;  Location: [facility] ENDOSCOPY;  Service: Gastroenterology;  Laterality: N/A;  . HAMMER TOE SURGERY      Family History  Problem Relation  Age of Onset  . Hypertension Mother   . Heart disease Mother   . Arthritis Mother   . Depression Mother   . Stroke Maternal Grandmother     Social History   Socioeconomic History  . Marital status: Married    Spouse name: Coralyn Mark  . Number of children: 1  . Years of education: 81  . Highest education level: Some college, no degree  Occupational History  . Not on file  Social Needs  . Financial resource strain: Not hard at all  . Food insecurity    Worry: Never true    Inability: Never true  . Transportation needs    Medical: No    Non-medical: No  Tobacco Use  . Smoking status: Never Smoker  . Smokeless tobacco: Never Used  Substance and Sexual  Activity  . Alcohol use: Yes    Alcohol/week: 0.0 standard drinks    Frequency: Never    Comment: occ  . Drug use: No  . Sexual activity: Yes    Partners: Male    Birth control/protection: Implant, None  Lifestyle  . Physical activity    Days per week: 0 days    Minutes per session: 0 min  . Stress: Not at all  Relationships  . Social Herbalist on phone: Once a week    Gets together: Once a week    Attends religious service: More than 4 times per year    Active member of club or organization: Yes    Attends meetings of clubs or organizations: More than 4 times per year    Relationship status: Married  . Intimate partner violence    Fear of current or ex partner: No    Emotionally abused: No    Physically abused: No    Forced sexual activity: No  Other Topics Concern  . Not on file  Social History Narrative   Husband was diagnosed with Cancer this past Summer.     Current Outpatient Medications:  .  pantoprazole (PROTONIX) 40 MG tablet, Take 40 mg by mouth daily., Disp: , Rfl:  .  Crisaborole (EUCRISA) 2 % OINT, Apply 1 application topically daily. (Patient not taking: Reported on 08/27/2019), Disp: 1 Tube, Rfl: 3 .  diltiazem (CARDIZEM) 30 MG tablet, Take 1 tablet (30 mg total) by mouth 3 (three) times daily as needed (for HR > 120). (Patient not taking: Reported on 08/27/2019), Disp: 30 tablet, Rfl: 0 .  pantoprazole (PROTONIX) 40 MG tablet, Take 1 tablet (40 mg total) by mouth 2 (two) times daily for 30 days., Disp: 60 tablet, Rfl: 0 .  propranolol (INDERAL) 20 MG tablet, Take 1 tablet (20 mg total) by mouth 3 (three) times daily as needed (as needed for tachycardia). (Patient not taking: Reported on 08/27/2019), Disp: 90 tablet, Rfl: 3  No Known Allergies  I personally reviewed active problem list, medication list, allergies, health maintenance, notes from last encounter, lab results with the patient/caregiver today.   ROS  Constitutional: Negative for fever  or weight change.  Respiratory: Negative for cough and shortness of breath.   Cardiovascular: Negative for chest pain or palpitations.  Gastrointestinal: Negative for abdominal pain, no bowel changes.  Musculoskeletal: Negative for gait problem or joint swelling.  Skin: Negative for rash.  Neurological: Negative for dizziness or headache.  No other specific complaints in a complete review of systems (except as listed in HPI above).  Objective  Vitals:   08/27/19 0845  BP: 126/76  Pulse:  74  Resp: 16  Temp: (!) 97.3 F (36.3 C)  TempSrc: Oral  SpO2: 99%  Weight: 263 lb 8 oz (119.5 kg)  Height: 5\' 6"  (1.676 m)   Body mass index is 42.53 kg/m.  Physical Exam  Constitutional: Patient appears well-developed and well-nourished. No distress.  HENT: Head: Normocephalic and atraumatic. E Eyes: Conjunctivae and EOM are normal. No scleral icterus. Neck: Normal range of motion. Neck supple. No JVD present. Cardiovascular: Normal rate, regular rhythm and normal heart sounds.  No murmur heard. No BLE edema. Pulmonary/Chest: Effort normal and breath sounds normal.  Abdominal: Soft. Bowel sounds are normal, no distension. There is no tenderness. No masses. Musculoskeletal: Normal range of motion, no joint effusions. No gross deformities Neurological: Pt is alert and oriented to person, place, and time. No cranial nerve deficit. Coordination, balance, strength, speech and gait are normal.  Skin: Skin is warm and dry. No rash noted. No erythema.  Psychiatric: Patient has a normal mood and affect. behavior is normal. Judgment and thought content normal.  No results found for this or any previous visit (from the past 72 hour(s)).   PHQ2/9: Depression screen [facility] 2/9 08/27/2019 02/22/2019 01/04/2019 12/25/2018 11/30/2018  Decreased Interest 0 1 0 0 0  Down, Depressed, Hopeless 0 1 1 1 1   PHQ - 2 Score 0 2 1 1 1   Altered sleeping 0 1 0 0 0  Tired, decreased energy 0 1 0 0 0  Change in appetite  0 0 0 0 0  Feeling bad or failure about yourself  0 1 1 1 3   Trouble concentrating 0 0 1 1 0  Moving slowly or fidgety/restless 0 0 0 0 0  Suicidal thoughts 0 0 0 0 0  PHQ-9 Score 0 5 3 3 4   Difficult doing work/chores Not difficult at all Not difficult at all - - -  Some recent data might be hidden   PHQ-2/9 Result is negative.    Fall Risk: Fall Risk  08/27/2019 02/22/2019 01/04/2019 12/25/2018 11/30/2018  Falls in the past year? 0 0 1 1 1   Number falls in past yr: 0 - 0 0 0  Injury with Fall? 0 - 1 1 1   Comment - - - - -  Follow up Falls evaluation completed - - - -    Assessment & Plan  1. Atrial fibrillation with RVR ([place]) 2. Intermittent atrial fibrillation (H[facility]) 3. Atrial tachycardia ([facility]) - Taking propanolol and Dilt PRN; stop HCTZ for 3 weeks and check back in with home BP's at that time.   4. Neck pain on left side - tiZANidine (ZANAFLEX) 4 MG tablet; Take 1 tablet (4 mg total) by mouth every 8 (eight) hours as needed for muscle spasms.  Dispense: 30 tablet; Refill: 0  5. Anxiety - hydrOXYzine (ATARAX/VISTARIL) 10 MG tablet; Take 1 tablet (10 mg total) by mouth 3 (three) times daily as needed for anxiety (and stress).  Dispense: 30 tablet; Refill: 1  6. Stress due to illness of family member - hydrOXYzine (ATARAX/VISTARIL) 10 MG tablet; Take 1 tablet (10 mg total) by mouth 3 (three) times daily as needed for anxiety (and stress).  Dispense: 30 tablet; Refill: 1  7. Breast cancer screening by mammogram - MM 3D SCREEN BREAST BILATERAL; Future

## 2019-09-10 ENCOUNTER — Ambulatory Visit: Payer: Self-pay

## 2019-09-10 NOTE — Telephone Encounter (Signed)
Pt. Calling in to report elevated BP per [facility] agent. Call dropped and called pt. Back and left a message to call back.

## 2019-09-10 NOTE — Telephone Encounter (Signed)
Can she go back on BP medication

## 2019-09-10 NOTE — Telephone Encounter (Signed)
Pt. Reports her PCP took her off BP medications 08/27/19. Has been doing well until yesterday. Her employer did rapid COVID 19 test and she is positive. BP today 147/100 and 150/100. No headache or other symptom, other than dry, sore throat. Spoke with Melissa and will forward triage over for review.

## 2019-09-10 NOTE — Telephone Encounter (Signed)
  Answer Assessment - Initial Assessment Questions 1. BLOOD PRESSURE: "What is the blood pressure?" "Did you take at least two measurements 5 minutes apart?"     147/100 2. ONSET: "When did you take your blood pressure?"     I hour 3. HOW: "How did you obtain the blood pressure?" (e.g., visiting nurse, automatic home BP monitor)     Home monitor 4. HISTORY: "Do you have a history of high blood pressure?"     Yes 5. MEDICATIONS: "Are you taking any medications for blood pressure?" "Have you missed any doses recently?"     PCP stopped her meds 08/27/19 6. OTHER SYMPTOMS: "Do you have any symptoms?" (e.g., headache, chest pain, blurred vision, difficulty breathing, weakness)     No 7. PREGNANCY: "Is there any chance you are pregnant?" "When was your last menstrual period?"     No  Protocols used: HIGH BLOOD PRESSURE-A-AH

## 2019-09-11 MED ORDER — HYDROCHLOROTHIAZIDE 12.5 MG PO TABS: 12.5000 mg | ORAL_TABLET | Freq: Every day | ORAL | 0 refills | Status: DC

## 2019-09-11 NOTE — Addendum Note (Signed)
Addended by: Steele Sizer F on: 09/11/2019 03:04 PM   Modules accepted: Orders

## 2019-09-13 ENCOUNTER — Other Ambulatory Visit: Payer: Self-pay

## 2019-09-13 DIAGNOSIS — Z20822 Contact with and (suspected) exposure to covid-19: Secondary | ICD-10-CM

## 2019-09-13 DIAGNOSIS — R6889 Other general symptoms and signs: Secondary | ICD-10-CM | POA: Diagnosis not present

## 2019-09-13 NOTE — Telephone Encounter (Signed)
Patient called.  Patient aware.  

## 2019-09-14 LAB — NOVEL CORONAVIRUS, NAA: SARS-CoV-2, NAA: NOT DETECTED

## 2019-09-16 ENCOUNTER — Telehealth: Payer: Self-pay | Admitting: Family Medicine

## 2019-09-16 NOTE — Telephone Encounter (Signed)
Copied from [place] [id number]. Topic: General - Inquiry >> Sep 16, 2019  3:27 PM Scherrie Gerlach wrote: Reason for CRM: pt needs a copy of her covid results faxed to her work office. Trilby Drummer [id number]  Pt states they asked if she could get something sent from her dr's office with negative results.     Called patient and informed her that the results have been faxed to [facility] per patient request.

## 2019-09-17 ENCOUNTER — Telehealth: Payer: Self-pay | Admitting: Family Medicine

## 2019-09-17 MED ORDER — HYDROCHLOROTHIAZIDE 12.5 MG PO TABS: 12.5000 mg | ORAL_TABLET | Freq: Every day | ORAL | 0 refills | Status: DC

## 2019-09-17 NOTE — Telephone Encounter (Signed)
Patient stated that her BP this morning was 118/72.  One day it was 120/84 and another day it was 125/93.  Pt states it seems that her BP goes up when she is stressed out. Pt wants to know if she needs to continue taking BP medication.

## 2019-09-17 NOTE — Telephone Encounter (Addendum)
-----   Message from Hubbard Hartshorn, [place] sent at 08/27/2019  9:23 AM EDT ----- Regarding: BP Check in Please call patient to ask about her BP's at home over the last few weeks

## 2019-09-17 NOTE — Addendum Note (Signed)
Addended by: Hubbard Hartshorn on: 09/17/2019 03:05 PM   Modules accepted: Orders

## 2019-09-17 NOTE — Telephone Encounter (Signed)
Patient notified and verbalized understanding.  Patient would like BP medication sent to her pharmacy, [facility] on [address].  Patient stated that her pharmacy never received the rx from 09/11/2019.

## 2019-09-17 NOTE — Telephone Encounter (Signed)
Yes - her bottom numbers are still on the high end of normal, but the medication seems to be helping.  Continue medications.

## 2019-09-22 DIAGNOSIS — Z23 Encounter for immunization: Secondary | ICD-10-CM | POA: Diagnosis not present

## 2019-09-27 ENCOUNTER — Telehealth: Payer: Self-pay | Admitting: Family Medicine

## 2019-09-27 NOTE — Telephone Encounter (Signed)
Pt went to pharm yesterday and they do not have hctz. [facility] in [place] on [address]

## 2019-09-27 NOTE — Telephone Encounter (Signed)
Called left message for pharmacy

## 2019-09-28 ENCOUNTER — Encounter: Payer: Self-pay | Admitting: Family Medicine

## 2019-09-28 ENCOUNTER — Other Ambulatory Visit: Payer: Self-pay

## 2019-09-28 ENCOUNTER — Ambulatory Visit (INDEPENDENT_AMBULATORY_CARE_PROVIDER_SITE_OTHER): Payer: 59 | Admitting: Family Medicine

## 2019-09-28 VITALS — BP 128/84 | HR 100 | Temp 97.3°F | Resp 16 | Ht 66.0 in | Wt 267.5 lb

## 2019-09-28 DIAGNOSIS — I10 Essential (primary) hypertension: Secondary | ICD-10-CM | POA: Diagnosis not present

## 2019-09-28 DIAGNOSIS — N3001 Acute cystitis with hematuria: Secondary | ICD-10-CM

## 2019-09-28 LAB — POCT URINALYSIS DIPSTICK
Bilirubin, UA: NEGATIVE
Glucose, UA: NEGATIVE
Ketones, UA: NEGATIVE
Leukocytes, UA: NEGATIVE
Nitrite, UA: NEGATIVE
Protein, UA: NEGATIVE
Spec Grav, UA: 1.015 (ref 1.010–1.025)
Urobilinogen, UA: 0.2 E.U./dL
pH, UA: 5 (ref 5.0–8.0)

## 2019-09-28 MED ORDER — NITROFURANTOIN MONOHYD MACRO 100 MG PO CAPS: 100.0000 mg | ORAL_CAPSULE | Freq: Two times a day (BID) | ORAL | 0 refills | Status: AC

## 2019-09-28 NOTE — Progress Notes (Signed)
Name: Lori Oliver   MRN: [id number]    DOB: 1971/05/20   Date:09/28/2019       Progress Note  Subjective  Chief Complaint  Urinary Tract Infection with discomfort   HPI  This patient presents today with complaint of UTI symptoms for 6 days with polyuria and soreness in her pelvis. She endorses the use of AZO and cranberry juice. She denies frank hematuria, vaginal discharge or malodorous urine, notes the color of hero urine is darker. Denies sexual intercourse. No nausea, vomiting, or diarrhea.  No history of kidney stones.  -HTN: She stopped HCTZ 2-3 weeks ago after our last visit because she was only taking it 2-3 times a week.  BP was midly elevated at home, so I advised to restart; she has not been taking consistently and prefers to trial off of the medication again.  BP is at goal today and she has not taken HCTZ in several days so I am agreeable to this.    Patient Active Problem List   Diagnosis Date Noted  . Atrial tachycardia ([place]) 05/17/2019  . Chest tightness 05/17/2019  . Intermittent atrial fibrillation ([place]) 02/19/2018  . Hypokalemia 02/10/2018  . Atrial fibrillation with RVR ([place]) 12/01/2017  . Vitamin D deficiency 07/08/2017  . Lower back pain 07/07/2017  . Morbid obesity ([place]) 07/07/2017  . Essential hypertension 07/11/2016  . IFG (impaired fasting glucose) 05/16/2016  . 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 Abnormal thyroid function test 05/16/2016  . Decreased libido 05/16/2016  . Breast lump on left side at 5 o'clock position 05/16/2016  . Bilateral thoracic back pain 06/01/2015  . History of abnormal cervical Pap smear 06/01/2015    Social History   Tobacco Use  . Smoking status: Never Smoker  . Smokeless tobacco: Never Used  Substance Use Topics  . Alcohol use: Yes    Alcohol/week: 0.0 standard drinks    Frequency: Never    Comment: occ     Current Outpatient Medications:  .  diltiazem (CARDIZEM) 30 MG tablet, Take 1 tablet (30 mg  total) by mouth 3 (three) times daily as needed (for HR > 120)., Disp: 30 tablet, Rfl: 0 .  hydrochlorothiazide (HYDRODIURIL) 12.5 MG tablet, Take 1 tablet (12.5 mg total) by mouth daily., Disp: 90 tablet, Rfl: 0 .  hydrOXYzine (ATARAX/VISTARIL) 10 MG tablet, Take 1 tablet (10 mg total) by mouth 3 (three) times daily as needed for anxiety (and stress)., Disp: 30 tablet, Rfl: 1 .  pantoprazole (PROTONIX) 40 MG tablet, Take 1 tablet (40 mg total) by mouth 2 (two) times daily for 30 days., Disp: 60 tablet, Rfl: 0 .  propranolol (INDERAL) 20 MG tablet, Take 1 tablet (20 mg total) by mouth 3 (three) times daily as needed (as needed for tachycardia)., Disp: 90 tablet, Rfl: 3 .  tiZANidine (ZANAFLEX) 4 MG tablet, Take 1 tablet (4 mg total) by mouth every 8 (eight) hours as needed for muscle spasms., Disp: 30 tablet, Rfl: 0  No Known Allergies  I personally reviewed active problem list, medication list, allergies, notes from last encounter, lab results with the patient/caregiver today.  ROS  Ten systems reviewed and is negative except as mentioned in HPI  Objective  Vitals:   09/28/19 1405  BP: 128/84  Pulse: 100  Resp: 16  Temp: (!) 97.3 F (36.3 C)  TempSrc: Temporal  SpO2: 99%  Weight: 267 lb 8 oz (121.3 kg)  Height: 5\' 6"  (1.676 m)    Body mass index is  43.18 kg/m.  Nursing Note and Vital Signs reviewed.  Physical Exam  Constitutional: Patient appears well-developed and well-nourished. No distress.  HENT: Head: Normocephalic and atraumatic. Eyes: Conjunctivae and EOM are normal. No scleral icterus.  Neck: Normal range of motion. Neck supple. No JVD present.  Cardiovascular: Normal rate, regular rhythm and normal heart sounds.  No murmur heard. No BLE edema. Pulmonary/Chest: Effort normal and breath sounds normal. No respiratory distress. Abdominal: Soft. Bowel sounds are normal, no distension. There is mild tenderness in the pelvic area only. No masses. No CVA tenderness.  Musculoskeletal: Normal range of motion, no joint effusions. No gross deformities Neurological: Pt is alert and oriented to person, place, and time. No cranial nerve deficit. Coordination, balance, strength, speech and gait are normal.  Skin: Skin is warm and dry. No rash noted. No erythema.  Psychiatric: Patient has a normal mood and affect. behavior is normal. Judgment and thought content normal.   No results found for this or any previous visit (from the past 72 hour(s)).  Assessment & Plan  1. Acute cystitis with hematuria - POCT urinalysis dipstick - Urine Culture - nitrofurantoin, macrocrystal-monohydrate, (MACROBID) 100 MG capsule; Take 1 capsule (100 mg total) by mouth 2 (two) times daily for 5 days.  Dispense: 10 capsule; Refill: 0  2. Essential hypertension - D/C HCTZ for now; will message in 2-3 weeks with BP readings.   -Red flags and when to present for emergency care or RTC including fever >101.83F, chest pain, shortness of breath, new/worsening/un-resolving symptoms, reviewed with patient at time of visit. Follow up and care instructions discussed and provided in AVS.

## 2019-09-28 NOTE — Patient Instructions (Signed)
-   Monitor BP at home - if above 140/90, call our office and let us know.  Message me in 2-3 weeks with your BP readings.

## 2019-09-29 ENCOUNTER — Ambulatory Visit: Payer: 59 | Admitting: Family Medicine

## 2019-09-30 LAB — URINE CULTURE
MICRO NUMBER:: 986101
SPECIMEN QUALITY:: ADEQUATE

## 2019-10-03 ENCOUNTER — Emergency Department: Payer: 59

## 2019-10-03 ENCOUNTER — Emergency Department
Admission: EM | Admit: 2019-10-03 | Discharge: 2019-10-03 | Disposition: A | Discharge status: Home / Self Care | Payer: 59 | Attending: Student | Admitting: Student

## 2019-10-03 ENCOUNTER — Other Ambulatory Visit: Payer: Self-pay

## 2019-10-03 ENCOUNTER — Encounter: Payer: Self-pay | Admitting: Emergency Medicine

## 2019-10-03 DIAGNOSIS — I10 Essential (primary) hypertension: Secondary | ICD-10-CM | POA: Diagnosis not present

## 2019-10-03 DIAGNOSIS — R109 Unspecified abdominal pain: Secondary | ICD-10-CM | POA: Diagnosis not present

## 2019-10-03 DIAGNOSIS — R350 Frequency of micturition: Secondary | ICD-10-CM | POA: Insufficient documentation

## 2019-10-03 DIAGNOSIS — K449 Diaphragmatic hernia without obstruction or gangrene: Secondary | ICD-10-CM | POA: Diagnosis not present

## 2019-10-03 DIAGNOSIS — R1030 Lower abdominal pain, unspecified: Secondary | ICD-10-CM | POA: Diagnosis not present

## 2019-10-03 DIAGNOSIS — Z79899 Other long term (current) drug therapy: Secondary | ICD-10-CM | POA: Insufficient documentation

## 2019-10-03 DIAGNOSIS — D259 Leiomyoma of uterus, unspecified: Secondary | ICD-10-CM | POA: Insufficient documentation

## 2019-10-03 LAB — COMPREHENSIVE METABOLIC PANEL
ALT: 13 U/L (ref 0–44)
AST: 15 U/L (ref 15–41)
Albumin: 3.8 g/dL (ref 3.5–5.0)
Alkaline Phosphatase: 67 U/L (ref 38–126)
Anion gap: 9 (ref 5–15)
BUN: 10 mg/dL (ref 6–20)
CO2: 26 mmol/L (ref 22–32)
Calcium: 8.9 mg/dL (ref 8.9–10.3)
Chloride: 104 mmol/L (ref 98–111)
Creatinine, Ser: 0.82 mg/dL (ref 0.44–1.00)
GFR calc Af Amer: >60 mL/min (ref >60)
GFR calc non Af Amer: >60 mL/min (ref >60)
Glucose, Bld: 107 mg/dL — ABNORMAL HIGH (ref 70–99)
Potassium: 4 mmol/L (ref 3.5–5.1)
Sodium: 139 mmol/L (ref 135–145)
Total Bilirubin: 0.6 mg/dL (ref 0.3–1.2)
Total Protein: 7.7 g/dL (ref 6.5–8.1)

## 2019-10-03 LAB — CBC WITH DIFFERENTIAL/PLATELET
Abs Immature Granulocytes: 0.02 10*3/uL (ref 0.00–0.07)
Basophils Absolute: 0 10*3/uL (ref 0.0–0.1)
Basophils Relative: 0 %
Eosinophils Absolute: 0.2 10*3/uL (ref 0.0–0.5)
Eosinophils Relative: 3 %
HCT: 37.4 % (ref 36.0–46.0)
Hemoglobin: 12.5 g/dL (ref 12.0–15.0)
Immature Granulocytes: 0 %
Lymphocytes Relative: 37 %
Lymphs Abs: 2.5 10*3/uL (ref 0.7–4.0)
MCH: 31.6 pg (ref 26.0–34.0)
MCHC: 33.4 g/dL (ref 30.0–36.0)
MCV: 94.4 fL (ref 80.0–100.0)
Monocytes Absolute: 0.4 10*3/uL (ref 0.1–1.0)
Monocytes Relative: 6 %
Neutro Abs: 3.6 10*3/uL (ref 1.7–7.7)
Neutrophils Relative %: 54 %
Platelets: 288 10*3/uL (ref 150–400)
RBC: 3.96 MIL/uL (ref 3.87–5.11)
RDW: 12.4 % (ref 11.5–15.5)
WBC: 6.7 10*3/uL (ref 4.0–10.5)
nRBC: 0 % (ref 0.0–0.2)

## 2019-10-03 LAB — URINALYSIS, COMPLETE (UACMP) WITH MICROSCOPIC
Bilirubin Urine: NEGATIVE
Glucose, UA: NEGATIVE mg/dL
Hgb urine dipstick: NEGATIVE
Ketones, ur: NEGATIVE mg/dL
Leukocytes,Ua: NEGATIVE
Nitrite: NEGATIVE
Protein, ur: NEGATIVE mg/dL
Specific Gravity, Urine: 1.006 (ref 1.005–1.030)
pH: 7 (ref 5.0–8.0)

## 2019-10-03 LAB — PREGNANCY, URINE: Preg Test, Ur: NEGATIVE

## 2019-10-03 MED ORDER — SODIUM CHLORIDE 0.9 % IV BOLUS
1000.0000 mL | Freq: Once | INTRAVENOUS | Status: AC
  Administered 2019-10-03: 1000 mL via INTRAVENOUS

## 2019-10-03 MED ORDER — MORPHINE SULFATE (PF) 4 MG/ML IV SOLN
4.0000 mg | Freq: Once | INTRAVENOUS | Status: DC
  Filled 2019-10-03: qty 1

## 2019-10-03 MED ORDER — ONDANSETRON HCL 4 MG/2ML IJ SOLN
4.0000 mg | Freq: Once | INTRAMUSCULAR | Status: AC
  Administered 2019-10-03: 4 mg via INTRAVENOUS
  Filled 2019-10-03: qty 2

## 2019-10-03 MED ORDER — IBUPROFEN 600 MG PO TABS: 600.0000 mg | ORAL_TABLET | Freq: Four times a day (QID) | ORAL | 0 refills | Status: AC | PRN

## 2019-10-03 MED ORDER — KETOROLAC TROMETHAMINE 30 MG/ML IJ SOLN
30.0000 mg | Freq: Once | INTRAMUSCULAR | Status: AC
  Administered 2019-10-03: 30 mg via INTRAVENOUS
  Filled 2019-10-03: qty 1

## 2019-10-03 NOTE — ED Notes (Signed)
Pt given cup for urine sample. Pt states that she is unable to give sample at this time

## 2019-10-03 NOTE — Discharge Instructions (Signed)
Thank you for letting us take care of you in the emergency department today.   Please continue to take any regular, prescribed medications.   New medications we have prescribed:  - ibuprofen, take 600 mg every 6 hours for the next few days. If you have reflux or sensitive stomach, obtain and over the counter anti-acid medication and take as directed while on ibuprofen  Please follow up with: - Your OB/GYN - Your primary care doctor to review your ER visit and follow up on your symptoms.   Please return to the ER for any new or worsening symptoms.

## 2019-10-03 NOTE — ED Notes (Signed)
Family at bedside. 

## 2019-10-03 NOTE — ED Notes (Signed)
Called lab and spoke with Gwynn to see what delay on urine results are. Gwynn states that lab received specimen but results did not cross over due to computer issue. Should cross over in a few minutes

## 2019-10-03 NOTE — ED Notes (Signed)
Pt reports that her pain is some better after Toradol

## 2019-10-03 NOTE — ED Triage Notes (Signed)
Pt reports abd pain for the past week and a half. Pt reports saw her MD about it and was given an abx for a UTI but when the test came back her MD said it was negative for a UTI but that they saw a little blood in it. Pt reports pain is RLQ and it hurts worse when she walks up stairs.

## 2019-10-03 NOTE — ED Provider Notes (Signed)
[facility] Emergency Department Provider Note  ____________________________________________   First MD Initiated Contact with Patient 10/03/19 [phone]     (approximate)  I have reviewed the triage vital signs and the nursing notes.  History  Chief Complaint Abdominal Pain and Urinary Frequency    HPI Lori Oliver is a 48 y.o. female who presents to the ED for lower abdominal pain. She reports intermittent lower abdominal pain, R slightly greater than L that has been coming and going over the last week or so. She reports associated urinary frequency. Pain is moderate, and described as cramping. Comes and goes, worsened with movement, such as going up the stairs. Nothing makes it particularly better. She was initially seen by her MD with POC urine suspicious for possible infection (also noted microscopic blood per her report) and started on antibiotics but then follow up culture was negative. No fevers, nausea, vomiting, or flank pain. No dysuria or hematuria. No history of stones. No vaginal bleeding or discharge.    Past Medical Hx Past Medical History:  Diagnosis Date  . Abnormal thyroid function test 05/16/2016  . Anxiety   . Atrial fibrillation ([place])   . 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 Decreased libido 05/16/2016  . Dysrhythmia   . Hypertension   . Hypokalemia   . Impaired fasting glucose   . Morbid obesity ([place]) 06/01/2015  . Morbid obesity ([place])   . Plantar fasciitis     Problem List Patient Active Problem List   Diagnosis Date Noted  . Atrial tachycardia ([place]) 05/17/2019  . Chest tightness 05/17/2019  . Intermittent atrial fibrillation ([place]) 02/19/2018  . Hypokalemia 02/10/2018  . Atrial fibrillation with RVR ([place]) 12/01/2017  . Vitamin D deficiency 07/08/2017  . Lower back pain 07/07/2017  . Morbid obesity ([place]) 07/07/2017  . Essential hypertension 07/11/2016  . IFG (impaired fasting glucose) 05/16/2016  . Breast hypertrophy in  female 05/16/2016  . Abnormal thyroid function test 05/16/2016  . Decreased libido 05/16/2016  . Breast lump on left side at 5 o'clock position 05/16/2016  . Bilateral thoracic back pain 06/01/2015  . History of abnormal cervical Pap smear 06/01/2015    Past Surgical Hx Past Surgical History:  Procedure Laterality Date  . BREAST BIOPSY Right 2015   benign  . ESOPHAGOGASTRODUODENOSCOPY (EGD) WITH PROPOFOL N/A 08/12/2018   Procedure: ESOPHAGOGASTRODUODENOSCOPY (EGD) WITH PROPOFOL;  Surgeon: Tol[place], Benay Pike, MD;  Location: [facility] ENDOSCOPY;  Service: Gastroenterology;  Laterality: N/A;  . HAMMER TOE SURGERY      Medications Prior to Admission medications   Medication Sig Start Date End Date Taking? Authorizing Provider  diltiazem (CARDIZEM) 30 MG tablet Take 1 tablet (30 mg total) by mouth 3 (three) times daily as needed (for HR > 120). Patient not taking: Reported on 09/28/2019 03/14/19   Vaughan Basta, MD  hydrochlorothiazide (HYDRODIURIL) 12.5 MG tablet Take 1 tablet (12.5 mg total) by mouth daily. 09/17/19   Hubbard Hartshorn, FNP  hydrOXYzine (ATARAX/VISTARIL) 10 MG tablet Take 1 tablet (10 mg total) by mouth 3 (three) times daily as needed for anxiety (and stress). Patient not taking: Reported on 09/28/2019 08/27/19   Hubbard Hartshorn, FNP  nitrofurantoin, macrocrystal-monohydrate, (MACROBID) 100 MG capsule Take 1 capsule (100 mg total) by mouth 2 (two) times daily for 5 days. 09/28/19 10/03/19  Hubbard Hartshorn, FNP  pantoprazole (PROTONIX) 40 MG tablet Take 1 tablet (40 mg total) by mouth 2 (two) times daily for 30 days. 04/30/19 05/30/19  Marjean Donna  N, MD  propranolol (INDERAL) 20 MG tablet Take 1 tablet (20 mg total) by mouth 3 (three) times daily as needed (as needed for tachycardia). Patient not taking: Reported on 09/28/2019 06/03/19   Minna Merritts, MD  tiZANidine (ZANAFLEX) 4 MG tablet Take 1 tablet (4 mg total) by mouth every 8 (eight) hours as needed for muscle spasms.  Patient not taking: Reported on 09/28/2019 08/27/19   Hubbard Hartshorn, FNP    Allergies Patient has no known allergies.  Family Hx Family History  Problem Relation Age of Onset  . Hypertension Mother   . Heart disease Mother   . Arthritis Mother   . Depression Mother   . Stroke Maternal Grandmother     Social Hx Social History   Tobacco Use  . Smoking status: Never Smoker  . Smokeless tobacco: Never Used  Substance Use Topics  . Alcohol use: Yes    Alcohol/week: 0.0 standard drinks    Frequency: Never    Comment: occ  . Drug use: No     Review of Systems  Constitutional: Negative for fever, chills. Eyes: Negative for visual changes. ENT: Negative for sore throat. Cardiovascular: Negative for chest pain. Respiratory: Negative for shortness of breath. Gastrointestinal: Negative for nausea, vomiting. + lower abdominal cramping, urinary frequency Genitourinary: Negative for dysuria. Musculoskeletal: Negative for leg swelling. Skin: Negative for rash. Neurological: Negative for for headaches.   Physical Exam  Vital Signs: ED Triage Vitals [10/03/19 0825]  Enc Vitals Group     BP (!) 153/97     Pulse Rate 74     Resp 20     Temp 98.3 F (36.8 C)     Temp Source Oral     SpO2 98 %     Weight 263 lb (119.3 kg)     Height 5\' 6"  (1.676 m)     Head Circumference      Peak Flow      Pain Score 10     Pain Loc      Pain Edu?      Excl. in [place]?     Constitutional: Alert and oriented.  Head: Normocephalic. Atraumatic. Eyes: Conjunctivae clear. Sclera anicteric. Nose: No congestion. No rhinorrhea. Mouth/Throat: Mucous membranes are moist.  Neck: No stridor.   Cardiovascular: Normal rate, regular rhythm. Extremities well perfused. Respiratory: Normal respiratory effort.  Lungs CTAB. Gastrointestinal: Soft. Mild lower abdominal, suprapubic tenderness, w/o R or G. Remainder abdomen is NT. Non-distended.  Musculoskeletal: No lower extremity edema. No deformities.  Neurologic:  Normal speech and language. No gross focal neurologic deficits are appreciated.  Skin: Skin is warm, dry and intact. No rash noted. Psychiatric: Mood and affect are appropriate for situation.  EKG  N/A    Radiology  CT: IMPRESSION: 1. No evidence of nephrolithiasis or urinary obstruction. Normal noncontrast appearance of the urinary bladder given degree distention. 2. Enlarged myomatous uterus, incompletely evaluated on this noncontrast examination. 3. Small hiatal hernia.     Procedures  Procedure(s) performed (including critical care):  Procedures   Initial Impression / Assessment and Plan / ED Course  48 y.o. female who presents to the ED who presents for lower abdominal cramping, as above.   Ddx: UTI, nephrolithiasis, MSK etiology. Less likely appendicitis given intermittent nature, no fevers, nausea, vomiting or anorexia.   Plan: labs, urine, imaging, symptom control, reassess  Urine negative for infection. Remainder of labs w/o actionable derangements. CT negative for stone, normal appearing appendix. Does note myomatous uterus. Perhaps pain  could be related to degenerating fibroid vs MSK. She reports improvement after Toradol. As such, given reassuring work up, will plan for discharge with supportive care, and OB/PCP follow up. Patient agreeable. Given return precautions.    Final Clinical Impression(s) / ED Diagnosis  Final diagnoses:  Lower abdominal pain  Uterine leiomyoma, unspecified location       Note:  This document was prepared using Dragon voice recognition software and may include unintentional dictation errors.   Lilia Pro., MD 10/03/19 2117

## 2019-10-03 NOTE — ED Notes (Signed)
ED Provider at bedside. 

## 2019-10-05 ENCOUNTER — Other Ambulatory Visit: Payer: Self-pay

## 2019-10-05 ENCOUNTER — Ambulatory Visit (INDEPENDENT_AMBULATORY_CARE_PROVIDER_SITE_OTHER): Payer: 59 | Admitting: Obstetrics and Gynecology

## 2019-10-05 ENCOUNTER — Encounter: Payer: Self-pay | Admitting: Obstetrics and Gynecology

## 2019-10-05 VITALS — BP 150/86 | HR 71 | Ht 66.0 in | Wt 265.4 lb

## 2019-10-05 DIAGNOSIS — D259 Leiomyoma of uterus, unspecified: Secondary | ICD-10-CM | POA: Diagnosis not present

## 2019-10-05 DIAGNOSIS — I10 Essential (primary) hypertension: Secondary | ICD-10-CM | POA: Diagnosis not present

## 2019-10-05 DIAGNOSIS — N951 Menopausal and female climacteric states: Secondary | ICD-10-CM | POA: Diagnosis not present

## 2019-10-05 DIAGNOSIS — R102 Pelvic and perineal pain: Secondary | ICD-10-CM | POA: Diagnosis not present

## 2019-10-05 NOTE — Progress Notes (Signed)
Pt ED follow up for fibroids. Pt stated that her last cycles was over months but unsure. Pt stated that she is in a lot of pain.

## 2019-10-05 NOTE — Progress Notes (Signed)
GYNECOLOGY PROGRESS NOTE  Subjective:    Patient ID: Lori Oliver, female    DOB: Dec 28, 1970, 48 y.o.   MRN: [id number]  HPI  Patient is a 48 y.o. G1P1 female who presents for pelvic pain x 2 weeks. Thought it was a UTI, saw her PCP who prescribed her antibiotics, but then cultures were negative for a UTI.  Pain worsened last week and so she decided to go to the Emergency Room (visit on 10/03/2019) for further evaluation.  On CT scan it was noted that the patient had a fibroid and was instructed to see a Editor, commissioning.    Additionally, patient thinks that she may have gone through menopause since her last visit last year. Reports that she has not had a full period in ~ 1 year, but has had occasional spotting episodes.   The following portions of the patient's history were reviewed and updated as appropriate: allergies, current medications, past family history, past medical history, past social history, past surgical history and problem list.  Review of Systems Pertinent items noted in HPI and remainder of comprehensive ROS otherwise negative.   Objective:   Blood pressure (!) 150/86, pulse 71, height 5\' 6"  (1.676 m), weight 265 lb 6.4 oz (120.4 kg). General appearance: alert and no distress Abdomen: normal findings: bowel sounds normal, no masses palpable and soft and abnormal findings:  mild tenderness in the lower abdomen Pelvic: external genitalia normal, rectovaginal septum normal.  Vagina without discharge.  Cervix normal appearing, no lesions and no motion tenderness.  Difficult to assess uterine size due to body habitus and tenderness on exam.  Adnexae non-palpable, nontender bilaterally.  Extremities: extremities normal, atraumatic, no cyanosis or edema Neurologic: Grossly normal   Labs:  Admission on 10/03/2019, Discharged on 10/03/2019  Component Date Value Ref Range Status  . Color, Urine 10/03/2019 YELLOW* YELLOW Final  . APPearance 10/03/2019 HAZY* CLEAR Final  .  Specific Gravity, Urine 10/03/2019 1.006  1.005 - 1.030 Final  . pH 10/03/2019 7.0  5.0 - 8.0 Final  . Glucose, UA 10/03/2019 NEGATIVE  NEGATIVE mg/dL Final  . Hgb urine dipstick 10/03/2019 NEGATIVE  NEGATIVE Final  . Bilirubin Urine 10/03/2019 NEGATIVE  NEGATIVE Final  . Ketones, ur 10/03/2019 NEGATIVE  NEGATIVE mg/dL Final  . Protein, ur 10/03/2019 NEGATIVE  NEGATIVE mg/dL Final  . Nitrite 10/03/2019 NEGATIVE  NEGATIVE Final  . Leukocytes,Ua 10/03/2019 NEGATIVE  NEGATIVE Final  . WBC, UA 10/03/2019 0-5  0 - 5 WBC/hpf Final  . Bacteria, UA 10/03/2019 RARE* NONE SEEN Final  . Squamous Epithelial / LPF 10/03/2019 0-5  0 - 5 Final  . Mucus 10/03/2019 PRESENT   Final   Performed at [facility], [address] [place], [place] [address]  . Preg Test, Ur 10/03/2019 NEGATIVE  NEGATIVE Final   Performed at [facility], [address] [place], [place] [address]  . Sodium 10/03/2019 139  135 - 145 mmol/L Final  . Potassium 10/03/2019 4.0  3.5 - 5.1 mmol/L Final  . Chloride 10/03/2019 104  98 - 111 mmol/L Final  . CO2 10/03/2019 26  22 - 32 mmol/L Final  . Glucose, Bld 10/03/2019 107* 70 - 99 mg/dL Final  . BUN 10/03/2019 10  6 - 20 mg/dL Final  . Creatinine, Ser 10/03/2019 0.82  0.44 - 1.00 mg/dL Final  . Calcium 10/03/2019 8.9  8.9 - 10.3 mg/dL Final  . Total Protein 10/03/2019 7.7  6.5 - 8.1 g/dL Final  . Albumin 10/03/2019  3.8  3.5 - 5.0 g/dL Final  . AST 10/03/2019 15  15 - 41 U/L Final  . ALT 10/03/2019 13  0 - 44 U/L Final  . Alkaline Phosphatase 10/03/2019 67  38 - 126 U/L Final  . Total Bilirubin 10/03/2019 0.6  0.3 - 1.2 mg/dL Final  . GFR calc non Af Amer 10/03/2019 >60  >60 mL/min Final  . GFR calc Af Amer 10/03/2019 >60  >60 mL/min Final  . Anion gap 10/03/2019 9  5 - 15 Final   Performed at [facility], [address], [place], [place] [address]  . WBC 10/03/2019 6.7  4.0 - 10.5 K/uL Final  . RBC 10/03/2019 3.96  3.87 - 5.11 MIL/uL Final  .  Hemoglobin 10/03/2019 12.5  12.0 - 15.0 g/dL Final  . HCT 10/03/2019 37.4  36.0 - 46.0 % Final  . MCV 10/03/2019 94.4  80.0 - 100.0 fL Final  . MCH 10/03/2019 31.6  26.0 - 34.0 pg Final  . MCHC 10/03/2019 33.4  30.0 - 36.0 g/dL Final  . RDW 10/03/2019 12.4  11.5 - 15.5 % Final  . Platelets 10/03/2019 288  150 - 400 K/uL Final  . nRBC 10/03/2019 0.0  0.0 - 0.2 % Final  . Neutrophils Relative % 10/03/2019 54  % Final  . Neutro Abs 10/03/2019 3.6  1.7 - 7.7 K/uL Final  . Lymphocytes Relative 10/03/2019 37  % Final  . Lymphs Abs 10/03/2019 2.5  0.7 - 4.0 K/uL Final  . Monocytes Relative 10/03/2019 6  % Final  . Monocytes Absolute 10/03/2019 0.4  0.1 - 1.0 K/uL Final  . Eosinophils Relative 10/03/2019 3  % Final  . Eosinophils Absolute 10/03/2019 0.2  0.0 - 0.5 K/uL Final  . Basophils Relative 10/03/2019 0  % Final  . Basophils Absolute 10/03/2019 0.0  0.0 - 0.1 K/uL Final  . Immature Granulocytes 10/03/2019 0  % Final  . Abs Immature Granulocytes 10/03/2019 0.02  0.00 - 0.07 K/uL Final   Performed at [facility], [address], [place], [place] [address]    Imaging (10/03/2019):  CT Renal Stone Study CLINICAL DATA:  Abdominal pain for the past 1.5 weeks. Recent diagnosis of urinary tract infection.  EXAM: CT ABDOMEN AND PELVIS WITHOUT CONTRAST  TECHNIQUE: Multidetector CT imaging of the abdomen and pelvis was performed following the standard protocol without IV contrast.  COMPARISON:  None.  FINDINGS: The lack of intravenous contrast limits the ability to evaluate solid abdominal organs.  Lower chest: Limited visualization of the lower thorax demonstrates minimal ground-glass atelectasis within the imaged caudal aspect of the lingula. No pleural effusion.  Borderline cardiomegaly.  No pericardial effusion.  Hepatobiliary: Normal hepatic contour. No discrete hepatic lesions. Normal appearance of the gallbladder given degree distention. No radiopaque gallstones.  No ascites.  Pancreas: Normal noncontrast appearance of the pancreas.  Spleen: Normal noncontrast appearance of the spleen.  Adrenals/Urinary Tract: No renal stones. No renal stones are seen along the expected course of either ureter or the urinary bladder. Normal noncontrast appearance of the urinary bladder given degree distention. No urinary obstruction or perinephric stranding.  Normal noncontrast appearance the bilateral adrenal glands.  Stomach/Bowel: Small hiatal hernia. Moderate to large colonic stool burden without evidence of enteric obstruction. Normal noncontrast appearance of the terminal ileum and the retrocecal appendix. No pneumoperitoneum, pneumatosis or portal venous gas.  Vascular/Lymphatic: Normal caliber of the abdominal aorta.  No bulky retroperitoneal, mesenteric, pelvic or inguinal lymphadenopathy on this noncontrast examination.  Reproductive: The uterus is enlarged with a suspected 4.7 x 3.3 x 3.6 cm exophytic fibroid arising from the left side of the uterine fundus (axial image 50, series 2; sagittal image 118, series 6). No discrete adnexal lesion on this noncontrast examination. Multiple phleboliths are seen with the lower pelvis bilaterally. No free fluid the pelvic cul-de-sac.  Other: Small mesenteric fat containing periumbilical hernia. There is a minimal amount of subcutaneous edema about the midline of the low back.  Musculoskeletal: No acute or aggressive osseous abnormalities. Mild DDD of T10-T11 and T11-T12 with disc space height loss, endplate irregularity and sclerosis.  IMPRESSION: 1. No evidence of nephrolithiasis or urinary obstruction. Normal noncontrast appearance of the urinary bladder given degree distention. 2. Enlarged myomatous uterus, incompletely evaluated on this noncontrast examination. 3. Small hiatal hernia.  Electronically Signed   By: Sandi Mariscal M.D.   On: 10/03/2019 09:15    Assessment:   Pelvic pain  Fibroid uterus Perimenopausal Hypertension  Plan:   1. Pelvic pain - unclear cause. Patient with small exophytic fibroid, could possibly be causing some pain, however size not great enough to cause significant pain. Recent studies show no UTI or renal stones.  Patient notes OTC NSAIDs not helpful for pain. Offered trial of Tramadol, but patient declined as she was concerned for side effect of drowsiness.  Will prescribe Toradol instead.  2. Fibroid uterus, see above. Not large enough to deem surgical intervention necessary, however if pain persists or worsens, can consider. Will order ultrasound as CT scan notes uterus is enlarged but did not delineate size.  3. Perimenopausal - patient with no real cycles x 1 year, but several episodes of spotting.  Discussed possibility of perimenopause vs menopausal status. Will order labs. If menopausal, pain if due to fibroid will likely dissipate over time. If perimenopausal, may need to consider other options for management. Will notify patient of results by phone.  4. Elevated blood pressure noted today, patient with h/o HTN. Notes compliance with medications. May be secondary to pain. Continue to monitor.   Return to clinic for any scheduled appointments or for if symptoms worsen or fail to improve.     Rubie Maid, MD [facility]

## 2019-10-05 NOTE — Patient Instructions (Signed)

## 2019-10-06 ENCOUNTER — Ambulatory Visit (INDEPENDENT_AMBULATORY_CARE_PROVIDER_SITE_OTHER): Payer: 59

## 2019-10-06 DIAGNOSIS — R102 Pelvic and perineal pain: Secondary | ICD-10-CM | POA: Diagnosis not present

## 2019-10-06 DIAGNOSIS — D251 Intramural leiomyoma of uterus: Secondary | ICD-10-CM

## 2019-10-06 DIAGNOSIS — D259 Leiomyoma of uterus, unspecified: Secondary | ICD-10-CM

## 2019-10-06 LAB — PROGESTERONE: Progesterone: <0.1 ng/mL

## 2019-10-06 LAB — ESTRADIOL: Estradiol: 99.4 pg/mL

## 2019-10-06 LAB — FSH/LH
FSH: 5.6 m[IU]/mL
LH: 7.7 m[IU]/mL

## 2019-10-08 ENCOUNTER — Encounter: Payer: Self-pay | Admitting: Obstetrics and Gynecology

## 2019-10-08 MED ORDER — KETOROLAC TROMETHAMINE 10 MG PO TABS: 10.0000 mg | ORAL_TABLET | Freq: Four times a day (QID) | ORAL | 0 refills | Status: DC | PRN

## 2019-10-09 ENCOUNTER — Emergency Department: Admission: EM | Admit: 2019-10-09 | Discharge: 2019-10-09 | Discharge status: Against Medical Advice | Payer: 59

## 2019-10-12 ENCOUNTER — Telehealth: Payer: Self-pay | Admitting: Obstetrics and Gynecology

## 2019-10-12 NOTE — Telephone Encounter (Signed)
The patient called to check the status of her results. Pt is requesting a call back for an update. Please advise.

## 2019-10-14 NOTE — Progress Notes (Signed)
You read my GYN ultrasound

## 2019-10-18 NOTE — Telephone Encounter (Signed)
Please see mychart message.

## 2019-10-20 ENCOUNTER — Telehealth: Payer: Self-pay | Admitting: Obstetrics and Gynecology

## 2019-10-20 NOTE — Telephone Encounter (Signed)
The patient called and stated that she needs to speak with Dr. Marcelline Mates or her nurse in regards to her needing to speak with someone in regards to her test results. Pt stated that she sent a MyChart message on the 29 th.  Please review previous message. Please advise.

## 2019-10-20 NOTE — Telephone Encounter (Signed)
I responded to her message about her labs and ultrasound on 10/21. I'm assuming she did not get my message. See below:    Your hormone levels show that you are not yet in menopause, however your progesterone levels are very low (meaning that you are on your way to menopause fairly soon). Probably why you have not been having periods lately.

## 2019-10-21 NOTE — Telephone Encounter (Signed)
Spoke with pt concerning what her options are concerning being in pain all the time. Pt would like to do a televisit to speak with [facility] to answer questions and concerns that she has.

## 2019-10-25 NOTE — Telephone Encounter (Signed)
Will you please call the pt and schedule a televisit to discuss fibroid options? Thanks [employer]

## 2019-10-27 ENCOUNTER — Ambulatory Visit (INDEPENDENT_AMBULATORY_CARE_PROVIDER_SITE_OTHER): Payer: 59 | Admitting: Obstetrics and Gynecology

## 2019-10-27 ENCOUNTER — Encounter: Payer: Self-pay | Admitting: Obstetrics and Gynecology

## 2019-10-27 ENCOUNTER — Other Ambulatory Visit: Payer: Self-pay

## 2019-10-27 VITALS — Ht 66.0 in | Wt 264.0 lb

## 2019-10-27 DIAGNOSIS — N951 Menopausal and female climacteric states: Secondary | ICD-10-CM | POA: Diagnosis not present

## 2019-10-27 DIAGNOSIS — R102 Pelvic and perineal pain: Secondary | ICD-10-CM | POA: Diagnosis not present

## 2019-10-27 DIAGNOSIS — D251 Intramural leiomyoma of uterus: Secondary | ICD-10-CM

## 2019-10-27 NOTE — Progress Notes (Signed)
Pt televisit; pt was called and transferred from front desk to nurse. Pt having televisit due to wanting to discuss fibroid issues, treatment and options.

## 2019-10-27 NOTE — Progress Notes (Signed)
Virtual Visit via Telephone Note  I connected with Lori Oliver on 10/27/19 at  8:30 AM EST by telephone and verified that I am speaking with the correct person using two identifiers.   I discussed the limitations, risks, security and privacy concerns of performing an evaluation and management service by telephone and the availability of in person appointments. I also discussed with the patient that there may be a patient responsible charge related to this service. The patient expressed understanding and agreed to proceed.   History of Present Illness:  Lori Oliver is a 48 y.o. G1P1 who desires to discuss her lab/ultrasound results and further management of her fibroids and pelvic pain. She reports that her pain is getting somewhat better with use of the pain medications (Tramadol).   Observations/Objective: Height: 5\' 6"  (167.6 cm)  Weight: 264 lb (119.7 kg)     Labs:  Office Visit on 10/05/2019  Component Date Value Ref Range Status  . [place] 10/05/2019 7.7  mIU/mL Final   Comment:                     Adult Female:                       Follicular phase      2.4 -  12.6                       Ovulation phase      14.0 -  95.6                       Luteal phase          1.0 -  11.4                       Postmenopausal        7.7 -  58.5   . [facility] 10/05/2019 5.6  mIU/mL Final   Comment:                     Adult Female:                       Follicular phase      3.5 -  12.5                       Ovulation phase       4.7 -  21.5                       Luteal phase          1.7 -   7.7                       Postmenopausal       25.8 - 134.8   . Estradiol 10/05/2019 99.4  pg/mL Final   Comment:                     Adult Female:                       Follicular phase   [id number] -   166.0                       Ovulation phase    85.8 -  498.0                       Luteal phase       43.8 -   211.0                       Postmenopausal     <6.0 -    54.7                      Pregnancy                       1st trimester     215.0 - >4300.0 Roche ECLIA methodology   . Progesterone 10/05/2019 <0.1  ng/mL Final   Comment:                      Follicular phase       0.1 -   0.9                      Luteal phase           1.8 -  23.9                      Ovulation phase        0.1 -  12.0                      Pregnant                         First trimester    11.0 -  44.3                         Second trimester   25.4 -  83.3                         Third trimester    58.7 - 214.0                      Postmenopausal         0.0 -   0.1      Imaging:  US PELVIS (TRANSABDOMINAL ONLY) Patient Name: Lori Oliver DOB: 02-14-1971 MRN: [id number] ULTRASOUND REPORT  Location: [facility]  Date of Service: 10/06/2019   Indications:Enlarged Uterus Findings:  The uterus is anteverted and measures 12 x 7.9 x 9.0 cm Echo texture is heterogenous with evidence of focal masses. Within the uterus are multiple suspected fibroids measuring: Fibroid 1:Anterior  IM 3.8 x 3.0 x 3.4 cm Fibroid 2:Anterior Lt IM 2.5 x 3.8 x 3.4 cm Fibroid 3: Posterior IM 7.4 x 6.1 x 6.2 cm  The Endometrium measures 10 mm.  Right Ovary measures 2.7 x 1.4 x 2.7  cm. It is normal in appearance. Left Ovary measures 3.4 x 1.7 x 3.2 cm. It is normal in appearance. Survey of the adnexa demonstrates no adnexal masses. There is no free fluid in the cul de sac.  Impression: 1. Enlarge fibroid uterus as described above.  Recommendations: 1.Clinical correlation with the patient's History and Physical Exam.  Jenine M. Albertine Grates    RDMS  The ultrasound images and findings were reviewed by me and I agree with  the above report.  Finis Bud, M.D. 10/13/2019 4:03 PM  Assessment and Plan: 1. Perimenopausal - discussed lab results with patient. Noted that she is not yet menopausal, although she notes oligomenorrhea (may go up to 1 year without a cycle). Her progesterone is very low.   Can replace if cycles become abnormal or heavy.  2. Fibroid uterus - discussed enlarged fibroids, reviewed options of expectant management, medications to decrease size of fibroids and potentially reduce pain (Depot Lupron, Barbette Merino), or surgical management with hysterectomy. Patient desires to wait to see if pain resolves as it has been improving. If further management is pursued, will try [place].  2. Pelvic pain - currently improving with use of NSAIDs and Tramadol.     Follow Up Instructions: Patient to follow up if further management if desired.    I discussed the assessment and treatment plan with the patient. The patient was provided an opportunity to ask questions and all were answered. The patient agreed with the plan and demonstrated an understanding of the instructions.   The patient was advised to call back or seek an in-person evaluation if the symptoms worsen or if the condition fails to improve as anticipated.  I provided 14 minutes of non-face-to-face time during this encounter.   Rubie Maid, MD  [facility]

## 2019-10-28 ENCOUNTER — Encounter: Payer: Self-pay | Admitting: Obstetrics and Gynecology

## 2019-12-05 DIAGNOSIS — Z20828 Contact with and (suspected) exposure to other viral communicable diseases: Secondary | ICD-10-CM | POA: Diagnosis not present

## 2019-12-06 ENCOUNTER — Ambulatory Visit (INDEPENDENT_AMBULATORY_CARE_PROVIDER_SITE_OTHER): Payer: 59 | Admitting: Family Medicine

## 2019-12-06 ENCOUNTER — Other Ambulatory Visit (HOSPITAL_COMMUNITY)
Admission: RE | Admit: 2019-12-06 | Discharge: 2019-12-06 | Disposition: A | Discharge status: Home / Self Care | Payer: 59 | Source: Ambulatory Visit | Attending: Family Medicine | Admitting: Family Medicine

## 2019-12-06 ENCOUNTER — Other Ambulatory Visit: Payer: Self-pay

## 2019-12-06 ENCOUNTER — Encounter: Payer: Self-pay | Admitting: Family Medicine

## 2019-12-06 VITALS — BP 122/84 | HR 81 | Temp 97.5°F | Resp 16 | Ht 66.0 in | Wt 261.4 lb

## 2019-12-06 DIAGNOSIS — Z01419 Encounter for gynecological examination (general) (routine) without abnormal findings: Secondary | ICD-10-CM | POA: Insufficient documentation

## 2019-12-06 DIAGNOSIS — E559 Vitamin D deficiency, unspecified: Secondary | ICD-10-CM | POA: Diagnosis not present

## 2019-12-06 DIAGNOSIS — Z13220 Encounter for screening for lipoid disorders: Secondary | ICD-10-CM

## 2019-12-06 DIAGNOSIS — Z1211 Encounter for screening for malignant neoplasm of colon: Secondary | ICD-10-CM | POA: Diagnosis not present

## 2019-12-06 DIAGNOSIS — D259 Leiomyoma of uterus, unspecified: Secondary | ICD-10-CM | POA: Diagnosis not present

## 2019-12-06 DIAGNOSIS — R7301 Impaired fasting glucose: Secondary | ICD-10-CM

## 2019-12-06 DIAGNOSIS — I10 Essential (primary) hypertension: Secondary | ICD-10-CM

## 2019-12-06 DIAGNOSIS — Z6379 Other stressful life events affecting family and household: Secondary | ICD-10-CM

## 2019-12-06 DIAGNOSIS — F419 Anxiety disorder, unspecified: Secondary | ICD-10-CM

## 2019-12-06 DIAGNOSIS — I4891 Unspecified atrial fibrillation: Secondary | ICD-10-CM | POA: Diagnosis not present

## 2019-12-06 DIAGNOSIS — R69 Illness, unspecified: Secondary | ICD-10-CM | POA: Diagnosis not present

## 2019-12-06 NOTE — Progress Notes (Signed)
Name: Lori Oliver   MRN: [id number]    DOB: 1971/04/13   Date:12/06/2019       Progress Note  Subjective  Chief Complaint  Chief Complaint  Patient presents with  . Annual Exam  . Follow-up    HPI  Patient presents for annual CPE and follow up.  Uterine Fibroid: Seeing Dr. Marcelline Mates; initially not large enough for surgical intervention, however Dr. Marcelline Mates noted at their visit 10/27/2019 that the fibroids were enlarged and she reviewed surgical and/or medication managmeent options, Pt declined at that time as her pain had started to improve. Her pain has been well controlled.  Due for Pap today.  Anxiety/Stress: Her husband has been sick with cancer and is going through chemo at this time.  She has also had increased anxiety about COVID-19 pandemic.  She has EAP with free counseling with her job and she has been going to counseling.  She is sleeping okay; working 2 jobs, trying to be there for her husband.  She states her heart rate fluctuates, but no panic attacks.  She has not been taking hydroxyzine.   Afib RVR intermittent/HTN: She has been checking her HR and BP at home; notes HR has been in the 60's-70's, BP's have been WNL most of the time. She is seeing Dr. [name] for her Afib RVR and HTN.   She has been taking 12.86m HCTZ 2-3 times a week despite my removing the medication from her list last visit. She denies chest pain, shortness of breath. Has propanolol and diltiazem PRN per Dr. GRockey Situ  GERD: Taking Protonix very PRN and symptoms have been improved.  Pizza and other foods seem to be triggers.  No difficulty swallowing, abdominal pain, no blood in stool, dark and tarry stools.  [facility]. No changes.  Obesity: She is down 3lbs, she is has not been walking, diet has been poor.  She would like to explore weight loss surgery; also discussed medical weight management and/or referral to nutritionist.   USPSTF grade A and B  recommendations    Office Visit from 12/06/2019 in [facility] AUDIT-C Score  1    Drinks wine every now and then  Depression: Phq 9 is  negative [facility] 12/06/2019 09/28/2019 08/27/2019 02/22/2019 01/04/2019  Decreased Interest 0 1 0 1 0  Down, Depressed, Hopeless 0 0 0 1 1  PHQ - 2 Score 0 1 0 2 1  Altered sleeping 2 0 0 1 0  Tired, decreased energy 0 0 0 1 0  Change in appetite 0 0 0 0 0  Feeling bad or failure about yourself  0 0 0 1 1  Trouble concentrating 0 0 0 0 1  Moving slowly or fidgety/restless 0 0 0 0 0  Suicidal thoughts 0 0 0 0 0  PHQ-9 Score 2 1 0 5 3  Difficult doing work/chores Not difficult at all Somewhat difficult Not difficult at all Not difficult at all -  Some recent data might be hidden   Hypertension: BP Readings from Last 3 Encounters:  12/06/19 122/84  10/05/19 (!) 150/86  10/03/19 138/80   Obesity: Wt Readings from Last 3 Encounters:  12/06/19 261 lb 6.4 oz (118.6 kg)  10/27/19 264 lb (119.7 kg)  10/05/19 265 lb 6.4 oz (120.4 kg)   BMI Readings from Last 3 Encounters:  12/06/19 42.19 kg/m  10/27/19 42.61 kg/m  10/05/19 42.84 kg/m  Hep C Screening: Declines STD testing and prevention (HIV/chl/gon/syphilis): Declines all STI screening Intimate partner violence: No concerns Sexual History (Partners/Practices/Protection from STI/Past hx STI/Pregnancy Plans): Not sexually active - husband is going through cancer treatment Pain during Intercourse: N/A Menstrual History/LMP/Abnormal Bleeding: Seeing Dr. Marcelline Mates Incontinence Symptoms: No concerns  Breast cancer:  - Last Mammogram: Scheduled January 2021 - BRCA gene screening: No family history  Osteoporosis: Discussed high calcium and vitamin D supplementation, weight bearing exercises.   Cervical cancer screening: Due for pap today  Skin cancer: Discussed monitoring for atypical lesions; no concerning lesions. Colorectal cancer: Denies family or  personal history of colorectal cancer, no changes in BM's - no blood in stool, dark and tarry stool, mucus in stool, or constipation/diarrhea. Lung cancer:  Never Smoker Low Dose CT Chest recommended if Age 57-80 years, 30 pack-year currently smoking OR have quit w/in 15years. Patient does not qualify.   ECG: On file; seeing cardiology  Advanced Care Planning: A voluntary discussion about advance care planning including the explanation and discussion of advance directives.  Discussed health care proxy and Living will, and the patient was able to identify a health care proxy as Husband Glade Nurse).  Patient does not have a living will at present time. If patient does have living will, I have requested they bring this to the clinic to be scanned in to their chart.  Lipids: Lab Results  Component Value Date   CHOL 162 12/14/2018   CHOL 177 07/07/2017   CHOL 167 05/17/2016   Lab Results  Component Value Date   HDL 46 (L) 12/14/2018   HDL 49 07/07/2017   HDL 68 05/17/2016   Lab Results  Component Value Date   LDLCALC 100 (H) 12/14/2018   LDLCALC 101 (H) 07/07/2017   LDLCALC 83 05/17/2016   Lab Results  Component Value Date   TRIG 69 12/14/2018   TRIG 134 07/07/2017   TRIG 82 05/17/2016   Lab Results  Component Value Date   CHOLHDL 3.5 12/14/2018   CHOLHDL 3.6 07/07/2017   No results found for: LDLDIRECT  Glucose: Glucose  Date Value Ref Range Status  03/09/2014 86 65 - 99 mg/dL Final   Glucose, Bld  Date Value Ref Range Status  10/03/2019 107 (H) 70 - 99 mg/dL Final  08/23/2019 124 (H) 70 - 99 mg/dL Final  04/30/2019 129 (H) 70 - 99 mg/dL Final   Glucose-Capillary  Date Value Ref Range Status  03/14/2019 116 (H) 70 - 99 mg/dL Final  12/01/2017 126 (H) 65 - 99 mg/dL Final    Patient Active Problem List   Diagnosis Date Noted  . Atrial tachycardia ([place]) 05/17/2019  . Chest tightness 05/17/2019  . Intermittent atrial fibrillation ([place]) 02/19/2018  . Hypokalemia  02/10/2018  . Atrial fibrillation with RVR ([place]) 12/01/2017  . Vitamin D deficiency 07/08/2017  . Lower back pain 07/07/2017  . Morbid obesity ([place]) 07/07/2017  . Essential hypertension 07/11/2016  . IFG (impaired fasting glucose) 05/16/2016  . 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 Abnormal thyroid function test 05/16/2016  . Decreased libido 05/16/2016  . Breast lump on left side at 5 o'clock position 05/16/2016  . Bilateral thoracic back pain 06/01/2015  . History of abnormal cervical Pap smear 06/01/2015    Past Surgical History:  Procedure Laterality Date  . BREAST BIOPSY Right 2015   benign  . ESOPHAGOGASTRODUODENOSCOPY (EGD) WITH PROPOFOL N/A 08/12/2018   Procedure: ESOPHAGOGASTRODUODENOSCOPY (EGD) WITH PROPOFOL;  Surgeon: Alice Reichert, Benay Pike, MD;  Location:  [facility] ENDOSCOPY;  Service: Gastroenterology;  Laterality: N/A;  . HAMMER TOE SURGERY      Family History  Problem Relation Age of Onset  . Hypertension Mother   . Heart disease Mother   . Arthritis Mother   . Depression Mother   . Stroke Maternal Grandmother     Social History   Socioeconomic History  . Marital status: Married    Spouse name: Coralyn Mark  . Number of children: 1  . Years of education: 51  . Highest education level: Some college, no degree  Occupational History  . Not on file  Tobacco Use  . Smoking status: Never Smoker  . Smokeless tobacco: Never Used  Substance and Sexual Activity  . Alcohol use: Yes    Alcohol/week: 0.0 standard drinks    Comment: occ  . Drug use: No  . Sexual activity: Not Currently    Partners: Male    Birth control/protection: None  Other Topics Concern  . Not on file  Social History Narrative   Husband was diagnosed with Cancer this past Summer.   Social Determinants of Health   Financial Resource Strain:   . Difficulty of Paying Living Expenses: Not on file  Food Insecurity:   . Worried About Charity fundraiser in the Last Year: Not on file  . Ran Out of  Food in the Last Year: Not on file  Transportation Needs:   . Lack of Transportation (Medical): Not on file  . Lack of Transportation (Non-Medical): Not on file  Physical Activity:   . Days of Exercise per Week: Not on file  . Minutes of Exercise per Session: Not on file  Stress:   . Feeling of Stress : Not on file  Social Connections:   . Frequency of Communication with Friends and Family: Not on file  . Frequency of Social Gatherings with Friends and Family: Not on file  . Attends Religious Services: Not on file  . Active Member of Clubs or Organizations: Not on file  . Attends Archivist Meetings: Not on file  . Marital Status: Not on file  Intimate Partner Violence:   . Fear of Current or Ex-Partner: Not on file  . Emotionally Abused: Not on file  . Physically Abused: Not on file  . Sexually Abused: Not on file     Current Outpatient Medications:  .  hydrochlorothiazide (HYDRODIURIL) 12.5 MG tablet, Take 1 tablet (12.5 mg total) by mouth daily., Disp: 90 tablet, Rfl: 0 .  hydrOXYzine (ATARAX/VISTARIL) 10 MG tablet, Take 1 tablet (10 mg total) by mouth 3 (three) times daily as needed for anxiety (and stress)., Disp: 30 tablet, Rfl: 1 .  ketorolac (TORADOL) 10 MG tablet, Take 1 tablet (10 mg total) by mouth every 6 (six) hours as needed., Disp: 20 tablet, Rfl: 0 .  propranolol (INDERAL) 20 MG tablet, Take 1 tablet (20 mg total) by mouth 3 (three) times daily as needed (as needed for tachycardia)., Disp: 90 tablet, Rfl: 3 .  diltiazem (CARDIZEM) 30 MG tablet, Take 1 tablet (30 mg total) by mouth 3 (three) times daily as needed (for HR > 120). (Patient not taking: Reported on 09/28/2019), Disp: 30 tablet, Rfl: 0 .  pantoprazole (PROTONIX) 40 MG tablet, Take 1 tablet (40 mg total) by mouth 2 (two) times daily for 30 days., Disp: 60 tablet, Rfl: 0 .  tiZANidine (ZANAFLEX) 4 MG tablet, Take 1 tablet (4 mg total) by mouth every 8 (eight) hours as needed for muscle spasms.  (  Patient not taking: Reported on 09/28/2019), Disp: 30 tablet, Rfl: 0  No Known Allergies   ROS  Constitutional: Negative for fever or weight change.  Respiratory: Negative for cough and shortness of breath.   Cardiovascular: Negative for chest pain; occasional palpitations.  Gastrointestinal: Negative for abdominal pain, no bowel changes.  Musculoskeletal: Negative for gait problem or joint swelling.  Skin: Negative for rash.  Neurological: Negative for dizziness or headache.  No other specific complaints in a complete review of systems (except as listed in HPI above).  Objective  Vitals:   12/06/19 1354  BP: 122/84  Pulse: 81  Resp: 16  Temp: (!) 97.5 F (36.4 C)  TempSrc: Temporal  SpO2: 99%  Weight: 261 lb 6.4 oz (118.6 kg)  Height: '5\' 6"'  (1.676 m)    Body mass index is 42.19 kg/m.  Physical Exam  Constitutional: Patient appears well-developed and well-nourished. No distress.  HENT: Head: Normocephalic and atraumatic. Ears: B TMs ok, no erythema or effusion; Nose: Nose normal. Mouth/Throat: Oropharynx is clear and moist. No oropharyngeal exudate.  Eyes: Conjunctivae and EOM are normal. Pupils are equal, round, and reactive to light. No scleral icterus.  Neck: Normal range of motion. Neck supple. No JVD present. No thyromegaly present.  Cardiovascular: Normal rate, regular rhythm and normal heart sounds.  No murmur heard. No BLE edema. Pulmonary/Chest: Effort normal and breath sounds normal. No respiratory distress. Abdominal: Soft. Bowel sounds are normal, no distension. There is no tenderness. no masses Breast: no lumps or masses, no nipple discharge or rashes FEMALE GENITALIA:  External genitalia normal External urethra normal Vaginal vault normal without discharge or lesions Cervix difficulty to visualize cervical os - blind sweep is performed. Bimanual exam normal without masses RECTAL: no rectal masses or hemorrhoids Musculoskeletal: Normal range of motion,  no joint effusions. No gross deformities Neurological: he is alert and oriented to person, place, and time. No cranial nerve deficit. Coordination, balance, strength, speech and gait are normal.  Skin: Skin is warm and dry. No rash noted. No erythema.  Psychiatric: Patient has a normal mood and affect. behavior is normal. Judgment and thought content normal.  Recent Results (from the past 2160 hour(s))  Novel Coronavirus, NAA ([facility])     Status: None   Collection Time: 09/13/19  9:02 AM   Specimen: Oropharyngeal(OP) collection in vial transport medium   OROPHARYNGEA  TESTING  Result Value Ref Range   SARS-CoV-2, NAA Not Detected Not Detected    Comment: This nucleic acid amplification test was developed and its performance characteristics determined by [employer]. Nucleic acid amplification tests include PCR and TMA. This test has not been FDA cleared or approved. This test has been authorized by FDA under an Emergency Use Authorization (EUA). This test is only authorized for the duration of time the declaration that circumstances exist justifying the authorization of the emergency use of in vitro diagnostic tests for detection of SARS-CoV-2 virus and/or diagnosis of COVID-19 infection under section 564(b)(1) of the Act, 21 U.S.C. 579UXY-3(F) (1), unless the authorization is terminated or revoked sooner. When diagnostic testing is negative, the possibility of a false negative result should be considered in the context of a patient's recent exposures and the presence of clinical signs and symptoms consistent with COVID-19. An individual without symptoms of COVID-19 and who is not shedding SARS-CoV-2 virus would  expect to have a negative (not detected) result in this assay.   Urine Culture     Status: None   Collection Time: 09/28/19  12:00 AM   Specimen: Urine  Result Value Ref Range   MICRO NUMBER: [id number]    SPECIMEN QUALITY: Adequate    Sample Source URINE, CLEAN  CATCH    STATUS: FINAL    ISOLATE 1:      Less than 10,000 CFU/mL of single Gram positive organism isolated. No further testing will be performed. If clinically indicated, recollection using a method to minimize contamination, with prompt transfer to Urine Culture Transport Tube, is recommended.  POCT urinalysis dipstick     Status: None   Collection Time: 09/28/19  2:18 PM  Result Value Ref Range   Color, UA yellow    Clarity, UA clear    Glucose, UA Negative Negative   Bilirubin, UA negative    Ketones, UA negative    Spec Grav, UA 1.015 1.010 - 1.025   Blood, UA trace    pH, UA 5.0 5.0 - 8.0   Protein, UA Negative Negative   Urobilinogen, UA 0.2 0.2 or 1.0 E.U./dL   Nitrite, UA negative    Leukocytes, UA Negative Negative   Appearance clear    Odor none   Urinalysis, Complete w Microscopic     Status: Abnormal   Collection Time: 10/03/19  8:37 AM  Result Value Ref Range   Color, Urine YELLOW (A) YELLOW   APPearance HAZY (A) CLEAR   Specific Gravity, Urine 1.006 1.005 - 1.030   pH 7.0 5.0 - 8.0   Glucose, UA NEGATIVE NEGATIVE mg/dL   Hgb urine dipstick NEGATIVE NEGATIVE   Bilirubin Urine NEGATIVE NEGATIVE   Ketones, ur NEGATIVE NEGATIVE mg/dL   Protein, ur NEGATIVE NEGATIVE mg/dL   Nitrite NEGATIVE NEGATIVE   Leukocytes,Ua NEGATIVE NEGATIVE   WBC, UA 0-5 0 - 5 WBC/hpf   Bacteria, UA RARE (A) NONE SEEN   Squamous Epithelial / LPF 0-5 0 - 5   Mucus PRESENT     Comment: Performed at [facility], [address] [place], [place] [address]  Pregnancy, urine     Status: None   Collection Time: 10/03/19  8:37 AM  Result Value Ref Range   Preg Test, Ur NEGATIVE NEGATIVE    Comment: Performed at [facility], [address] [place], [place] [address]  Comprehensive metabolic panel     Status: Abnormal   Collection Time: 10/03/19  8:37 AM  Result Value Ref Range   Sodium 139 135 - 145 mmol/L   Potassium 4.0 3.5 - 5.1 mmol/L   Chloride 104 98 - 111  mmol/L   CO2 26 22 - 32 mmol/L   Glucose, Bld 107 (H) 70 - 99 mg/dL   BUN 10 6 - 20 mg/dL   Creatinine, Ser 0.82 0.44 - 1.00 mg/dL   Calcium 8.9 8.9 - 10.3 mg/dL   Total Protein 7.7 6.5 - 8.1 g/dL   Albumin 3.8 3.5 - 5.0 g/dL   AST 15 15 - 41 U/L   ALT 13 0 - 44 U/L   Alkaline Phosphatase 67 38 - 126 U/L   Total Bilirubin 0.6 0.3 - 1.2 mg/dL   GFR calc non Af Amer >60 >60 mL/min   GFR calc Af Amer >60 >60 mL/min   Anion gap 9 5 - 15    Comment: Performed at [facility], [address] [place], [place] [address]  CBC with Differential     Status: None   Collection Time: 10/03/19  8:37 AM  Result Value Ref Range   WBC 6.7 4.0 - 10.5  K/uL   RBC 3.96 3.87 - 5.11 MIL/uL   Hemoglobin 12.5 12.0 - 15.0 g/dL   HCT 37.4 36.0 - 46.0 %   MCV 94.4 80.0 - 100.0 fL   MCH 31.6 26.0 - 34.0 pg   MCHC 33.4 30.0 - 36.0 g/dL   RDW 12.4 11.5 - 15.5 %   Platelets 288 150 - 400 K/uL   nRBC 0.0 0.0 - 0.2 %   Neutrophils Relative % 54 %   Neutro Abs 3.6 1.7 - 7.7 K/uL   Lymphocytes Relative 37 %   Lymphs Abs 2.5 0.7 - 4.0 K/uL   Monocytes Relative 6 %   Monocytes Absolute 0.4 0.1 - 1.0 K/uL   Eosinophils Relative 3 %   Eosinophils Absolute 0.2 0.0 - 0.5 K/uL   Basophils Relative 0 %   Basophils Absolute 0.0 0.0 - 0.1 K/uL   Immature Granulocytes 0 %   Abs Immature Granulocytes 0.02 0.00 - 0.07 K/uL    Comment: Performed at [facility], [address] [place], [place] [address]  FSH/LH     Status: None   Collection Time: 10/05/19 12:23 PM  Result Value Ref Range   LH 7.7 mIU/mL    Comment:                     Adult Female:                       Follicular phase      2.4 -  12.6                       Ovulation phase      14.0 -  95.6                       Luteal phase          1.0 -  11.4                       Postmenopausal        7.7 -  58.5    FSH 5.6 mIU/mL    Comment:                     Adult Female:                       Follicular phase      3.5 -  12.5                        Ovulation phase       4.7 -  21.5                       Luteal phase          1.7 -   7.7                       Postmenopausal       25.8 - 134.8   Estradiol     Status: None   Collection Time: 10/05/19 12:23 PM  Result Value Ref Range   Estradiol 99.4 pg/mL    Comment:                     Adult Female:  Follicular phase   65.5 -   166.0                       Ovulation phase    85.8 -   498.0                       Luteal phase       43.8 -   211.0                       Postmenopausal     <6.0 -    54.7                     Pregnancy                       1st trimester     215.0 - >4300.0 Roche ECLIA methodology   Progesterone     Status: None   Collection Time: 10/05/19 12:23 PM  Result Value Ref Range   Progesterone <0.1 ng/mL    Comment:                      Follicular phase       0.1 -   0.9                      Luteal phase           1.8 -  23.9                      Ovulation phase        0.1 -  12.0                      Pregnant                         First trimester    11.0 -  44.3                         Second trimester   25.4 -  83.3                         Third trimester    58.7 - 214.0                      Postmenopausal         0.0 -   0.1     Fall Risk: Fall Risk  12/06/2019 09/28/2019 08/27/2019 02/22/2019 01/04/2019  Falls in the past year? 0 0 0 0 1  Number falls in past yr: 0 0 0 - 0  Injury with Fall? 0 0 0 - 1  Comment - - - - -  Follow up Falls evaluation completed - Falls evaluation completed - -    Assessment & Plan  1. Well woman exam -USPSTF grade A and B recommendations reviewed with patient; age-appropriate recommendations, preventive care, screening tests, etc discussed and encouraged; healthy living encouraged; see AVS for patient education given to patient -Discussed importance of 150 minutes of physical activity weekly, eat two servings of fish weekly, eat one serving of tree nuts ( cashews, pistachios,  pecans, almonds.Marland Kitchen) every other day, eat 6 servings of fruit/vegetables daily and drink plenty of water and  avoid sweet beverages.  - Cytology - PAP  2. Uterine leiomyoma, unspecified location - Follow up with Dr. Marcelline Mates  3. Anxiety - Has hydroxyzine PRN; still does not want daily medication.  Doing counseling.  4. Stress due to illness of family member - Husband is still going through Chemo; she is seeking counseling.  5. Essential hypertension - Advised to stop HCTZ and only take propanolol and diltiazem PRN - COMPLETE METABOLIC PANEL WITH GFR  6. Atrial fibrillation with RVR ([place]) - Annual follow up with Dr. [name]; PRN medications propanolol and dilt  7. Colon cancer screening - Ambulatory referral to Gastroenterology  8. IFG (impaired fasting glucose) - COMPLETE METABOLIC PANEL WITH GFR - Hemoglobin A1c  9. Vitamin D deficiency - VITAMIN D 25 Hydroxy (Vit-D Deficiency, Fractures)  10. Lipid screening - Lipid panel

## 2019-12-06 NOTE — Patient Instructions (Addendum)
[facility]: [email or web address]  BP Goal is <150/90 - if above this, please call our office.  Look up Lexapro to see if you would like to take this for your Anxiety.

## 2019-12-07 ENCOUNTER — Other Ambulatory Visit: Payer: 59

## 2019-12-07 LAB — HEMOGLOBIN A1C
Hgb A1c MFr Bld: 5.5 % of total Hgb (ref <5.7)
Mean Plasma Glucose: 111 (calc)
eAG (mmol/L): 6.2 (calc)

## 2019-12-07 LAB — COMPLETE METABOLIC PANEL WITH GFR
AG Ratio: 1.3 (calc) (ref 1.0–2.5)
ALT: 9 U/L (ref 6–29)
AST: 12 U/L (ref 10–35)
Albumin: 4 g/dL (ref 3.6–5.1)
Alkaline phosphatase (APISO): 66 U/L (ref 31–125)
BUN: 11 mg/dL (ref 7–25)
CO2: 29 mmol/L (ref 20–32)
Calcium: 9.2 mg/dL (ref 8.6–10.2)
Chloride: 104 mmol/L (ref 98–110)
Creat: 0.82 mg/dL (ref 0.50–1.10)
GFR, Est African American: 99 mL/min/{1.73_m2} (ref >=60)
GFR, Est Non African American: 85 mL/min/{1.73_m2} (ref >=60)
Globulin: 3.1 g/dL (calc) (ref 1.9–3.7)
Glucose, Bld: 65 mg/dL (ref 65–99)
Potassium: 4.2 mmol/L (ref 3.5–5.3)
Sodium: 139 mmol/L (ref 135–146)
Total Bilirubin: 0.3 mg/dL (ref 0.2–1.2)
Total Protein: 7.1 g/dL (ref 6.1–8.1)

## 2019-12-07 LAB — LIPID PANEL
Cholesterol: 164 mg/dL (ref <200)
HDL: 47 mg/dL — ABNORMAL LOW (ref >=50)
LDL Cholesterol (Calc): 97 mg/dL (calc)
Non-HDL Cholesterol (Calc): 117 mg/dL (calc) (ref <130)
Total CHOL/HDL Ratio: 3.5 (calc) (ref <5.0)
Triglycerides: 108 mg/dL (ref <150)

## 2019-12-07 LAB — VITAMIN D 25 HYDROXY (VIT D DEFICIENCY, FRACTURES): Vit D, 25-Hydroxy: 11 ng/mL — ABNORMAL LOW (ref 30–100)

## 2019-12-08 ENCOUNTER — Other Ambulatory Visit: Payer: Self-pay | Admitting: Family Medicine

## 2019-12-08 DIAGNOSIS — E559 Vitamin D deficiency, unspecified: Secondary | ICD-10-CM

## 2019-12-08 LAB — CYTOLOGY - PAP
Comment: NEGATIVE
Diagnosis: NEGATIVE
High risk HPV: NEGATIVE

## 2019-12-08 MED ORDER — VITAMIN D (ERGOCALCIFEROL) 1.25 MG (50000 UNIT) PO CAPS: 50000.0000 [IU] | ORAL_CAPSULE | ORAL | 0 refills | Status: DC

## 2019-12-24 ENCOUNTER — Ambulatory Visit
Admission: RE | Admit: 2019-12-24 | Discharge: 2019-12-24 | Disposition: A | Discharge status: Home / Self Care | Payer: 59 | Source: Ambulatory Visit | Attending: Family Medicine | Admitting: Family Medicine

## 2019-12-24 ENCOUNTER — Encounter: Payer: Self-pay | Admitting: *Deleted

## 2019-12-24 DIAGNOSIS — Z1231 Encounter for screening mammogram for malignant neoplasm of breast: Secondary | ICD-10-CM

## 2019-12-25 DIAGNOSIS — J0100 Acute maxillary sinusitis, unspecified: Secondary | ICD-10-CM | POA: Diagnosis not present

## 2020-01-24 ENCOUNTER — Ambulatory Visit: Payer: 59 | Admitting: Family Medicine

## 2020-03-15 DIAGNOSIS — L300 Nummular dermatitis: Secondary | ICD-10-CM | POA: Diagnosis not present

## 2020-03-15 DIAGNOSIS — D239 Other benign neoplasm of skin, unspecified: Secondary | ICD-10-CM | POA: Diagnosis not present

## 2020-03-15 DIAGNOSIS — L720 Epidermal cyst: Secondary | ICD-10-CM | POA: Diagnosis not present

## 2020-04-07 DIAGNOSIS — S99921A Unspecified injury of right foot, initial encounter: Secondary | ICD-10-CM | POA: Diagnosis not present

## 2020-04-07 DIAGNOSIS — S99911A Unspecified injury of right ankle, initial encounter: Secondary | ICD-10-CM | POA: Diagnosis not present

## 2020-04-07 DIAGNOSIS — M25571 Pain in right ankle and joints of right foot: Secondary | ICD-10-CM | POA: Diagnosis not present

## 2020-04-20 ENCOUNTER — Telehealth: Payer: Self-pay | Admitting: Family Medicine

## 2020-04-20 NOTE — Telephone Encounter (Signed)
Attempted to call pt.  Left vm to return call to the office to clarify which medication she needs refilled.

## 2020-04-20 NOTE — Telephone Encounter (Signed)
Pt called stating that she is needing her BP medication refilled. Pt is requesting to have it in a pill form instead of the capsule if possible. Please advise.      [place], [place] [facility] [address]  [place] [address]  Phone: [phone] Fax: [phone]  Not a 24 hour pharmacy; exact hours not known.

## 2020-04-20 NOTE — Telephone Encounter (Signed)
Per initial encounter, "Pt called stating that she is needing her BP medication refilled. Pt is requesting to have it in a pill form instead of the capsule if possible. Please advise."      [facility] [address] [place]  [place] [address]  Phone: [phone] Fax: [phone]  Not a 24 hour pharmacy; exact hours not known.   Contacted pt to clarify refill request; the pt says that she would like a refill on HCTZ 12.5 mg; explained to pt that this is not on her current lists of medication, and per Raquel Sarna Boyce's note dated 12/05/2019, this medication was d/c'd due to change in therapy; she states the medication was restarted per Raelyn Ensign; she would also like to have the medication to be changed from pill form instead of capsule; will route to office for final disposition.  Requested medication (s) are due for refill today:no  Requested medication (s) are on the active medication list: No  Last refill:  ?  Future visit scheduled: yes, 05/12/20  Notes to clinic: not on med list; d/c'd 12/05/2019

## 2020-04-21 NOTE — Telephone Encounter (Signed)
appt scheduled with Dr Roxan Hockey for Monday

## 2020-04-21 NOTE — Telephone Encounter (Signed)
Patient need appointment, last seen Raquel Sarna and it has been 5 months. Maybe phone, virtual or come in

## 2020-04-21 NOTE — Progress Notes (Signed)
Name: Lori Oliver   MRN: [id number]    DOB: 06/07/1971   Date:04/24/2020       Progress Note  Subjective  Chief Complaint  Chief Complaint  Patient presents with  . Follow-up  . Hypertension  . Gastroesophageal Reflux  . Anxiety    I connected with  [name]  on 04/24/20 at 10:00 AM EDT by a video enabled telemedicine application and verified that I am speaking with the correct person using two identifiers.  I discussed the limitations of evaluation and management by telemedicine and the availability of in person appointments. The patient expressed understanding and agreed to proceed. Staff also discussed with the patient that there may be a patient responsible charge related to this service. Patient Location: Home Provider Location: [facility] Additional Individuals present: none I was having technical difficulty with the video visit, and telephone the patient and she agreed to continue the visit by phone  HPI Patient is a 49 year old female patient of Raelyn Ensign Her last visit was for an annual exam in 11/2019, a pelvic exam and Pap was done at that time. Also a referral to gastroenterology was placed for colon cancer screening She has been followed more recently by Dr. Duanne Moron for a foot and ankle injury.  She noted today she would like to get a refill of her hydrochlorothiazide medicine  Afib RVR intermittent/HTN: She has been checking her HR and BP daily at home; BP's have been WNL most of the time, but she does note occasionally gets a higher reading, mainly the bottom number.  She notes over the last couple months, about 3-4 times she has had a bottom number in the 90s, one time was 102.  When her blood pressure is higher, she then takes the hydrochlorothiazide product.  She does not take it every day, takes it about 2-3 times a week when asked how often. Her blood pressure this morning was 123/87. BP Readings from Last 3 Encounters:  04/24/20 123/87  12/06/19 122/84   10/05/19 (!) 150/86   The patient know that Raquel Sarna had told her to stop the medicine at one point as her blood pressure seemed to be well controlled, although then the patient called Raquel Sarna and informed her of some of these higher blood pressure readings at home, and it was restarted.  She has been taking it more like an as needed medicine recently.  She is seeing Dr. [name] from cardiology for her Afib RVR and HTN, and in his last note, he noted LVH on the echocardiogram, and the importance of keeping her blood pressure very well controlled.  I did share this with the patient this morning. Has propanolol and diltiazem PRN per Dr. [name] to use as needed and has not needed in recent past. No racing heart issues, no CP, no shortness of breath, no LE edema. Last labs were done in December 2020, and those were reviewed, with her comp panel all normal.   Anxiety/Stress: Her husband had been sick with cancer and she has also had increased anxiety about COVID-19 pandemic noted prior. She noted today that her anxiety is significantly improved in the recent past.  She is also follow-up for GERD, Obesity, uterine fibroid (seeing OB/GYN), prior prediabetes (last check okay), vitamin D deficiency, and mild hyperlipidemia (low HDL)          Patient Active Problem List   Diagnosis Date Noted  . Atrial tachycardia ([place]) 05/17/2019  . Chest tightness 05/17/2019  . Intermittent  atrial fibrillation ([place]) 02/19/2018  . Hypokalemia 02/10/2018  . Atrial fibrillation with RVR ([place]) 12/01/2017  . Vitamin D deficiency 07/08/2017  . Lower back pain 07/07/2017  . Morbid obesity ([place]) 07/07/2017  . Essential hypertension 07/11/2016  . IFG (impaired fasting glucose) 05/16/2016  . 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 Abnormal thyroid function test 05/16/2016  . Decreased libido 05/16/2016  . Breast lump on left side at 5 o'clock position 05/16/2016  . Bilateral thoracic back pain 06/01/2015  . History  of abnormal cervical Pap smear 06/01/2015    Past Surgical History:  Procedure Laterality Date  . BREAST BIOPSY Right 2015   benign  . ESOPHAGOGASTRODUODENOSCOPY (EGD) WITH PROPOFOL N/A 08/12/2018   Procedure: ESOPHAGOGASTRODUODENOSCOPY (EGD) WITH PROPOFOL;  Surgeon: Toledo, Benay Pike, MD;  Location: [facility] ENDOSCOPY;  Service: Gastroenterology;  Laterality: N/A;  . HAMMER TOE SURGERY      Family History  Problem Relation Age of Onset  . Hypertension Mother   . Heart disease Mother   . Arthritis Mother   . Depression Mother   . Stroke Maternal Grandmother     Social History   Tobacco Use  . Smoking status: Never Smoker  . Smokeless tobacco: Never Used  Substance Use Topics  . Alcohol use: Yes    Alcohol/week: 0.0 standard drinks    Comment: occ     Current Outpatient Medications:  .  diltiazem (CARDIZEM) 30 MG tablet, Take 1 tablet (30 mg total) by mouth 3 (three) times daily as needed (for HR > 120)., Disp: 30 tablet, Rfl: 0 .  hydrochlorothiazide (MICROZIDE) 12.5 MG capsule, Take 12.5 mg by mouth daily., Disp: , Rfl:  .  hydrOXYzine (ATARAX/VISTARIL) 10 MG tablet, Take 1 tablet (10 mg total) by mouth 3 (three) times daily as needed for anxiety (and stress). (Patient not taking: Reported on 04/24/2020), Disp: 30 tablet, Rfl: 1 .  propranolol (INDERAL) 20 MG tablet, Take 1 tablet (20 mg total) by mouth 3 (three) times daily as needed (as needed for tachycardia). (Patient not taking: Reported on 04/24/2020), Disp: 90 tablet, Rfl: 3 .  Vitamin D, Ergocalciferol, (DRISDOL) 1.25 MG (50000 UT) CAPS capsule, Take 1 capsule (50,000 Units total) by mouth every 7 (seven) days. Then switch to OTC D3 2000IU once daily ongoing. (Patient not taking: Reported on 04/24/2020), Disp: 8 capsule, Rfl: 0  No Known Allergies  With staff assistance, above reviewed with the patient today.   ROS: As per HPI, otherwise no specific complaints on a limited and focused system review    Objective  Virtual encounter, vitals not obtained.  Body mass index is 41.97 kg/m.  Physical Exam  Patient appears in NAD, pleasant Breathing: Effort normal. No respiratory distress. Speaking in complete sentences Neurological: Pt is alert and oriented,  Speech is normal.  Psychiatric: Patient has a normal mood and affect, Very appropriate with conversation, judgment and thought content normal.   No results found for this or any previous visit (from the past 72 hour(s)).  PHQ2/9: Depression screen [facility] 2/9 04/24/2020 12/06/2019 09/28/2019 08/27/2019 02/22/2019  Decreased Interest 0 0 1 0 1  Down, Depressed, Hopeless 1 0 0 0 1  PHQ - 2 Score 1 0 1 0 2  Altered sleeping 0 2 0 0 1  Tired, decreased energy 0 0 0 0 1  Change in appetite 0 0 0 0 0  Feeling bad or failure about yourself  0 0 0 0 1  Trouble concentrating 0 0 0 0 0  Moving  slowly or fidgety/restless 0 0 0 0 0  Suicidal thoughts 0 0 0 0 0  PHQ-9 Score 1 2 1  0 5  Difficult doing work/chores Not difficult at all Not difficult at all Somewhat difficult Not difficult at all Not difficult at all  Some recent data might be hidden   PHQ-2/9 Result reviewed   Fall Risk: Fall Risk  04/24/2020 12/06/2019 09/28/2019 08/27/2019 02/22/2019  Falls in the past year? 1 0 0 0 0  Number falls in past yr: 0 0 0 0 -  Injury with Fall? 1 0 0 0 -  Comment sprain - - - -  Follow up - Falls evaluation completed - Falls evaluation completed -     Assessment & Plan 1. Essential hypertension Blood pressure has been episodically higher on home checks, mainly diastolic, and she has been using hydrochlorothiazide, low-dose at 12.5 mg intermittently when her blood pressure is high.  She estimates approximately 3-4 times in the past couple months.  (Although notes she takes the hydrochlorothiazide 2-3 times a week).  I did note the cardiologist recommendation on the importance of keeping her blood pressure very well controlled, ideally closer to  120/80. It was felt best to take the low-dose hydrochlorothiazide and fatigue once a day routinely, and continue to have her check blood pressures at home. Did note when on this product regularly, would like to check her labs again a little more frequently, and she will make a follow-up with an in office visit in 2 to 3 months time to do so, and also to follow-up on some of the other issues above noted. She also states she is due to see cardiology again soon, and await their input with respect to her blood pressure as well.  2. Atrial fibrillation with RVR ([place]) She denies any recent heart racing episodes, and states she has not needed the medicines given to her by cardiology to use as needed in that realm.  Continue to monitor and continue follow-up with cardiology as planned.  3. Anxiety She notes her anxiety has been much better controlled in the recent past, and likely helpful with respect to her heart racing episodes not reoccurring.  We will follow-up in 2 to 3 months as noted above, sooner as needed, and also has a follow-up with cardiology soon as well planned.  I did inform her if her blood pressures are higher on checks despite the daily use of the hydrochlorothiazide medicine, should call to follow-up sooner as well.  I discussed the assessment and treatment plan with the patient. The patient was provided an opportunity to ask questions and all were answered. The patient agreed with the plan and demonstrated an understanding of the instructions.  The patient was advised to call back or seek an in-person evaluation if the symptoms worsen or if the condition fails to improve as anticipated.  I provided 20 minutes of non-face-to-face time during this encounter that included discussing at length patient's sx/history, pertinent pmhx, medications, treatment and follow up plan. This time also included the necessary documentation, orders, and chart review.

## 2020-04-24 ENCOUNTER — Other Ambulatory Visit: Payer: Self-pay | Admitting: *Deleted

## 2020-04-24 ENCOUNTER — Ambulatory Visit: Payer: Self-pay

## 2020-04-24 ENCOUNTER — Ambulatory Visit (INDEPENDENT_AMBULATORY_CARE_PROVIDER_SITE_OTHER): Payer: 59 | Admitting: Internal Medicine

## 2020-04-24 ENCOUNTER — Other Ambulatory Visit: Payer: Self-pay | Admitting: Family Medicine

## 2020-04-24 ENCOUNTER — Encounter: Payer: Self-pay | Admitting: Internal Medicine

## 2020-04-24 VITALS — BP 123/87 | HR 77 | Ht 66.0 in | Wt 260.0 lb

## 2020-04-24 DIAGNOSIS — I4891 Unspecified atrial fibrillation: Secondary | ICD-10-CM | POA: Diagnosis not present

## 2020-04-24 DIAGNOSIS — R69 Illness, unspecified: Secondary | ICD-10-CM | POA: Diagnosis not present

## 2020-04-24 DIAGNOSIS — F419 Anxiety disorder, unspecified: Secondary | ICD-10-CM

## 2020-04-24 DIAGNOSIS — I10 Essential (primary) hypertension: Secondary | ICD-10-CM

## 2020-04-24 MED ORDER — HYDROCHLOROTHIAZIDE 12.5 MG PO CAPS: 12.5000 mg | ORAL_CAPSULE | Freq: Every day | ORAL | 1 refills | Status: DC

## 2020-04-24 NOTE — Chronic Care Management (AMB) (Signed)
  Chronic Care Management   Outreach Note  04/24/2020 Name: Lori Oliver MRN: [id number] DOB: 10-Nov-1971  Primary Care Provider: Hubbard Hartshorn, FNP Reason for referral : Chronic Care Management   An unsuccessful telephone outreach was attempted today. [name] was referred to the case management team for assistance with care management and care coordination.   A HIPAA compliant voice message was left today requesting a return call.     Follow Up Plan The care management team will reach out to [name] again within the next week.    [facility] Care Management [phone]

## 2020-04-24 NOTE — Patient Outreach (Signed)
[facility]) Care Management [place] Telephone Outreach Care Coordination  04/24/2020  Lori Oliver [birth year] [id number]  Care Coordination Outreach re: [name]; 49 y/o female referred to [facility] by [facility] CMA for routine insurance referral.  Noted through review of EHR that patient has PCP with [facility] Chronic CM team embedded in practice; contacted Janalyn Shy, RN Lead embedded care coordination supervisor, along with Neldon Labella RN [facility] Chronic care manager embedded at [facility] family practice to make aware of referral to outreach patient.  ----- Message ----- From: Knox Royalty, RN Sent: 04/24/2020  11:00 AM EDT To: Clerance Lav, RN, Neldon Labella, RN Subject: [employer] HTN initiative referral from 427/2021    [facility],  This is the patient that I was inquiring about in our conversation from earlier this morning; she was referred by [facility] for the HTN initiative.  I have added your name to her care team, as advised.  Please let me know if you have any additional questions,  Lori Rack, RN, BSN, [place] Care Management  [phone]    Lori Rack, RN, BSN, Hand Coordinator [facility] Care Management  [phone]

## 2020-04-26 ENCOUNTER — Telehealth: Payer: Self-pay

## 2020-05-01 ENCOUNTER — Ambulatory Visit: Payer: Self-pay

## 2020-05-01 ENCOUNTER — Telehealth: Payer: Self-pay

## 2020-05-01 NOTE — Chronic Care Management (AMB) (Signed)
  Chronic Care Management   Outreach Note  05/01/2020 Name: TORIANA MCMANAMON MRN: [id number] DOB: 01/15/1971  Primary Care Provider: Towanda Malkin, MD Reason for referral : Chronic Care Management   A second unsuccessful telephone outreach was attempted today. Ms. Suto was referred to the case management team for assistance with care management and care coordination.    Follow Up Plan: The care management team will reach out to [name] again within the next two to three weeks.   [facility] Care Management [phone]

## 2020-05-12 ENCOUNTER — Ambulatory Visit: Payer: 59 | Admitting: Family Medicine

## 2020-05-19 ENCOUNTER — Telehealth: Payer: Self-pay

## 2020-05-19 ENCOUNTER — Ambulatory Visit: Payer: Self-pay

## 2020-05-22 NOTE — Chronic Care Management (AMB) (Signed)
  Chronic Care Management   Outreach Note   Name: Lori Oliver MRN: [id number] DOB: August 15, 1971  Primary Care Provider: Towanda Malkin, MD Reason for referral : [place]   Third unsuccessful telephone outreach was attempted today. Lori Oliver was referred to the care management team by Reginia Naas, RN for assistance with chronic care management and care coordination. Her primary care provider will be notified of our unsuccessful attempts to establish contact. The care management team will gladly outreach at any time in the future if she is interested in receiving assistance.   Follow Up Plan:  The care management team will gladly follow up with Lori Oliver after the primary care provider has a conversation with her regarding recommendation for care management engagement and subsequent re-referral for care management services.   [facility] Care Management [phone]

## 2020-07-04 ENCOUNTER — Ambulatory Visit: Payer: 59 | Admitting: Internal Medicine

## 2020-07-14 NOTE — Progress Notes (Signed)
Patient ID: Lori Oliver, female    DOB: 18-Nov-1971, 49 y.o.   MRN: [id number]  PCP: Towanda Malkin, MD  Chief Complaint  Patient presents with  . Follow-up    follow up on BP, she has not been taking BP meds becuase her pressure has been in the 120's range,     Subjective:   Lori Oliver is a 49 y.o. female, presents to clinic with CC of the following:  Chief Complaint  Patient presents with  . Follow-up    follow up on BP, she has not been taking BP meds becuase her pressure has been in the 120's range,     HPI:  Patient is a 49 year old female Last visit with me was in May 2021, a telephone visit Follows up today. Her last visit with Raquel Sarna was in 11/2019 with a pelvic exam and Pap done at that time Her husband passed in May, and she notes she is working through the grief, is seeing a Barrister's clerk.  She also has friends, and support with church group, and has been doing pretty well over this time all things considered.  Afib RVR intermittent/HTN: Medication regimen-hydrochlorothiazide 12.5 mg daily Had been using the medicine more intermittently and as needed previously, recommended taking it daily on last visit She notes she has not been taking the blood pressure medicine in the recent past, as her blood pressures have been well controlled on home checks. Checks her blood pressures at home and at work, running 120/70's. BP Readings from Last 3 Encounters:  07/17/20 114/78  04/24/20 123/87  12/06/19 122/84    Denies any recent chest pains, no increase in palpitations, shortness of breath, increased lower extremity swelling, increased headaches, vision changes  She is seeing Dr. [name] from cardiology for her Afib RVR and HTN, and in his last note, he noted LVH on the echocardiogram, and the importance of keeping her blood pressure very well controlled.   Has propanolol and diltiazem PRN per Dr. [name] to use as needed, took about 4 times since  husband passed in May.  Last labs were done in December 2020, and those were reviewed, with her comp panel all normal.  Anxiety/Stress/depression: Her husband had been sick with cancer and recently passed with increased stresses related to that.  She is currently seeing a grief counselor.  She notes she has a roller coaster of emotions at times, and does have support. PHQ 9 reviewed   Hyperlipidemia - low HDL Medication regimen-none Lab Results  Component Value Date   CHOL 164 12/06/2019   HDL 47 (L) 12/06/2019   LDLCALC 97 12/06/2019   TRIG 108 12/06/2019   CHOLHDL 3.5 12/06/2019    Prediabetes Medication regimen-none Lab Results  Component Value Date   HGBA1C 5.5 12/06/2019   HGBA1C 5.7 (H) 12/14/2018   HGBA1C 5.4 07/07/2017   Lab Results  Component Value Date   LDLCALC 97 12/06/2019   CREATININE 0.82 12/06/2019     Uterine Fibroid: Seeing Dr. Marcelline Mates; initially not large enough for surgical intervention, however Dr. Marcelline Mates noted at their visit 10/27/2019 that the fibroids were enlarged and she reviewed surgical and/or medication managmeent options, Pt declined at that time as her pain had started to improve. Her pain has been well controlled.    GERD: No recent increase symptoms of concern Noted if you eat foods that are triggers, can still get symptoms at times.  Has been doing her best to try to avoid those  foods. Medication regimen-Protonix as needed, not needed in recent past No difficulty swallowing, chronic abdominal pain, no blood in stool, dark or tarry stools.   Obesity: Wt Readings from Last 3 Encounters:  07/17/20 (!) 258 lb (117 kg)  04/24/20 260 lb (117.9 kg)  12/06/19 261 lb 6.4 oz (118.6 kg)    Diet - trying to watch diet, appetite down some recent past.  Notes has struggled with trying to lose weight in her past over time. Exercise-no regular exercise, trying to walk more    Patient Active Problem List   Diagnosis Date Noted  . Atrial  tachycardia ([place]) 05/17/2019  . Chest tightness 05/17/2019  . Intermittent atrial fibrillation ([place]) 02/19/2018  . Hypokalemia 02/10/2018  . Anxiety 01/13/2018  . Atrial fibrillation with RVR ([name]) 12/01/2017  . Vitamin D deficiency 07/08/2017  . Lower back pain 07/07/2017  . Morbid obesity ([place]) 07/07/2017  . Essential hypertension 07/11/2016  . IFG (impaired fasting glucose) 05/16/2016  . 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 Abnormal thyroid function test 05/16/2016  . Decreased libido 05/16/2016  . Breast lump on left side at 5 o'clock position 05/16/2016  . Bilateral thoracic back pain 06/01/2015  . History of abnormal cervical Pap smear 06/01/2015      Current Outpatient Medications:  .  diltiazem (CARDIZEM) 30 MG tablet, Take 1 tablet (30 mg total) by mouth 3 (three) times daily as needed (for HR > 120)., Disp: 30 tablet, Rfl: 0 .  propranolol (INDERAL) 20 MG tablet, Take 1 tablet (20 mg total) by mouth 3 (three) times daily as needed (as needed for tachycardia)., Disp: 90 tablet, Rfl: 3 .  hydrochlorothiazide (MICROZIDE) 12.5 MG capsule, Take 1 capsule (12.5 mg total) by mouth daily. (Patient not taking: Reported on 07/17/2020), Disp: 90 capsule, Rfl: 1   No Known Allergies   Past Surgical History:  Procedure Laterality Date  . BREAST BIOPSY Right 2015   benign  . ESOPHAGOGASTRODUODENOSCOPY (EGD) WITH PROPOFOL N/A 08/12/2018   Procedure: ESOPHAGOGASTRODUODENOSCOPY (EGD) WITH PROPOFOL;  Surgeon: Toledo, Benay Pike, MD;  Location: [facility] ENDOSCOPY;  Service: Gastroenterology;  Laterality: N/A;  . HAMMER TOE SURGERY       Family History  Problem Relation Age of Onset  . Hypertension Mother   . Heart disease Mother   . Arthritis Mother   . Depression Mother   . Stroke Maternal Grandmother      Social History   Tobacco Use  . Smoking status: Never Smoker  . Smokeless tobacco: Never Used  Substance Use Topics  . Alcohol use: Yes    Alcohol/week: 0.0 standard  drinks    Comment: occ    With staff assistance, above reviewed with the patient today.  ROS: As per HPI, otherwise no specific complaints on a limited and focused system review   No results found for this or any previous visit (from the past 72 hour(s)).   PHQ2/9: Depression screen [facility] 2/9 07/17/2020 04/24/2020 12/06/2019 09/28/2019 08/27/2019  Decreased Interest 1 0 0 1 0  Down, Depressed, Hopeless 1 1 0 0 0  PHQ - 2 Score 2 1 0 1 0  Altered sleeping 1 0 2 0 0  Tired, decreased energy 1 0 0 0 0  Change in appetite 1 0 0 0 0  Feeling bad or failure about yourself  0 0 0 0 0  Trouble concentrating 1 0 0 0 0  Moving slowly or fidgety/restless 0 0 0 0 0  Suicidal thoughts 0 0 0 0  0  PHQ-9 Score 6 1 2 1  0  Difficult doing work/chores Not difficult at all Not difficult at all Not difficult at all Somewhat difficult Not difficult at all  Some recent data might be hidden   PHQ-9 reviewed  Fall Risk: Fall Risk  07/17/2020 04/24/2020 12/06/2019 09/28/2019 08/27/2019  Falls in the past year? 0 1 0 0 0  Number falls in past yr: 0 0 0 0 0  Injury with Fall? 0 1 0 0 0  Comment - sprain - - -  Follow up - - Falls evaluation completed - Falls evaluation completed      Objective:   Vitals:   07/17/20 0807  BP: 114/78  Pulse: 85  Resp: 16  Temp: 97.8 F (36.6 C)  TempSrc: Temporal  SpO2: 100%  Weight: (!) 258 lb (117 kg)  Height: 5\' 6"  (1.676 m)    Body mass index is 41.64 kg/m.  Physical Exam   NAD, masked, pleasant HEENT - [place]/AT, sclera anicteric, PERRL, EOMI, conj - non-inj'ed, pharynx clear Neck - supple, no adenopathy, no TM, carotids 2+ and = without bruits bilat Car - RRR without m/g/r Pulm- RR and effort normal at rest, CTA without wheeze or rales Abd - soft, NT diffusely, obese Back - no CVA tenderness Ext - no LE edema,  Neuro/psychiatric - affect was not flat, appropriate with conversation  Alert and oriented  Grossly non-focal  Speech normal   Results for  orders placed or performed in visit on 12/06/19  Lipid panel  Result Value Ref Range   Cholesterol 164 <200 mg/dL   HDL 47 (L) > OR = 50 mg/dL   Triglycerides 108 <150 mg/dL   LDL Cholesterol (Calc) 97 mg/dL (calc)   Total CHOL/HDL Ratio 3.5 <5.0 (calc)   Non-HDL Cholesterol (Calc) 117 <130 mg/dL (calc)  COMPLETE METABOLIC PANEL WITH GFR  Result Value Ref Range   Glucose, Bld 65 65 - 99 mg/dL   BUN 11 7 - 25 mg/dL   Creat 0.82 0.50 - 1.10 mg/dL   GFR, Est Non African American 85 > OR = 60 mL/min/1.25m2   GFR, Est African American 99 > OR = 60 mL/min/1.40m2   BUN/Creatinine Ratio NOT APPLICABLE 6 - 22 (calc)   Sodium 139 135 - 146 mmol/L   Potassium 4.2 3.5 - 5.3 mmol/L   Chloride 104 98 - 110 mmol/L   CO2 29 20 - 32 mmol/L   Calcium 9.2 8.6 - 10.2 mg/dL   Total Protein 7.1 6.1 - 8.1 g/dL   Albumin 4.0 3.6 - 5.1 g/dL   Globulin 3.1 1.9 - 3.7 g/dL (calc)   AG Ratio 1.3 1.0 - 2.5 (calc)   Total Bilirubin 0.3 0.2 - 1.2 mg/dL   Alkaline phosphatase (APISO) 66 31 - 125 U/L   AST 12 10 - 35 U/L   ALT 9 6 - 29 U/L  Hemoglobin A1c  Result Value Ref Range   Hgb A1c MFr Bld 5.5 <5.7 % of total Hgb   Mean Plasma Glucose 111 (calc)   eAG (mmol/L) 6.2 (calc)  VITAMIN D 25 Hydroxy (Vit-D Deficiency, Fractures)  Result Value Ref Range   Vit D, 25-Hydroxy 11 (L) 30 - 100 ng/mL  Cytology - PAP  Result Value Ref Range   High risk HPV Negative    Adequacy      Satisfactory for evaluation; transformation zone component PRESENT.   Diagnosis      - Negative for intraepithelial lesion or malignancy (NILM)  Comment Normal Reference Range HPV - Negative    Last labs reviewed    Assessment & Plan:   1. Essential hypertension Her blood pressure has been well controlled with checks at home and at work recently, and on checks here as well. Had been on a low-dose hydrochlorothiazide to help in the past. Feel best to remain off of that medicine presently, and continue to monitor with outside  checks. If she notes her blood pressures tending to increase gradually over time her has persistent readings that are slightly elevated, she should contact us and may discuss going back on that medicine over time.  2. Atrial fibrillation with RVR (HCC) Has remained controlled, with it remaining stable despite the increase stresses in the recent past with her husband passing away.  She noted she was not sure if it was palpitations or more stress/anxiety/panic attack issues, and used the as needed medicine cardiology provided several times, but not all that frequently. No concerns on exam today. We will continue to monitor.  3. Anxiety 4. Reactive depression Stress is increased with her husband's passing recently and is seeing grief counseling and to continue. She also notes support she has which has been helpful.  5. Hyperlipidemia, unspecified hyperlipidemia type Continuing to monitor, with last lipid panel reviewed.  6. Prediabetes Last A1c was good as noted, and will continue to monitor.  7. Gastroesophageal reflux disease without esophagitis Symptoms have remained very well controlled, and she has not needed the proton pump inhibitor in the recent past. She is aware of foods that are triggering continue to try to avoid those over time and monitor.  8. Class 3 severe obesity due to excess calories with serious comorbidity and body mass index (BMI) of 40.0 to 44.9 in adult [facility]) Discussed importance of healthy weight maintenance over time and she is well aware of that. Her weight has been stable in the recent past, actually dropped a couple pounds, and do feel likely the recent stressors have contributed in some respects to that regard. Continue to monitor.  9. Uterine leiomyoma, unspecified location Her pain has remained controlled, with no more concerns in the recent past. Did recommend still having Dr. Marcelline Mates involved over time, and continue to monitor.   We will tentatively  schedule follow-up in 4 months time, and will recheck labs at that point and recommended she come fasting for that visit to be able to obtain. Follow-up sooner as needed.    Towanda Malkin, MD 07/17/20 8:20 AM

## 2020-07-17 ENCOUNTER — Other Ambulatory Visit: Payer: Self-pay

## 2020-07-17 ENCOUNTER — Ambulatory Visit (INDEPENDENT_AMBULATORY_CARE_PROVIDER_SITE_OTHER): Payer: 59 | Admitting: Internal Medicine

## 2020-07-17 ENCOUNTER — Encounter: Payer: Self-pay | Admitting: Internal Medicine

## 2020-07-17 VITALS — BP 114/78 | HR 85 | Temp 97.8°F | Resp 16 | Ht 66.0 in | Wt 258.0 lb

## 2020-07-17 DIAGNOSIS — R7303 Prediabetes: Secondary | ICD-10-CM | POA: Diagnosis not present

## 2020-07-17 DIAGNOSIS — F329 Major depressive disorder, single episode, unspecified: Secondary | ICD-10-CM | POA: Diagnosis not present

## 2020-07-17 DIAGNOSIS — D259 Leiomyoma of uterus, unspecified: Secondary | ICD-10-CM

## 2020-07-17 DIAGNOSIS — K219 Gastro-esophageal reflux disease without esophagitis: Secondary | ICD-10-CM | POA: Diagnosis not present

## 2020-07-17 DIAGNOSIS — Z6841 Body Mass Index (BMI) 40.0 and over, adult: Secondary | ICD-10-CM | POA: Diagnosis not present

## 2020-07-17 DIAGNOSIS — F419 Anxiety disorder, unspecified: Secondary | ICD-10-CM | POA: Diagnosis not present

## 2020-07-17 DIAGNOSIS — E66813 Obesity, class 3: Secondary | ICD-10-CM

## 2020-07-17 DIAGNOSIS — I4891 Unspecified atrial fibrillation: Secondary | ICD-10-CM | POA: Diagnosis not present

## 2020-07-17 DIAGNOSIS — R69 Illness, unspecified: Secondary | ICD-10-CM | POA: Diagnosis not present

## 2020-07-17 DIAGNOSIS — I10 Essential (primary) hypertension: Secondary | ICD-10-CM

## 2020-07-17 DIAGNOSIS — E785 Hyperlipidemia, unspecified: Secondary | ICD-10-CM

## 2020-07-17 HISTORY — DX: Gastro-esophageal reflux disease without esophagitis: K21.9

## 2020-07-17 HISTORY — DX: Hyperlipidemia, unspecified: E78.5

## 2020-07-17 HISTORY — DX: Major depressive disorder, single episode, unspecified: F32.9

## 2020-07-21 ENCOUNTER — Encounter: Payer: Self-pay | Admitting: Internal Medicine

## 2020-07-21 ENCOUNTER — Ambulatory Visit (INDEPENDENT_AMBULATORY_CARE_PROVIDER_SITE_OTHER): Payer: 59 | Admitting: Internal Medicine

## 2020-07-21 ENCOUNTER — Other Ambulatory Visit: Payer: Self-pay

## 2020-07-21 VITALS — BP 128/84 | HR 90 | Temp 98.0°F | Resp 16 | Ht 66.0 in | Wt 259.8 lb

## 2020-07-21 DIAGNOSIS — S0081XA Abrasion of other part of head, initial encounter: Secondary | ICD-10-CM | POA: Diagnosis not present

## 2020-07-21 NOTE — Progress Notes (Signed)
Patient ID: Lori Oliver, female    DOB: 15-Sep-1971, 49 y.o.   MRN: [id number]  PCP: Towanda Malkin, MD  Chief Complaint  Patient presents with  . spot on face    has been there for about a week, spot got real dark and now an open sore    Subjective:   Lori Oliver is a 49 y.o. female, presents to clinic with CC of the following:  Chief Complaint  Patient presents with  . spot on face    has been there for about a week, spot got real dark and now an open sore    HPI:  Patient is a 49 year old female Was recently seen 07/17/2020 with that note reviewed. Follows up today with concerns for a spot on her face.     She noted about a week ago, a small spot in this area on her face below her mouth and left-sided, that got dark, opened up, and now has a small open area present.  It is not painful, it itches some.  No purulent type drainage from this, and has been covered as she needs to wear a mask at work in addition to a face shield. She could not get into see dermatology when she called, and presents here.  Patient Active Problem List   Diagnosis Date Noted  . Prediabetes 07/17/2020  . Hyperlipidemia 07/17/2020  . 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  . Gastroesophageal reflux disease without esophagitis 07/17/2020  . Uterine leiomyoma 07/17/2020  . Atrial tachycardia ([place]) 05/17/2019  . Chest tightness 05/17/2019  . Intermittent atrial fibrillation ([place]) 02/19/2018  . Hypokalemia 02/10/2018  . Anxiety 01/13/2018  . Atrial fibrillation with RVR ([place]) 12/01/2017  . Vitamin D deficiency 07/08/2017  . Lower back pain 07/07/2017  . Class 3 severe obesity due to excess calories with serious comorbidity and body mass index (BMI) of 40.0 to 44.9 in adult ([place]) 07/07/2017  . Essential hypertension 07/11/2016  . IFG (impaired fasting glucose) 05/16/2016  . 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 Abnormal thyroid function test 05/16/2016  . Decreased libido 05/16/2016   . Breast lump on left side at 5 o'clock position 05/16/2016  . Bilateral thoracic back pain 06/01/2015  . History of abnormal cervical Pap smear 06/01/2015      Current Outpatient Medications:  .  diltiazem (CARDIZEM) 30 MG tablet, Take 1 tablet (30 mg total) by mouth 3 (three) times daily as needed (for HR > 120)., Disp: 30 tablet, Rfl: 0 .  propranolol (INDERAL) 20 MG tablet, Take 1 tablet (20 mg total) by mouth 3 (three) times daily as needed (as needed for tachycardia)., Disp: 90 tablet, Rfl: 3 .  hydrochlorothiazide (MICROZIDE) 12.5 MG capsule, Take 1 capsule (12.5 mg total) by mouth daily. (Patient not taking: Reported on 07/17/2020), Disp: 90 capsule, Rfl: 1   No Known Allergies   Past Surgical History:  Procedure Laterality Date  . BREAST BIOPSY Right 2015   benign  . ESOPHAGOGASTRODUODENOSCOPY (EGD) WITH PROPOFOL N/A 08/12/2018   Procedure: ESOPHAGOGASTRODUODENOSCOPY (EGD) WITH PROPOFOL;  Surgeon: Toledo, Benay Pike, MD;  Location: [facility] ENDOSCOPY;  Service: Gastroenterology;  Laterality: N/A;  . HAMMER TOE SURGERY       Family History  Problem Relation Age of Onset  . Hypertension Mother   . Heart disease Mother   . Arthritis Mother   . Depression Mother   . Stroke Maternal Grandmother      Social History   Tobacco Use  . Smoking status:  Never Smoker  . Smokeless tobacco: Never Used  Substance Use Topics  . Alcohol use: Yes    Alcohol/week: 0.0 standard drinks    Comment: occ    With staff assistance, above reviewed with the patient today.  ROS: As per HPI, otherwise no specific complaints on a limited and focused system review   No results found for this or any previous visit (from the past 72 hour(s)).   PHQ2/9: Depression screen [facility] 2/9 07/21/2020 07/17/2020 04/24/2020 12/06/2019 09/28/2019  Decreased Interest 1 1 0 0 1  Down, Depressed, Hopeless 1 1 1  0 0  PHQ - 2 Score 2 2 1  0 1  Altered sleeping 1 1 0 2 0  Tired, decreased energy 1 1 0 0 0  Change  in appetite 1 1 0 0 0  Feeling bad or failure about yourself  0 0 0 0 0  Trouble concentrating 1 1 0 0 0  Moving slowly or fidgety/restless 0 0 0 0 0  Suicidal thoughts 0 0 0 0 0  PHQ-9 Score 6 6 1 2 1   Difficult doing work/chores Not difficult at all Not difficult at all Not difficult at all Not difficult at all Somewhat difficult  Some recent data might be hidden   PHQ-2/9 Result reviewed  Fall Risk: Fall Risk  07/17/2020 04/24/2020 12/06/2019 09/28/2019 08/27/2019  Falls in the past year? 0 1 0 0 0  Number falls in past yr: 0 0 0 0 0  Injury with Fall? 0 1 0 0 0  Comment - sprain - - -  Follow up - - Falls evaluation completed - Falls evaluation completed      Objective:   Vitals:   07/21/20 1345  BP: 128/84  Pulse: 90  Resp: 16  Temp: 98 F (36.7 C)  TempSrc: Temporal  Weight: 259 lb 12.8 oz (117.8 kg)  Height: 5\' 6"  (1.676 m)    Body mass index is 41.93 kg/m.  Physical Exam   NAD, masked, pleasant HEENT - [place]/AT, sclera anicteric, conj - non-inj'ed,  Neck - supple,  Skin-a very small circular abrasion was present on the anterior face below the mouth left-sided, with the appearance of the very superficial skin layer removed in this area, no marked surrounding erythema, no drainage, no other lesions of concern noted, It was under the area of her mask Neuro/psychiatric - affect was not flat, appropriate with conversation  Alert with speech normal  Results for orders placed or performed in visit on 12/06/19  Lipid panel  Result Value Ref Range   Cholesterol 164 <200 mg/dL   HDL 47 (L) > OR = 50 mg/dL   Triglycerides 108 <150 mg/dL   LDL Cholesterol (Calc) 97 mg/dL (calc)   Total CHOL/HDL Ratio 3.5 <5.0 (calc)   Non-HDL Cholesterol (Calc) 117 <130 mg/dL (calc)  COMPLETE METABOLIC PANEL WITH GFR  Result Value Ref Range   Glucose, Bld 65 65 - 99 mg/dL   BUN 11 7 - 25 mg/dL   Creat 0.82 0.50 - 1.10 mg/dL   GFR, Est Non African American 85 > OR = 60 mL/min/1.93m2    GFR, Est African American 99 > OR = 60 mL/min/1.8m2   BUN/Creatinine Ratio NOT APPLICABLE 6 - 22 (calc)   Sodium 139 135 - 146 mmol/L   Potassium 4.2 3.5 - 5.3 mmol/L   Chloride 104 98 - 110 mmol/L   CO2 29 20 - 32 mmol/L   Calcium 9.2 8.6 - 10.2 mg/dL   Total Protein  7.1 6.1 - 8.1 g/dL   Albumin 4.0 3.6 - 5.1 g/dL   Globulin 3.1 1.9 - 3.7 g/dL (calc)   AG Ratio 1.3 1.0 - 2.5 (calc)   Total Bilirubin 0.3 0.2 - 1.2 mg/dL   Alkaline phosphatase (APISO) 66 31 - 125 U/L   AST 12 10 - 35 U/L   ALT 9 6 - 29 U/L  Hemoglobin A1c  Result Value Ref Range   Hgb A1c MFr Bld 5.5 <5.7 % of total Hgb   Mean Plasma Glucose 111 (calc)   eAG (mmol/L) 6.2 (calc)  VITAMIN D 25 Hydroxy (Vit-D Deficiency, Fractures)  Result Value Ref Range   Vit D, 25-Hydroxy 11 (L) 30 - 100 ng/mL  Cytology - PAP  Result Value Ref Range   High risk HPV Negative    Adequacy      Satisfactory for evaluation; transformation zone component PRESENT.   Diagnosis      - Negative for intraepithelial lesion or malignancy (NILM)   Comment Normal Reference Range HPV - Negative        Assessment & Plan:   1. Abrasion, face w/o infection, very small superficial Educated on this today, with the most important component keeping the area clean. Recommending cleaning with warm soapy water several times a day, and if any concerning exposures are anticipated, can loosely cover. She does have a protective by her mask when she is at work Also recommended not scratching at the area, trying not to touch the area to lessen chance of infection. Do think will heal over time, and can follow-up if not improving or more problematic.  Also if any concerns of infection arise, needs to follow-up. She asked if she needs an antibiotic, and I informed her I do not think that is necessary nor will be helpful.  Do not feel she needs to see a dermatologist at this point, and they could not see her for a month, and we will follow-up if not  improving or more problematic as we discussed today.     Towanda Malkin, MD 07/21/20 1:50 PM

## 2020-07-21 NOTE — Patient Instructions (Addendum)
Keep the area clean, using warm soapy water several times a day  Avoid scratching at the area or frequent touching.

## 2020-08-02 ENCOUNTER — Telehealth: Payer: Self-pay | Admitting: Internal Medicine

## 2020-08-02 MED ORDER — FLUCONAZOLE 150 MG PO TABS: 150.0000 mg | ORAL_TABLET | Freq: Once | ORAL | 0 refills | Status: AC

## 2020-08-02 NOTE — Telephone Encounter (Signed)
Medication Refill - Medication: fluconazole (DIFLUCAN) 150 MG tablet Pt asked if Dr. Roxan Hockey can send this in to treat  Yeast infection/Pt didn't want to be seen/ please advise   Has the patient contacted their pharmacy? No. (Agent: If no, request that the patient contact the pharmacy for the refill.) (Agent: If yes, when and what did the pharmacy advise?)  Preferred Pharmacy (with phone number or street name):  [place], [place] Phone:  [phone]  Fax:  [phone]       Agent: Please be advised that RX refills may take up to 3 business days. We ask that you follow-up with your pharmacy.

## 2020-08-02 NOTE — Telephone Encounter (Signed)
Called patient and she has been experiencing some vaginal itching, irritation/rash more external and feels there is some discharge/moisture. Patient advised a prescription of diflucan will be sent but if symptoms persist she must follow up in office with Dr. Ancil Boozer or [name], PA-C.

## 2020-08-02 NOTE — Telephone Encounter (Signed)
Could you please call the patient and get some history to help make sure that it is consistent with a probable yeast infection. If so, can send in a prescription for Diflucan, with a follow-up needed after if symptoms not improving or more problematic despite that. Thanks

## 2020-08-10 ENCOUNTER — Telehealth: Payer: Self-pay | Admitting: Obstetrics and Gynecology

## 2020-08-10 MED ORDER — FLUCONAZOLE 150 MG PO TABS: 150.0000 mg | ORAL_TABLET | Freq: Once | ORAL | 1 refills | Status: AC

## 2020-08-10 NOTE — Telephone Encounter (Signed)
Patient called in stating she had made an appointment for a yeast infection and she went to her primary care and they prescribed her diflucan and that it helped but she is still having lingering symptoms. Patient stated it's not and std because she is not sexually active. She is requesting Dr. Marcelline Mates send in another prescription rather than her having to come in to be seen. Informed patient that it was best if we keep her scheduled appointment just in case Dr. Marcelline Mates would like to see her, but I would send a message to her provider to see what we could do.   Could you please advise?

## 2020-08-10 NOTE — Telephone Encounter (Signed)
Pt called no answer to speak about her call to the office. No answer LM via VM that her diflucan had been sent to [facility] on [address] with one refill. Pt was advised that if she needed to take the 2nd dose to please allow 7 days between each dose. Pt was also informed that if after the 2nd dose she is still having issues that she will need to contact the office to schedule an appointment with [facility] to check to make sure that it is yeast and not something else.

## 2020-08-12 NOTE — Progress Notes (Signed)
Date:  08/14/2020   ID:  Lori Oliver, DOB July 26, 1971, MRN [id number]  Patient Location:  [address] [place] [address]   Provider location:   [facility], [place] office  PCP:  Towanda Malkin, MD  Cardiologist:  [facility]  Chief Complaint  Patient presents with  . other    12 month follow up. Meds reviewed by the pt. verbally. Pt. c/o irreg. heart beats at times and has shortness of breath with over exertion.      History of Present Illness:    Lori Oliver is a 49 y.o. female past medical history of anxiety Atrial fib in 12/01/2017 after having taken  Sudafed and cough medicine. HTN Morbid obesity  March 13, 2019 hospitalization for atrial fibrillation Who presents for follow-up of her paroxysmal atrial fibrillation, atypical chest pain  Last seen by telemetry visit June 2020  Prior event monitor May 2020 Event monitor No atrial fib Patient triggered events were not associated with significant arrhythmia Rare short runs of atrial tachycardia Off  HCTZ Started on metoprolol succinate 25 mg daily  Husband passed from cancer, colon, 3 months ago Long discussion concerning recent stressors over the past 3 months  On a nutrition shake  Rare episodes of tachycardia, takes propanolol diltiazem as needed Came off the metoprolol Blood pressure stable  Less anxiety, for long time was in and out of emergency room, Feels she is in a better place now and feels well overall  Still working Difficulty losing weight, no regular exercise program  EKG personally reviewed by myself on todays visit Shows normal sinus rhythm rate 81 bpm no significant ST or T wave changes  Past medical history reviewed March 13, 2019 hospitalization for atrial fibrillation sitting on her couch at rest watching television  acute onset of tachycardia with some chest tightness shortness of breath.  She does have propranolol but did not think to take  this Last episode of atrial fibrillation December 2018 after taking Sudafed and other cough medicine  heart rates in the low 200 range Started on diltiazem infusion Was kept in the hospital for pulmonary edema, started on Lasix, diltiazem for rate control  Converted back to normal sinus rhythm on diltiazem chads vasc 2 (female and hypertension) She prefered not to be on anticoagulation  -Risk factors include obesity, possible sleep apnea, hypertension  LVH on echocardiogram possibly secondary to longstanding hypertension  Recent emergency room visits as detailed below ER March 27, 2018 for chest pain atypical ER 02/28/2018 for fall, knee pain ER 02/11/2018 for arm pain ER 02/08/2018 for palpitations and shortness of breath. ER 2/8/209 with HTN ER 01/02/2018 for nausea ER 12/23/2017 for chest pain, atypical ER 12/21/2017 for SOB, was tearful ER 12/06/2017 for palplitations, , [facility] 12/01/2017 for atrial fib with RVR, seen by dr. Humphrey Rolls Heart rate was 190 bpm, developed after taking cold medication Started on sotolol, Noac As an outpatient had fatigue, did not feel comfortable taking anticoagulation   Prior CV studies:   The following studies were reviewed today:  She has had prior outpatient stress testing over a year ago  CT scan chest reviewed  from 2018 showing no coronary calcification or aortic atherosclerosis  Sleep test  mild sleep apnea She declined CPAP Stress test through [facility]  Echocardiogram March 14, 2019 1. The left ventricle has normal systolic function with an ejection fraction of 60-65%. The cavity size was normal. There is mild to moderately increased  left ventricular wall thickness. Left ventricular diastolic Doppler parameters are indeterminate. 2. The right ventricle has normal systolic function. The cavity was normal. There is no increase in right ventricular wall thickness. 3. Rhythm is atrial fibrillation  Past Medical History:  Diagnosis Date    . Abnormal thyroid function test 05/16/2016  . Anxiety   . Atrial fibrillation ([place])   . 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 Decreased libido 05/16/2016  . Dysrhythmia   . Gastroesophageal reflux disease without esophagitis 07/17/2020  . Hyperlipidemia 07/17/2020  . Hypertension   . Hypokalemia   . Impaired fasting glucose   . Morbid obesity ([place]) 06/01/2015  . Morbid obesity ([place])   . Plantar fasciitis   . 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   Past Surgical History:  Procedure Laterality Date  . BREAST BIOPSY Right 2015   benign  . ESOPHAGOGASTRODUODENOSCOPY (EGD) WITH PROPOFOL N/A 08/12/2018   Procedure: ESOPHAGOGASTRODUODENOSCOPY (EGD) WITH PROPOFOL;  Surgeon: Toledo, Benay Pike, MD;  Location: [facility] ENDOSCOPY;  Service: Gastroenterology;  Laterality: N/A;  . HAMMER TOE SURGERY       Current Meds  Medication Sig  . diltiazem (CARDIZEM) 30 MG tablet Take 1 tablet (30 mg total) by mouth 3 (three) times daily as needed (for HR > 120).  . propranolol (INDERAL) 20 MG tablet Take 1 tablet (20 mg total) by mouth 3 (three) times daily as needed (as needed for tachycardia).     Allergies:   Patient has no known allergies.   Social History   Tobacco Use  . Smoking status: Never Smoker  . Smokeless tobacco: Never Used  Vaping Use  . Vaping Use: Never used  Substance Use Topics  . Alcohol use: Yes    Alcohol/week: 0.0 standard drinks    Comment: occ  . Drug use: No     Current Outpatient Medications on File Prior to Visit  Medication Sig Dispense Refill  . diltiazem (CARDIZEM) 30 MG tablet Take 1 tablet (30 mg total) by mouth 3 (three) times daily as needed (for HR > 120). 30 tablet 0  . propranolol (INDERAL) 20 MG tablet Take 1 tablet (20 mg total) by mouth 3 (three) times daily as needed (as needed for tachycardia). 90 tablet 3   No current facility-administered medications on file prior to visit.     Family Hx: The patient's family history includes Arthritis in her mother;  Depression in her mother; Heart disease in her mother; Hypertension in her mother; Stroke in her maternal grandmother.  ROS:   Please see the history of present illness.    Review of Systems  Constitutional: Negative.   Respiratory: Negative.   Cardiovascular: Negative.   Gastrointestinal: Negative.   Musculoskeletal: Negative.   Neurological: Negative.   Psychiatric/Behavioral: The patient is nervous/anxious.   All other systems reviewed and are negative.    Labs/Other Tests and Data Reviewed:    Recent Labs: 10/03/2019: Hemoglobin 12.5; Platelets 288 12/06/2019: ALT 9; BUN 11; Creat 0.82; Potassium 4.2; Sodium 139   Recent Lipid Panel Lab Results  Component Value Date/Time   CHOL 164 12/06/2019 03:01 PM   CHOL 177 07/07/2017 03:43 PM   CHOL 165 03/09/2014 05:29 PM   TRIG 108 12/06/2019 03:01 PM   TRIG 96 03/09/2014 05:29 PM   HDL 47 (L) 12/06/2019 03:01 PM   HDL 49 07/07/2017 03:43 PM   HDL 60 03/09/2014 05:29 PM   CHOLHDL 3.5 12/06/2019 03:01 PM   LDLCALC 97 12/06/2019 03:01 PM   LDLCALC 86 03/09/2014 05:29  PM    Wt Readings from Last 3 Encounters:  08/14/20 259 lb 4 oz (117.6 kg)  07/21/20 259 lb 12.8 oz (117.8 kg)  07/17/20 (!) 258 lb (117 kg)     Exam:    Vital Signs: Vital signs may also be detailed in the HPI BP 128/86 (BP Location: Left Arm, Patient Position: Sitting, Cuff Size: Large)   Pulse 81   Ht 5\' 6"  (1.676 m)   Wt 259 lb 4 oz (117.6 kg)   LMP  (LMP Unknown)   SpO2 99%   BMI 41.84 kg/m    Constitutional:  oriented to person, place, and time. No distress.  HENT:  Head: Grossly normal Eyes:  no discharge. No scleral icterus.  Neck: No JVD, no carotid bruits  Cardiovascular: Regular rate and rhythm, no murmurs appreciated Pulmonary/Chest: Clear to auscultation bilaterally, no wheezes or rails Abdominal: Soft.  no distension.  no tenderness.  Musculoskeletal: Normal range of motion Neurological:  normal muscle tone. Coordination normal. No  atrophy Skin: Skin warm and dry Psychiatric: normal affect, pleasant   ASSESSMENT & PLAN:    Paroxysmal atrial fibrillation Prior event monitor  showing no atrial fibrillation No significant arrhythmia, takes propranolol and diltiazem as needed  Essential hypertension  LVH on echocardiogram Blood pressure stable, exercise regimen recommended  Chest pain Chest tightness secondary to atrial fibrillation with RVR heart rates in the 200 range in the emergency room None recently, no further work-up needed at this time  Adjustment disorder Long discussion concerning loss of husband, Recommend exercise program    Total encounter time more than 25 minutes  Greater than 50% was spent in counseling and coordination of care with the patient   Signed, Ida Rogue, MD  08/14/2020 10:04 AM    [place] Office [address], [place], [place] [address]

## 2020-08-14 ENCOUNTER — Other Ambulatory Visit: Payer: Self-pay

## 2020-08-14 ENCOUNTER — Ambulatory Visit (INDEPENDENT_AMBULATORY_CARE_PROVIDER_SITE_OTHER): Payer: 59 | Admitting: Cardiovascular Disease

## 2020-08-14 ENCOUNTER — Encounter: Payer: Self-pay | Admitting: Cardiovascular Disease

## 2020-08-14 VITALS — BP 128/86 | HR 81 | Ht 66.0 in | Wt 259.2 lb

## 2020-08-14 DIAGNOSIS — I4891 Unspecified atrial fibrillation: Secondary | ICD-10-CM | POA: Diagnosis not present

## 2020-08-14 DIAGNOSIS — I471 Supraventricular tachycardia: Secondary | ICD-10-CM | POA: Diagnosis not present

## 2020-08-14 DIAGNOSIS — I10 Essential (primary) hypertension: Secondary | ICD-10-CM | POA: Diagnosis not present

## 2020-08-14 DIAGNOSIS — R0789 Other chest pain: Secondary | ICD-10-CM

## 2020-08-14 MED ORDER — PROPRANOLOL HCL 20 MG PO TABS: 20.0000 mg | ORAL_TABLET | Freq: Three times a day (TID) | ORAL | 3 refills | Status: DC | PRN

## 2020-08-14 MED ORDER — DILTIAZEM HCL 30 MG PO TABS: 30.0000 mg | ORAL_TABLET | Freq: Three times a day (TID) | ORAL | 3 refills | Status: DC | PRN

## 2020-08-14 NOTE — Patient Instructions (Signed)
Medication Instructions:  No changes  If you need a refill on your cardiac medications before your next appointment, please call your pharmacy.    Lab work: No new labs needed   If you have labs (blood work) drawn today and your tests are completely normal, you will receive your results only by: . MyChart Message (if you have MyChart) OR . A paper copy in the mail If you have any lab test that is abnormal or we need to change your treatment, we will call you to review the results.   Testing/Procedures: No new testing needed   Follow-Up: At [facility], you and your health needs are our priority.  As part of our continuing mission to provide you with exceptional heart care, we have created designated Provider Care Teams.  These Care Teams include your primary Cardiologist (physician) and Advanced Practice Providers (APPs -  Physician Assistants and Nurse Practitioners) who all work together to provide you with the care you need, when you need it.  . You will need a follow up appointment in 12 months  . Providers on your designated Care Team:   . Christopher Berge, NP . Ryan Dunn, PA-C . Jacquelyn Visser, PA-C  Any Other Special Instructions Will Be Listed Below (If Applicable).  COVID-19 Vaccine Information can be found at: [email or web address] For questions related to vaccine distribution or appointments, please email [email or web address] or call [phone].     

## 2020-08-17 ENCOUNTER — Encounter: Payer: 59 | Admitting: Obstetrics and Gynecology

## 2020-08-23 DIAGNOSIS — L720 Epidermal cyst: Secondary | ICD-10-CM | POA: Diagnosis not present

## 2020-08-23 DIAGNOSIS — L918 Other hypertrophic disorders of the skin: Secondary | ICD-10-CM | POA: Diagnosis not present

## 2020-08-23 DIAGNOSIS — B078 Other viral warts: Secondary | ICD-10-CM | POA: Diagnosis not present

## 2020-08-23 DIAGNOSIS — L700 Acne vulgaris: Secondary | ICD-10-CM | POA: Diagnosis not present

## 2020-08-23 DIAGNOSIS — L02212 Cutaneous abscess of back [any part, except buttock]: Secondary | ICD-10-CM | POA: Diagnosis not present

## 2020-08-24 ENCOUNTER — Encounter: Payer: Self-pay | Admitting: Obstetrics and Gynecology

## 2020-09-18 DIAGNOSIS — B351 Tinea unguium: Secondary | ICD-10-CM | POA: Diagnosis not present

## 2020-09-18 DIAGNOSIS — B078 Other viral warts: Secondary | ICD-10-CM | POA: Diagnosis not present

## 2020-09-18 DIAGNOSIS — M79674 Pain in right toe(s): Secondary | ICD-10-CM | POA: Diagnosis not present

## 2020-09-18 DIAGNOSIS — L700 Acne vulgaris: Secondary | ICD-10-CM | POA: Diagnosis not present

## 2020-09-18 DIAGNOSIS — L600 Ingrowing nail: Secondary | ICD-10-CM | POA: Diagnosis not present

## 2020-10-05 DIAGNOSIS — L700 Acne vulgaris: Secondary | ICD-10-CM | POA: Diagnosis not present

## 2020-10-16 ENCOUNTER — Telehealth: Payer: Self-pay

## 2020-10-16 NOTE — Telephone Encounter (Signed)
Error

## 2020-10-23 ENCOUNTER — Encounter: Payer: Self-pay | Admitting: Family Medicine

## 2020-10-23 ENCOUNTER — Other Ambulatory Visit (HOSPITAL_COMMUNITY)
Admission: RE | Admit: 2020-10-23 | Discharge: 2020-10-23 | Disposition: A | Discharge status: Home / Self Care | Payer: 59 | Source: Ambulatory Visit | Attending: Family Medicine | Admitting: Family Medicine

## 2020-10-23 ENCOUNTER — Other Ambulatory Visit: Payer: Self-pay

## 2020-10-23 ENCOUNTER — Ambulatory Visit (INDEPENDENT_AMBULATORY_CARE_PROVIDER_SITE_OTHER): Payer: 59 | Admitting: Family Medicine

## 2020-10-23 VITALS — BP 118/76 | HR 89 | Temp 98.1°F | Resp 18 | Ht 66.0 in | Wt 261.2 lb

## 2020-10-23 DIAGNOSIS — R350 Frequency of micturition: Secondary | ICD-10-CM | POA: Insufficient documentation

## 2020-10-23 DIAGNOSIS — N761 Subacute and chronic vaginitis: Secondary | ICD-10-CM | POA: Diagnosis not present

## 2020-10-23 DIAGNOSIS — R351 Nocturia: Secondary | ICD-10-CM | POA: Insufficient documentation

## 2020-10-23 DIAGNOSIS — D259 Leiomyoma of uterus, unspecified: Secondary | ICD-10-CM | POA: Diagnosis not present

## 2020-10-23 LAB — POCT URINALYSIS DIPSTICK
Bilirubin, UA: NEGATIVE
Blood, UA: NEGATIVE
Glucose, UA: NEGATIVE
Ketones, UA: NEGATIVE
Leukocytes, UA: NEGATIVE
Nitrite, UA: NEGATIVE
Protein, UA: NEGATIVE
Spec Grav, UA: 1.01 (ref 1.010–1.025)
Urobilinogen, UA: NEGATIVE E.U./dL — AB
pH, UA: 6 (ref 5.0–8.0)

## 2020-10-23 MED ORDER — NITROFURANTOIN MONOHYD MACRO 100 MG PO CAPS: 100.0000 mg | ORAL_CAPSULE | Freq: Two times a day (BID) | ORAL | 0 refills | Status: DC

## 2020-10-23 NOTE — Progress Notes (Signed)
Patient ID: Lori Oliver, female    DOB: 1971/07/19, 49 y.o.   MRN: [id number]  PCP: Towanda Malkin, MD  Chief Complaint  Patient presents with  . Urinary Frequency    x2 weeks    Subjective:   Lori Oliver is a 49 y.o. female, presents to clinic with CC of the following:  Patient presents with several weeks history with the last 2 weeks of worsening of urinary frequency all day and worsening at night with suprapubic and low pelvic pressure some mild intermittent dysuria that improved when she tried Azo.  She has a history of uterine fibroids.  She did perimenopausal had gone almost a year without a period and then had some spotting 2 times shortly after the death of her husband last 05-28-2023.  She does see Dr. Marcelline Mates  Urinary Frequency  This is a new problem. The current episode started 1 to 4 weeks ago. The problem has been unchanged. Quality: pressure, some intermittent burning. There has been no fever. She is not sexually active. There is no history of pyelonephritis. Associated symptoms include a discharge, frequency and urgency. Pertinent negatives include no chills, flank pain, hematuria, hesitancy, nausea, possible pregnancy, sweats or vomiting. Treatments tried: AZO. Improvement on treatment: azo helped with discomfort, but she continues to have frequency, nocturia and feeling of genitals being very "wet" without any frank incontinence or vaginal sx.    Pelvic ultrasound from October 2020 reviewed: Patient Name: Lori Oliver DOB: 07-13-1971 MRN: [id number] ULTRASOUND REPORT  Location: [facility]  Date of Service: 10/06/2019   Indications:Enlarged Uterus Findings:  The uterus is anteverted and measures 12 x 7.9 x 9.0 cm Echo texture is heterogenous with evidence of focal masses. Within the uterus are multiple suspected fibroids measuring: Fibroid 1:Anterior  IM 3.8 x 3.0 x 3.4 cm Fibroid 2:Anterior Lt IM 2.5 x 3.8 x 3.4 cm Fibroid 3: Posterior IM 7.4  x 6.1 x 6.2 cm  The Endometrium measures 10 mm.  Right Ovary measures 2.7 x 1.4 x 2.7  cm. It is normal in appearance. Left Ovary measures 3.4 x 1.7 x 3.2 cm. It is normal in appearance. Survey of the adnexa demonstrates no adnexal masses. There is no free fluid in the cul de sac.  Impression: 1. Enlarge fibroid uterus as described above.  Patient has a follow-up with Dr. Marcelline Mates on Friday but she was hoping to cancel if she had a simple UTI. Symptoms are similar to one time in the past when she had UTI.  She denies any abdominal pain, nausea, vomiting, flank pain. She has had some urinary odor, sometimes urine volume is normal other times very scant amount.  Pt denies any chance of STDs, she does not feel like she has vaginal discharge or vaginitis  Patient Active Problem List   Diagnosis Date Noted  . Prediabetes 07/17/2020  . Hyperlipidemia 07/17/2020  . 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  . Gastroesophageal reflux disease without esophagitis 07/17/2020  . Uterine leiomyoma 07/17/2020  . Atrial tachycardia ([place]) 05/17/2019  . Chest tightness 05/17/2019  . Intermittent atrial fibrillation ([place]) 02/19/2018  . Hypokalemia 02/10/2018  . Anxiety 01/13/2018  . Atrial fibrillation with RVR ([place]) 12/01/2017  . Vitamin D deficiency 07/08/2017  . Lower back pain 07/07/2017  . Class 3 severe obesity due to excess calories with serious comorbidity and body mass index (BMI) of 40.0 to 44.9 in adult ([place]) 07/07/2017  . Essential hypertension 07/11/2016  . IFG (impaired fasting glucose) 05/16/2016  .  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 Abnormal thyroid function test 05/16/2016  . Decreased libido 05/16/2016  . Breast lump on left side at 5 o'clock position 05/16/2016  . Bilateral thoracic back pain 06/01/2015  . History of abnormal cervical Pap smear 06/01/2015      Current Outpatient Medications:  .  diltiazem (CARDIZEM) 30 MG tablet, Take 1 tablet (30 mg total) by mouth 3  (three) times daily as needed (for HR > 120)., Disp: 90 tablet, Rfl: 3 .  propranolol (INDERAL) 20 MG tablet, Take 1 tablet (20 mg total) by mouth 3 (three) times daily as needed (as needed for tachycardia)., Disp: 90 tablet, Rfl: 3   No Known Allergies   Social History   Tobacco Use  . Smoking status: Never Smoker  . Smokeless tobacco: Never Used  Vaping Use  . Vaping Use: Never used  Substance Use Topics  . Alcohol use: Yes    Alcohol/week: 0.0 standard drinks    Comment: occ  . Drug use: No      Chart Review Today: I personally reviewed active problem list, medication list, allergies, family history, social history, health maintenance, notes from last encounter, lab results, imaging with the patient/caregiver today.   Review of Systems  Constitutional: Negative.  Negative for chills.  HENT: Negative.   Eyes: Negative.   Respiratory: Negative.   Cardiovascular: Negative.   Gastrointestinal: Negative.  Negative for nausea and vomiting.  Endocrine: Negative.   Genitourinary: Positive for frequency and urgency. Negative for flank pain, hematuria and hesitancy.  Musculoskeletal: Negative.   Skin: Negative.   Allergic/Immunologic: Negative.   Neurological: Negative.   Hematological: Negative.   Psychiatric/Behavioral: Negative.   All other systems reviewed and are negative.  10 Systems reviewed and are negative for acute change except as noted in the HPI.     Objective:   Vitals:   10/23/20 1106  BP: 118/76  Pulse: 89  Resp: 18  Temp: 98.1 F (36.7 C)  TempSrc: Oral  SpO2: 95%  Weight: 261 lb 3.2 oz (118.5 kg)  Height: 5\' 6"  (1.676 m)    Body mass index is 42.16 kg/m.  Physical Exam Vitals and nursing note reviewed.  Constitutional:      General: She is not in acute distress.    Appearance: She is obese. She is not ill-appearing, toxic-appearing or diaphoretic.     Comments: Well appearing female  Cardiovascular:     Rate and Rhythm: Normal rate and  regular rhythm.     Pulses: Normal pulses.     Heart sounds: Normal heart sounds.  Pulmonary:     Effort: Pulmonary effort is normal.     Breath sounds: Normal breath sounds.  Abdominal:     General: Bowel sounds are normal. There is no distension.     Palpations: Abdomen is soft.     Tenderness: There is no abdominal tenderness. There is no right CVA tenderness, left CVA tenderness, guarding or rebound.  Skin:    General: Skin is warm and dry.     Coloration: Skin is not jaundiced.  Neurological:     Mental Status: She is alert. Mental status is at baseline.  Psychiatric:        Mood and Affect: Mood normal.        Behavior: Behavior normal.         Results for orders placed or performed in visit on 10/23/20  POCT urinalysis dipstick  Result Value Ref Range   Color, UA gold  Clarity, UA clear    Glucose, UA Negative Negative   Bilirubin, UA negative    Ketones, UA negative    Spec Grav, UA 1.010 1.010 - 1.025   Blood, UA negative    pH, UA 6.0 5.0 - 8.0   Protein, UA Negative Negative   Urobilinogen, UA negative (A) 0.2 or 1.0 E.U./dL   Nitrite, UA negative    Leukocytes, UA Negative Negative   Appearance clear    Odor none        Assessment & Plan:   1. Frequent urination Urine dip unremarkable -reviewed this with the patient Unlikely to be DM - normal sugars and A1C with last labs, no other sx suggestive R/O vaginal causes Discussed it may be structural related to her very large fibroids -patient also notes that when she is standing up her urinary frequency is decreased and when she is sitting like at work there is a lot more pressure and frequency  Also in the differential is atrophic vaginitis, overactive bladder Patient was given a handout on urinary frequency encouraged to do a journal some bladder training and try and cut out any drinks supplements or caffeine other things that may irritate bladder  After discussion with the patient she did want to  start antibiotics even though the dip was negative and she did not want to wait for a urine culture  - POCT urinalysis dipstick - nitrofurantoin, macrocrystal-monohydrate, (MACROBID) 100 MG capsule; Take 1 capsule (100 mg total) by mouth 2 (two) times daily.  Dispense: 10 capsule; Refill: 0 - Urinalysis, microscopic only - Urine Culture - Cervicovaginal ancillary only  2. Nocturia See above - Urinalysis, microscopic only - Urine Culture - Cervicovaginal ancillary only  3. Subacute vaginitis Did asked the patient to do a vaginal swab just to rule out other possible causes of her urinary symptoms.  She also notes that she has a lot of vulvovaginal moisture but she denies any specific urinary incontinence or vaginal discharge. She denies any chance of STDs, will test for everything simply to rule everything out at this point - Cervicovaginal ancillary only  4. Uterine leiomyoma, unspecified location Did encourage her to keep her appointment with Dr. Andreas Blower that they may follow-up on findings from last October      [name], PA-C 10/23/20 11:28 AM

## 2020-10-23 NOTE — Patient Instructions (Signed)
Urinary Frequency, Adult Urinary frequency means urinating more often than usual. You may urinate every 1-2 hours even though you drink a normal amount of fluid and do not have a bladder infection or condition. Although you urinate more often than normal, the total amount of urine produced in a day is normal. With urinary frequency, you may have an urgent need to urinate often. The stress and anxiety of needing to find a bathroom quickly can make this urge worse. This condition may go away on its own or you may need treatment at home. Home treatment may include bladder training, exercises, taking medicines, or making changes to your diet. Follow these instructions at home: Bladder health   Keep a bladder diary if told by your health care provider. Keep track of: ? What you eat and drink. ? How often you urinate. ? How much you urinate.  Follow a bladder training program if told by your health care provider. This may include: ? Learning to delay going to the bathroom. ? Double urinating (voiding). This helps if you are not completely emptying your bladder. ? Scheduled voiding.  Do Kegel exercises as told by your health care provider. Kegel exercises strengthen the muscles that help control urination, which may help the condition. Eating and drinking  If told by your health care provider, make diet changes, such as: ? Avoiding caffeine. ? Drinking fewer fluids, especially alcohol. ? Not drinking in the evening. ? Avoiding foods or drinks that may irritate the bladder. These include coffee, tea, soda, artificial sweeteners, citrus, tomato-based foods, and chocolate. ? Eating foods that help prevent or ease constipation. Constipation can make this condition worse. Your health care provider may recommend that you:  Drink enough fluid to keep your urine pale yellow.  Take over-the-counter or prescription medicines.  Eat foods that are high in fiber, such as beans, whole grains, and fresh  fruits and vegetables.  Limit foods that are high in fat and processed sugars, such as fried or sweet foods. General instructions  Take over-the-counter and prescription medicines only as told by your health care provider.  Keep all follow-up visits as told by your health care provider. This is important. Contact a health care provider if:  You start urinating more often.  You feel pain or irritation when you urinate.  You notice blood in your urine.  Your urine looks cloudy.  You develop a fever.  You begin vomiting. Get help right away if:  You are unable to urinate. Summary  Urinary frequency means urinating more often than usual. With urinary frequency, you may urinate every 1-2 hours even though you drink a normal amount of fluid and do not have a bladder infection or other bladder condition.  Your health care provider may recommend that you keep a bladder diary, follow a bladder training program, or make dietary changes.  If told by your health care provider, do Kegel exercises to strengthen the muscles that help control urination.  Take over-the-counter and prescription medicines only as told by your health care provider.  Contact a health care provider if your symptoms do not improve or get worse. This information is not intended to replace advice given to you by your health care provider. Make sure you discuss any questions you have with your health care provider. Document Revised: 06/11/2018 Document Reviewed: 06/11/2018 Elsevier Patient Education  2020 [employer]vier Inc.  

## 2020-10-24 LAB — CERVICOVAGINAL ANCILLARY ONLY
Bacterial Vaginitis (gardnerella): NEGATIVE
Candida Glabrata: NEGATIVE
Candida Vaginitis: NEGATIVE
Chlamydia: NEGATIVE
Comment: NEGATIVE
Comment: NEGATIVE
Comment: NEGATIVE
Comment: NEGATIVE
Comment: NEGATIVE
Comment: NORMAL
Neisseria Gonorrhea: NEGATIVE
Trichomonas: NEGATIVE

## 2020-10-24 LAB — URINALYSIS, MICROSCOPIC ONLY
Bacteria, UA: NONE SEEN /HPF
Hyaline Cast: NONE SEEN /LPF
RBC / HPF: NONE SEEN /HPF (ref 0–2)
Squamous Epithelial / HPF: NONE SEEN /HPF (ref <=5)
WBC, UA: NONE SEEN /HPF (ref 0–5)

## 2020-10-24 LAB — URINE CULTURE
MICRO NUMBER:: 11174465
SPECIMEN QUALITY:: ADEQUATE

## 2020-10-27 ENCOUNTER — Other Ambulatory Visit: Payer: Self-pay

## 2020-10-27 ENCOUNTER — Ambulatory Visit (INDEPENDENT_AMBULATORY_CARE_PROVIDER_SITE_OTHER): Payer: 59 | Admitting: Obstetrics and Gynecology

## 2020-10-27 ENCOUNTER — Telehealth: Payer: Self-pay

## 2020-10-27 ENCOUNTER — Encounter: Payer: Self-pay | Admitting: Obstetrics and Gynecology

## 2020-10-27 VITALS — BP 139/83 | HR 86 | Ht 66.0 in | Wt 266.5 lb

## 2020-10-27 DIAGNOSIS — R399 Unspecified symptoms and signs involving the genitourinary system: Secondary | ICD-10-CM | POA: Diagnosis not present

## 2020-10-27 DIAGNOSIS — D251 Intramural leiomyoma of uterus: Secondary | ICD-10-CM | POA: Diagnosis not present

## 2020-10-27 NOTE — Telephone Encounter (Signed)
Pt was checking out and she was wanting to know since she couldn't get a visit with an u/s and visit back to back if she can do a follow up via televisit? I told the pt I will send a message and ask the provider. The pt doesn't want to take off 2 days for work. I told the pt I understand and I will be in touch once I hear back from the provider.

## 2020-10-27 NOTE — Telephone Encounter (Signed)
Please advise. Thanks Jaciel Diem 

## 2020-10-27 NOTE — Progress Notes (Signed)
Pt present due to having freq urination. Pt stated that she has not noticed any pain with urination or urine type symptoms with an UTI pt think the freq urination is coming from the Fibroids.

## 2020-10-28 ENCOUNTER — Encounter: Payer: Self-pay | Admitting: Obstetrics and Gynecology

## 2020-10-28 NOTE — Progress Notes (Signed)
GYNECOLOGY PROGRESS NOTE  Subjective:    Patient ID: Lori Oliver, female    DOB: Sep 24, 1971, 49 y.o.   MRN: [id number]  HPI  Patient is a 49 y.o. G1P1 female with a history of fibroid uterus who presents for complaints of urinary frequency and nocturia for the past several (2-3) weeks with associated suprapubic pain and pelvic pressure.  She states that she was seen by her PCP yesterday for her complaints and had her urine tested, with no evidence of a UTI.  She notes that her symptoms only mildly improve with use of Azo, but are still bothersome. Was prescribed an antibiotic to take prn when symptoms occurred, and was instructed to f/u with GYN as etiology could possibly be related to her fibroids.    The following portions of the patient's history were reviewed and updated as appropriate: allergies, current medications, past family history, past medical history, past social history, past surgical history and problem list.  Review of Systems Pertinent items noted in HPI and remainder of comprehensive ROS otherwise negative.   Objective:   Blood pressure 139/83, pulse 86, height 5\' 6"  (1.676 m), weight 266 lb 8 oz (120.9 kg), last menstrual period 09/09/2020. General appearance: alert and no distress Abdomen: soft, non-tender; bowel sounds normal; no masses,  no organomegaly Pelvic: deferred   Labs:  Office Visit on 10/23/2020  Component Date Value Ref Range Status  . Color, UA 10/23/2020 gold   Final  . Clarity, UA 10/23/2020 clear   Final  . Glucose, UA 10/23/2020 Negative  Negative Final  . Bilirubin, UA 10/23/2020 negative   Final  . Ketones, UA 10/23/2020 negative   Final  . Spec Grav, UA 10/23/2020 1.010  1.010 - 1.025 Final  . Blood, UA 10/23/2020 negative   Final  . pH, UA 10/23/2020 6.0  5.0 - 8.0 Final  . Protein, UA 10/23/2020 Negative  Negative Final  . Urobilinogen, UA 10/23/2020 negative* 0.2 or 1.0 E.U./dL Final  . Nitrite, UA 10/23/2020 negative   Final  .  Leukocytes, UA 10/23/2020 Negative  Negative Final  . Appearance 10/23/2020 clear   Final  . Odor 10/23/2020 none   Final  . WBC, UA 10/23/2020 NONE SEEN  0 - 5 /HPF Final  . RBC / HPF 10/23/2020 NONE SEEN  0 - 2 /HPF Final  . Squamous Epithelial / LPF 10/23/2020 NONE SEEN  < OR = 5 /HPF Final  . Bacteria, UA 10/23/2020 NONE SEEN  NONE SEEN /HPF Final  . Hyaline Cast 10/23/2020 NONE SEEN  NONE SEEN /LPF Final  . MICRO NUMBER: 10/23/2020 [id number]   Final  . SPECIMEN QUALITY: 10/23/2020 Adequate   Final  . Sample Source 10/23/2020 URINE   Final  . STATUS: 10/23/2020 FINAL   Final  . ISOLATE 1: 10/23/2020 Less than 10,000 CFU/mL of single Gram positive organism isolated. No further testing will be performed. If clinically indicated, recollection using a method to minimize contamination, with prompt transfer to Urine Culture Transport Tube, is recommended.   Final  . Neisseria Gonorrhea 10/23/2020 Negative   Final  . Chlamydia 10/23/2020 Negative   Final  . Trichomonas 10/23/2020 Negative   Final  . Bacterial Vaginitis (gardnerella) 10/23/2020 Negative   Final  . Candida Vaginitis 10/23/2020 Negative   Final  . Candida Glabrata 10/23/2020 Negative   Final  . Comment 10/23/2020 Normal Reference Range Candida Species - Negative   Final  . Comment 10/23/2020 Normal Reference Range Candida Galbrata - Negative  Final  . Comment 10/23/2020 Normal Reference Range Trichomonas - Negative   Final  . Comment 10/23/2020 Normal Reference Ranger Chlamydia - Negative   Final  . Comment 10/23/2020 Normal Reference Range Neisseria Gonorrhea - Negative   Final  . Comment 10/23/2020 Normal Reference Range Bacterial Vaginosis - Negative   Final    Imaging:  Patient Name: Lori Oliver DOB: May 20, 1971 MRN: [id number] ULTRASOUND REPORT  Location: [facility]  Date of Service: 10/06/2019     Indications:Enlarged Uterus Findings:  The uterus is anteverted and measures 12 x 7.9 x 9.0 cm Echo  texture is heterogenous with evidence of focal masses. Within the uterus are multiple suspected fibroids measuring: Fibroid 1:Anterior  IM 3.8 x 3.0 x 3.4 cm Fibroid 2:Anterior Lt IM 2.5 x 3.8 x 3.4 cm Fibroid 3: Posterior IM 7.4 x 6.1 x 6.2 cm  The Endometrium measures 10 mm.  Right Ovary measures 2.7 x 1.4 x 2.7  cm. It is normal in appearance. Left Ovary measures 3.4 x 1.7 x 3.2 cm. It is normal in appearance. Survey of the adnexa demonstrates no adnexal masses. There is no free fluid in the cul de sac.  Impression: 1. Enlarge fibroid uterus as described above.  Recommendations: 1.Clinical correlation with the patient's History and Physical Exam.   Jenine M. Albertine Grates    RDMS  The ultrasound images and findings were reviewed by me and I agree with the above report.  Finis Bud, M.D. 10/13/2019 4:03 PM   Assessment:   1. UTI symptoms   2. Intramural leiomyoma of uterus    Plan:   1. UTI symptoms  - Ongoing for several weeks, with negative recent UA and culture.  Notes mild improvement with increasing fluids and use of Azo. Discussed differential diagnosis including overactive bladder, pressure symptoms from fibroid, other cystitis.   2. Intramural fibroids  - Discussed possibility of uterine enlargement due to fibroids since last imaging performed last year as cause of new onset of urinary symptoms.  Would recommend repeating ultrasound to assess.  Discussed management options of fibroids if they have enlarged, including hormonal management (Oriahnn/Myfembre, Depot Lupron, Depo Provera), surgical interventions such as UFE or hysterectomy.  Patient reports that if larger she would consider surgical intervention. Given handouts on both surgical options.  If same size, thinks she may still consider surgical management if her symptoms do not abate with time.   Follow up in 1 week for ultrasound and f/u with MD.    A total of 15 minutes were spent face-to-face with  the patient during this encounter and over half of that time dealt with counseling and coordination of care.   Rubie Maid, MD [facility]

## 2020-10-30 NOTE — Telephone Encounter (Signed)
Completed and pt is aware.

## 2020-10-30 NOTE — Telephone Encounter (Signed)
Could she not have her ultrasound at 11:00 and see me after?  I have an 11:30 slot available.

## 2020-11-01 ENCOUNTER — Other Ambulatory Visit: Payer: Self-pay

## 2020-11-01 ENCOUNTER — Ambulatory Visit (INDEPENDENT_AMBULATORY_CARE_PROVIDER_SITE_OTHER): Payer: 59

## 2020-11-01 ENCOUNTER — Ambulatory Visit (INDEPENDENT_AMBULATORY_CARE_PROVIDER_SITE_OTHER): Payer: 59 | Admitting: Obstetrics and Gynecology

## 2020-11-01 ENCOUNTER — Encounter: Payer: Self-pay | Admitting: Obstetrics and Gynecology

## 2020-11-01 VITALS — BP 145/86 | HR 72 | Ht 66.0 in | Wt 263.8 lb

## 2020-11-01 DIAGNOSIS — D251 Intramural leiomyoma of uterus: Secondary | ICD-10-CM

## 2020-11-01 DIAGNOSIS — N3289 Other specified disorders of bladder: Secondary | ICD-10-CM

## 2020-11-01 NOTE — Progress Notes (Signed)
GYNECOLOGY PROGRESS NOTE  Subjective:    Patient ID: Lori Oliver, female    DOB: 1971/12/11, 49 y.o.   MRN: [id number]  HPI  Patient is a 49 y.o. G1P1 female who presents for follow up of ultrasound results for known fibroid uterus and new onset of urinary symptoms. She states that since her last visit last week, her urinary symptoms have started to improve somewhat.  Desires to discuss options again for her fibroids.   The following portions of the patient's history were reviewed and updated as appropriate: allergies, current medications, past family history, past medical history, past social history, past surgical history and problem list.  Review of Systems Pertinent items noted in HPI and remainder of comprehensive ROS otherwise negative.   Objective:   Blood pressure (!) 145/86, pulse 72, height 5\' 6"  (1.676 m), weight 263 lb 12.8 oz (119.7 kg), last menstrual period 09/09/2020. General appearance: alert and no distress Exam deferred.     Imaging:  Patient Name: Lori Oliver DOB: 1971/01/29 MRN: [id number] ULTRASOUND REPORT  Location: [facility]  Date of Service: 11/01/2020     Indications:Enlarged Uterus   Findings:  The uterus is anteverted and measures 15.6 x 5.8 x 6.8 cm. Echo texture is heterogenous with evidence of focal masses. Within the uterus are multiple suspected fibroids measuring: Fibroid 1:Anterior IM 1.9 x 1.8 x 2.0 cm Slightly smaller from prior ultrasound. Fibroid 2:Anterior IM 2.4 x 1.9 x 2.1 cm Slightly smaller from prior ultrasound. Fibroid 3: Posterior IM 5.9 x 5.8 x 5.4 cm. Slightly smaller from prior ultrasound. Fibroid 4 Fundal SS 3.6 x 3.0 x 3.3 cm. New The Endometrium measures 5 mm.  Right Ovary measures 3.4 x 2.4 x 2.1  cm. It is normal in appearance. Left Ovary measures 1.6 x 1.3 x 1.4 cm. It is normal in appearance. Survey of the adnexa demonstrates no adnexal masses. There is no free fluid in the cul de  sac.  Impression: 1. Fibroids appear to be slightly small from prior ultrasound. Recommendations: 1.Clinical correlation with the patient's History and Physical Exam.   Jenine M. [place]    Patient Name: Lori Oliver DOB: 1971/04/28 MRN: [id number] ULTRASOUND REPORT  Location: [facility]  Date of Service: 10/06/2019     Indications:Enlarged Uterus Findings:  The uterus is anteverted and measures 12 x 7.9 x 9.0 cm Echo texture is heterogenous with evidence of focal masses. Within the uterus are multiple suspected fibroids measuring: Fibroid 1:Anterior  IM 3.8 x 3.0 x 3.4 cm Fibroid 2:Anterior Lt IM 2.5 x 3.8 x 3.4 cm Fibroid 3: Posterior IM 7.4 x 6.1 x 6.2 cm  The Endometrium measures 10 mm.  Right Ovary measures 2.7 x 1.4 x 2.7  cm. It is normal in appearance. Left Ovary measures 3.4 x 1.7 x 3.2 cm. It is normal in appearance. Survey of the adnexa demonstrates no adnexal masses. There is no free fluid in the cul de sac.  Impression: 1. Enlarge fibroid uterus as described above.  Recommendations: 1.Clinical correlation with the patient's History and Physical Exam.   Jenine M. Albertine Grates    RDMS  The ultrasound images and findings were reviewed by me and I agree with the above report.  Finis Bud, M.D. 10/13/2019 4:03 PM   Assessment:  Intramural leiomyoma of uterus Bladder irritation  Plan:   1. Discussion had regarding management options for fibroid uterus.  Again eviewed management options of fibroids if they have  enlarged, including hormonal management (Oriahnn/Myfembre, Depot Lupron, Depo Provera) to decrease fibroid volume, or surgical interventions such as UFE or hysterectomy (definitive management). Reviewed risks, benefits, and recovery expectations if surgical options desired. Patient considering hysterectomy, however desires to wait to see if her other symptoms improve first. To f/u if surgical intervention is desired.   2. Bladder irritation improving since visit last week. Notes she has not done anything different. States she was hesitant to take antibiotics given by her PCP, but may consider if symptoms don't continue to improve.  Also given a list of bladder irritants.   Return to clinic for any scheduled appointments or for any gynecologic concerns as needed.    A total of 15 minutes were spent face-to-face with the patient during this encounter and over half of that time dealt with counseling and coordination of care.   Rubie Maid, MD [facility]

## 2020-11-01 NOTE — Progress Notes (Signed)
Pt present for ultrasound results. Pt stated that she was doing well no problems.

## 2020-11-04 ENCOUNTER — Encounter: Payer: Self-pay | Admitting: Obstetrics and Gynecology

## 2020-11-20 ENCOUNTER — Ambulatory Visit: Payer: 59 | Admitting: Internal Medicine

## 2021-01-01 ENCOUNTER — Encounter: Payer: 59 | Admitting: Family Medicine

## 2021-01-02 ENCOUNTER — Other Ambulatory Visit: Payer: Self-pay | Admitting: Family Medicine

## 2021-01-03 ENCOUNTER — Ambulatory Visit (INDEPENDENT_AMBULATORY_CARE_PROVIDER_SITE_OTHER): Payer: 59 | Admitting: Obstetrics and Gynecology

## 2021-01-03 ENCOUNTER — Other Ambulatory Visit: Payer: Self-pay

## 2021-01-03 ENCOUNTER — Encounter: Payer: Self-pay | Admitting: Obstetrics and Gynecology

## 2021-01-03 VITALS — BP 142/97 | HR 89 | Ht 66.0 in | Wt 263.2 lb

## 2021-01-03 DIAGNOSIS — Z30017 Encounter for initial prescription of implantable subdermal contraceptive: Secondary | ICD-10-CM

## 2021-01-03 DIAGNOSIS — N915 Oligomenorrhea, unspecified: Secondary | ICD-10-CM

## 2021-01-03 DIAGNOSIS — I4891 Unspecified atrial fibrillation: Secondary | ICD-10-CM | POA: Diagnosis not present

## 2021-01-03 DIAGNOSIS — E785 Hyperlipidemia, unspecified: Secondary | ICD-10-CM | POA: Diagnosis not present

## 2021-01-03 DIAGNOSIS — Z975 Presence of (intrauterine) contraceptive device: Secondary | ICD-10-CM | POA: Insufficient documentation

## 2021-01-03 DIAGNOSIS — Z3009 Encounter for other general counseling and advice on contraception: Secondary | ICD-10-CM | POA: Diagnosis not present

## 2021-01-03 DIAGNOSIS — I10 Essential (primary) hypertension: Secondary | ICD-10-CM

## 2021-01-03 DIAGNOSIS — Z6841 Body Mass Index (BMI) 40.0 and over, adult: Secondary | ICD-10-CM

## 2021-01-03 LAB — POCT URINE PREGNANCY: Preg Test, Ur: NEGATIVE

## 2021-01-03 NOTE — Progress Notes (Signed)
   GYNECOLOGY CLINIC PROGRESS NOTE Subjective:    Lori Oliver is a 50 y.o. G1P1 female with a history of oligomenorrhea and fibroid uterus who presents for contraception counseling. The patient has no complaints today. The patient is sexually active. Pertinent past medical history: hypertension, A-fib, hyperlipidemia, morbid obesity.  Patient's last menstrual period was 09/09/2020.  Was mostly spotting.    The following portions of the patient's history were reviewed and updated as appropriate: allergies, current medications, past family history, past medical history, past social history, past surgical history and problem list.  Review of Systems Pertinent items noted in HPI and remainder of comprehensive ROS otherwise negative.   Objective:    BP (!) 142/97   Pulse 89   Ht 5\' 6"  (1.676 m)   Wt 263 lb 3.2 oz (119.4 kg)   LMP 09/09/2020 Comment: Dec 2-5, 2021 spotting really light  BMI 42.48 kg/m  General appearance: alert and no distress Remainder of exam deferred.    Assessment:    1. Nexplanon insertion   2. Encounter for counseling regarding contraception   3. Essential hypertension   4. Hyperlipidemia, unspecified hyperlipidemia type   5. Atrial fibrillation with RVR (HCC)   6. Class 3 severe obesity due to excess calories with serious comorbidity and body mass index (BMI) of 40.0 to 44.9 in adult ([place])   7. Hypomenorrhea/oligomenorrhea     Plan:    - Pregnancy test, result: negative.    - Reviewed all forms of birth control options available based on her medical comorbidities including abstinence;over the counter/barrier methods; hormonal contraceptive medication including pill (progesterone-only), vaginal barrier gel; injection,contraceptive implant; hormonal and nonhormonal IUDs; permanent sterilization options including vasectomy and the various tubal sterilization modalities. Risks and benefits reviewed.  Questions were answered.  Patient desires Nexplanon today.  Will insert (see insertion note below).      GYNECOLOGY OFFICE PROCEDURE NOTE  Lori Oliver is a 50 y.o. G1P1 here for Nexplanon insertion.  Last pap smear was on 12/05/2019 and was normal.  No other gynecologic concerns.  Nexplanon Insertion Procedure Patient identified, informed consent performed, consent signed.   Patient does understand that irregular bleeding is a very common side effect of this medication. She was advised to have backup contraception for one week after placement. Pregnancy test in clinic today was negative.  Appropriate time out taken.  Patient's left arm was prepped and draped in the usual sterile fashion. The ruler used to measure and mark insertion area.  Patient was prepped with alcohol swab and then injected with 3 ml of 1% lidocaine.  She was prepped with betadine, Nexplanon removed from packaging,  Device confirmed in needle, then inserted full length of needle and withdrawn per handbook instructions. Nexplanon was able to palpated in the patient's arm; patient palpated the insert herself. There was minimal blood loss.  Patient insertion site covered with guaze and a pressure bandage to reduce any bruising.  The patient tolerated the procedure well and was given post procedure instructions.    Exp: 03/15/2023 Lot: [id number]   Rubie Maid, MD [facility]

## 2021-01-03 NOTE — Progress Notes (Signed)
Pt present to discuss birth control options. Pt stated that she was doing well no problems. UPT-NEG.

## 2021-01-03 NOTE — Patient Instructions (Signed)

## 2021-01-29 ENCOUNTER — Encounter: Payer: 59 | Admitting: Family Medicine

## 2021-02-12 ENCOUNTER — Telehealth: Payer: Self-pay | Admitting: Family Medicine

## 2021-02-12 ENCOUNTER — Other Ambulatory Visit: Payer: Self-pay | Admitting: Family Medicine

## 2021-02-12 MED ORDER — FLUCONAZOLE 150 MG PO TABS: 150.0000 mg | ORAL_TABLET | ORAL | 0 refills | Status: DC

## 2021-02-12 NOTE — Telephone Encounter (Signed)
Pt does not want to make an appt . Pt has an appt for cpe on 02-19-2021. Pt is having white vaginal discharge and itching for about 4 days and would diflucan send to [facility] in [place] phone number [phone]

## 2021-02-12 NOTE — Telephone Encounter (Signed)
Pt is calling to follow up on the request for medication of possible yeast infection. Please advise [phone]

## 2021-02-13 NOTE — Telephone Encounter (Signed)
Left pt detailed VM med called in

## 2021-02-19 ENCOUNTER — Encounter: Payer: 59 | Admitting: Family Medicine

## 2021-03-19 ENCOUNTER — Encounter: Payer: Self-pay | Admitting: Family Medicine

## 2021-03-19 ENCOUNTER — Ambulatory Visit (INDEPENDENT_AMBULATORY_CARE_PROVIDER_SITE_OTHER): Payer: Managed Care, Other (non HMO) | Admitting: Family Medicine

## 2021-03-19 ENCOUNTER — Other Ambulatory Visit: Payer: Self-pay

## 2021-03-19 VITALS — BP 120/80 | HR 97 | Temp 98.0°F | Resp 18 | Ht 66.0 in | Wt 258.8 lb

## 2021-03-19 DIAGNOSIS — R7303 Prediabetes: Secondary | ICD-10-CM | POA: Diagnosis not present

## 2021-03-19 DIAGNOSIS — Z1231 Encounter for screening mammogram for malignant neoplasm of breast: Secondary | ICD-10-CM

## 2021-03-19 DIAGNOSIS — Z1211 Encounter for screening for malignant neoplasm of colon: Secondary | ICD-10-CM

## 2021-03-19 DIAGNOSIS — I10 Essential (primary) hypertension: Secondary | ICD-10-CM | POA: Diagnosis not present

## 2021-03-19 DIAGNOSIS — Z01419 Encounter for gynecological examination (general) (routine) without abnormal findings: Secondary | ICD-10-CM | POA: Diagnosis not present

## 2021-03-19 DIAGNOSIS — Z114 Encounter for screening for human immunodeficiency virus [HIV]: Secondary | ICD-10-CM

## 2021-03-19 DIAGNOSIS — I509 Heart failure, unspecified: Secondary | ICD-10-CM

## 2021-03-19 DIAGNOSIS — Z6841 Body Mass Index (BMI) 40.0 and over, adult: Secondary | ICD-10-CM

## 2021-03-19 DIAGNOSIS — E785 Hyperlipidemia, unspecified: Secondary | ICD-10-CM

## 2021-03-19 DIAGNOSIS — Z1159 Encounter for screening for other viral diseases: Secondary | ICD-10-CM

## 2021-03-19 DIAGNOSIS — E559 Vitamin D deficiency, unspecified: Secondary | ICD-10-CM

## 2021-03-19 HISTORY — DX: Heart failure, unspecified: I50.9

## 2021-03-19 MED ORDER — VITAMIN D (ERGOCALCIFEROL) 1.25 MG (50000 UNIT) PO CAPS: 50000.0000 [IU] | ORAL_CAPSULE | ORAL | 0 refills | Status: DC

## 2021-03-19 NOTE — Patient Instructions (Addendum)
[facility] [place],  [place]  [address] Get Driving Directions Main: [phone]   Preventive Care 74-49 Years Old, Female Preventive care refers to lifestyle choices and visits with your health care provider that can promote health and wellness. This includes:  A yearly physical exam. This is also called an annual wellness visit.  Regular dental and eye exams.  Immunizations.  Screening for certain conditions.  Healthy lifestyle choices, such as: ? Eating a healthy diet. ? Getting regular exercise. ? Not using drugs or products that contain nicotine and tobacco. ? Limiting alcohol use. What can I expect for my preventive care visit? Physical exam Your health care provider will check your:  Height and weight. These may be used to calculate your BMI (body mass index). BMI is a measurement that tells if you are at a healthy weight.  Heart rate and blood pressure.  Body temperature.  Skin for abnormal spots. Counseling Your health care provider may ask you questions about your:  Past medical problems.  Family's medical history.  Alcohol, tobacco, and drug use.  Emotional well-being.  Home life and relationship well-being.  Sexual activity.  Diet, exercise, and sleep habits.  Work and work Statistician.  Access to firearms.  Method of birth control.  Menstrual cycle.  Pregnancy history. What immunizations do I need? Vaccines are usually given at various ages, according to a schedule. Your health care provider will recommend vaccines for you based on your age, medical history, and lifestyle or other factors, such as travel or where you work.   What tests do I need? Blood tests  Lipid and cholesterol levels. These may be checked every 5 years, or more often if you are over 24 years old.  Hepatitis C test.  Hepatitis B test. Screening  Lung cancer screening. You may have this screening every year  starting at age 49 if you have a 30-pack-year history of smoking and currently smoke or have quit within the past 15 years.  Colorectal cancer screening. ? All adults should have this screening starting at age 49 and continuing until age 14. ? Your health care provider may recommend screening at age 49 if you are at increased risk. ? You will have tests every 1-10 years, depending on your results and the type of screening test.  Diabetes screening. ? This is done by checking your blood sugar (glucose) after you have not eaten for a while (fasting). ? You may have this done every 1-3 years.  Mammogram. ? This may be done every 1-2 years. ? Talk with your health care provider about when you should start having regular mammograms. This may depend on whether you have a family history of breast cancer.  BRCA-related cancer screening. This may be done if you have a family history of breast, ovarian, tubal, or peritoneal cancers.  Pelvic exam and Pap test. ? This may be done every 3 years starting at age 49. ? Starting at age 26, this may be done every 5 years if you have a Pap test in combination with an HPV test. Other tests  STD (sexually transmitted disease) testing, if you are at risk.  Bone density scan. This is done to screen for osteoporosis. You may have this scan if you are at high risk for osteoporosis. Talk with your health care provider about your test results, treatment options, and if necessary, the need for more tests. Follow these instructions at home: Eating and drinking  Eat a diet that includes fresh fruits and vegetables, whole grains, lean protein, and low-fat dairy products.  Take vitamin and mineral supplements as recommended by your health care provider.  Do not drink alcohol if: ? Your health care provider tells you not to drink. ? You are pregnant, may be pregnant, or are planning to become pregnant.  If you drink alcohol: ? Limit how much you have to 0-1  drink a day. ? Be aware of how much alcohol is in your drink. In the [place] one drink equals one 12 oz bottle of beer (355 mL), one 5 oz glass of wine (148 mL), or one 1 oz glass of hard liquor (44 mL).   Lifestyle  Take daily care of your teeth and gums. Brush your teeth every morning and night with fluoride toothpaste. Floss one time each day.  Stay active. Exercise for at least 30 minutes 5 or more days each week.  Do not use any products that contain nicotine or tobacco, such as cigarettes, e-cigarettes, and chewing tobacco. If you need help quitting, ask your health care provider.  Do not use drugs.  If you are sexually active, practice safe sex. Use a condom or other form of protection to prevent STIs (sexually transmitted infections).  If you do not wish to become pregnant, use a form of birth control. If you plan to become pregnant, see your health care provider for a prepregnancy visit.  If told by your health care provider, take low-dose aspirin daily starting at age 49.  Find healthy ways to cope with stress, such as: ? Meditation, yoga, or listening to music. ? Journaling. ? Talking to a trusted person. ? Spending time with friends and family. Safety  Always wear your seat belt while driving or riding in a vehicle.  Do not drive: ? If you have been drinking alcohol. Do not ride with someone who has been drinking. ? When you are tired or distracted. ? While texting.  Wear a helmet and other protective equipment during sports activities.  If you have firearms in your house, make sure you follow all gun safety procedures. What's next?  Visit your health care provider once a year for an annual wellness visit.  Ask your health care provider how often you should have your eyes and teeth checked.  Stay up to date on all vaccines. This information is not intended to replace advice given to you by your health care provider. Make sure you discuss any questions you have  with your health care provider. Document Revised: 09/05/2020 Document Reviewed: 08/13/2018 Elsevier Patient Education  2021 [employer].

## 2021-03-19 NOTE — Progress Notes (Addendum)
Patient: Lori Oliver, Female    DOB: 08/19/1971, 50 y.o.   MRN: [id number] [name], PA-C Visit Date: 03/19/2021  Today's Provider: [name], PA-C   Chief Complaint  Patient presents with  . Annual Exam   Subjective:   Annual physical exam:  Lori Oliver is a 50 y.o. female who presents today for complete physical exam:  Exercise/Activity:3x a week for 60 min Diet/nutrition:  Working on [employer], loosing weight Sleep:  Sleeps well  Wants to loose more weight and work on health Wt Readings from Last 5 Encounters:  03/19/21 258 lb 12.8 oz (117.4 kg)  01/03/21 263 lb 3.2 oz (119.4 kg)  11/01/20 263 lb 12.8 oz (119.7 kg)  10/27/20 266 lb 8 oz (120.9 kg)  10/23/20 261 lb 3.2 oz (118.5 kg)   BMI Readings from Last 5 Encounters:  03/19/21 41.77 kg/m  01/03/21 42.48 kg/m  11/01/20 42.58 kg/m  10/27/20 43.01 kg/m  10/23/20 42.16 kg/m     USPSTF grade A and B recommendations - reviewed and addressed today  Depression:  Phq 9 completed today by patient, was reviewed by me with patient in the room PHQ score is neg, pt feels good PHQ 2/9 Scores 03/19/2021 10/23/2020 07/21/2020 07/17/2020  PHQ - 2 Score 0 '3 2 2  ' PHQ- 9 Score 0 - 6 6  Exception Documentation - (No Data) - -   Depression screen [facility] 2/9 03/19/2021 10/23/2020 07/21/2020 07/17/2020 04/24/2020  Decreased Interest 0 '3 1 1 ' 0  Down, Depressed, Hopeless 0 - '1 1 1  ' PHQ - 2 Score 0 '3 2 2 1  ' Altered sleeping 0 - 1 1 0  Tired, decreased energy 0 - 1 1 0  Change in appetite 0 - 1 1 0  Feeling bad or failure about yourself  0 - 0 0 0  Trouble concentrating 0 - 1 1 0  Moving slowly or fidgety/restless 0 - 0 0 0  Suicidal thoughts 0 - 0 0 0  PHQ-9 Score 0 - '6 6 1  ' Difficult doing work/chores Not difficult at all - Not difficult at all Not difficult at all Not difficult at all  Some recent data might be hidden    Alcohol screening: [place] Office Visit from 03/19/2021 in [facility]  AUDIT-C Score 0      Immunizations and Health Maintenance: Health Maintenance  Topic Date Due  . Hepatitis C Screening  Never done  . COLONOSCOPY (Pts 45-30yr Insurance coverage will need to be confirmed)  Never done  . MAMMOGRAM  12/23/2020  . INFLUENZA VACCINE  07/16/2021  . PAP-Cervical Cytology Screening  12/05/2022  . PAP SMEAR-Modifier  12/05/2022  . TETANUS/TDAP  11/30/2028  . COVID-19 Vaccine  Completed  . HIV Screening  Completed  . HPV VACCINES  Aged Out     Hep C Screening: due  STD testing and prevention (HIV/chl/gon/syphilis):  see above, no additional testing desired by pt today-  Not sexually active no new exposures or partners  Intimate partner violence: safe at home, lives with granddaughter  Sexual History/Pain during Intercourse: widowed, no concerns, not currently sexually active  Menstrual History/LMP/Abnormal Bleeding:  Irregular- perimenopausal per Dr. CMarcelline MatesPatient's last menstrual period was 09/09/2020.  Incontinence Symptoms: none  Breast cancer: due ordered Last Mammogram: *see HM list above BRCA gene screening:  No family hx  Cervical cancer screening: UTD - due in 2023 Pt denies family hx of cancers - breast, ovarian, uterine, colon:  Osteoporosis:   Discussion on osteoporosis per age, including high calcium and vitamin D supplementation, weight bearing exercises  Skin cancer:  Hx of skin CA -  NO Discussed atypical lesions   Colorectal cancer:   Colonoscopy is due   Discussed concerning signs and sx of CRC, pt denies melena, hematochezia change in bowels  Lung cancer:   Low Dose CT Chest recommended if Age 86-80 years, 30 pack-year currently smoking OR have quit w/in 15years. Patient does not qualify.    Social History   Tobacco Use  . Smoking status: Never Smoker  . Smokeless tobacco: Never Used  Vaping Use  . Vaping Use: Never used  Substance Use Topics  . Alcohol use: Not Currently    Alcohol/week: 0.0  standard drinks    Comment: occ  . Drug use: No     Flowsheet Row Office Visit from 03/19/2021 in [facility]  AUDIT-C Score 0      Family History  Problem Relation Age of Onset  . Hypertension Mother   . Heart disease Mother   . Arthritis Mother   . Depression Mother   . Stroke Maternal Grandmother      Blood pressure/Hypertension: BP Readings from Last 3 Encounters:  03/19/21 120/80  01/03/21 (!) 142/97  11/01/20 (!) 145/86    Weight/Obesity: Wt Readings from Last 3 Encounters:  03/19/21 258 lb 12.8 oz (117.4 kg)  01/03/21 263 lb 3.2 oz (119.4 kg)  11/01/20 263 lb 12.8 oz (119.7 kg)   BMI Readings from Last 3 Encounters:  03/19/21 41.77 kg/m  01/03/21 42.48 kg/m  11/01/20 42.58 kg/m     Lipids:  Lab Results  Component Value Date   CHOL 164 12/06/2019   CHOL 162 12/14/2018   CHOL 177 07/07/2017   Lab Results  Component Value Date   HDL 47 (L) 12/06/2019   HDL 46 (L) 12/14/2018   HDL 49 07/07/2017   Lab Results  Component Value Date   LDLCALC 97 12/06/2019   LDLCALC 100 (H) 12/14/2018   LDLCALC 101 (H) 07/07/2017   Lab Results  Component Value Date   TRIG 108 12/06/2019   TRIG 69 12/14/2018   TRIG 134 07/07/2017   Lab Results  Component Value Date   CHOLHDL 3.5 12/06/2019   CHOLHDL 3.5 12/14/2018   CHOLHDL 3.6 07/07/2017   No results found for: LDLDIRECT Based on the results of lipid panel his/her cardiovascular risk factor ( using [place] )  in the next 10 years is: The 10-year ASCVD risk score Mikey Bussing DC Brooke Bonito., et al., 2013) is: 2.6%   Values used to calculate the score:     Age: 88 years     Sex: Female     Is Non-Hispanic African American: Yes     Diabetic: No     Tobacco smoker: No     Systolic Blood Pressure: 127 mmHg     Is BP treated: Yes     HDL Cholesterol: 47 mg/dL     Total Cholesterol: 164 mg/dL Glucose:  Glucose  Date Value Ref Range Status  03/09/2014 86 65 - 99 mg/dL Final   Glucose, Bld   Date Value Ref Range Status  12/06/2019 65 65 - 99 mg/dL Final    Comment:    .            Fasting reference interval .   10/03/2019 107 (H) 70 - 99 mg/dL Final  08/23/2019 124 (H) 70 - 99 mg/dL Final  Glucose-Capillary  Date Value Ref Range Status  03/14/2019 116 (H) 70 - 99 mg/dL Final  12/01/2017 126 (H) 65 - 99 mg/dL Final   Hypertension: BP Readings from Last 3 Encounters:  03/19/21 120/80  01/03/21 (!) 142/97  11/01/20 (!) 145/86   Obesity: Wt Readings from Last 3 Encounters:  03/19/21 258 lb 12.8 oz (117.4 kg)  01/03/21 263 lb 3.2 oz (119.4 kg)  11/01/20 263 lb 12.8 oz (119.7 kg)   BMI Readings from Last 3 Encounters:  03/19/21 41.77 kg/m  01/03/21 42.48 kg/m  11/01/20 42.58 kg/m      Advanced Care Planning:  A voluntary discussion about advance care planning including the explanation and discussion of advance directives.   Discussed health care proxy and Living will, and the patient was able to identify a health care proxy as her daughter Abelardo Diesel.   Patient does not have a living will at present time.   Social History      She        Social History   Socioeconomic History  . Marital status: Widowed    Spouse name: Coralyn Mark  . Number of children: 1  . Years of education: 80  . Highest education level: Some college, no degree  Occupational History  . Not on file  Tobacco Use  . Smoking status: Never Smoker  . Smokeless tobacco: Never Used  Vaping Use  . Vaping Use: Never used  Substance and Sexual Activity  . Alcohol use: Not Currently    Alcohol/week: 0.0 standard drinks    Comment: occ  . Drug use: No  . Sexual activity: Yes    Partners: Male    Birth control/protection: Condom  Other Topics Concern  . Not on file  Social History Narrative   Husband was diagnosed with Cancer this past Summer.   Social Determinants of Health   Financial Resource Strain: Low Risk   . Difficulty of Paying Living Expenses: Not hard at all  Food  Insecurity: No Food Insecurity  . Worried About Charity fundraiser in the Last Year: Never true  . Ran Out of Food in the Last Year: Never true  Transportation Needs: No Transportation Needs  . Lack of Transportation (Medical): No  . Lack of Transportation (Non-Medical): No  Physical Activity: Sufficiently Active  . Days of Exercise per Week: 3 days  . Minutes of Exercise per Session: 60 min  Stress: No Stress Concern Present  . Feeling of Stress : Only a little  Social Connections: Moderately Integrated  . Frequency of Communication with Friends and Family: More than three times a week  . Frequency of Social Gatherings with Friends and Family: Once a week  . Attends Religious Services: 1 to 4 times per year  . Active Member of Clubs or Organizations: Yes  . Attends Archivist Meetings: More than 4 times per year  . Marital Status: Widowed    Family History        Family History  Problem Relation Age of Onset  . Hypertension Mother   . Heart disease Mother   . Arthritis Mother   . Depression Mother   . Stroke Maternal Grandmother     Patient Active Problem List   Diagnosis Date Noted  . Nexplanon in place 01/03/2021  . Prediabetes 07/17/2020  . Hyperlipidemia 07/17/2020  . 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  . Gastroesophageal reflux disease without esophagitis 07/17/2020  . Uterine leiomyoma 07/17/2020  . Atrial tachycardia ([place]) 05/17/2019  . Chest  tightness 05/17/2019  . Intermittent atrial fibrillation ([place]) 02/19/2018  . Hypokalemia 02/10/2018  . Anxiety 01/13/2018  . Atrial fibrillation with RVR ([place]) 12/01/2017  . Vitamin D deficiency 07/08/2017  . Lower back pain 07/07/2017  . Class 3 severe obesity due to excess calories with serious comorbidity and body mass index (BMI) of 40.0 to 44.9 in adult ([place]) 07/07/2017  . Essential hypertension 07/11/2016  . IFG (impaired fasting glucose) 05/16/2016  . 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 Abnormal  thyroid function test 05/16/2016  . Decreased libido 05/16/2016  . Breast lump on left side at 5 o'clock position 05/16/2016  . Bilateral thoracic back pain 06/01/2015  . History of abnormal cervical Pap smear 06/01/2015    Past Surgical History:  Procedure Laterality Date  . BREAST BIOPSY Right 2015   benign  . ESOPHAGOGASTRODUODENOSCOPY (EGD) WITH PROPOFOL N/A 08/12/2018   Procedure: ESOPHAGOGASTRODUODENOSCOPY (EGD) WITH PROPOFOL;  Surgeon: Toledo, Benay Pike, MD;  Location: [facility] ENDOSCOPY;  Service: Gastroenterology;  Laterality: N/A;  . HAMMER TOE SURGERY       Current Outpatient Medications:  .  etonogestrel (NEXPLANON) 68 MG IMPL implant, 1 each by Subdermal route once., Disp: , Rfl:  .  propranolol (INDERAL) 20 MG tablet, Take 20 mg by mouth 3 (three) times daily. As needed, Disp: , Rfl:   No Known Allergies  Patient Care Team: [name], PA-C as PCP - General (Family Medicine) Bary Castilla, Forest Gleason, MD (General Surgery) Ammie Dalton, Okey Regal, MD (Unknown Physician Specialty) Arnetha Courser, MD as Attending Physician (Family Medicine) Ladora Daniel, MD (Bariatrics) [name], Kathlene November, MD as Consulting Physician (Cardiology)  Review of Systems  Constitutional: Negative.  Negative for activity change, appetite change, fatigue and unexpected weight change.  HENT: Negative.   Eyes: Negative.   Respiratory: Negative.  Negative for shortness of breath.   Cardiovascular: Negative.  Negative for chest pain, palpitations and leg swelling.  Gastrointestinal: Negative.  Negative for abdominal pain and blood in stool.  Endocrine: Negative.   Genitourinary: Negative.   Musculoskeletal: Negative.  Negative for arthralgias, gait problem, joint swelling and myalgias.  Skin: Negative.  Negative for pallor and rash.  Allergic/Immunologic: Negative.   Neurological: Negative.  Negative for syncope and weakness.  Hematological: Negative.   Psychiatric/Behavioral: Negative.  Negative for  dysphoric mood, self-injury and suicidal ideas. The patient is not nervous/anxious.   All other systems reviewed and are negative.           Objective:   Vitals:  Vitals:   03/19/21 1130  BP: 120/80  Pulse: 97  Resp: 18  Temp: 98 F (36.7 C)  SpO2: 99%  Weight: 258 lb 12.8 oz (117.4 kg)  Height: '5\' 6"'  (1.676 m)    Body mass index is 41.77 kg/m.  Physical Exam Vitals and nursing note reviewed.  Constitutional:      General: She is not in acute distress.    Appearance: Normal appearance. She is well-developed. She is obese. She is not ill-appearing, toxic-appearing or diaphoretic.     Interventions: Face mask in place.  HENT:     Head: Normocephalic and atraumatic.     Right Ear: External ear normal.     Left Ear: External ear normal.  Eyes:     General: Lids are normal. No scleral icterus.       Right eye: No discharge.        Left eye: No discharge.     Conjunctiva/sclera: Conjunctivae normal.  Neck:  Thyroid: No thyroid mass, thyromegaly or thyroid tenderness.     Trachea: Trachea and phonation normal. No tracheal deviation.  Cardiovascular:     Rate and Rhythm: Normal rate and regular rhythm.     Pulses: Normal pulses.          Radial pulses are 2+ on the right side and 2+ on the left side.       Posterior tibial pulses are 2+ on the right side and 2+ on the left side.     Heart sounds: Normal heart sounds. No murmur heard. No friction rub. No gallop.   Pulmonary:     Effort: Pulmonary effort is normal. No respiratory distress.     Breath sounds: Normal breath sounds. No stridor. No wheezing, rhonchi or rales.  Chest:     Chest wall: No tenderness.  Abdominal:     General: Bowel sounds are normal. There is no distension.     Palpations: Abdomen is soft.  Musculoskeletal:     Cervical back: Normal range of motion and neck supple. No rigidity or tenderness.     Right lower leg: No edema.     Left lower leg: No edema.  Skin:    General: Skin is warm  and dry.     Coloration: Skin is not jaundiced or pale.     Findings: No rash.  Neurological:     Mental Status: She is alert.     Motor: No abnormal muscle tone.     Gait: Gait normal.  Psychiatric:        Mood and Affect: Mood normal.        Speech: Speech normal.        Behavior: Behavior normal.       Fall Risk: Fall Risk  03/19/2021 10/23/2020 07/17/2020 04/24/2020 12/06/2019  Falls in the past year? 0 0 0 1 0  Number falls in past yr: 0 0 0 0 0  Injury with Fall? 0 0 0 1 0  Comment - - - sprain -  Follow up - - - - Falls evaluation completed    Functional Status Survey: Is the patient deaf or have difficulty hearing?: No Does the patient have difficulty seeing, even when wearing glasses/contacts?: No Does the patient have difficulty concentrating, remembering, or making decisions?: No Does the patient have difficulty walking or climbing stairs?: No Does the patient have difficulty dressing or bathing?: No Does the patient have difficulty doing errands alone such as visiting a doctor's office or shopping?: No   Assessment & Plan:    CPE completed today  . USPSTF grade A and B recommendations reviewed with patient; age-appropriate recommendations, preventive care, screening tests, etc discussed and encouraged; healthy living encouraged; see AVS for patient education given to patient  . Discussed importance of 150 minutes of physical activity weekly, AHA exercise recommendations given to pt in AVS/handout  . Discussed importance of healthy diet:  eating lean meats and proteins, avoiding trans fats and saturated fats, avoid simple sugars and excessive carbs in diet, eat 6 servings of fruit/vegetables daily and drink plenty of water and avoid sweet beverages.    . Recommended pt to do annual eye exam and routine dental exams/cleanings  . Depression, alcohol, fall screening completed as documented above and per flowsheets  . Reviewed Health Maintenance: Health Maintenance   Topic Date Due  . Hepatitis C Screening  Never done  . COLONOSCOPY (Pts 45-47yr Insurance coverage will need to be confirmed)  Never done  .  MAMMOGRAM  12/23/2020  . INFLUENZA VACCINE  07/16/2021  . PAP-Cervical Cytology Screening  12/05/2022  . PAP SMEAR-Modifier  12/05/2022  . TETANUS/TDAP  11/30/2028  . COVID-19 Vaccine  Completed  . HIV Screening  Completed  . HPV VACCINES  Aged Out    . Immunizations: Immunization History  Administered Date(s) Administered  . Influenza-Unspecified 09/22/2019  . PFIZER(Purple Top)SARS-COV-2 Vaccination 01/14/2020, 02/04/2020, 10/30/2020  . Tdap 11/30/2018     ICD-10-CM   1. Well woman exam  Z01.419 Lipid panel    COMPLETE METABOLIC PANEL WITH GFR    CBC w/Diff/Platelet    Hemoglobin A1C  2. Class 3 severe obesity due to excess calories with serious comorbidity and body mass index (BMI) of 40.0 to 44.9 in adult (HCC)  E66.01 Hemoglobin A1C   Z68.41    weight going down, labs and screen for metabolic dysfunction, will have her return for f/up with desire to loose weight, reviewed management options - f/up appt  3. Essential hypertension  W65 COMPLETE METABOLIC PANEL WITH GFR   stable, BP at goal today with diet/exercise efforts f  4. Prediabetes  R73.03 Hemoglobin A1C  5. Hyperlipidemia, unspecified hyperlipidemia type  E78.5 Lipid panel   lipids elevated, not on statin, recheck lipids and recalculate ASCVD risk  6. Encounter for hepatitis C screening test for low risk patient  Z11.59 Hepatitis C Antibody  7. Screening for HIV without presence of risk factors  Z11.4 HIV antibody (with reflex)  8. Encounter for screening mammogram for malignant neoplasm of breast  Z12.31 MM 3D SCREEN BREAST BILATERAL  9. Screening for colon cancer  Z12.11 Ambulatory referral to Gastroenterology  10. Vitamin D deficiency, unspecified  E55.9 Vitamin D, Ergocalciferol, (DRISDOL) 1.25 MG (50000 UNIT) CAPS capsule   level low - 11, completed Rx over a year ago,  did not take daily supplement and did not f/up, redo Rx supplement, then take daily 1000-2000 IU f/up 3 mon  11. Congestive heart failure, unspecified HF chronicity, unspecified heart failure type ([place])  I50.9    per cardiology, pt currently appears euvolemic w/o LE edema or DOE    F/up in the next few months for weight management, to review labs and Vit D f/up    [name], PA-C 03/19/21 11:55 AM  [facility]

## 2021-03-22 LAB — LIPID PANEL
Cholesterol: 148 mg/dL (ref <200)
HDL: 45 mg/dL — ABNORMAL LOW (ref >=50)
LDL Cholesterol (Calc): 88 mg/dL (calc)
Non-HDL Cholesterol (Calc): 103 mg/dL (calc) (ref <130)
Total CHOL/HDL Ratio: 3.3 (calc) (ref <5.0)
Triglycerides: 66 mg/dL (ref <150)

## 2021-03-22 LAB — HCV RNA,QUANTITATIVE REAL TIME PCR
HCV Quantitative Log: <1.18 Log IU/mL
HCV RNA, PCR, QN: <15 IU/mL

## 2021-03-22 LAB — COMPLETE METABOLIC PANEL WITH GFR
AG Ratio: 1.3 (calc) (ref 1.0–2.5)
ALT: 11 U/L (ref 6–29)
AST: 12 U/L (ref 10–35)
Albumin: 3.8 g/dL (ref 3.6–5.1)
Alkaline phosphatase (APISO): 67 U/L (ref 31–125)
BUN: 16 mg/dL (ref 7–25)
CO2: 27 mmol/L (ref 20–32)
Calcium: 9 mg/dL (ref 8.6–10.2)
Chloride: 106 mmol/L (ref 98–110)
Creat: 0.89 mg/dL (ref 0.50–1.10)
GFR, Est African American: 88 mL/min/{1.73_m2} (ref >=60)
GFR, Est Non African American: 76 mL/min/{1.73_m2} (ref >=60)
Globulin: 3 g/dL (calc) (ref 1.9–3.7)
Glucose, Bld: 85 mg/dL (ref 65–99)
Potassium: 4.3 mmol/L (ref 3.5–5.3)
Sodium: 139 mmol/L (ref 135–146)
Total Bilirubin: 0.4 mg/dL (ref 0.2–1.2)
Total Protein: 6.8 g/dL (ref 6.1–8.1)

## 2021-03-22 LAB — HEMOGLOBIN A1C
Hgb A1c MFr Bld: 5.5 % of total Hgb (ref <5.7)
Mean Plasma Glucose: 111 mg/dL
eAG (mmol/L): 6.2 mmol/L

## 2021-03-22 LAB — HIV ANTIBODY (ROUTINE TESTING W REFLEX): HIV 1&2 Ab, 4th Generation: NONREACTIVE

## 2021-03-22 LAB — CBC WITH DIFFERENTIAL/PLATELET
Absolute Monocytes: 441 cells/uL (ref 200–950)
Basophils Absolute: 32 cells/uL (ref 0–200)
Basophils Relative: 0.5 %
Eosinophils Absolute: 271 cells/uL (ref 15–500)
Eosinophils Relative: 4.3 %
HCT: 39.5 % (ref 35.0–45.0)
Hemoglobin: 12.7 g/dL (ref 11.7–15.5)
Lymphs Abs: 2457 cells/uL (ref 850–3900)
MCH: 31.4 pg (ref 27.0–33.0)
MCHC: 32.2 g/dL (ref 32.0–36.0)
MCV: 97.8 fL (ref 80.0–100.0)
MPV: 11.3 fL (ref 7.5–12.5)
Monocytes Relative: 7 %
Neutro Abs: 3100 cells/uL (ref 1500–7800)
Neutrophils Relative %: 49.2 %
Platelets: 274 10*3/uL (ref 140–400)
RBC: 4.04 10*6/uL (ref 3.80–5.10)
RDW: 11.8 % (ref 11.0–15.0)
Total Lymphocyte: 39 %
WBC: 6.3 10*3/uL (ref 3.8–10.8)

## 2021-03-22 LAB — HEPATITIS C ANTIBODY
Hepatitis C Ab: REACTIVE — AB
SIGNAL TO CUT-OFF: 1.39 — ABNORMAL HIGH (ref <1.00)

## 2021-04-02 ENCOUNTER — Ambulatory Visit
Admission: RE | Admit: 2021-04-02 | Discharge: 2021-04-02 | Disposition: A | Discharge status: Home / Self Care | Payer: Managed Care, Other (non HMO) | Source: Ambulatory Visit | Attending: Family Medicine | Admitting: Family Medicine

## 2021-04-02 ENCOUNTER — Other Ambulatory Visit: Payer: Self-pay

## 2021-04-02 DIAGNOSIS — Z1231 Encounter for screening mammogram for malignant neoplasm of breast: Secondary | ICD-10-CM

## 2021-04-03 ENCOUNTER — Other Ambulatory Visit: Payer: Self-pay

## 2021-04-03 DIAGNOSIS — Z1211 Encounter for screening for malignant neoplasm of colon: Secondary | ICD-10-CM

## 2021-04-03 MED ORDER — NA SULFATE-K SULFATE-MG SULF 17.5-3.13-1.6 GM/177ML PO SOLN: 1.0000 | Freq: Once | ORAL | 0 refills | Status: AC

## 2021-04-09 ENCOUNTER — Telehealth: Payer: Self-pay | Admitting: Gastroenterology

## 2021-04-09 NOTE — Telephone Encounter (Signed)
Gastroenterology Pre-Procedure Review  Request Date: 05/21/21 Requesting Physician: Dr. Marius Ditch  PATIENT REVIEW QUESTIONS: The patient responded to the following health history questions as indicated:    1. Are you having any GI issues? no 2. Do you have a personal history of Polyps? no 3. Do you have a family history of Colon Cancer or Polyps? no 4. Diabetes Mellitus? no 5. Joint replacements in the past 12 months?no 6. Major health problems in the past 3 months?no 7. Any artificial heart valves, MVP, or defibrillator?no    MEDICATIONS & ALLERGIES:    Patient reports the following regarding taking any anticoagulation/antiplatelet therapy:   Plavix, Coumadin, Eliquis, Xarelto, Lovenox, Pradaxa, Brilinta, or Effient? no Aspirin? no  Pulmonary: No BMI:  Patient confirms/reports the following medications:  Current Outpatient Medications  Medication Sig Dispense Refill  . etonogestrel (NEXPLANON) 68 MG IMPL implant 1 each by Subdermal route once.    . propranolol (INDERAL) 20 MG tablet Take 20 mg by mouth 3 (three) times daily. As needed    . Vitamin D, Ergocalciferol, (DRISDOL) 1.25 MG (50000 UNIT) CAPS capsule Take 1 capsule (50,000 Units total) by mouth every 7 (seven) days. 12 capsule 0   No current facility-administered medications for this visit.    Patient confirms/reports the following allergies:  No Known Allergies  No orders of the defined types were placed in this encounter.   AUTHORIZATION INFORMATION Primary Insurance: 1D#: Group #:  Secondary Insurance: 1D#: Group #:  SCHEDULE INFORMATION: Date:  Time: Location:

## 2021-04-12 ENCOUNTER — Telehealth: Payer: Self-pay | Admitting: Obstetrics and Gynecology

## 2021-04-12 NOTE — Telephone Encounter (Signed)
Please advise on medication to take. Thanks [employer]

## 2021-04-12 NOTE — Telephone Encounter (Signed)
Patient states she had nexplanon inserted in February, she has been having spotting- no other symptoms. Pt states that it really is not a lot but it is "annoying". Pt wants to know if there is anything to stop the spotting. Pt states a hx of fibroids, wondering if that is causing bleeding. Please Advise.

## 2021-04-13 MED ORDER — ESTRADIOL 1 MG PO TABS: 1.0000 mg | ORAL_TABLET | Freq: Every day | ORAL | 0 refills | Status: DC

## 2021-04-13 NOTE — Telephone Encounter (Signed)
Spotting could be due to her fibroids. She could try Estradiol 1 mg daily for 2 weeks to see if this will help.

## 2021-04-13 NOTE — Telephone Encounter (Signed)
Spoke to pt concerning her call to the office. I informed pt that I had spoke to Dr. Marcelline Mates concerning her current situation with her abnormal bleeding. I informed pt AC suggested her trying Estradiol 1mg . Pt stated that she would try the Estradiol. Pt was informed that she would be taking Estradiol 1 mg once a day for 14 days. Pt was advised to contact the office if she is still having bleeding after the 14 days treatment of Estradiol. Pt voiced that she understood.

## 2021-04-27 ENCOUNTER — Telehealth: Payer: Self-pay

## 2021-04-27 NOTE — Telephone Encounter (Signed)
Pt states she discussed with you at physical weight and breast she wants to see about breast reduction and needs referral also yall had talked about weight loss meds and you wanted her to get labs first.  Please advise

## 2021-04-27 NOTE — Telephone Encounter (Signed)
Copied from [place] [id number]. Topic: General - Other >> Apr 27, 2021  2:15 PM Pawlus, Brayton Layman A wrote: Reason for CRM: Pt wanted a call back regarding a breast reduction, pt stated she discussed her concerns with Kristeen Miss but was never called back.

## 2021-04-30 ENCOUNTER — Telehealth: Payer: Self-pay | Admitting: Obstetrics and Gynecology

## 2021-04-30 NOTE — Telephone Encounter (Signed)
Lori Oliver called in and states she started Estrace and was to take it for 14 days.  Lori Oliver states she took the last pill yesterday (5/15) and the bleeding stopped while she was on the medication, but once she finished, she started bleeding again.  Lori Oliver states her bleeding is brown and red.  Lori Oliver wants to know what should she do from here?  Please advise.

## 2021-05-01 NOTE — Telephone Encounter (Signed)
Left detailed vm °

## 2021-05-02 ENCOUNTER — Telehealth: Payer: Self-pay | Admitting: Obstetrics and Gynecology

## 2021-05-02 NOTE — Telephone Encounter (Signed)
New Message:  Pt states that she has the Nexplanon birth control.  She states that she has had some bleeding with it and was given a prescription to help with the bleeding.  She states that while she was taking the pill she had no bleeding.  Pt states she took the last pill on Sunday and on Tuesday she started bleeding again.  She states that she has also had some burning while urinating

## 2021-05-03 MED ORDER — ESTRADIOL 2 MG PO TABS: 2.0000 mg | ORAL_TABLET | Freq: Every day | ORAL | 0 refills | Status: DC

## 2021-05-03 NOTE — Telephone Encounter (Signed)
Please see another phone encounter.  

## 2021-05-03 NOTE — Telephone Encounter (Signed)
Pt called no answer LM via VM that AC had refilled her Estrace from 1mg  to 2 mg for an additional 14 days to help with the bleeding. Medication sent to [place]

## 2021-05-21 ENCOUNTER — Ambulatory Visit
Admission: RE | Admit: 2021-05-21 | Payer: Managed Care, Other (non HMO) | Source: Home / Self Care | Admitting: Gastroenterology

## 2021-05-21 ENCOUNTER — Encounter: Admission: RE | Payer: Self-pay | Source: Home / Self Care

## 2021-05-21 SURGERY — COLONOSCOPY WITH PROPOFOL
Anesthesia: General

## 2021-05-23 ENCOUNTER — Other Ambulatory Visit: Payer: Self-pay

## 2021-05-23 ENCOUNTER — Encounter: Payer: Self-pay | Admitting: Obstetrics and Gynecology

## 2021-05-23 ENCOUNTER — Ambulatory Visit (INDEPENDENT_AMBULATORY_CARE_PROVIDER_SITE_OTHER): Payer: Managed Care, Other (non HMO) | Admitting: Obstetrics and Gynecology

## 2021-05-23 VITALS — BP 144/96 | HR 71 | Ht 66.0 in | Wt 264.6 lb

## 2021-05-23 DIAGNOSIS — N951 Menopausal and female climacteric states: Secondary | ICD-10-CM

## 2021-05-23 DIAGNOSIS — Z3046 Encounter for surveillance of implantable subdermal contraceptive: Secondary | ICD-10-CM

## 2021-05-23 DIAGNOSIS — R102 Pelvic and perineal pain: Secondary | ICD-10-CM | POA: Diagnosis not present

## 2021-05-23 DIAGNOSIS — N921 Excessive and frequent menstruation with irregular cycle: Secondary | ICD-10-CM | POA: Diagnosis not present

## 2021-05-23 DIAGNOSIS — Z975 Presence of (intrauterine) contraceptive device: Secondary | ICD-10-CM

## 2021-05-23 LAB — POCT URINALYSIS DIPSTICK
Bilirubin, UA: NEGATIVE
Glucose, UA: NEGATIVE
Ketones, UA: NEGATIVE
Leukocytes, UA: NEGATIVE
Nitrite, UA: NEGATIVE
Protein, UA: NEGATIVE
Spec Grav, UA: 1.01 (ref 1.010–1.025)
Urobilinogen, UA: 0.2 E.U./dL
pH, UA: 6.5 (ref 5.0–8.0)

## 2021-05-23 NOTE — Progress Notes (Signed)
GYNECOLOGY PROGRESS NOTE  Subjective:    Patient ID: Lori Oliver, female    DOB: [birth year], 50 y.o.   MRN: [id number]  HPI  Patient is a 50 y.o. G1P1 female who presents for complaints of persistent uterine bleeding. She has a history uterine fibroids.  Currently has a Nexplanon in place. Has been spotting persistently for 2 months.  Has tried 2 rounds of Estradiol (2 weeks at a time each month) which helps at the time, but the bleeding resumes. Now notes heavy bleeding started Saturday. Feels energy is drained. Overall reports off and on bleeding since insertion of the Nexplanon in January. Wonders if she is in menopause and may not even need the hormones (was told at one time that she was menopausal, and then later told she was not).   Additionally she notes pain in left pelvis that radiates to groin for 1.5 weeks. Unsure if this is related to her cycles or something else.     The following portions of the patient's history were reviewed and updated as appropriate: allergies, current medications, past family history, past medical history, past social history, past surgical history, and problem list.  Review of Systems Pertinent items noted in HPI and remainder of comprehensive ROS otherwise negative.   Objective:   Blood pressure (!) 144/96, pulse 71, height 5\' 6"  (1.676 m), weight 264 lb 9.6 oz (120 kg), last menstrual period 04/15/2020. General appearance: alert and no distress Remainder of exam deferred.     Labs:  Lab Results  Component Value Date   WBC 6.3 03/19/2021   HGB 12.7 03/19/2021   HCT 39.5 03/19/2021   MCV 97.8 03/19/2021   PLT 274 03/19/2021    Results for orders placed or performed in visit on 05/23/21  POCT urinalysis dipstick  Result Value Ref Range   Color, UA yellow    Clarity, UA cloudy    Glucose, UA Negative Negative   Bilirubin, UA neg    Ketones, UA neg    Spec Grav, UA 1.010 1.010 - 1.025   Blood, UA large    pH, UA 6.5 5.0 - 8.0    Protein, UA Negative Negative   Urobilinogen, UA 0.2 0.2 or 1.0 E.U./dL   Nitrite, UA neg    Leukocytes, UA Negative Negative   Appearance yellow;cloudy    Odor      Imaging:  Narrative & Impression  Patient Name: Lori Oliver DOB: 13-Jun-1971 MRN: [id number] ULTRASOUND REPORT   Location: [facility] Date of Service: 11/01/2020        Indications:Enlarged Uterus, fibroids   Findings:  The uterus is anteverted and measures 15.6 x 5.8 x 6.8 cm. Echo texture is heterogenous with evidence of focal masses. Within the uterus are multiple suspected fibroids measuring: Fibroid 1:Anterior IM 1.9 x 1.8 x 2.0 cm Slightly smaller from prior ultrasound. Fibroid 2:Anterior IM 2.4 x 1.9 x 2.1 cm Slightly smaller from prior ultrasound. Fibroid 3: Posterior IM 5.9 x 5.8 x 5.4 cm. Slightly smaller from prior ultrasound. Fibroid 4 Fundal SS 3.6 x 3.0 x 3.3 cm. New The Endometrium measures 5 mm.   Right Ovary measures 3.4 x 2.4 x 2.1  cm. It is normal in appearance. Left Ovary measures 1.6 x 1.3 x 1.4 cm. It is normal in appearance. Survey of the adnexa demonstrates no adnexal masses. There is no free fluid in the cul de sac.   Impression: 1. Fibroids appear to be slightly small from prior ultrasound. Recommendations:  1.Clinical correlation with the patient's History and Physical Exam.     Jenine M. Albertine Grates    RDMS     I have reviewed this study and agree with documented findings.     Rubie Maid, MD [facility]      Assessment:   Perimenopausal Breakthrough bleeding with Nexplanon Uterine fibroids Pelvic pain  Plan:   Discussion had with patient as to whether she is still perimenopausal versus menopausal with bleeding.  Discussed that that may be difficult to determine with her Nexplanon in place.  Would recommend Nexplanon removal in order to assess hormones, as well as this may be the likely cause of her bleeding.  Other causes could be her uterine  fibroids, however prior to Nexplanon insertion these did not have caused many problems for her. Nexplanon in place currently.  See above discussion about removal.  After further discussion patient is okay with having Nexplanon removed.  Still desires to discuss other methods of contraception.  I discussed that if she is perimenopausal and still having issues after her Nexplanon is removed, can consider HRT or very low-dose combined OCPs such as Lo Loestrin, or an IUD.  Also could consider nonhormonal options for contraception as well as permanent sterilization.  Patient will think about her options.  She will return in 2 weeks to have her labs drawn to assess whether she is in menopause and can forego contraception or if she needs to consider a different method. Uterine fibroids -several fibroids noted.  Could be because of her pelvic pain and discomfort.  Have typically not been a major issue with bleeding in the past. Pelvic pain -on left side also radiating to the groin.  Could be caused by an ovarian cyst, uterine fibroid, musculoskeletal in nature, or other entity.  Denies any vaginal discharge or concerns for STI.  Was recently screened for STIs in the past several months which was negative.  UA negative today for UTI.  Patient can utilize heating pads, NSAIDs or Tylenol for now.  If pain continues to worsen or become prolonged, can proceed with further evaluation. We will follow-up with further management after patient has her labs drawn in 2 weeks.    Nexplanon Removal Procedure Note Patient identified, informed consent performed, consent signed.   Appropriate time out taken. Nexplanon site identified.  Area prepped in usual sterile fashon. Three ml of 1% lidocaine was used to anesthetize the area at the distal end of the implant. A small stab incision was made right beside the implant on the distal portion.  The Nexplanon rod was grasped using hemostats and removed without difficulty.  There was  minimal blood loss. There were no complications.   Steri-strips were applied over the small incision.  A pressure bandage was applied to reduce any bruising.  The patient tolerated the procedure well and was given post procedure instructions.  Patient is planning to use unsure method for contraception (for now though, will use condoms but may consider IUD soon).    A total of 25 minutes were spent face-to-face with the patient during this encounter and over half of that time involved counseling and coordination of care.   Rubie Maid, MD [facility] 05/24/2021 4:37 PM

## 2021-05-23 NOTE — Progress Notes (Signed)
Pt present for pain and vaginal bleeding. Pt c/o Left side sharp pelvic pain all day daily, prolonged bleeding,frequent urination and burning with urination and abd pain. UA completed and documented.

## 2021-05-24 NOTE — Patient Instructions (Signed)
Nexplanon Instructions After Insertion  Keep bandage clean and dry for 24 hours  May use ice/Tylenol/Ibuprofen for soreness or pain  If you develop fever, drainage or increased warmth from incision site-contact office immediately   

## 2021-06-05 ENCOUNTER — Other Ambulatory Visit: Payer: Managed Care, Other (non HMO)

## 2021-06-05 ENCOUNTER — Other Ambulatory Visit: Payer: Self-pay

## 2021-06-05 DIAGNOSIS — N951 Menopausal and female climacteric states: Secondary | ICD-10-CM

## 2021-06-06 ENCOUNTER — Other Ambulatory Visit: Payer: Managed Care, Other (non HMO)

## 2021-06-06 LAB — FSH/LH
FSH: 52.9 m[IU]/mL
LH: 40 m[IU]/mL

## 2021-09-24 ENCOUNTER — Ambulatory Visit: Payer: Managed Care, Other (non HMO) | Admitting: Family Medicine

## 2021-09-24 DIAGNOSIS — R7303 Prediabetes: Secondary | ICD-10-CM

## 2021-09-24 DIAGNOSIS — E785 Hyperlipidemia, unspecified: Secondary | ICD-10-CM

## 2021-09-24 DIAGNOSIS — E559 Vitamin D deficiency, unspecified: Secondary | ICD-10-CM

## 2021-09-24 DIAGNOSIS — I10 Essential (primary) hypertension: Secondary | ICD-10-CM

## 2021-10-15 ENCOUNTER — Telehealth (INDEPENDENT_AMBULATORY_CARE_PROVIDER_SITE_OTHER): Payer: Managed Care, Other (non HMO) | Admitting: Nurse Practitioner

## 2021-10-15 ENCOUNTER — Other Ambulatory Visit: Payer: Self-pay

## 2021-10-15 ENCOUNTER — Ambulatory Visit: Payer: Managed Care, Other (non HMO) | Admitting: Family Medicine

## 2021-10-15 ENCOUNTER — Encounter: Payer: Self-pay | Admitting: Nurse Practitioner

## 2021-10-15 ENCOUNTER — Ambulatory Visit: Payer: Self-pay

## 2021-10-15 DIAGNOSIS — I509 Heart failure, unspecified: Secondary | ICD-10-CM | POA: Diagnosis not present

## 2021-10-15 DIAGNOSIS — R062 Wheezing: Secondary | ICD-10-CM | POA: Diagnosis not present

## 2021-10-15 DIAGNOSIS — R051 Acute cough: Secondary | ICD-10-CM | POA: Diagnosis not present

## 2021-10-15 LAB — POCT INFLUENZA A/B
Influenza A, POC: NEGATIVE
Influenza B, POC: NEGATIVE

## 2021-10-15 MED ORDER — ALBUTEROL SULFATE HFA 108 (90 BASE) MCG/ACT IN AERS
2.0000 | INHALATION_SPRAY | Freq: Four times a day (QID) | RESPIRATORY_TRACT | 0 refills | Status: DC | PRN
Start: 2021-10-15 — End: 2021-10-15

## 2021-10-15 MED ORDER — ALBUTEROL SULFATE HFA 108 (90 BASE) MCG/ACT IN AERS: 2.0000 | INHALATION_SPRAY | RESPIRATORY_TRACT | 0 refills | Status: DC | PRN

## 2021-10-15 NOTE — Progress Notes (Signed)
Name: Lori Oliver   MRN: [id number]    DOB: Feb 03, 1971   Date:10/15/2021       Progress Note  Subjective  Chief Complaint  Chief Complaint  Patient presents with   URI    Congested, wheezing, cough, fatigue for 3 days    I connected with  [name]  on 10/15/21 at 11:40 AM EDT by a video enabled telemedicine application and verified that I am speaking with the correct person using two identifiers.  I discussed the limitations of evaluation and management by telemedicine and the availability of in person appointments. The patient expressed understanding and agreed to proceed with a virtual visit  Staff also discussed with the patient that there may be a patient responsible charge related to this service. Patient Location: home Provider Location: [facility] Additional Individuals present: alone  HPI  URI: Symptoms started three days ago.  She says she has had nasal congestion, non productive cough, wheezing and fatigue.  She says sometimes she feels short of breath when going up the stairs but not every time.  She denies any fever.  She says she did get the flu shot a couple weeks ago.  She says she also has been using a nasal spray but she does not know what one and says it has not helped.  Discussed coming for covid and flu testing.  Discussed covid treatment if positive.  Discussed OTC treatments for symptoms.  CHF: She denies any chest pain or lower extremity edema.  She does report shortness of breath when climbing stairs, but not every time.    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   Gastroesophageal reflux disease without esophagitis 07/17/2020   Uterine leiomyoma 07/17/2020   Atrial tachycardia ([place]) 05/17/2019   Chest tightness 05/17/2019    Intermittent atrial fibrillation ([place]) 02/19/2018   Hypokalemia 02/10/2018   Anxiety 01/13/2018   Atrial fibrillation with RVR ([place]) 12/01/2017   Vitamin D deficiency 07/08/2017   Lower back pain 07/07/2017   Class 3 severe obesity due to excess calories with serious comorbidity and body mass index (BMI) of 40.0 to 44.9 in adult [facility]) 07/07/2017   Essential hypertension 07/11/2016   IFG (impaired fasting glucose) 05/16/2016   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 05/16/2016   Abnormal thyroid function test 05/16/2016   Decreased libido 05/16/2016   Breast lump on left side at 5 o'clock position 05/16/2016   Bilateral thoracic back pain 06/01/2015   History of abnormal cervical Pap smear 06/01/2015    Social History   Tobacco Use   Smoking status: Never   Smokeless tobacco: Never  Substance Use Topics   Alcohol use: Not Currently    Alcohol/week: 0.0 standard drinks    Comment: occ     Current Outpatient Medications:    propranolol (INDERAL) 20 MG tablet, Take 20 mg by mouth 3 (three) times daily. As needed, Disp: , Rfl:    Vitamin D, Ergocalciferol, (DRISDOL) 1.25 MG (50000 UNIT) CAPS capsule, Take 1 capsule (50,000 Units total) by mouth every 7 (seven) days. (Patient not taking: No sig reported), Disp: 12 capsule, Rfl: 0  No Known Allergies  I personally reviewed active problem list, medication list, allergies with the patient/caregiver today.  ROS  Constitutional: Negative for fever or weight change.  Respiratory: Positive  for cough and shortness of breath.   Cardiovascular: Negative for chest pain or palpitations.  Gastrointestinal: Negative for abdominal pain, no bowel changes.  Musculoskeletal: Negative for gait problem or joint swelling.  Skin: Negative for rash.  Neurological: Negative for dizziness or headache.  No other specific complaints in a complete review of systems (except as listed in HPI above).   Objective  Virtual encounter, vitals not obtained.  There  is no height or weight on file to calculate BMI.  Nursing Note and Vital Signs reviewed.  Physical Exam  Awake alert and oriented, speaking in complete sentences  No results found for this or any previous visit (from the past 72 hour(s)).  Assessment & Plan  1. Acute cough -discussed covid treatment if positive -use OTC treatments for symptoms - POCT Influenza A/B - Novel Coronavirus, NAA ([facility])  2. Wheezing  - albuterol (VENTOLIN HFA) 108 (90 Base) MCG/ACT inhaler; Inhale 2 puffs into the lungs every 4 (four) hours as needed for wheezing or shortness of breath.  Dispense: 8 g; Refill: 0   3. Congestive heart failure, unspecified HF chronicity, unspecified heart failure type ([place])   -Red flags and when to present for emergency care or RTC including fever >101.56F, chest pain, shortness of breath, new/worsening/un-resolving symptoms, reviewed with patient at time of visit. Follow up and care instructions discussed and provided in AVS. - I discussed the assessment and treatment plan with the patient. The patient was provided an opportunity to ask questions and all were answered. The patient agreed with the plan and demonstrated an understanding of the instructions.  I provided 20 minutes of non-face-to-face time during this encounter.  Bo Merino, FNP

## 2021-10-15 NOTE — Telephone Encounter (Signed)
Pt. Reports she started having cough, congestion, wheezing 3 days ago. "Cough is not bad, just the congestion and wheezing." No fever    Reason for Disposition  [1] MILD difficulty breathing (e.g., minimal/no SOB at rest, SOB with walking, pulse <100) AND [2] still present when not coughing  Answer Assessment - Initial Assessment Questions 1. ONSET: "When did the cough begin?"      3 days 2. SEVERITY: "How bad is the cough today?"     Mild 3. SPUTUM: "Describe the color of your sputum" (none, dry cough; clear, white, yellow, green)     Clear 4. HEMOPTYSIS: "Are you coughing up any blood?" If so ask: "How much?" (flecks, streaks, tablespoons, etc.)     No 5. DIFFICULTY BREATHING: "Are you having difficulty breathing?" If Yes, ask: "How bad is it?" (e.g., mild, moderate, severe)    - MILD: No SOB at rest, mild SOB with walking, speaks normally in sentences, can lie down, no retractions, pulse < 100.    - MODERATE: SOB at rest, SOB with minimal exertion and prefers to sit, cannot lie down flat, speaks in phrases, mild retractions, audible wheezing, pulse 100-120.    - SEVERE: Very SOB at rest, speaks in single words, struggling to breathe, sitting hunched forward, retractions, pulse > 120      Moderate 6. FEVER: "Do you have a fever?" If Yes, ask: "What is your temperature, how was it measured, and when did it start?"     No 7. CARDIAC HISTORY: "Do you have any history of heart disease?" (e.g., heart attack, congestive heart failure)      CHF, Afib 8. LUNG HISTORY: "Do you have any history of lung disease?"  (e.g., pulmonary embolus, asthma, emphysema)     No 9. PE RISK FACTORS: "Do you have a history of blood clots?" (or: recent major surgery, recent prolonged travel, bedridden)     No 10. OTHER SYMPTOMS: "Do you have any other symptoms?" (e.g., runny nose, wheezing, chest pain)       Wheezing 11. PREGNANCY: "Is there any chance you are pregnant?" "When was your last menstrual period?"        No 12. TRAVEL: "Have you traveled out of the country in the last month?" (e.g., travel history, exposures)       No  Protocols used: Cough - Acute Productive-A-AH

## 2021-10-16 ENCOUNTER — Encounter: Payer: Self-pay | Admitting: Nurse Practitioner

## 2021-10-16 ENCOUNTER — Telehealth: Payer: Self-pay

## 2021-10-16 LAB — SARS-COV-2, NAA 2 DAY TAT

## 2021-10-16 LAB — SPECIMEN STATUS REPORT

## 2021-10-16 LAB — NOVEL CORONAVIRUS, NAA: SARS-CoV-2, NAA: NOT DETECTED

## 2021-10-16 NOTE — Telephone Encounter (Signed)
Copied from [place] [phone]. Topic: General - Other >> Oct 16, 2021  3:04 PM Valere Dross wrote: Reason for CRM: Pt called in stating she was needing a doctors note, for being out yesterday and today, pt states she was seen yesterday getting tested for covid, and received her results today, pt requested if someone can give her a call when ready, she also stated if she can get today due to needing to let her supervisor know, please advise.

## 2021-10-18 NOTE — Telephone Encounter (Signed)
Done, note at front desk

## 2021-10-22 ENCOUNTER — Ambulatory Visit
Admission: RE | Admit: 2021-10-22 | Discharge: 2021-10-22 | Disposition: A | Discharge status: Home / Self Care | Payer: Managed Care, Other (non HMO) | Attending: Family Medicine | Admitting: Family Medicine

## 2021-10-22 ENCOUNTER — Other Ambulatory Visit: Payer: Self-pay

## 2021-10-22 ENCOUNTER — Ambulatory Visit (INDEPENDENT_AMBULATORY_CARE_PROVIDER_SITE_OTHER): Payer: Managed Care, Other (non HMO) | Admitting: Family Medicine

## 2021-10-22 ENCOUNTER — Ambulatory Visit: Payer: Self-pay | Admitting: *Deleted

## 2021-10-22 ENCOUNTER — Encounter: Payer: Self-pay | Admitting: Family Medicine

## 2021-10-22 ENCOUNTER — Ambulatory Visit
Admission: RE | Admit: 2021-10-22 | Discharge: 2021-10-22 | Disposition: A | Discharge status: Home / Self Care | Payer: Managed Care, Other (non HMO) | Source: Ambulatory Visit | Attending: Family Medicine | Admitting: Family Medicine

## 2021-10-22 VITALS — BP 132/78 | HR 97 | Temp 97.9°F | Resp 16 | Ht 66.0 in | Wt 258.8 lb

## 2021-10-22 DIAGNOSIS — R0602 Shortness of breath: Secondary | ICD-10-CM | POA: Diagnosis present

## 2021-10-22 MED ORDER — PREDNISONE 20 MG PO TABS: 40.0000 mg | ORAL_TABLET | Freq: Every day | ORAL | 0 refills | Status: AC

## 2021-10-22 NOTE — Assessment & Plan Note (Signed)
Likely postviral airway inflammation given duration and onset of symptoms. Some end expiratory wheezing, initially questioned cardiac wheeze given CHF on problem list but most recent ECHO reviewed and no systolic dysfunction noted although unable to fully assess diastolic function. No cardiomegaly or edema on independent review of today's xray and no current symptoms to suggest CHF exacerbation. No signs of PNA on xray. Await formal read. F/u if no better after steroid. At that time, would recommend labs and PFT.

## 2021-10-22 NOTE — Progress Notes (Signed)
   SUBJECTIVE:   CHIEF COMPLAINT / HPI:   [place] - seen previously 10/31 for same, COVID and flu negative at that time. Duration: 1.5 weeks Description of breathing discomfort: wheezing, short of breath Related to exertion: yes Cough:  not much Chest tightness:  a little Wheezing: yes Fevers: no Chest pain: no Palpitations: no  Nausea: no Diaphoresis:  no but felt a little clammy today Status: stable Aggravating factors: none Alleviating factors: none Treatments attempted: OTC sinus medication, albuterol Denies leg swelling, PND, orthopnea Denies recent afib. Non smoker. No h/o allergies, asthma.    OBJECTIVE:   BP 132/78   Pulse 97   Temp 97.9 F (36.6 C)   Resp 16   Ht 5\' 6"  (1.676 m)   Wt 258 lb 12.8 oz (117.4 kg)   LMP 09/09/2020 Comment: Dec 2-5, 2021 spotting really light  SpO2 99%   BMI 41.77 kg/m   Gen: well appearing, in NAD Card: RRR Lungs: CTAB. Fine end expiratory wheezing in RLL. Comfortable WOB on RA. Appropriately saturated on RA.  Ext: WWP, trace b/l LE edema   ASSESSMENT/PLAN:   Shortness of breath Likely postviral airway inflammation given duration and onset of symptoms. Some end expiratory wheezing, initially questioned cardiac wheeze given CHF on problem list but most recent ECHO reviewed and no systolic dysfunction noted although unable to fully assess diastolic function. No cardiomegaly or edema on independent review of today's xray and no current symptoms to suggest CHF exacerbation. No signs of PNA on xray. Await formal read. F/u if no better after steroid. At that time, would recommend labs and PFT.     Myles Gip, DO

## 2021-10-22 NOTE — Telephone Encounter (Signed)
Pt reports wheezing, SOB to agent. Transferred to triage, states she is at work and has little time for questions. States wheezing at times, "Had gotten better, now back. States "I can hear congestion." Afebrile. Pt has appt this afternoon, secured by agent. Advised to keep appt, ED for worsening symptoms.  Reason for Disposition . [1] MILD difficulty breathing (e.g., minimal/no SOB at rest, SOB with walking, pulse <100) AND [2] NEW-onset or WORSE than normal  Protocols used: Breathing Difficulty-A-AH

## 2021-10-22 NOTE — Patient Instructions (Signed)
It was great to see you!  Our plans for today:  - Take the steroids as prescribed.  - We will release the results of your xray to your MyChart.   Take care and seek immediate care sooner if you develop any concerns.   Dr. Ky Barban

## 2021-11-13 ENCOUNTER — Ambulatory Visit: Payer: Self-pay | Admitting: *Deleted

## 2021-11-13 NOTE — Telephone Encounter (Signed)
Spoke to pt, suggested her to follow-up if she still concerned/having symptoms. Pt states she always have co-pays for her OV. She would like to know if anything else can get done without having to see her. Please Advice.

## 2021-11-13 NOTE — Telephone Encounter (Signed)
Pt. Reports she has finished the Prednisone and continues to have wheezing, mainly when she is laying down. Concerned that this has continued. Would also like to talk about a breast reduction at some point in time. Please advise pt.    Answer Assessment - Initial Assessment Questions 1. RESPIRATORY STATUS: "Describe your breathing?" (e.g., wheezing, shortness of breath, unable to speak, severe coughing)      Short of breath with exertion 2. ONSET: "When did this breathing problem begin?"      2 weeks ago 3. PATTERN "Does the difficult breathing come and go, or has it been constant since it started?"      Comes and goes 4. SEVERITY: "How bad is your breathing?" (e.g., mild, moderate, severe)    - MILD: No SOB at rest, mild SOB with walking, speaks normally in sentences, can lie down, no retractions, pulse < 100.    - MODERATE: SOB at rest, SOB with minimal exertion and prefers to sit, cannot lie down flat, speaks in phrases, mild retractions, audible wheezing, pulse 100-120.    - SEVERE: Very SOB at rest, speaks in single words, struggling to breathe, sitting hunched forward, retractions, pulse > 120      Mild 5. RECURRENT SYMPTOM: "Have you had difficulty breathing before?" If Yes, ask: "When was the last time?" and "What happened that time?"      No 6. CARDIAC HISTORY: "Do you have any history of heart disease?" (e.g., heart attack, angina, bypass surgery, angioplasty)      No 7. LUNG HISTORY: "Do you have any history of lung disease?"  (e.g., pulmonary embolus, asthma, emphysema)     No 8. CAUSE: "What do you think is causing the breathing problem?"      No 9. OTHER SYMPTOMS: "Do you have any other symptoms? (e.g., dizziness, runny nose, cough, chest pain, fever)     No 10. O2 SATURATION MONITOR:  "Do you use an oxygen saturation monitor (pulse oximeter) at home?" If Yes, "What is your reading (oxygen level) today?" "What is your usual oxygen saturation reading?" (e.g., 95%)        No 11. PREGNANCY: "Is there any chance you are pregnant?" "When was your last menstrual period?"       No 12. TRAVEL: "Have you traveled out of the country in the last month?" (e.g., travel history, exposures)       No  Protocols used: Breathing Difficulty-A-AH

## 2021-11-13 NOTE — Telephone Encounter (Signed)
I attempted to return pt's call.   I left a voicemail for her to return the call.   She called earlier today.  She has finished her round of medication but is experiencing wheezing intermittently and when she is laying down.

## 2021-11-14 NOTE — Telephone Encounter (Signed)
Pt was informed.

## 2021-11-20 ENCOUNTER — Encounter: Payer: Self-pay | Admitting: Nurse Practitioner

## 2021-11-20 ENCOUNTER — Other Ambulatory Visit: Payer: Self-pay

## 2021-11-20 ENCOUNTER — Ambulatory Visit (INDEPENDENT_AMBULATORY_CARE_PROVIDER_SITE_OTHER): Payer: Managed Care, Other (non HMO) | Admitting: Nurse Practitioner

## 2021-11-20 VITALS — BP 118/74 | HR 84 | Temp 98.1°F | Resp 16 | Ht 66.0 in | Wt 262.4 lb

## 2021-11-20 DIAGNOSIS — R052 Subacute cough: Secondary | ICD-10-CM

## 2021-11-20 DIAGNOSIS — R062 Wheezing: Secondary | ICD-10-CM | POA: Diagnosis not present

## 2021-11-20 MED ORDER — PREDNISONE 10 MG PO TABS: ORAL_TABLET | ORAL | 0 refills | Status: DC

## 2021-11-20 NOTE — Progress Notes (Signed)
BP 118/74   Pulse 84   Temp 98.1 F (36.7 C) (Oral)   Resp 16   Ht 5\' 6"  (1.676 m)   Wt 262 lb 6.4 oz (119 kg)   LMP 09/09/2020 Comment: Dec 2-5, 2021 spotting really light  SpO2 99%   BMI 42.35 kg/m    Subjective:    Patient ID: Lori Oliver, female    DOB: 11/17/1971, 50 y.o.   MRN: [id number]  HPI: Lori Oliver is a 50 y.o. female  Chief Complaint  Patient presents with   Cough    Congested, wheezing, SOB on and off for 1 month   Cough/ Wheezing:  She says that she has been sick for over a month.  Her current symptoms are coughing and wheezing. She had a virtual appointment with me on 10/15/21.  Tested her for Covid and Flu which were both negative.  She then saw Dr. Ky Barban on 10/22/21.  She had a chest xray that was negative and given a course of steroids.  Dr. Ky Barban also suggested PFT if no improvement.  She reports that she felt better when she took the steroids but started feeling bad again as soon as she was done with the steroids.  She also has a history of congestive heart failure but her last echo there was no systolic dysfunction noted.  She denies any leg swelling or chest pain.  Will send in referral to pulmonology for pulmonary function test.  Will also repeat Covid test per patient's request.  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  Discussed adding a steroid inhaler for patient but not covered by insurance.  Discussed OTC treatments to take for symptoms.  Will give one more round of oral steroids.   Relevant past medical, surgical, family and social history reviewed and updated as indicated. Interim medical history since our last visit reviewed. Allergies and medications reviewed and updated.  Review of Systems  Constitutional: Negative for fever or weight change.  Respiratory:Positive for cough and shortness of breath.   Cardiovascular: Negative for chest pain or palpitations.  Gastrointestinal: Negative for abdominal pain, no bowel changes.   Musculoskeletal: Negative for gait problem or joint swelling.  Skin: Negative for rash.  Neurological: Negative for dizziness or headache.  No other specific complaints in a complete review of systems (except as listed in HPI above).      Objective:    BP 118/74   Pulse 84   Temp 98.1 F (36.7 C) (Oral)   Resp 16   Ht 5\' 6"  (1.676 m)   Wt 262 lb 6.4 oz (119 kg)   LMP 09/09/2020 Comment: Dec 2-5, 2021 spotting really light  SpO2 99%   BMI 42.35 kg/m   Wt Readings from Last 3 Encounters:  11/20/21 262 lb 6.4 oz (119 kg)  10/22/21 258 lb 12.8 oz (117.4 kg)  05/23/21 264 lb 9.6 oz (120 kg)    Physical Exam  Constitutional: Patient appears well-developed and well-nourished. Obese No distress.  HEENT: head atraumatic, normocephalic, pupils equal and reactive to light, , neck supple Cardiovascular: Normal rate, regular rhythm and normal heart sounds.  No murmur heard. No BLE edema. Pulmonary/Chest: Effort normal and breath sounds scattered expiratory wheezing. No respiratory distress. Abdominal: Soft.  There is no tenderness. Psychiatric: Patient has a normal mood and affect. behavior is normal. Judgment and thought content normal.   Results for orders placed or performed in visit on 10/15/21  Novel Coronavirus, NAA ([facility])  Specimen: Nasopharyngeal(NP) swabs in vial transport medium   Nasopharynge  Previous  Result Value Ref Range   SARS-CoV-2, NAA Not Detected Not Detected  SARS-COV-2, NAA 2 DAY TAT   Nasopharynge  Previous  Result Value Ref Range   SARS-CoV-2, NAA 2 DAY TAT Performed   Specimen status report  Result Value Ref Range   specimen status report Comment   POCT Influenza A/B  Result Value Ref Range   Influenza A, POC Negative Negative   Influenza B, POC Negative Negative      Assessment & Plan:   1. Subacute cough -otc treatments for symptoms  2. Wheezing  - Ambulatory referral to Pulmonology - Novel Coronavirus, NAA ([facility]) - predniSONE  (DELTASONE) 10 MG tablet; Day 1 take 6 pills, day 2 take 5 pills, day 3 take 4 pills, day 4 take 3 pills, day 5 take 2 pills, day 6 take 1 pill  Dispense: 21 tablet; Refill: 0   Follow up plan: Return if symptoms worsen or fail to improve.

## 2021-11-22 LAB — NOVEL CORONAVIRUS, NAA: SARS-CoV-2, NAA: NOT DETECTED

## 2021-11-22 LAB — SPECIMEN STATUS REPORT

## 2021-12-12 ENCOUNTER — Ambulatory Visit: Payer: Self-pay

## 2021-12-12 ENCOUNTER — Other Ambulatory Visit: Payer: Self-pay | Admitting: Nurse Practitioner

## 2021-12-12 ENCOUNTER — Telehealth: Payer: Self-pay | Admitting: Family Medicine

## 2021-12-12 ENCOUNTER — Ambulatory Visit: Payer: Self-pay | Admitting: *Deleted

## 2021-12-12 DIAGNOSIS — R062 Wheezing: Secondary | ICD-10-CM

## 2021-12-12 DIAGNOSIS — R0602 Shortness of breath: Secondary | ICD-10-CM

## 2021-12-12 DIAGNOSIS — R052 Subacute cough: Secondary | ICD-10-CM

## 2021-12-12 MED ORDER — TRELEGY ELLIPTA 100-62.5-25 MCG/ACT IN AEPB: 1.0000 | INHALATION_SPRAY | Freq: Every day | RESPIRATORY_TRACT | 0 refills | Status: DC

## 2021-12-12 NOTE — Telephone Encounter (Signed)
See triage encounter from 12/12/21

## 2021-12-12 NOTE — Telephone Encounter (Addendum)
°  Chief Complaint: Began wheezing after completing deltasone 10 mg tabs Symptoms: mild wheezing, comes and goes with activity Frequency: about one week ago Pertinent Negatives: Patient denies fever and SOB Disposition: [] ED /[] Urgent Care (no appt availability in office) / [x] Appointment(In office/virtual)/ []  [place] [facility]/ [] Home Care/ [x] Refused Recommended Disposition  Additional Notes: Patient was seen on 11/20/21 and is asking for refill of prednisone. She has pulmonary appointment on 01/10/21. Offered appointment. Pharmacy on file.      Reason for Disposition  [1] MILD difficulty breathing (e.g., minimal/no SOB at rest, SOB with walking, pulse <100) AND [2] NEW-onset or WORSE than normal  Answer Assessment - Initial Assessment Questions 1. RESPIRATORY STATUS: "Describe your breathing?" (e.g., wheezing, shortness of breath, unable to speak, severe coughing)      wheezing 2. ONSET: "When did this breathing problem begin?"     About 5 days ago after completing prednisone 3. PATTERN "Does the difficult breathing come and go, or has it been constant since it started?"      Comes and goes 4. SEVERITY: "How bad is your breathing?" (e.g., mild, moderate, severe)    - MILD: No SOB at rest, mild SOB with walking, speaks normally in sentences, can lie down, no retractions, pulse < 100.    - MODERATE: SOB at rest, SOB with minimal exertion and prefers to sit, cannot lie down flat, speaks in phrases, mild retractions, audible wheezing, pulse 100-120.    - SEVERE: Very SOB at rest, speaks in single words, struggling to breathe, sitting hunched forward, retractions, pulse > 120      mild 5. RECURRENT SYMPTOM: "Have you had difficulty breathing before?" If Yes, ask: "When was the last time?" and "What happened that time?"      Yes, before recent treatment 6. CARDIAC HISTORY: "Do you have any history of heart disease?" (e.g., heart attack, angina, bypass surgery, angioplasty)       7.  LUNG HISTORY: "Do you have any history of lung disease?"  (e.g., pulmonary embolus, asthma, emphysema)      8. CAUSE: "What do you think is causing the breathing problem?"       9. OTHER SYMPTOMS: "Do you have any other symptoms? (e.g., dizziness, runny nose, cough, chest pain, fever)     Mild occasional SOB with walking upstairs 10. O2 SATURATION MONITOR:  "Do you use an oxygen saturation monitor (pulse oximeter) at home?" If Yes, "What is your reading (oxygen level) today?" "What is your usual oxygen saturation reading?" (e.g., 95%)        11. PREGNANCY: "Is there any chance you are pregnant?" "When was your last menstrual period?"        12. TRAVEL: "Have you traveled out of the country in the last month?" (e.g., travel history, exposures)       na  Protocols used: Breathing Difficulty-A-AH

## 2021-12-12 NOTE — Telephone Encounter (Signed)
Patient called in and stated that she is wheezing again say that few days after taking the medication predniSONE (DELTASONE) 10 MG tablet she will go back to wheezing and concerned. Will call Pulmonary to schedule the appointment they recently contacted her about. Due to her work schedule she was not able to get the appointment yet. Please advise. Asking for an increase on the predniSONE (DELTASONE) 10 MG tablet Ph# [phone]   Left message to call back.

## 2021-12-12 NOTE — Telephone Encounter (Signed)
See other 12/12/21 NT encounter routed to office.

## 2021-12-12 NOTE — Telephone Encounter (Signed)
Pt was informed.

## 2021-12-12 NOTE — Telephone Encounter (Signed)
Copied from [place] [id number]. Topic: Quick Communication - Rx Refill/Question >> Dec 12, 2021  7:54 AM Leward Quan A wrote: Medication: predniSONE (DELTASONE) 10 MG tablet  Has the patient contacted their pharmacy? No. (Agent: If no, request that the patient contact the pharmacy for the refill. If patient does not wish to contact the pharmacy document the reason why and proceed with request.) (Agent: If yes, when and what did the pharmacy advise?)  Preferred Pharmacy (with phone number or street name): [facility], [employer] [place] [employer]  Phone:  [phone] Fax:  [phone]    Has the patient been seen for an appointment in the last year OR does the patient have an upcoming appointment? Yes.    Agent: Please be advised that RX refills may take up to 3 business days. We ask that you follow-up with your pharmacy.

## 2021-12-18 ENCOUNTER — Ambulatory Visit: Payer: Self-pay

## 2021-12-18 NOTE — Telephone Encounter (Signed)
Chief Complaint: Wheezing  Symptoms: Mild SOB when up stairs Frequency: Ongoing for past few weeks Pertinent Negatives: Patient denies Other symptoms. Disposition: [] ED /[] Urgent Care (no appt availability in office) / [] Appointment(In office/virtual)/ []  [facility]/ [] Home Care/ [] Refused Recommended Disposition /[] [facility]/ [x]  Follow-up with PCP Additional Notes: patient reports an inhaler was sent in (Trelegy) and it will cost her $500 so she did not and will not pick that up. She says she has the albuterol, but it's not helping the wheezing. Just concerned and would like a call back. She says she's working and the wheezing is not causing her to not work, just has concerns since still wheezing after a couple rounds of prednisone.  Reason for Disposition  [1] MILD longstanding difficulty breathing AND [2]  SAME as normal  Answer Assessment - Initial Assessment Questions 1. RESPIRATORY STATUS: "Describe your breathing?" (e.g., wheezing, shortness of breath, unable to speak, severe coughing)      Wheezing, dry cough 2. ONSET: "When did this breathing problem begin?"      Before 12/28 (prednisone taken 2 different times) 3. PATTERN "Does the difficult breathing come and go, or has it been constant since it started?"      Come and go SOB 4. SEVERITY: "How bad is your breathing?" (e.g., mild, moderate, severe)    - MILD: No SOB at rest, mild SOB with walking, speaks normally in sentences, can lie down, no retractions, pulse < 100.    - MODERATE: SOB at rest, SOB with minimal exertion and prefers to sit, cannot lie down flat, speaks in phrases, mild retractions, audible wheezing, pulse 100-120.    - SEVERE: Very SOB at rest, speaks in single words, struggling to breathe, sitting hunched forward, retractions, pulse > 120      Mild at times when go up stairs 5. RECURRENT SYMPTOM: "Have you had difficulty breathing before?" If Yes, ask: "When was the last time?" and "What  happened that time?"      N/A 6. CARDIAC HISTORY: "Do you have any history of heart disease?" (e.g., heart attack, angina, bypass surgery, angioplasty)      N/A 7. LUNG HISTORY: "Do you have any history of lung disease?"  (e.g., pulmonary embolus, asthma, emphysema)     N/A 8. CAUSE: "What do you think is causing the breathing problem?"      I don't know 9. OTHER SYMPTOMS: "Do you have any other symptoms? (e.g., dizziness, runny nose, cough, chest pain, fever)     Dry cough 10. O2 SATURATION MONITOR:  "Do you use an oxygen saturation monitor (pulse oximeter) at home?" If Yes, "What is your reading (oxygen level) today?" "What is your usual oxygen saturation reading?" (e.g., 95%)       N/A 11. PREGNANCY: "Is there any chance you are pregnant?" "When was your last menstrual period?"       N/A 12. TRAVEL: "Have you traveled out of the country in the last month?" (e.g., travel history, exposures)       N/A  Protocols used: Breathing Difficulty-A-AH

## 2022-01-10 ENCOUNTER — Institutional Professional Consult (permissible substitution): Payer: Managed Care, Other (non HMO) | Admitting: Internal Medicine

## 2022-01-10 NOTE — Progress Notes (Incomplete)
° °  Lori Oliver, female    DOB: [birth year],   MRN: [id number]   Brief patient profile:  ***  yo***  *** referred to pulmonary clinic in [facility]  01/10/2022 by Dr Marland Kitchen  for ***          History of Present Illness  01/10/2022  Pulmonary/ 1st office eval/ Lori Oliver / [employer]  No chief complaint on file.    Dyspnea:  *** Cough: *** Sleep: *** SABA use:   Past Medical History:  Diagnosis Date   Abnormal thyroid function test 05/16/2016   Anxiety    Atrial fibrillation (HCC)    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 05/16/2016   Congestive heart failure, unspecified HF chronicity, unspecified heart failure type ([place]) 03/19/2021   Decreased libido 05/16/2016   Dysrhythmia    Gastroesophageal reflux disease without esophagitis 07/17/2020   Hyperlipidemia 07/17/2020   Hypertension    Hypokalemia    Impaired fasting glucose    Morbid obesity ([place]) 06/01/2015   Morbid obesity ([place])    Plantar fasciitis    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    Outpatient Medications Prior to Visit  Medication Sig Dispense Refill   albuterol (VENTOLIN HFA) 108 (90 Base) MCG/ACT inhaler Inhale 2 puffs into the lungs every 4 (four) hours as needed for wheezing or shortness of breath. 8 g 0   Fluticasone-Umeclidin-Vilant (TRELEGY ELLIPTA) 100-62.5-25 MCG/ACT AEPB Inhale 1 puff into the lungs daily. 1 each 0   predniSONE (DELTASONE) 10 MG tablet Day 1 take 6 pills, day 2 take 5 pills, day 3 take 4 pills, day 4 take 3 pills, day 5 take 2 pills, day 6 take 1 pill 21 tablet 0   propranolol (INDERAL) 20 MG tablet Take 20 mg by mouth 3 (three) times daily. As needed     No facility-administered medications prior to visit.     Objective:     LMP 09/09/2020 Comment: Dec 2-5, 2021 spotting really light      I personally reviewed images and agree with radiology impression as follows:  CXR:   10/22/21 No active cardiopulmonary disease.    Assessment   No problem-specific Assessment & Plan notes found for this  encounter.     Lori Gully, MD 01/10/2022

## 2022-02-07 ENCOUNTER — Other Ambulatory Visit: Payer: Self-pay

## 2022-02-07 ENCOUNTER — Telehealth: Payer: Self-pay

## 2022-02-07 NOTE — Telephone Encounter (Signed)
Copied from [place] [id number]. Topic: Referral - Request for Referral >> Feb 06, 2022  4:17 PM Alanda Slim E wrote: Has patient seen PCP for this complaint? Yes  *If NO, is insurance requiring patient see PCP for this issue before PCP can refer them? Referral for which specialty: weight loss management  Preferred provider/office: [facility] / [address], [place] / Dr. Kerin Perna Farrell/  Reason for referral: weight loss surgery/ pt asked if she can be given a call after referral has been placed

## 2022-02-07 NOTE — Telephone Encounter (Signed)
Pt has been notified.

## 2022-02-21 ENCOUNTER — Other Ambulatory Visit: Payer: Self-pay

## 2022-02-21 ENCOUNTER — Encounter: Payer: Self-pay | Admitting: Internal Medicine

## 2022-02-21 ENCOUNTER — Other Ambulatory Visit
Admission: RE | Admit: 2022-02-21 | Discharge: 2022-02-21 | Disposition: A | Discharge status: Home / Self Care | Payer: Managed Care, Other (non HMO) | Attending: Internal Medicine | Admitting: Internal Medicine

## 2022-02-21 ENCOUNTER — Ambulatory Visit (INDEPENDENT_AMBULATORY_CARE_PROVIDER_SITE_OTHER): Payer: Managed Care, Other (non HMO) | Admitting: Internal Medicine

## 2022-02-21 DIAGNOSIS — R058 Other specified cough: Secondary | ICD-10-CM

## 2022-02-21 DIAGNOSIS — Z6841 Body Mass Index (BMI) 40.0 and over, adult: Secondary | ICD-10-CM

## 2022-02-21 LAB — CBC WITH DIFFERENTIAL/PLATELET
Abs Immature Granulocytes: 0.02 10*3/uL (ref 0.00–0.07)
Basophils Absolute: 0 10*3/uL (ref 0.0–0.1)
Basophils Relative: 0 %
Eosinophils Absolute: 0.4 10*3/uL (ref 0.0–0.5)
Eosinophils Relative: 4 %
HCT: 38 % (ref 36.0–46.0)
Hemoglobin: 12.6 g/dL (ref 12.0–15.0)
Immature Granulocytes: 0 %
Lymphocytes Relative: 44 %
Lymphs Abs: 4.2 10*3/uL — ABNORMAL HIGH (ref 0.7–4.0)
MCH: 31.4 pg (ref 26.0–34.0)
MCHC: 33.2 g/dL (ref 30.0–36.0)
MCV: 94.8 fL (ref 80.0–100.0)
Monocytes Absolute: 0.6 10*3/uL (ref 0.1–1.0)
Monocytes Relative: 6 %
Neutro Abs: 4.4 10*3/uL (ref 1.7–7.7)
Neutrophils Relative %: 46 %
Platelets: 288 10*3/uL (ref 150–400)
RBC: 4.01 MIL/uL (ref 3.87–5.11)
RDW: 12.4 % (ref 11.5–15.5)
WBC: 9.7 10*3/uL (ref 4.0–10.5)
nRBC: 0 % (ref 0.0–0.2)

## 2022-02-21 LAB — TSH: TSH: 2.277 u[IU]/mL (ref 0.350–4.500)

## 2022-02-21 MED ORDER — METHYLPREDNISOLONE ACETATE 80 MG/ML IJ SUSP
120.0000 mg | Freq: Once | INTRAMUSCULAR | Status: AC
  Administered 2022-02-21: 16:00:00 120 mg via INTRAMUSCULAR

## 2022-02-21 MED ORDER — PREDNISONE 10 MG PO TABS: ORAL_TABLET | ORAL | 0 refills | Status: DC

## 2022-02-21 MED ORDER — FAMOTIDINE 20 MG PO TABS
ORAL_TABLET | ORAL | 11 refills | Status: DC
Start: 2022-02-21 — End: 2022-09-04

## 2022-02-21 MED ORDER — PANTOPRAZOLE SODIUM 40 MG PO TBEC: 40.0000 mg | DELAYED_RELEASE_TABLET | Freq: Every day | ORAL | 2 refills | Status: DC

## 2022-02-21 NOTE — Patient Instructions (Addendum)
Ask [name] for substitutes for propranolol (metaprolol is ok especially in low doses) ? ?Pantoprazole (protonix) 40 mg   Take  30-60 min before first meal of the day and Pepcid (famotidine)  20 mg after supper until return to office - this is the best way to tell whether stomach acid is contributing to your problem.   ? ?GERD (REFLUX)  is an extremely common cause of respiratory symptoms just like yours , many times with no obvious heartburn at all.  ? ? It can be treated with medication, but also with lifestyle changes including elevation of the head of your bed (ideally with 6 -8inch blocks under the headboard of your bed),  Smoking cessation, avoidance of late meals, excessive alcohol, and avoid fatty foods, chocolate, peppermint, colas, red wine, and acidic juices such as orange juice.  ?NO MINT OR MENTHOL PRODUCTS SO NO COUGH DROPS  ?USE SUGARLESS CANDY INSTEAD (Jolley ranchers or Stover's or Life Savers) or even ice chips will also do - the key is to swallow to prevent all throat clearing. ?NO OIL BASED VITAMINS - use powdered substitutes.  Avoid fish oil when coughing.  ? ?Depomedrol 120 mg IM today  ? ?If not better add >  Prednisone 10 mg take  4 each am x 2 days,   2 each am x 2 days,  1 each am x 2 days and stop  ? ?Please remember to go to the lab department   for your tests - we will call you with the results when they are available. ? ?Please schedule a follow up office visit in 6 weeks, call sooner if needed  ?    ? ? ? ? ? ? ?

## 2022-02-21 NOTE — Progress Notes (Signed)
? ?Lori Oliver, female    DOB: 10-Mar-1971,   MRN: [id number] ? ? ?Brief patient profile:  ?18 ywbf   never smoker NA with Hospice with onset in early 70s rhinitis spring time sneezing then  2019 heart GERD then referred to pulmonary clinic in [facility]  02/21/2022 by [name]  for cough [name] onset early 2021-11-07  ? ?Husband passed away 05-07-20 / baseline wt  130 p last baby born 6    ? ? ?History of Present Illness  ?02/21/2022  Pulmonary/ 1st office eval/ [name] / [employer]  Office  ?Chief Complaint  ?Patient presents with  ? pulmonary consult  ?  Per [name], PA--occ prod cough with clear sputum mainly in the morning and wheezing with laying flat and with exertion.  ?Dyspnea: ok at home, thinks mask makes it hard to breathe at work ?Cough: onset p stirs then "feels like something stuck" in throat but does not wake her up/ min mucoid prod ?Sleep: flat bed props up on pillows  ?SABA use: prednisone worked the best/ inhalers don't  ? ?No obvious day to day or daytime variability or assoc excess/ purulent sputum or mucus plugs or hemoptysis or cp or chest tightness, subjective wheeze or overt sinus or hb symptoms.  ? ?sleeping without nocturnal  or early am exacerbation  of respiratory  c/o's or need for noct saba. Also denies any obvious fluctuation of symptoms with weather or environmental changes or other aggravating or alleviating factors except as outlined above  ? ?No unusual exposure hx or h/o childhood pna/ asthma or knowledge of premature birth. ? ?Current Allergies, Complete Past Medical History, Past Surgical History, Family History, and Social History were reviewed in [employer] record. ? ?ROS  The following are not active complaints unless bolded ?Hoarseness, sore throat, dysphagia, dental problems, itching, sneezing,  nasal congestion or discharge of excess mucus or purulent secretions, ear ache,   fever, chills, sweats, unintended wt loss or wt gain, classically  pleuritic or exertional cp,  orthopnea pnd or arm/hand swelling  or leg swelling, presyncope, palpitations, abdominal pain, anorexia, nausea, vomiting, diarrhea  or change in bowel habits or change in bladder habits, change in stools or change in urine, dysuria, hematuria,  rash, arthralgias, visual complaints, headache, numbness, weakness or ataxia or problems with walking or coordination,  change in mood or  memory. ?      ?   ? ? ? ?Past Medical History:  ?Diagnosis Date  ? Abnormal thyroid function test 05/16/2016  ? Anxiety   ? Atrial fibrillation ([place])   ? 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 05/16/2016  ? Congestive heart failure, unspecified HF chronicity, unspecified heart failure type ([place]) 03/19/2021  ? Decreased libido 05/16/2016  ? Dysrhythmia   ? Gastroesophageal reflux disease without esophagitis 07/17/2020  ? Hyperlipidemia 07/17/2020  ? Hypertension   ? Hypokalemia   ? Impaired fasting glucose   ? Morbid obesity ([place]) 06/01/2015  ? Morbid obesity ([place])   ? Plantar fasciitis   ? 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 ? ?Outpatient Medications Prior to Visit  ?Medication Sig Dispense Refill  ? albuterol (VENTOLIN HFA) 108 (90 Base) MCG/ACT inhaler Inhale 2 puffs into the lungs every 4 (four) hours as needed for wheezing or shortness of breath. 8 g 0  ? propranolol (INDERAL) 20 MG tablet Take 20 mg by mouth 3 (three) times daily. As needed    ? Fluticasone-Umeclidin-Vilant (TRELEGY ELLIPTA) 100-62.5-25 MCG/ACT AEPB Inhale 1 puff into  the lungs daily. (Patient not taking: Reported on 02/21/2022) 1 each 0  ? predniSONE (DELTASONE) 10 MG tablet Day 1 take 6 pills, day 2 take 5 pills, day 3 take 4 pills, day 4 take 3 pills, day 5 take 2 pills, day 6 take 1 pill 21 tablet 0  ? ?No facility-administered medications prior to visit.  ? ? ? ?Objective:  ?  ? ?BP 124/70 (BP Location: Left Arm, Cuff Size: Large)   Pulse 82   Temp 98.5 ?F (36.9 ?C) (Temporal)   Ht '5\' 6"'$  (1.676 m)   Wt 269 lb (122 kg)   LMP 09/09/2020 Comment: Dec 2-5,  2021 spotting really light  SpO2 98%   BMI 43.42 kg/m?  ? ?SpO2: 98 % ? ?Amb morbidly obese (by BMI) amb bf occ throat clearing and classic pseudowheeze ? ? HEENT : pt wearing mask not removed for exam due to covid -19 concerns.  ? ? ?NECK :  without JVD/Nodes/TM/ nl carotid upstrokes bilaterally ? ? ?LUNGS: no acc muscle use,  Nl contour chest which is clear to A and P bilaterally without cough on insp or exp maneuvers ? ? ?CV:  RRR  no s3 or murmur or increase in P2, and no edema  ? ?ABD:  obese soft and nontender with nl inspiratory excursion in the supine position. No bruits or organomegaly appreciated, bowel sounds nl ? ?MS:  Nl gait/ ext warm without deformities, calf tenderness, cyanosis or clubbing ?No obvious joint restrictions  ? ?SKIN: warm and dry without lesions   ? ?NEURO:  alert, approp, nl sensorium with  no motor or cerebellar deficits apparent.  ? ? ?I personally reviewed images and agree with radiology impression as follows:  ?CXR:   Pa and lateral 10/22/21  ?No active cardiopulmonary disease. ?  ?   ?Assessment  ? ?Upper airway cough syndrome ?Onset nov 2022  ?-   Allergy screen 02/21/2022 >  Eos 0.4 /  IgE  Pending ?-   02/21/2022 max rx for gerd ? ?Most likely this is upper airway cough syndrome (previously labeled PNDS),  is so named because it's frequently impossible to sort out how much is  CR/sinusitis with freq throat clearing (which can be related to primary GERD)   vs  causing  secondary (" extra esophageal")  GERD from wide swings in gastric pressure that occur with throat clearing, often  promoting self use of mint and menthol lozenges that reduce the lower esophageal sphincter tone and exacerbate the problem further in a cyclical fashion.  ? ?These are the same pts (now being labeled as having "irritable larynx syndrome" by some cough centers) who not infrequently have a history of having failed to tolerate ace inhibitors,  dry powder inhalers or biphosphonates or report having  atypical/extraesophageal reflux symptoms(gerd documented at [facility] 2019)  that don't respond to standard doses of PPI  and are easily confused as having aecopd or asthma flares by even experienced allergists/ pulmonologists (myself included).  ? ?rec start with max rx for gerd/ cyclical cough await allergy screen and >>> also so added 6 day taper off  Prednisone starting at 40 mg per day in case of component of Th-2 driven upper or lower airways inflammation (if cough responds short term only to relapse before return while will on full rx for uacs (as above), then  that would point to allergic rhinitis/ asthma or eos bronchitis as alternative dx)  ? ?Also in case this is asthma asked her to discuss  avoiding inderol with [name] and using saba prn- The proper method of use, as well as anticipated side effects, of a metered-dose inhaler were discussed and demonstrated to the patient using teach back method. ? ?    ?  ? ?  ?     ? ?Class 3 severe obesity due to excess calories with serious comorbidity and body mass index (BMI) of 40.0 to 44.9 in adult [facility]) ?Body mass index is 43.42 kg/m?.   ?Lab Results  ?Component Value Date  ? TSH 2.277 02/21/2022  ?  ? ? ?Contributing to doe and risk of GERD ?>>>   reviewed the need and the process to achieve and maintain neg calorie balance > defer f/u primary care including intermittently monitoring thyroid status    ? ? ?    ?  ? ?Each maintenance medication was reviewed in detail including emphasizing most importantly the difference between maintenance and prns and under what circumstances the prns are to be triggered using an action plan format where appropriate. ? ?Total time for H and P, chart review, counseling, reviewing hfa device(s) and generating customized AVS unique to this office visit / same day charting > 45 min  ?     ? ? ? ? ?Christinia Gully, MD ?02/21/2022 ?    ?

## 2022-02-22 ENCOUNTER — Encounter: Payer: Self-pay | Admitting: Internal Medicine

## 2022-02-22 NOTE — Assessment & Plan Note (Signed)
Onset nov 2022  ?-   Allergy screen 02/21/2022 >  Eos 0.4 /  IgE  Pending ?-   02/21/2022 max rx for gerd ? ?Most likely this is upper airway cough syndrome (previously labeled PNDS),  is so named because it's frequently impossible to sort out how much is  CR/sinusitis with freq throat clearing (which can be related to primary GERD)   vs  causing  secondary (" extra esophageal")  GERD from wide swings in gastric pressure that occur with throat clearing, often  promoting self use of mint and menthol lozenges that reduce the lower esophageal sphincter tone and exacerbate the problem further in a cyclical fashion.  ? ?These are the same pts (now being labeled as having "irritable larynx syndrome" by some cough centers) who not infrequently have a history of having failed to tolerate ace inhibitors,  dry powder inhalers or biphosphonates or report having atypical/extraesophageal reflux symptoms(gerd documented at [facility] 2019)  that don't respond to standard doses of PPI  and are easily confused as having aecopd or asthma flares by even experienced allergists/ pulmonologists (myself included).  ? ?rec start with max rx for gerd/ cyclical cough await allergy screen and >>> also so added 6 day taper off  Prednisone starting at 40 mg per day in case of component of Th-2 driven upper or lower airways inflammation (if cough responds short term only to relapse before return while will on full rx for uacs (as above), then  that would point to allergic rhinitis/ asthma or eos bronchitis as alternative dx)  ? ?Also in case this is asthma asked her to discuss avoiding inderol with [name] and using saba prn- The proper method of use, as well as anticipated side effects, of a metered-dose inhaler were discussed and demonstrated to the patient using teach back method. ? ?    ?  ? ?  ?     ?

## 2022-02-22 NOTE — Assessment & Plan Note (Signed)
Body mass index is 43.42 kg/m?.   ?Lab Results  ?Component Value Date  ? TSH 2.277 02/21/2022  ?  ? ? ?Contributing to doe and risk of GERD ?>>>   reviewed the need and the process to achieve and maintain neg calorie balance > defer f/u primary care including intermittently monitoring thyroid status    ? ? ?    ?  ? ?Each maintenance medication was reviewed in detail including emphasizing most importantly the difference between maintenance and prns and under what circumstances the prns are to be triggered using an action plan format where appropriate. ? ?Total time for H and P, chart review, counseling, reviewing hfa device(s) and generating customized AVS unique to this office visit / same day charting > 45 min  ?     ?

## 2022-02-24 LAB — IGE: IgE (Immunoglobulin E), Serum: 49 IU/mL (ref 6–495)

## 2022-02-25 ENCOUNTER — Telehealth: Payer: Self-pay | Admitting: Internal Medicine

## 2022-02-25 NOTE — Telephone Encounter (Signed)
ATC patient about lab results, looks like she spoke with Margie already, LMTCB ?

## 2022-02-25 NOTE — Telephone Encounter (Signed)
I spoke to patient at 9:16a. She called in at 8:07. ?Will close encounter  ?

## 2022-03-11 ENCOUNTER — Encounter: Payer: Self-pay | Admitting: Family Medicine

## 2022-03-11 ENCOUNTER — Ambulatory Visit (INDEPENDENT_AMBULATORY_CARE_PROVIDER_SITE_OTHER): Payer: Managed Care, Other (non HMO) | Admitting: Family Medicine

## 2022-03-11 VITALS — BP 132/74 | HR 87 | Temp 97.3°F | Resp 16 | Ht 66.0 in | Wt 262.5 lb

## 2022-03-11 DIAGNOSIS — Z1231 Encounter for screening mammogram for malignant neoplasm of breast: Secondary | ICD-10-CM

## 2022-03-11 DIAGNOSIS — R2242 Localized swelling, mass and lump, left lower limb: Secondary | ICD-10-CM | POA: Diagnosis not present

## 2022-03-11 DIAGNOSIS — Z6841 Body Mass Index (BMI) 40.0 and over, adult: Secondary | ICD-10-CM

## 2022-03-11 MED ORDER — PHENTERMINE HCL 37.5 MG PO TABS: 37.5000 mg | ORAL_TABLET | Freq: Every day | ORAL | 0 refills | Status: DC

## 2022-03-11 NOTE — Patient Instructions (Addendum)
[facility] - on [address]  ? ? ?

## 2022-03-11 NOTE — Progress Notes (Signed)
? ? ?Patient ID: Lori Oliver, female    DOB: 1971-02-15, 51 y.o.   MRN: [id number] ? ?PCP: [name], PA-C ? ?Chief Complaint  ?Patient presents with  ? Mass  ?  Left knee noticed it x2 weeks ago it feels tender, pt states when it hurts she rates it as 5. Pt denies injuring it.  ? ? ?Subjective:  ? ?Lori Oliver is a 52 y.o. female, presents to clinic with CC of the following: ? ?HPI  ?She presents for small lump over left kneecap - tender, onset a few weeks ago.  Its mobile ?Denies any recent or remote trauma ?No associated redness, effusion, swelling ? ?She will have new insurance soon - will need new referrals and orders for some of her screening tests ? ?Reviewed her weights, struggle with loosing weights, past meds that were effective, meds that were denied by insurance or too expensive ?She would like to try phentermine again while waiting for new insurance to new weight management consult ?Pulse Readings from Last 3 Encounters:  ?03/11/22 87  ?02/21/22 82  ?11/20/21 84  ? ?BP Readings from Last 3 Encounters:  ?03/11/22 132/74  ?02/21/22 124/70  ?11/20/21 118/74  ? ?She is still dealing with grief and loss of her husband, still hard to work and stay motivated, sometimes food a comfort.  All the times she came for help here referrals and meds and coupons did not help at all, very frustrated.  She doesn't feel depressed, just still grieving while trying to keep working and she wants to feel physically better. ? ?Weight and BMI fairly stable over the past 2 years despite repeated diet/lifestyle efforts by pt ?Wt Readings from Last 10 Encounters:  ?03/11/22 262 lb 8 oz (119.1 kg)  ?02/21/22 269 lb (122 kg)  ?11/20/21 262 lb 6.4 oz (119 kg)  ?10/22/21 258 lb 12.8 oz (117.4 kg)  ?05/23/21 264 lb 9.6 oz (120 kg)  ?03/19/21 258 lb 12.8 oz (117.4 kg)  ?01/03/21 263 lb 3.2 oz (119.4 kg)  ?11/01/20 263 lb 12.8 oz (119.7 kg)  ?10/27/20 266 lb 8 oz (120.9 kg)  ?10/23/20 261 lb 3.2 oz (118.5 kg)  ? ?BMI Readings  from Last 5 Encounters:  ?03/11/22 42.37 kg/m?  ?02/21/22 43.42 kg/m?  ?11/20/21 42.35 kg/m?  ?10/22/21 41.77 kg/m?  ?05/23/21 42.71 kg/m?  ? ? ? ?Patient Active Problem List  ? Diagnosis Date Noted  ? Upper airway cough syndrome 02/21/2022  ? Shortness of breath 10/22/2021  ? Congestive heart failure, unspecified HF chronicity, unspecified heart failure type ([place]) 03/19/2021  ? Nexplanon in place 01/03/2021  ? Prediabetes 07/17/2020  ? Hyperlipidemia 07/17/2020  ? 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 Gastroesophageal reflux disease without esophagitis 07/17/2020  ? Uterine leiomyoma 07/17/2020  ? Atrial tachycardia ([place]) 05/17/2019  ? Chest tightness 05/17/2019  ? Intermittent atrial fibrillation ([place]) 02/19/2018  ? Hypokalemia 02/10/2018  ? Anxiety 01/13/2018  ? Atrial fibrillation with RVR ([place]) 12/01/2017  ? Vitamin D deficiency 07/08/2017  ? Lower back pain 07/07/2017  ? Class 3 severe obesity due to excess calories with serious comorbidity and body mass index (BMI) of 40.0 to 44.9 in adult [facility]) 07/07/2017  ? Essential hypertension 07/11/2016  ? IFG (impaired fasting glucose) 05/16/2016  ? 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 05/16/2016  ? Abnormal thyroid function test 05/16/2016  ? Decreased libido 05/16/2016  ? Breast lump on left side at 5 o'clock position 05/16/2016  ? Bilateral thoracic back pain 06/01/2015  ? History of abnormal  cervical Pap smear 06/01/2015  ? ? ? ? ?Current Outpatient Medications:  ?  albuterol (VENTOLIN HFA) 108 (90 Base) MCG/ACT inhaler, Inhale 2 puffs into the lungs every 4 (four) hours as needed for wheezing or shortness of breath., Disp: 8 g, Rfl: 0 ?  famotidine (PEPCID) 20 MG tablet, One after supper, Disp: 30 tablet, Rfl: 11 ?  pantoprazole (PROTONIX) 40 MG tablet, Take 1 tablet (40 mg total) by mouth daily. Take 30-60 min before first meal of the day, Disp: 30 tablet, Rfl: 2 ?  predniSONE (DELTASONE) 10 MG tablet, Take  4 each am x 2 days,   2 each am x 2 days,  1 each am x 2 days and  stop, Disp: 14 tablet, Rfl: 0 ? ? ?No Known Allergies ? ? ?Social History  ? ?Tobacco Use  ? Smoking status: Never  ? Smokeless tobacco: Never  ?Vaping Use  ? Vaping Use: Never used  ?Substance Use Topics  ? Alcohol use: Not Currently  ?  Alcohol/week: 0.0 standard drinks  ?  Comment: occ  ? Drug use: No  ?  ? ? ?Chart Review Today: ?I personally reviewed active problem list, medication list, allergies, family history, social history, health maintenance, notes from last encounter, lab results, imaging with the patient/caregiver today. ? ? ?Review of Systems  ?Constitutional: Negative.   ?HENT: Negative.    ?Eyes: Negative.   ?Respiratory: Negative.    ?Cardiovascular: Negative.   ?Gastrointestinal: Negative.   ?Endocrine: Negative.   ?Genitourinary: Negative.   ?Musculoskeletal: Negative.   ?Skin: Negative.   ?Allergic/Immunologic: Negative.   ?Neurological: Negative.   ?Hematological: Negative.   ?Psychiatric/Behavioral: Negative.    ?All other systems reviewed and are negative. ? ?   ?Objective:  ? ?Vitals:  ? 03/11/22 1353  ?BP: 132/74  ?Pulse: 87  ?Resp: 16  ?Temp: (!) 97.3 ?F (36.3 ?C)  ?TempSrc: Oral  ?SpO2: 98%  ?Weight: 262 lb 8 oz (119.1 kg)  ?Height: '5\' 6"'$  (1.676 m)  ?  ?Body mass index is 42.37 kg/m?. ? ?Physical Exam ?Vitals and nursing note reviewed.  ?Constitutional:   ?   General: She is not in acute distress. ?   Appearance: Normal appearance. She is well-developed. She is obese. She is not ill-appearing, toxic-appearing or diaphoretic.  ?   Interventions: Face mask in place.  ?HENT:  ?   Head: Normocephalic and atraumatic.  ?   Right Ear: External ear normal.  ?   Left Ear: External ear normal.  ?Eyes:  ?   General: Lids are normal. No scleral icterus.    ?   Right eye: No discharge.     ?   Left eye: No discharge.  ?   Conjunctiva/sclera: Conjunctivae normal.  ?Neck:  ?   Trachea: Phonation normal. No tracheal deviation.  ?Cardiovascular:  ?   Rate and Rhythm: Normal rate and regular rhythm.  ?    Pulses: Normal pulses.     ?     Radial pulses are 2+ on the right side and 2+ on the left side.  ?     Posterior tibial pulses are 2+ on the right side and 2+ on the left side.  ?   Heart sounds: Normal heart sounds. No murmur heard. ?  No friction rub. No gallop.  ?Pulmonary:  ?   Effort: Pulmonary effort is normal. No respiratory distress.  ?   Breath sounds: Normal breath sounds. No stridor. No wheezing, rhonchi or rales.  ?Chest:  ?  Chest wall: No tenderness.  ?Abdominal:  ?   General: Bowel sounds are normal. There is no distension.  ?   Palpations: Abdomen is soft.  ?Musculoskeletal:  ?   Left knee: No swelling, effusion, erythema, ecchymosis or lacerations. Normal range of motion. No tenderness.  ?   Right lower leg: No edema.  ?   Left lower leg: No edema.  ?   Comments: Mobile, firm nodule less than 1 cm diameter able to move over left patella and lateral to patella, no fluctuance, erythema ?Not painful to move but tender with nodule is directly palpated  ?Skin: ?   General: Skin is warm and dry.  ?   Coloration: Skin is not jaundiced or pale.  ?   Findings: No rash.  ?Neurological:  ?   Mental Status: She is alert.  ?   Motor: No abnormal muscle tone.  ?   Gait: Gait normal.  ?Psychiatric:     ?   Attention and Perception: Attention normal.     ?   Mood and Affect: Mood normal. Affect is tearful.     ?   Speech: Speech normal.     ?   Behavior: Behavior normal. Behavior is cooperative.     ?   Thought Content: Thought content normal.  ?  ? ?Results for orders placed or performed during the hospital encounter of 02/21/22  ?TSH  ?Result Value Ref Range  ? TSH 2.277 0.350 - 4.500 uIU/mL  ?IgE  ?Result Value Ref Range  ? IgE (Immunoglobulin E), Serum 49 6 - 495 IU/mL  ?CBC with Differential/Platelet  ?Result Value Ref Range  ? WBC 9.7 4.0 - 10.5 K/uL  ? RBC 4.01 3.87 - 5.11 MIL/uL  ? Hemoglobin 12.6 12.0 - 15.0 g/dL  ? HCT 38.0 36.0 - 46.0 %  ? MCV 94.8 80.0 - 100.0 fL  ? MCH 31.4 26.0 - 34.0 pg  ? MCHC  33.2 30.0 - 36.0 g/dL  ? RDW 12.4 11.5 - 15.5 %  ? Platelets 288 150 - 400 K/uL  ? nRBC 0.0 0.0 - 0.2 %  ? Neutrophils Relative % 46 %  ? Neutro Abs 4.4 1.7 - 7.7 K/uL  ? Lymphocytes Relative 44 %  ? Lymphs

## 2022-03-18 ENCOUNTER — Encounter: Payer: Self-pay | Admitting: Emergency Medicine

## 2022-03-18 ENCOUNTER — Ambulatory Visit (INDEPENDENT_AMBULATORY_CARE_PROVIDER_SITE_OTHER): Payer: BC Managed Care – PPO | Admitting: Cardiovascular Disease

## 2022-03-18 ENCOUNTER — Telehealth: Payer: Self-pay | Admitting: Cardiovascular Disease

## 2022-03-18 ENCOUNTER — Encounter: Payer: Self-pay | Admitting: Cardiovascular Disease

## 2022-03-18 VITALS — BP 120/88 | HR 90 | Ht 66.0 in | Wt 265.4 lb

## 2022-03-18 DIAGNOSIS — R0602 Shortness of breath: Secondary | ICD-10-CM

## 2022-03-18 DIAGNOSIS — R079 Chest pain, unspecified: Secondary | ICD-10-CM

## 2022-03-18 DIAGNOSIS — I480 Paroxysmal atrial fibrillation: Secondary | ICD-10-CM

## 2022-03-18 MED ORDER — DILTIAZEM HCL 30 MG PO TABS: 30.0000 mg | ORAL_TABLET | Freq: Three times a day (TID) | ORAL | 1 refills | Status: DC | PRN

## 2022-03-18 MED ORDER — PROPRANOLOL HCL 20 MG PO TABS: 20.0000 mg | ORAL_TABLET | Freq: Three times a day (TID) | ORAL | 1 refills | Status: DC | PRN

## 2022-03-18 NOTE — Progress Notes (Signed)
Work note

## 2022-03-18 NOTE — Patient Instructions (Addendum)
Medication Instructions:  ?Take diltiazem and/or propranolol , refilled ? ?If you need a refill on your cardiac medications before your next appointment, please call your pharmacy.  ? ?Lab work: ?No new labs needed ? ?Testing/Procedures: ?No new testing needed ? ?Follow-Up: ?At [facility], you and your health needs are our priority.  As part of our continuing mission to provide you with exceptional heart care, we have created designated Provider Care Teams.  These Care Teams include your primary Cardiologist (physician) and Advanced Practice Providers (APPs -  Physician Assistants and Nurse Practitioners) who all work together to provide you with the care you need, when you need it. ? ?You will need a follow up appointment in 12 months ? ?Providers on your designated Care Team:   ?Murray Hodgkins, NP ?Christell Faith, PA-C ?Cadence Kathlen Mody, PA-C ? ?COVID-19 Vaccine Information can be found at: [email or web address] For questions related to vaccine distribution or appointments, please email [email or web address] or call [phone].  ? ?

## 2022-03-18 NOTE — Progress Notes (Addendum)
?  ? ? ?Date:  03/18/2022  ? ?ID:  Lori Oliver, DOB 1971/01/01, MRN [id number] ? ?Patient Location:  ?[place] ?[place] [id number]  ? ?Provider location:   ?[place], [employer] office ? ?PCP:  [name], PA-C  ?Cardiologist:  [facility] ? ?Chief Complaint  ?Patient presents with  ? Atrial Fibrillation  ?  Patient c/o shortness of breath, weakness, left arm pain and chest pain that started yesterday. Medications reviewed by the patient verbally.   ? ? ? ?History of Present Illness:   ? ?Lori Oliver is a 51 y.o. female past medical history of ?anxiety ?Atrial fib in 12/01/2017 after having taken  Sudafed and cough medicine. ?HTN ?Morbid obesity  ?March 13, 2019 hospitalization for atrial fibrillation ?Who presents for follow-up of her paroxysmal atrial fibrillation, atypical chest pain ? ?Last seen by myself in clinic August 2021 ?At that time had diltiazem 30 as needed propranolol 20 as needed for breakthrough tachycardia/atrial fibrillation ? ?Yesterday afternoon, after getting out of a meeting, she noticed her HR went up to 197 bpm per  Apple Watch.  ?She went home and laid down, but did notice that her HR was variable for at least an hour. ? ?feel hot/ SOB/ weak/ with some chest pain during this episode of fast heart rates.  ?  ?The morning, she continues to feel weak/ fatigued/ some pain in her neck, although her chest pain is gone.  ? ?took a dose of  ditiazem x 1 dose.  ? ?Stress at work, ?One of her friends lost their husband, stressful ? ?EKG personally reviewed by myself on todays visit ?Shows normal sinus rhythm rate 90 bpm no significant ST-T wave changes ? ?Past medical history reviewed ?event monitor May 2020 ?No atrial fib ?triggered events were not associated with significant arrhythmia ?Rare short runs of atrial tachycardia ? ?Husband passed from cancer, colon, ? ?March 13, 2019 hospitalization for atrial fibrillation ?sitting on her couch at rest watching television   ?acute onset of tachycardia with some chest tightness shortness of breath.  She does have propranolol but did not think to take this ?Last episode of atrial fibrillation December 2018 after taking Sudafed and other cough medicine ?  heart rates in the low 200 range ?Started on diltiazem infusion ?Was kept in the hospital for pulmonary edema, started on Lasix, diltiazem for rate control ? ?Converted back to normal sinus rhythm on diltiazem ?chads vasc 2 (female and hypertension) ?She prefered not to be on anticoagulation ? ?-Risk factors include obesity, possible sleep apnea, hypertension ? LVH on echocardiogram possibly secondary to longstanding hypertension ? ?Recent emergency room visits as detailed below ?ER March 27, 2018 for chest pain atypical ?ER 02/28/2018 for fall, knee pain ?ER 02/11/2018 for arm pain ?ER 02/08/2018 for palpitations and shortness of breath. ?ER 2/8/209 with HTN ?ER 01/02/2018 for nausea ?ER 12/23/2017 for chest pain, atypical ?ER 12/21/2017 for SOB, was tearful ?ER 12/06/2017 for palplitations, , NSR ?  ?Hospital 12/01/2017 for atrial fib with RVR, seen by dr. Humphrey Rolls ?Heart rate was 190 bpm, developed after taking cold medication ?Started on sotolol, Noac ?As an outpatient had fatigue, did not feel comfortable taking anticoagulation ?  ?CT scan chest reviewed  from 2018 showing no coronary calcification or aortic atherosclerosis ? ?Sleep test  mild sleep apnea ?She declined CPAP ?[facility] through [facility] ? ?Echocardiogram March 14, 2019 ? 1. The left ventricle has normal systolic function with an ejection fraction of 60-65%. The  cavity size was normal. There is mild to moderately increased left ventricular wall thickness. Left ventricular diastolic Doppler parameters are indeterminate. ? 2. The right ventricle has normal systolic function. The cavity was normal. There is no increase in right ventricular wall thickness. ? 3. Rhythm is atrial fibrillation ? ?Past Medical History:  ?Diagnosis Date  ?  Abnormal thyroid function test 05/16/2016  ? Anxiety   ? Atrial fibrillation ([place])   ? 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 05/16/2016  ? Congestive heart failure, unspecified HF chronicity, unspecified heart failure type ([place]) 03/19/2021  ? Decreased libido 05/16/2016  ? Dysrhythmia   ? Gastroesophageal reflux disease without esophagitis 07/17/2020  ? Hyperlipidemia 07/17/2020  ? Hypertension   ? Hypokalemia   ? Impaired fasting glucose   ? Morbid obesity ([place]) 06/01/2015  ? Morbid obesity ([place])   ? Plantar fasciitis   ? 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 ?Past Surgical History:  ?Procedure Laterality Date  ? BREAST BIOPSY Right 2015  ? benign  ? ESOPHAGOGASTRODUODENOSCOPY (EGD) WITH PROPOFOL N/A 08/12/2018  ? Procedure: ESOPHAGOGASTRODUODENOSCOPY (EGD) WITH PROPOFOL;  Surgeon: Toledo, Benay Pike, MD;  Location: [facility] ENDOSCOPY;  Service: Gastroenterology;  Laterality: N/A;  ? HAMMER TOE SURGERY    ?  ? ?Current Meds  ?Medication Sig  ? [DISCONTINUED] diltiazem (CARDIZEM) 30 MG tablet Take 30 mg by mouth 3 (three) times daily as needed.  ? [DISCONTINUED] propranolol (INDERAL) 20 MG tablet Take 20 mg by mouth 3 (three) times daily as needed.  ?  ? ?Allergies:   Patient has no known allergies.  ? ?Social History  ? ?Tobacco Use  ? Smoking status: Never  ? Smokeless tobacco: Never  ?Vaping Use  ? Vaping Use: Never used  ?Substance Use Topics  ? Alcohol use: Not Currently  ?  Alcohol/week: 0.0 standard drinks  ?  Comment: occ  ? Drug use: No  ?  ? ?Current Outpatient Medications on File Prior to Visit  ?Medication Sig Dispense Refill  ? albuterol (VENTOLIN HFA) 108 (90 Base) MCG/ACT inhaler Inhale 2 puffs into the lungs every 4 (four) hours as needed for wheezing or shortness of breath. (Patient not taking: Reported on 03/11/2022) 8 g 0  ? famotidine (PEPCID) 20 MG tablet One after supper (Patient not taking: Reported on 03/11/2022) 30 tablet 11  ? pantoprazole (PROTONIX) 40 MG tablet Take 1 tablet (40 mg total) by mouth daily. Take 30-60 min  before first meal of the day (Patient not taking: Reported on 03/11/2022) 30 tablet 2  ? phentermine (ADIPEX-P) 37.5 MG tablet Take 1 tablet (37.5 mg total) by mouth daily before breakfast. (Patient not taking: Reported on 03/18/2022) 30 tablet 0  ? predniSONE (DELTASONE) 10 MG tablet Take  4 each am x 2 days,   2 each am x 2 days,  1 each am x 2 days and stop (Patient not taking: Reported on 03/11/2022) 14 tablet 0  ? ?No current facility-administered medications on file prior to visit.  ?  ? ?Family Hx: ?The patient's family history includes Arthritis in her mother; Depression in her mother; Heart disease in her mother; Hypertension in her mother; Stroke in her maternal grandmother. ? ?ROS:   ?Please see the history of present illness.    ?Review of Systems  ?Constitutional: Negative.   ?Respiratory: Negative.    ?Cardiovascular: Negative.   ?Gastrointestinal: Negative.   ?Musculoskeletal: Negative.   ?Neurological: Negative.   ?Psychiatric/Behavioral:  The patient is nervous/anxious.   ?All other systems reviewed and are negative.  ? ?  Labs/Other Tests and Data Reviewed:   ? ?Recent Labs: ?03/19/2021: ALT 11; BUN 16; Creat 0.89; Potassium 4.3; Sodium 139 ?02/21/2022: Hemoglobin 12.6; Platelets 288; TSH 2.277  ? ?Recent Lipid Panel ?Lab Results  ?Component Value Date/Time  ? CHOL 148 03/19/2021 12:22 PM  ? CHOL 177 07/07/2017 03:43 PM  ? CHOL 165 03/09/2014 05:29 PM  ? TRIG 66 03/19/2021 12:22 PM  ? TRIG 96 03/09/2014 05:29 PM  ? HDL 45 (L) 03/19/2021 12:22 PM  ? HDL 49 07/07/2017 03:43 PM  ? HDL 60 03/09/2014 05:29 PM  ? CHOLHDL 3.3 03/19/2021 12:22 PM  ? [place] 88 03/19/2021 12:22 PM  ? [place] 86 03/09/2014 05:29 PM  ? ? ?Wt Readings from Last 3 Encounters:  ?03/18/22 120.4 kg  ?03/11/22 119.1 kg  ?02/21/22 122 kg  ?  ?Exam:   ? ?Vital Signs: Vital signs may also be detailed in the HPI ?BP 120/88 (BP Location: Left Arm, Patient Position: Sitting, Cuff Size: Large)   Pulse 90   Ht '5\' 6"'$  (1.676 m)   Wt 120.4 kg   LMP  09/09/2020 Comment: Dec 2-5, 2021 spotting really light  SpO2 99%   BMI 42.83 kg/m?   ?Constitutional:  oriented to person, place, and time. No distress.  ?HENT:  ?Head: Grossly normal ?Eyes:  no discharge. N

## 2022-03-18 NOTE — Telephone Encounter (Signed)
Pt c/o Shortness Of Breath: STAT if SOB developed within the last 24 hours or pt is noticeably SOB on the phone ? ?1. Are you currently SOB (can you hear that pt is SOB on the phone)? Yes  ? ?2. How long have you been experiencing SOB? yesterday ? ?3. Are you SOB when sitting or when up moving around? Moving around ? ?4. Are you currently experiencing any other symptoms? Chest pain, fatigued, sap of energy, pain on left side  ?

## 2022-03-18 NOTE — Telephone Encounter (Signed)
Call transferred directly to this RN from scheduling. ? ?The patient was last seen in 07/2020 by Dr. [name] for a history of atrial fibrillation, first noted in 11/2017 after taking Sudafed and cough medication. ? ?The patient called today stating she had been doing well since her last visit with Dr. [name], but yesterday afternoon, after getting out of a meeting, she noticed her HR went up to 197 bpm per his Apple Watch.  ?She went home and laid down, but did notice that her HR was variable for at least an hour. ? ?She did feel hot/ SOB/ weak/ with some chest pain during this episode of fast heart rates.  ? ?The morning, she continues to feel weak/ fatigued/ some pain in her neck, although her chest pain is gone.  ? ?The patient advised that she had some PRN Diltiazem still left at home that was previously prescribed to her, so she took a dose of this yesterday afternoon x 1 dose.  ? ?She checked her HR while on the phone with me this morning and she was 78 bpm. ?She has not checked her BP. ? ?I inquired if she had had any caffeine/ stimulants of any kind prior to her episode yesterday and she advised that she had not.  ? ?I advised the patient that we could bring her in to follow up today.  ?She is aware that since it has been almost 2 years since her last office visit, it will not hurt to have her come in and renew her PRN RX's if needed.  ?The patient advised that the reason for her initial call was that she did want to be seen today. ? ?I have offered her an appointment with Dr. [name] at 2:20 pm and is agreeable. ? ?The patient is advised that fatigue/ weakness can be normal after a burst of elevated heart rates. ?I have asked her to please take it easy this morning and make sure she is staying  hydrated.  ?ER precautions given if needed prior to her visit this afternoon.  ? ?The patient voices understanding of the above and is agreeable.  ? ?

## 2022-03-19 ENCOUNTER — Encounter: Payer: Self-pay | Admitting: Emergency Medicine

## 2022-03-19 NOTE — Progress Notes (Signed)
Patient to front desk stating that she feels better and would like to return to work today. New letter drafted ?

## 2022-03-27 ENCOUNTER — Telehealth: Payer: Self-pay | Admitting: Family Medicine

## 2022-03-27 NOTE — Telephone Encounter (Signed)
Copied from [place] [phone]. Topic: Referral - Request for Referral ?>> Mar 27, 2022 10:14 AM Leward Quan A wrote: ?Has patient seen PCP for this complaint? Yes.   ?*If NO, is insurance requiring patient see PCP for this issue before PCP can refer them? ?Referral for which specialty: Surgeon for breast reduction  ?Preferred provider/office: IN [place] or [place] area  ?Reason for referral: To have breast reduction ?

## 2022-03-27 NOTE — Telephone Encounter (Signed)
Do you need for her to come in for this matter? ?

## 2022-03-28 NOTE — Telephone Encounter (Signed)
Spoke with pt and she has asked if Leisa would resubmitt her referral for weight loss. Stated that her insurance had denied it before but she has now switched insurances Nurse, mental health) and she know they will cover it. She will hold off on the surgery for the breast reduction.

## 2022-03-28 NOTE — Telephone Encounter (Signed)
Lori Oliver, would we need to put in a new referral due to her insurance or could she call the place and let them know her insurance is now [employer]. ?

## 2022-03-28 NOTE — Telephone Encounter (Signed)
Called pt to inform her and pt stated she wants a Pharmacist, community message with the information since she is at work and does not have time to take any information. ?

## 2022-04-15 ENCOUNTER — Ambulatory Visit: Payer: Managed Care, Other (non HMO) | Admitting: Family Medicine

## 2022-04-18 ENCOUNTER — Ambulatory Visit: Payer: Managed Care, Other (non HMO) | Admitting: Internal Medicine

## 2022-04-18 NOTE — Progress Notes (Deleted)
Lori Oliver, female    DOB: 1971/04/01,   MRN: [id number]   Brief patient profile:  51 ywbf   never smoker NA with Hospice with onset in early 40s rhinitis spring time sneezing then  2019 heart GERD then referred to pulmonary clinic in [facility]  02/21/2022 by Lori Royals FNP  for cough Lori Oliver onset early 2021-11-17   Husband passed away May 17, 2020 / baseline wt  130 p last baby born 25      History of Present Illness  02/21/2022  Pulmonary/ 1st office eval/ Lori Oliver / [employer] Complaint  Patient presents with   pulmonary consult    Per Lori Grana, PA--occ prod cough with clear sputum mainly in the morning and wheezing with laying flat and with exertion.  Dyspnea: ok at home, thinks mask makes it hard to breathe at work Cough: onset p stirs then "feels like something stuck" in throat but does not wake her up/ min mucoid prod Sleep: flat bed props up on pillows  SABA use: prednisone worked the best/ inhalers don't  Rec Ask [name] for substitutes for propranolol (metaprolol is ok especially in low doses) Pantoprazole (protonix) 40 mg   Take  30-60 min before first meal of the day and Pepcid (famotidine)  20 mg after supper until return to office - this is the best way to tell whether stomach acid is contributing to your problem.  GERD diet reviewed, bed blocks rec   Depomedrol 120 mg IM    If not better add >  Prednisone 10 mg take  4 each am x 2 days,   2 each am x 2 days,  1 each am x 2 days and stop  Please remember to go to the lab department   >  Eos 0.4 /  IgE  49     04/18/2022  f/u ov/Lori Oliver/ [name] re: cough/wheeze on propranolol   maint on ***  No chief complaint on file.   Dyspnea:  *** Cough: *** Sleeping: *** SABA use: *** 02: *** Covid status:   ***   No obvious day to day or daytime variability or assoc excess/ purulent sputum or mucus plugs or hemoptysis or cp or chest tightness, subjective wheeze or overt sinus or hb symptoms.   ***  without nocturnal  or early am exacerbation  of respiratory  c/o's or need for noct saba. Also denies any obvious fluctuation of symptoms with weather or environmental changes or other aggravating or alleviating factors except as outlined above   No unusual exposure hx or h/o childhood pna/ asthma or knowledge of premature birth.  Current Allergies, Complete Past Medical History, Past Surgical History, Family History, and Social History were reviewed in [employer] record.  ROS  The following are not active complaints unless bolded Hoarseness, sore throat, dysphagia, dental problems, itching, sneezing,  nasal congestion or discharge of excess mucus or purulent secretions, ear ache,   fever, chills, sweats, unintended wt loss or wt gain, classically pleuritic or exertional cp,  orthopnea pnd or arm/hand swelling  or leg swelling, presyncope, palpitations, abdominal pain, anorexia, nausea, vomiting, diarrhea  or change in bowel habits or change in bladder habits, change in stools or change in urine, dysuria, hematuria,  rash, arthralgias, visual complaints, headache, numbness, weakness or ataxia or problems with walking or coordination,  change in mood or  memory.        No outpatient medications have been marked as taking  for the 04/18/22 encounter (Appointment) with Lori Rockers, MD.               Past Medical History:  Diagnosis Date   Abnormal thyroid function test 05/16/2016   Anxiety    Atrial fibrillation ([place])    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 05/16/2016   Congestive heart failure, unspecified HF chronicity, unspecified heart failure type ([place]) 03/19/2021   Decreased libido 05/16/2016   Dysrhythmia    Gastroesophageal reflux disease without esophagitis 07/17/2020   Hyperlipidemia 07/17/2020   Hypertension    Hypokalemia    Impaired fasting glucose    Morbid obesity ([place]) 06/01/2015   Morbid obesity ([place])    Plantar fasciitis    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        Objective:       Wt Readings from Last 3 Encounters:  03/18/22 265 lb 6 oz (120.4 kg)  03/11/22 262 lb 8 oz (119.1 kg)  02/21/22 269 lb (122 kg)      Vital signs reviewed  04/18/2022  - Note at rest 02 sats  ***% on ***   General appearance:    ***            Assessment

## 2022-05-01 ENCOUNTER — Ambulatory Visit
Admission: RE | Admit: 2022-05-01 | Discharge: 2022-05-01 | Disposition: A | Discharge status: Home / Self Care | Payer: BC Managed Care – PPO | Source: Ambulatory Visit | Attending: Family Medicine | Admitting: Family Medicine

## 2022-05-01 DIAGNOSIS — Z1231 Encounter for screening mammogram for malignant neoplasm of breast: Secondary | ICD-10-CM | POA: Diagnosis not present

## 2022-05-08 ENCOUNTER — Telehealth: Payer: Self-pay

## 2022-05-08 NOTE — Telephone Encounter (Signed)
Copied from [place] [id number]. Topic: Referral - Question >> May 08, 2022  1:27 PM Valere Dross wrote: Reason for CRM: Pt called in stating when she first got her referral for weight loss at the [facility] [employer] insurance wasn't supported, but pt states now she has new insurance that will, and they requested if PCP could send over a new referral, please advise.

## 2022-05-09 NOTE — Addendum Note (Signed)
Addended by: [name] on: 05/09/2022 01:12 PM   Modules accepted: Orders

## 2022-05-16 ENCOUNTER — Telehealth: Payer: Self-pay | Admitting: Internal Medicine

## 2022-05-16 MED ORDER — PREDNISONE 10 MG PO TABS: ORAL_TABLET | ORAL | 0 refills | Status: DC

## 2022-05-16 NOTE — Telephone Encounter (Signed)
The response to prednisone is great but it won't give long term relief whereas the gerd rx may and only way to know is stay on it for 6 weeks then ov while still on it  (gerd rx does not have the side effects is prednisone)   One more round  ok: Prednisone 10 mg take  4 each am x 2 days,   2 each am x 2 days,  1 each am x 2 days and stop

## 2022-05-16 NOTE — Telephone Encounter (Signed)
Patient is aware of recommendations and voiced her understanding.  Rx sent to preferred pharmacy.  Nothing further needed.   

## 2022-05-16 NOTE — Telephone Encounter (Signed)
Spoke to patient.  C/o wheezing, occ dry cough at prod with clear sputum in the morning and increased SOB with exertion x1w. Denied f/c/s or additional sx.   She is not using protonix or Pepcid. She did not start these meds, as her sx improved with prednisone.  She is using albuterol 1-2x daily.  Dr. [name], please advise. Thanks

## 2022-05-16 NOTE — Telephone Encounter (Signed)
Lm x1 for patient.  

## 2022-05-21 ENCOUNTER — Ambulatory Visit (INDEPENDENT_AMBULATORY_CARE_PROVIDER_SITE_OTHER): Payer: BC Managed Care – PPO | Admitting: Family Medicine

## 2022-05-21 ENCOUNTER — Encounter: Payer: Self-pay | Admitting: Family Medicine

## 2022-05-21 VITALS — BP 132/82 | HR 84 | Temp 98.1°F | Resp 16 | Ht 66.0 in | Wt 263.6 lb

## 2022-05-21 DIAGNOSIS — J329 Chronic sinusitis, unspecified: Secondary | ICD-10-CM

## 2022-05-21 DIAGNOSIS — J209 Acute bronchitis, unspecified: Secondary | ICD-10-CM

## 2022-05-21 DIAGNOSIS — J310 Chronic rhinitis: Secondary | ICD-10-CM | POA: Diagnosis not present

## 2022-05-21 DIAGNOSIS — R059 Cough, unspecified: Secondary | ICD-10-CM

## 2022-05-21 MED ORDER — BENZONATATE 100 MG PO CAPS: 100.0000 mg | ORAL_CAPSULE | Freq: Three times a day (TID) | ORAL | 0 refills | Status: DC | PRN

## 2022-05-21 MED ORDER — PROMETHAZINE-DM 6.25-15 MG/5ML PO SYRP: 5.0000 mL | ORAL_SOLUTION | Freq: Four times a day (QID) | ORAL | 1 refills | Status: DC | PRN

## 2022-05-21 MED ORDER — PREDNISONE 20 MG PO TABS: 40.0000 mg | ORAL_TABLET | Freq: Every day | ORAL | 0 refills | Status: AC

## 2022-05-21 MED ORDER — ALBUTEROL SULFATE (2.5 MG/3ML) 0.083% IN NEBU
2.5000 mg | INHALATION_SOLUTION | Freq: Once | RESPIRATORY_TRACT | Status: AC
  Administered 2022-05-21: 2.5 mg via RESPIRATORY_TRACT

## 2022-05-21 NOTE — Progress Notes (Signed)
Patient ID: Lori Oliver, female    DOB: [birth year], 51 y.o.   MRN: [id number]  PCP: [name], PA-C  Chief Complaint  Patient presents with   Cough    Started on Saturday has it all day but worst at nighttime. Coughing up mucus    Subjective:   Lori Oliver is a 51 y.o. female, presents to clinic with CC of the following:  HPI  Pt presents with cough and wheeze, she got a prednisone burst last week after sending msg to pulmonary (Dr. [name]) after a few days of sx, but her symptoms have not improved like it normally does.  Cough has become more productive with green sputum, today runny nose started some associated sinus congestion and HA and some generalized fatigue. Some sob with like walking up steps, some wheezing and DOE  She has been doing inhaler a few times, and it helps temporarily. She has not tried any allergy meds, congestions meds, cough meds otc Sx for about 7-8 days, covid negative at home, denies fever, chills, sweats, CP, sinus pain, sore throat, lymphadenopathy, GI sx   Patient Active Problem List   Diagnosis Date Noted   Upper airway cough syndrome 02/21/2022   Shortness of breath 10/22/2021   Congestive heart failure, unspecified HF chronicity, unspecified heart failure type ([place]) 03/19/2021   Nexplanon in place 01/03/2021   Prediabetes 07/17/2020   Hyperlipidemia 07/17/2020   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   Gastroesophageal reflux disease without esophagitis 07/17/2020   Uterine leiomyoma 07/17/2020   Atrial tachycardia ([place]) 05/17/2019   Chest tightness 05/17/2019   Intermittent atrial fibrillation ([place]) 02/19/2018   Hypokalemia 02/10/2018   Anxiety 01/13/2018   Atrial fibrillation with RVR ([place]) 12/01/2017   Vitamin D deficiency 07/08/2017   Lower back pain 07/07/2017   Class 3 severe obesity due to excess calories with serious comorbidity and body mass index (BMI) of 40.0 to 44.9 in adult ([place]) 07/07/2017   Essential hypertension  07/11/2016   IFG (impaired fasting glucose) 05/16/2016   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 05/16/2016   Abnormal thyroid function test 05/16/2016   Decreased libido 05/16/2016   Breast lump on left side at 5 o'clock position 05/16/2016   Bilateral thoracic back pain 06/01/2015   History of abnormal cervical Pap smear 06/01/2015      Current Outpatient Medications:    albuterol (VENTOLIN HFA) 108 (90 Base) MCG/ACT inhaler, Inhale 2 puffs into the lungs every 4 (four) hours as needed for wheezing or shortness of breath., Disp: 8 g, Rfl: 0   diltiazem (CARDIZEM) 30 MG tablet, Take 1 tablet (30 mg total) by mouth 3 (three) times daily as needed., Disp: 60 tablet, Rfl: 1   famotidine (PEPCID) 20 MG tablet, One after supper, Disp: 30 tablet, Rfl: 11   pantoprazole (PROTONIX) 40 MG tablet, Take 1 tablet (40 mg total) by mouth daily. Take 30-60 min before first meal of the day, Disp: 30 tablet, Rfl: 2   predniSONE (DELTASONE) 10 MG tablet, 4tab x2d, 2tabx2d, 1tabx2d, Disp: 14 tablet, Rfl: 0   propranolol (INDERAL) 20 MG tablet, Take 1 tablet (20 mg total) by mouth 3 (three) times daily as needed., Disp: 60 tablet, Rfl: 1   phentermine (ADIPEX-P) 37.5 MG tablet, Take 1 tablet (37.5 mg total) by mouth daily before breakfast. (Patient not taking: Reported on 03/18/2022), Disp: 30 tablet, Rfl: 0   No Known Allergies   Social History   Tobacco Use   Smoking status: Never   Smokeless tobacco:  Never  Vaping Use   Vaping Use: Never used  Substance Use Topics   Alcohol use: Not Currently    Alcohol/week: 0.0 standard drinks    Comment: occ   Drug use: No      Chart Review Today: I personally reviewed active problem list, medication list, allergies, family history, social history, health maintenance, notes from last encounter, lab results, imaging with the patient/caregiver today.   Review of Systems     Objective:   Vitals:   05/21/22 0904  BP: 132/82  Pulse: 84  Resp: 16  Temp: 98.1 F  (36.7 C)  TempSrc: Oral  SpO2: 96%  Weight: 263 lb 9.6 oz (119.6 kg)  Height: '5\' 6"'$  (1.676 m)    Body mass index is 42.55 kg/m.  Physical Exam Vitals and nursing note reviewed.  Constitutional:      General: She is not in acute distress.    Appearance: Normal appearance. She is well-developed. She is obese. She is not ill-appearing, toxic-appearing or diaphoretic.  HENT:     Head: Normocephalic and atraumatic.     Right Ear: Hearing, tympanic membrane, ear canal and external ear normal. There is no impacted cerumen.     Left Ear: Hearing, ear canal and external ear normal. There is impacted cerumen.     Nose: Mucosal edema and congestion present. No rhinorrhea.     Right Turbinates: Enlarged, swollen and pale.     Left Turbinates: Enlarged and swollen.     Right Sinus: No maxillary sinus tenderness or frontal sinus tenderness.     Left Sinus: No maxillary sinus tenderness or frontal sinus tenderness.     Comments: Erythematous nasal mucosa, L>R    Mouth/Throat:     Lips: Pink.     Mouth: Mucous membranes are moist. Mucous membranes are not pale.     Pharynx: Oropharynx is clear. Uvula midline. No pharyngeal swelling, oropharyngeal exudate, posterior oropharyngeal erythema or uvula swelling.     Tonsils: No tonsillar exudate or tonsillar abscesses.  Eyes:     General: No scleral icterus.       Right eye: No discharge.        Left eye: No discharge.     Conjunctiva/sclera: Conjunctivae normal.  Neck:     Trachea: No tracheal deviation.  Cardiovascular:     Rate and Rhythm: Normal rate and regular rhythm.     Pulses: Normal pulses.     Heart sounds: Normal heart sounds.  Pulmonary:     Effort: Pulmonary effort is normal. No tachypnea, accessory muscle usage, respiratory distress or retractions.     Breath sounds: No stridor, decreased air movement or transmitted upper airway sounds. Wheezing (inspiratory and expiratory wheeze to right lower to mid lung fields) present. No  decreased breath sounds, rhonchi or rales.  Abdominal:     General: Bowel sounds are normal. There is no distension.     Palpations: Abdomen is soft.  Musculoskeletal:        General: Normal range of motion.     Cervical back: Normal range of motion and neck supple.  Lymphadenopathy:     Cervical: No cervical adenopathy.  Skin:    General: Skin is warm and dry.     Capillary Refill: Capillary refill takes less than 2 seconds.     Coloration: Skin is not pale.     Findings: No rash.  Neurological:     Mental Status: She is alert.     Motor: No abnormal muscle tone.  Coordination: Coordination normal.  Psychiatric:        Mood and Affect: Mood normal.        Behavior: Behavior normal.     Results for orders placed or performed during the hospital encounter of 02/21/22  TSH  Result Value Ref Range   TSH 2.277 0.350 - 4.500 uIU/mL  IgE  Result Value Ref Range   IgE (Immunoglobulin E), Serum 49 6 - 495 IU/mL  CBC with Differential/Platelet  Result Value Ref Range   WBC 9.7 4.0 - 10.5 K/uL   RBC 4.01 3.87 - 5.11 MIL/uL   Hemoglobin 12.6 12.0 - 15.0 g/dL   HCT 38.0 36.0 - 46.0 %   MCV 94.8 80.0 - 100.0 fL   MCH 31.4 26.0 - 34.0 pg   MCHC 33.2 30.0 - 36.0 g/dL   RDW 12.4 11.5 - 15.5 %   Platelets 288 150 - 400 K/uL   nRBC 0.0 0.0 - 0.2 %   Neutrophils Relative % 46 %   Neutro Abs 4.4 1.7 - 7.7 K/uL   Lymphocytes Relative 44 %   Lymphs Abs 4.2 (H) 0.7 - 4.0 K/uL   Monocytes Relative 6 %   Monocytes Absolute 0.6 0.1 - 1.0 K/uL   Eosinophils Relative 4 %   Eosinophils Absolute 0.4 0.0 - 0.5 K/uL   Basophils Relative 0 %   Basophils Absolute 0.0 0.0 - 0.1 K/uL   Immature Granulocytes 0 %   Abs Immature Granulocytes 0.02 0.00 - 0.07 K/uL       Assessment & Plan:   Pt presents with 1 week of cough and URI sx that have gradually worsened, particularly cough is more productive and she has more nasal sx - congestion, discharge postnasal drip Was put on steroids last week  40 mg x 2d, then 20 mg x 2 d and currently on last 10 mg dose, not much improvement Not using inhaler much or trying OTC meds - she is concerned about how they may interact and affect her HR with steroids  Suspect viral URI and acute bronchitis Mild wheeze on exam cleared with one breathing tx - she refused a neb machine order - encouraged her to use inhaler more often  1. Acute bronchitis, unspecified organism No increased WOB, VSS, pt well appearing, mild wheeze cleared with neb - steroid burst - higher dose x 5 d, use inhaler more, cough meds sent in and encouraged mucinex - benzonatate (TESSALON) 100 MG capsule; Take 1-2 capsules (100-200 mg total) by mouth 3 (three) times daily as needed for cough.  Dispense: 30 capsule; Refill: 0 - predniSONE (DELTASONE) 20 MG tablet; Take 2 tablets (40 mg total) by mouth daily with breakfast for 5 days.  Dispense: 10 tablet; Refill: 0 - promethazine-dextromethorphan (PROMETHAZINE-DM) 6.25-15 MG/5ML syrup; Take 5 mLs by mouth 4 (four) times daily as needed for cough.  Dispense: 118 mL; Refill: 1 - albuterol (PROVENTIL) (2.5 MG/3ML) 0.083% nebulizer solution 2.5 mg  2. Rhinosinusitis Swelling and erythema - suspect baseline allergies acutely worsened with viral URI - advised to use OTC antihistamines, saline spray No sinus TTP, no acute worsening of sinus sx or fever, abx not currently indicated - explained to pt this and she was urged to f/up if she worsens all the sudden  3. Cough, unspecified type CXR on hold in case any worsening After neb today reexamined, lungs CTA A&P to wheeze, rales or rhonchi - suspect viral URI/acute bronchitis - tx with steroids, inhalers, cough meds, mucinex, f/up if  not improving Currently see no indication for abx  - DG Chest 2 View; Future - benzonatate (TESSALON) 100 MG capsule; Take 1-2 capsules (100-200 mg total) by mouth 3 (three) times daily as needed for cough.  Dispense: 30 capsule; Refill: 0 - predniSONE  (DELTASONE) 20 MG tablet; Take 2 tablets (40 mg total) by mouth daily with breakfast for 5 days.  Dispense: 10 tablet; Refill: 0 - promethazine-dextromethorphan (PROMETHAZINE-DM) 6.25-15 MG/5ML syrup; Take 5 mLs by mouth 4 (four) times daily as needed for cough.  Dispense: 118 mL; Refill: 1 - albuterol (PROVENTIL) (2.5 MG/3ML) 0.083% nebulizer solution 2.5 mg  AVS instructions typed up, printed, and reviewed verbally with pt Given work note for 2 days She should f/up if any acute worsening  Pt instructions: Continue other supportive and symptomatic treatment - push fluids, rest, take tylenol and ibuprofen as directed on bottle/box for fever and pain  Can continue over the counter cold and cough medicines, antihistamines, steroid nasal sprays, decongestants - I would recommend for sure starting a antihistamine daily at bedtime- like zyrtec, claritin, allegra to help reduce your nasal symptoms and nasal discharge.  You can do nasal saline spray and prop the head of your bed up at night to avoid as much nighttime coughing I also recommend you start mucinex daily and push a lot of fluids Try over the counter cough medications like delsym or robitussin if the syrup and perles I sent in do not help you much.   Viral illness should gradually improve - sometimes it lasts a few days sometimes 1-2 weeks  Use your inhaler more often when coughing and wheezy  I ordered a higher steroid dose   If you have any suddenly worsening symptoms (nasal congestion/pain, pain with breathing, shortness of breath, new fever) I need you to follow up with me so I can recheck you and see if anything else is needed like a chest X-ray or an antibiotic   Return if symptoms worsen or fail to improve.   [name], PA-C 05/21/22 9:15 AM

## 2022-05-21 NOTE — Patient Instructions (Addendum)
Continue other supportive and symptomatic treatment - push fluids, rest, take tylenol and ibuprofen as directed on bottle/box for fever and pain  Can continue over the counter cold and cough medicines, antihistamines, steroid nasal sprays, decongestants - I would recommend for sure starting a antihistamine daily at bedtime- like zyrtec, claritin, allegra to help reduce your nasal symptoms and nasal discharge.  You can do nasal saline spray and prop the head of your bed up at night to avoid as much nighttime coughing I also recommend you start mucinex daily and push a lot of fluids Try over the counter cough medications like delsym or robitussin if the syrup and perles I sent in do not help you much.   Viral illness should gradually improve - sometimes it lasts a few days sometimes 1-2 weeks  Use your inhaler more often when coughing and wheezy  I ordered a higher steroid dose   If you have any suddenly worsening symptoms (nasal congestion/pain, pain with breathing, shortness of breath, new fever) I need you to follow up with me so I can recheck you and see if anything else is needed like a chest X-ray or an antibiotic

## 2022-05-24 ENCOUNTER — Encounter: Payer: Self-pay | Admitting: Family Medicine

## 2022-05-28 ENCOUNTER — Ambulatory Visit
Admission: RE | Admit: 2022-05-28 | Discharge: 2022-05-28 | Disposition: A | Discharge status: Home / Self Care | Payer: BC Managed Care – PPO | Source: Ambulatory Visit | Attending: Family Medicine | Admitting: Family Medicine

## 2022-05-28 ENCOUNTER — Ambulatory Visit
Admission: RE | Admit: 2022-05-28 | Discharge: 2022-05-28 | Disposition: A | Discharge status: Home / Self Care | Payer: BC Managed Care – PPO | Attending: Family Medicine | Admitting: Family Medicine

## 2022-05-28 DIAGNOSIS — R062 Wheezing: Secondary | ICD-10-CM | POA: Diagnosis not present

## 2022-05-28 DIAGNOSIS — R5383 Other fatigue: Secondary | ICD-10-CM | POA: Diagnosis not present

## 2022-05-28 DIAGNOSIS — R059 Cough, unspecified: Secondary | ICD-10-CM | POA: Diagnosis not present

## 2022-08-12 ENCOUNTER — Ambulatory Visit: Payer: Self-pay

## 2022-08-12 NOTE — Telephone Encounter (Signed)
Third attempt to reach pt. Left message. 

## 2022-08-12 NOTE — Telephone Encounter (Signed)
Patient called, left VM to return the call to the office to discuss symptoms with a nurse.  Summary: yeast infection want med/denies appt   Pt wants fu call from nurse, she wanting a prescription for a yeast infection, denies appt, PCP is out today, pt feels that another PCP should be able to call her something in as she knows what her problem is and does not want any creams. Has taken something in the past but not sure what. FU at [phone]

## 2022-08-12 NOTE — Telephone Encounter (Signed)
Called pt no answer left detailed vm per DPR. Pt needs to have an appointment, her PCP is not in the office today but it is also not guaranteed she will give her something without seeing her. She needs to schedule an appointment.

## 2022-08-12 NOTE — Telephone Encounter (Signed)
2nd attempt, pt called, LVMTCB to discuss with a nurse.

## 2022-09-04 ENCOUNTER — Encounter: Payer: Self-pay | Admitting: Obstetrics and Gynecology

## 2022-09-04 ENCOUNTER — Other Ambulatory Visit (HOSPITAL_COMMUNITY)
Admission: RE | Admit: 2022-09-04 | Discharge: 2022-09-04 | Disposition: A | Discharge status: Home / Self Care | Payer: BC Managed Care – PPO | Source: Ambulatory Visit | Attending: Obstetrics and Gynecology | Admitting: Obstetrics and Gynecology

## 2022-09-04 ENCOUNTER — Ambulatory Visit (INDEPENDENT_AMBULATORY_CARE_PROVIDER_SITE_OTHER): Payer: BC Managed Care – PPO | Admitting: Obstetrics and Gynecology

## 2022-09-04 VITALS — BP 141/91 | HR 68 | Ht 66.0 in | Wt 266.8 lb

## 2022-09-04 DIAGNOSIS — N898 Other specified noninflammatory disorders of vagina: Secondary | ICD-10-CM | POA: Diagnosis not present

## 2022-09-04 DIAGNOSIS — B3731 Acute candidiasis of vulva and vagina: Secondary | ICD-10-CM | POA: Diagnosis not present

## 2022-09-04 DIAGNOSIS — N760 Acute vaginitis: Secondary | ICD-10-CM | POA: Diagnosis not present

## 2022-09-04 DIAGNOSIS — R399 Unspecified symptoms and signs involving the genitourinary system: Secondary | ICD-10-CM

## 2022-09-04 LAB — POCT URINALYSIS DIPSTICK
Bilirubin, UA: NEGATIVE
Blood, UA: NEGATIVE
Glucose, UA: NEGATIVE
Ketones, UA: NEGATIVE
Leukocytes, UA: NEGATIVE
Nitrite, UA: NEGATIVE
Protein, UA: NEGATIVE
Spec Grav, UA: <=1.005 — AB (ref 1.010–1.025)
Urobilinogen, UA: 0.2 E.U./dL
pH, UA: 6 (ref 5.0–8.0)

## 2022-09-04 NOTE — Progress Notes (Signed)
    GYNECOLOGY PROGRESS NOTE  Subjective:    Patient ID: Lori Oliver, female    DOB: 1971-07-22, 51 y.o.   MRN: [id number]  HPI  Patient is a 51 y.o. G1P1 female who presents for vaginitis.Patients states white/watery discharge with itching and pain with intercourse as well as irritation when voiding. Symptoms have been ongoing for ~ 1 month. Attempted to use generic OTC Monistat which helped symptoms for about 1 week but then they returned. Denies concerns for STD exposure.    The following portions of the patient's history were reviewed and updated as appropriate: allergies, current medications, past family history, past medical history, past social history, past surgical history, and problem list.  Review of Systems Pertinent items are noted in HPI.   Objective:   Blood pressure (!) 141/91, pulse 68, height '5\' 6"'$  (1.676 m), weight 266 lb 12.8 oz (121 kg), last menstrual period 04/15/2020.  Body mass index is 43.06 kg/m. General appearance: alert, cooperative, and no distress Abdomen: soft, non-tender; bowel sounds normal; no masses,  no organomegaly Pelvic: external genitalia normal, rectovaginal septum normal.  Vagina with scant thin white discharge.  Cervix normal appearing, no lesions and no motion tenderness.  Bimanual exam not performed.   Adnexae non-palpable, nontender bilaterally.     Labs:  Results for orders placed or performed in visit on 09/04/22  POCT Urinalysis Dipstick  Result Value Ref Range   Color, UA     Clarity, UA     Glucose, UA Negative Negative   Bilirubin, UA Negative    Ketones, UA Negative    Spec Grav, UA <=1.005 (A) 1.010 - 1.025   Blood, UA Negative    pH, UA 6.0 5.0 - 8.0   Protein, UA Negative Negative   Urobilinogen, UA 0.2 0.2 or 1.0 E.U./dL   Nitrite, UA Negative    Leukocytes, UA Negative Negative   Appearance     Odor       Assessment:   1. Acute vaginitis   2. UTI symptoms      Plan:   1. Acute vaginitis - Cervicovaginal  ancillary only.  - Discussed OTC remedies until results return.   2. UTI symptoms - POCT Urinalysis Dipstick negative today   Rubie Maid, MD [facility]

## 2022-09-06 ENCOUNTER — Other Ambulatory Visit: Payer: Self-pay | Admitting: Obstetrics and Gynecology

## 2022-09-06 DIAGNOSIS — B3731 Acute candidiasis of vulva and vagina: Secondary | ICD-10-CM

## 2022-09-06 LAB — CERVICOVAGINAL ANCILLARY ONLY
Bacterial Vaginitis (gardnerella): NEGATIVE
Candida Glabrata: NEGATIVE
Candida Vaginitis: POSITIVE — AB
Comment: NEGATIVE
Comment: NEGATIVE
Comment: NEGATIVE
Comment: NEGATIVE
Trichomonas: NEGATIVE

## 2022-09-06 MED ORDER — FLUCONAZOLE 150 MG PO TABS: 150.0000 mg | ORAL_TABLET | Freq: Once | ORAL | 3 refills | Status: AC

## 2022-12-03 NOTE — Patient Instructions (Signed)
Preventive Care 50-50 Years Old, Female Preventive care refers to lifestyle choices and visits with your health care provider that can promote health and wellness. Preventive care visits are also called wellness exams. What can I expect for my preventive care visit? Counseling Your health care provider may ask you questions about your: Medical history, including: Past medical problems. Family medical history. Pregnancy history. Current health, including: Menstrual cycle. Method of birth control. Emotional well-being. Home life and relationship well-being. Sexual activity and sexual health. Lifestyle, including: Alcohol, nicotine or tobacco, and drug use. Access to firearms. Diet, exercise, and sleep habits. Work and work environment. Sunscreen use. Safety issues such as seatbelt and bike helmet use. Physical exam Your health care provider will check your: Height and weight. These may be used to calculate your BMI (body mass index). BMI is a measurement that tells if you are at a healthy weight. Waist circumference. This measures the distance around your waistline. This measurement also tells if you are at a healthy weight and may help predict your risk of certain diseases, such as type 2 diabetes and high blood pressure. Heart rate and blood pressure. Body temperature. Skin for abnormal spots. What immunizations do I need?  Vaccines are usually given at various ages, according to a schedule. Your health care provider will recommend vaccines for you based on your age, medical history, and lifestyle or other factors, such as travel or where you work. What tests do I need? Screening Your health care provider may recommend screening tests for certain conditions. This may include: Lipid and cholesterol levels. Diabetes screening. This is done by checking your blood sugar (glucose) after you have not eaten for a while (fasting). Pelvic exam and Pap test. Hepatitis B test. Hepatitis C  test. HIV (human immunodeficiency virus) test. STI (sexually transmitted infection) testing, if you are at risk. Lung cancer screening. Colorectal cancer screening. Mammogram. Talk with your health care provider about when you should start having regular mammograms. This may depend on whether you have a family history of breast cancer. BRCA-related cancer screening. This may be done if you have a family history of breast, ovarian, tubal, or peritoneal cancers. Bone density scan. This is done to screen for osteoporosis. Talk with your health care provider about your test results, treatment options, and if necessary, the need for more tests. Follow these instructions at home: Eating and drinking  Eat a diet that includes fresh fruits and vegetables, whole grains, lean protein, and low-fat dairy products. Take vitamin and mineral supplements as recommended by your health care provider. Do not drink alcohol if: Your health care provider tells you not to drink. You are pregnant, may be pregnant, or are planning to become pregnant. If you drink alcohol: Limit how much you have to 0-1 drink a day. Know how much alcohol is in your drink. In the [place] one drink equals one 12 oz bottle of beer (355 mL), one 5 oz glass of wine (148 mL), or one 1 oz glass of hard liquor (44 mL). Lifestyle Brush your teeth every morning and night with fluoride toothpaste. Floss one time each day. Exercise for at least 30 minutes 5 or more days each week. Do not use any products that contain nicotine or tobacco. These products include cigarettes, chewing tobacco, and vaping devices, such as e-cigarettes. If you need help quitting, ask your health care provider. Do not use drugs. If you are sexually active, practice safe sex. Use a condom or other form of protection to   prevent STIs. If you do not wish to become pregnant, use a form of birth control. If you plan to become pregnant, see your health care provider for a  prepregnancy visit. Take aspirin only as told by your health care provider. Make sure that you understand how much to take and what form to take. Work with your health care provider to find out whether it is safe and beneficial for you to take aspirin daily. Find healthy ways to manage stress, such as: Meditation, yoga, or listening to music. Journaling. Talking to a trusted person. Spending time with friends and family. Minimize exposure to UV radiation to reduce your risk of skin cancer. Safety Always wear your seat belt while driving or riding in a vehicle. Do not drive: If you have been drinking alcohol. Do not ride with someone who has been drinking. When you are tired or distracted. While texting. If you have been using any mind-altering substances or drugs. Wear a helmet and other protective equipment during sports activities. If you have firearms in your house, make sure you follow all gun safety procedures. Seek help if you have been physically or sexually abused. What's next? Visit your health care provider once a year for an annual wellness visit. Ask your health care provider how often you should have your eyes and teeth checked. Stay up to date on all vaccines. This information is not intended to replace advice given to you by your health care provider. Make sure you discuss any questions you have with your health care provider. Document Revised: 05/30/2021 Document Reviewed: 05/30/2021 [employer] Patient Education  [place].

## 2022-12-04 ENCOUNTER — Ambulatory Visit (INDEPENDENT_AMBULATORY_CARE_PROVIDER_SITE_OTHER): Payer: BC Managed Care – PPO | Admitting: Family Medicine

## 2022-12-04 ENCOUNTER — Encounter: Payer: Self-pay | Admitting: Family Medicine

## 2022-12-04 VITALS — BP 138/84 | HR 88 | Temp 97.4°F | Resp 16 | Ht 66.0 in | Wt 263.5 lb

## 2022-12-04 DIAGNOSIS — Z0000 Encounter for general adult medical examination without abnormal findings: Secondary | ICD-10-CM

## 2022-12-04 DIAGNOSIS — Z1231 Encounter for screening mammogram for malignant neoplasm of breast: Secondary | ICD-10-CM

## 2022-12-04 DIAGNOSIS — Z1211 Encounter for screening for malignant neoplasm of colon: Secondary | ICD-10-CM | POA: Diagnosis not present

## 2022-12-04 DIAGNOSIS — Z23 Encounter for immunization: Secondary | ICD-10-CM

## 2022-12-04 DIAGNOSIS — N62 Hypertrophy of breast: Secondary | ICD-10-CM

## 2022-12-04 DIAGNOSIS — Z124 Encounter for screening for malignant neoplasm of cervix: Secondary | ICD-10-CM

## 2022-12-04 DIAGNOSIS — Z6841 Body Mass Index (BMI) 40.0 and over, adult: Secondary | ICD-10-CM

## 2022-12-04 DIAGNOSIS — E785 Hyperlipidemia, unspecified: Secondary | ICD-10-CM | POA: Diagnosis not present

## 2022-12-04 DIAGNOSIS — J329 Chronic sinusitis, unspecified: Secondary | ICD-10-CM

## 2022-12-04 DIAGNOSIS — R062 Wheezing: Secondary | ICD-10-CM

## 2022-12-04 DIAGNOSIS — Z7689 Persons encountering health services in other specified circumstances: Secondary | ICD-10-CM

## 2022-12-04 MED ORDER — SEMAGLUTIDE-WEIGHT MANAGEMENT 2.4 MG/0.75ML ~~LOC~~ SOAJ: 2.4000 mg | SUBCUTANEOUS | 0 refills | Status: AC

## 2022-12-04 MED ORDER — LEVOCETIRIZINE DIHYDROCHLORIDE 5 MG PO TABS: 5.0000 mg | ORAL_TABLET | Freq: Every evening | ORAL | 5 refills | Status: DC

## 2022-12-04 MED ORDER — PREDNISONE 20 MG PO TABS: ORAL_TABLET | ORAL | 0 refills | Status: DC

## 2022-12-04 MED ORDER — SEMAGLUTIDE-WEIGHT MANAGEMENT 0.5 MG/0.5ML ~~LOC~~ SOAJ: 0.5000 mg | SUBCUTANEOUS | 0 refills | Status: AC

## 2022-12-04 MED ORDER — SEMAGLUTIDE-WEIGHT MANAGEMENT 0.25 MG/0.5ML ~~LOC~~ SOAJ: 0.2500 mg | SUBCUTANEOUS | 0 refills | Status: AC

## 2022-12-04 MED ORDER — SEMAGLUTIDE-WEIGHT MANAGEMENT 1 MG/0.5ML ~~LOC~~ SOAJ: 1.0000 mg | SUBCUTANEOUS | 0 refills | Status: DC

## 2022-12-04 MED ORDER — SEMAGLUTIDE-WEIGHT MANAGEMENT 1.7 MG/0.75ML ~~LOC~~ SOAJ: 1.7000 mg | SUBCUTANEOUS | 0 refills | Status: DC

## 2022-12-04 NOTE — Progress Notes (Signed)
Patient: Lori Oliver, Female    DOB: June 24, 1971, 51 y.o.   MRN: [id number] [name], PA-C Visit Date: 12/04/2022  Today's Provider: [name], PA-C   Chief Complaint  Patient presents with   Annual Exam   Subjective:   Annual physical exam:  Lori Oliver is a 51 y.o. female who presents today for complete physical exam:  Exercise/Activity:  5 d a week 20 min daily Diet/nutrition:  trying to cut back on sweet and sodas and stuff Sleep: sleeping well  Working 2 jobs - very tired today  Wheezing usually occurs when its cold, doesn't feel SOB Wants f/up on prior referrals - bariatric and breast reduction Discuss weight management meds as well    [place]: No Food Insecurity (12/04/2022)  Housing: Low Risk  (12/04/2022)  Transportation Needs: No Transportation Needs (12/04/2022)  Utilities: Not At Risk (12/04/2022)  Alcohol Screen: Low Risk  (12/04/2022)  Depression (PHQ2-9): Low Risk  (12/04/2022)  Financial Resource Strain: Low Risk  (12/04/2022)  Physical Activity: Insufficiently Active (12/04/2022)  Social Connections: Moderately Integrated (12/04/2022)  Stress: No Stress Concern Present (12/04/2022)  Tobacco Use: Low Risk  (12/04/2022)    USPSTF grade A and B recommendations - reviewed and addressed today  Depression:  Phq 9 completed today by patient, was reviewed by me with patient in the room PHQ score is neg, normal, reviewed today, pt feels good    12/04/2022    3:12 PM 05/21/2022    9:02 AM 03/11/2022    1:51 PM 11/20/2021    3:24 PM  PHQ 2/9 Scores  PHQ - 2 Score 0 0 2 0  PHQ- 9 Score 0 0 2       12/04/2022    3:12 PM 05/21/2022    9:02 AM 03/11/2022    1:51 PM 11/20/2021    3:24 PM 10/15/2021    9:30 AM  Depression screen PHQ 2/9  Decreased Interest 0 0 0 0 0  Down, Depressed, Hopeless 0 0 2 0 0  PHQ - 2 Score 0 0 2 0 0  Altered sleeping 0 0 0    Tired, decreased energy 0 0 0    Change in appetite 0 0 0     Feeling bad or failure about yourself  0 0 0    Trouble concentrating 0 0 0    Moving slowly or fidgety/restless 0 0 0    Suicidal thoughts 0 0 0    PHQ-9 Score 0 0 2    Difficult doing work/chores Not difficult at all Not difficult at all Not difficult at all      Alcohol screening: [place] Office Visit from 12/04/2022 in [facility]  AUDIT-C Score 1       Immunizations and Health Maintenance: Health Maintenance  Topic Date Due   COLONOSCOPY (Pts 45-73yr Insurance coverage will need to be confirmed)  Never done   COVID-19 Vaccine (4 - 2023-24 season) 12/20/2022 (Originally 08/16/2022)   Zoster Vaccines- Shingrix (1 of 2) 03/05/2023 (Originally 12/20/2021)   INFLUENZA VACCINE  03/16/2023 (Originally 07/16/2022)   MAMMOGRAM  05/02/2023   PAP SMEAR-Modifier  12/05/2024   DTaP/Tdap/Td (2 - Td or Tdap) 11/30/2028   Hepatitis C Screening  Completed   HIV Screening  Completed   HPV VACCINES  Aged Out   PAP-Cervical Cytology Screening  Discontinued     Hep C Screening: done, completed  STD testing and prevention (HIV/chl/gon/syphilis):  see above,  no additional testing desired by pt today  Intimate partner violence  denies  Sexual History/Pain during Intercourse: Widowed  Menstrual History/LMP/Abnormal Bleeding:   none postmenopausal  Patient's last menstrual period was 04/15/2020 (within days).  Incontinence Symptoms: some urgency  Breast cancer: due, previously ordered Last Mammogram: *see HM list above BRCA gene screening:   Cervical cancer screening: UTD Pt family hx of cancers - breast, ovarian, uterine, colon:     Osteoporosis:   Discussion on osteoporosis per age, including high calcium and vitamin D supplementation, weight bearing exercises  Skin cancer:  Hx of skin CA -  NO Discussed atypical lesions  Right forearm small lesion, getting bigger different color in the middle Left lower leg lesion solid mildly raised dry   Colorectal  cancer:   Colonoscopy is due Discussed concerning signs and sx of CRC, pt denies   Lung cancer:   Low Dose CT Chest recommended if Age 26-80 years, 20 pack-year currently smoking OR have quit w/in 15years. Patient does not qualify.    Social History   Tobacco Use   Smoking status: Never   Smokeless tobacco: Never  Vaping Use   Vaping Use: Never used  Substance Use Topics   Alcohol use: Not Currently    Alcohol/week: 0.0 standard drinks of alcohol    Comment: occ   Drug use: No     Flowsheet Row Office Visit from 12/04/2022 in [facility]  AUDIT-C Score 1       Family History  Problem Relation Age of Onset   Hypertension Mother    Heart disease Mother    Arthritis Mother    Depression Mother    Stroke Maternal Grandmother      Blood pressure/Hypertension: BP Readings from Last 3 Encounters:  12/04/22 138/84  09/04/22 (!) 141/91  05/21/22 132/82    Weight/Obesity: Wt Readings from Last 3 Encounters:  12/04/22 263 lb 8 oz (119.5 kg)  09/04/22 266 lb 12.8 oz (121 kg)  05/21/22 263 lb 9.6 oz (119.6 kg)   BMI Readings from Last 3 Encounters:  12/04/22 42.53 kg/m  09/04/22 43.06 kg/m  05/21/22 42.55 kg/m     Lipids:  Lab Results  Component Value Date   CHOL 148 03/19/2021   CHOL 164 12/06/2019   CHOL 162 12/14/2018   Lab Results  Component Value Date   HDL 45 (L) 03/19/2021   HDL 47 (L) 12/06/2019   HDL 46 (L) 12/14/2018   Lab Results  Component Value Date   LDLCALC 88 03/19/2021   LDLCALC 97 12/06/2019   LDLCALC 100 (H) 12/14/2018   Lab Results  Component Value Date   TRIG 66 03/19/2021   TRIG 108 12/06/2019   TRIG 69 12/14/2018   Lab Results  Component Value Date   CHOLHDL 3.3 03/19/2021   CHOLHDL 3.5 12/06/2019   CHOLHDL 3.5 12/14/2018   No results found for: "LDLDIRECT" Based on the results of lipid panel his/her cardiovascular risk factor ( using [place] )  in the next 10 years is: The 10-year ASCVD risk  score (Arnett DK, et al., 2019) is: 4.7%   Values used to calculate the score:     Age: 8 years     Sex: Female     Is Non-Hispanic African American: Yes     Diabetic: No     Tobacco smoker: No     Systolic Blood Pressure: 166 mmHg     Is BP treated: Yes     HDL Cholesterol:  45 mg/dL     Total Cholesterol: 148 mg/dL  Glucose:  Glucose  Date Value Ref Range Status  03/09/2014 86 65 - 99 mg/dL Final   Glucose, Bld  Date Value Ref Range Status  03/19/2021 85 65 - 99 mg/dL Final    Comment:    .            Fasting reference interval .   12/06/2019 65 65 - 99 mg/dL Final    Comment:    .            Fasting reference interval .   10/03/2019 107 (H) 70 - 99 mg/dL Final   Glucose-Capillary  Date Value Ref Range Status  03/14/2019 116 (H) 70 - 99 mg/dL Final  12/01/2017 126 (H) 65 - 99 mg/dL Final    Advanced Care Planning:  A voluntary discussion about advance care planning including the explanation and discussion of advance directives.     Social History       Social History   Socioeconomic History   Marital status: Widowed    Spouse name: Coralyn Mark   Number of children: 1   Years of education: 12   Highest education level: Some college, no degree  Occupational History   Not on file  Tobacco Use   Smoking status: Never   Smokeless tobacco: Never  Vaping Use   Vaping Use: Never used  Substance and Sexual Activity   Alcohol use: Not Currently    Alcohol/week: 0.0 standard drinks of alcohol    Comment: occ   Drug use: No   Sexual activity: Yes    Partners: Male    Birth control/protection: Abstinence  Other Topics Concern   Not on file  Social History Narrative   Husband was diagnosed with Cancer this past Summer.   Social Determinants of Health   Financial Resource Strain: Low Risk  (12/04/2022)   Overall Financial Resource Strain (CARDIA)    Difficulty of Paying Living Expenses: Not hard at all  Food Insecurity: No Food Insecurity (12/04/2022)    Hunger Vital Sign    Worried About Running Out of Food in the Last Year: Never true    Ran Out of Food in the Last Year: Never true  Transportation Needs: No Transportation Needs (12/04/2022)   PRAPARE - Hydrologist (Medical): No    Lack of Transportation (Non-Medical): No  Physical Activity: Insufficiently Active (12/04/2022)   Exercise Vital Sign    Days of Exercise per Week: 5 days    Minutes of Exercise per Session: 20 min  Stress: No Stress Concern Present (12/04/2022)   [place]    Feeling of Stress : Only a little  Social Connections: Moderately Integrated (12/04/2022)   Social Connection and Isolation Panel [NHANES]    Frequency of Communication with Friends and Family: More than three times a week    Frequency of Social Gatherings with Friends and Family: More than three times a week    Attends Religious Services: 1 to 4 times per year    Active Member of [employer] or Organizations: Yes    Attends Archivist Meetings: 1 to 4 times per year    Marital Status: Widowed    Family History        Family History  Problem Relation Age of Onset   Hypertension Mother    Heart disease Mother    Arthritis Mother    Depression Mother  Stroke Maternal Grandmother     Patient Active Problem List   Diagnosis Date Noted   Upper airway cough syndrome 02/21/2022   Shortness of breath 10/22/2021   Congestive heart failure, unspecified HF chronicity, unspecified heart failure type ([place]) 03/19/2021   Nexplanon in place 01/03/2021   Prediabetes 07/17/2020   Hyperlipidemia 07/17/2020   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   Gastroesophageal reflux disease without esophagitis 07/17/2020   Uterine leiomyoma 07/17/2020   Atrial tachycardia 05/17/2019   Chest tightness 05/17/2019   Intermittent atrial fibrillation ([place]) 02/19/2018   Hypokalemia 02/10/2018   Anxiety 01/13/2018   Atrial  fibrillation with RVR ([place]) 12/01/2017   Vitamin D deficiency 07/08/2017   Lower back pain 07/07/2017   Class 3 severe obesity due to excess calories with serious comorbidity and body mass index (BMI) of 40.0 to 44.9 in adult ([place]) 07/07/2017   Essential hypertension 07/11/2016   IFG (impaired fasting glucose) 05/16/2016   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 05/16/2016   Abnormal thyroid function test 05/16/2016   Decreased libido 05/16/2016   Breast lump on left side at 5 o'clock position 05/16/2016   Bilateral thoracic back pain 06/01/2015   History of abnormal cervical Pap smear 06/01/2015    Past Surgical History:  Procedure Laterality Date   BREAST BIOPSY Right 2015   benign   ESOPHAGOGASTRODUODENOSCOPY (EGD) WITH PROPOFOL N/A 08/12/2018   Procedure: ESOPHAGOGASTRODUODENOSCOPY (EGD) WITH PROPOFOL;  Surgeon: Tol[place], Benay Pike, MD;  Location: [facility] ENDOSCOPY;  Service: Gastroenterology;  Laterality: N/A;   HAMMER TOE SURGERY       Current Outpatient Medications:    diltiazem (CARDIZEM) 30 MG tablet, Take 1 tablet (30 mg total) by mouth 3 (three) times daily as needed., Disp: 60 tablet, Rfl: 1   propranolol (INDERAL) 20 MG tablet, Take 1 tablet (20 mg total) by mouth 3 (three) times daily as needed., Disp: 60 tablet, Rfl: 1  No Known Allergies  Patient Care Team: [name], PA-C as PCP - General (Family Medicine) [name] Kathlene November, MD as Consulting Physician (Cardiology) Rubie Maid, MD as Referring Physician (Obstetrics and Gynecology) Sharlotte Alamo, DPM as Consulting Physician (Podiatry)   Chart Review: I personally reviewed active problem list, medication list, allergies, family history, social history, health maintenance, notes from last encounter, lab results, imaging with the patient/caregiver today.   Review of Systems  Constitutional: Negative.   HENT: Negative.    Eyes: Negative.   Respiratory: Negative.    Cardiovascular: Negative.   Gastrointestinal: Negative.    Endocrine: Negative.   Genitourinary: Negative.   Musculoskeletal: Negative.   Skin: Negative.   Allergic/Immunologic: Negative.   Neurological: Negative.   Hematological: Negative.   Psychiatric/Behavioral: Negative.    All other systems reviewed and are negative.         Objective:   Vitals:  Vitals:   12/04/22 1514  BP: 138/84  Pulse: 88  Resp: 16  Temp: (!) 97.4 F (36.3 C)  TempSrc: Oral  SpO2: 100%  Weight: 263 lb 8 oz (119.5 kg)  Height: _0  (1.676 m)    Body mass index is 42.53 kg/m.  Physical Exam Vitals and nursing note reviewed.  Constitutional:      General: She is not in acute distress.    Appearance: Normal appearance. She is well-developed. She is obese. She is not ill-appearing, toxic-appearing or diaphoretic.  HENT:     Head: Normocephalic and atraumatic.     Right Ear: Tympanic membrane, ear canal and external ear normal. There is no impacted cerumen.  Left Ear: Tympanic membrane, ear canal and external ear normal. There is no impacted cerumen.     Nose: Nose normal.     Mouth/Throat:     Mouth: Mucous membranes are moist.     Pharynx: Oropharynx is clear. Uvula midline. No oropharyngeal exudate.  Eyes:     General: Lids are normal. No scleral icterus.       Right eye: No discharge.        Left eye: No discharge.     Conjunctiva/sclera: Conjunctivae normal.  Neck:     Trachea: Trachea and phonation normal. No tracheal deviation.  Cardiovascular:     Rate and Rhythm: Normal rate and regular rhythm.     Pulses: Normal pulses.          Radial pulses are 2+ on the right side and 2+ on the left side.       Posterior tibial pulses are 2+ on the right side and 2+ on the left side.     Heart sounds: Normal heart sounds. No murmur heard.    No friction rub. No gallop.  Pulmonary:     Effort: Pulmonary effort is normal. No respiratory distress.     Breath sounds: No stridor. Wheezing present. No rhonchi or rales.  Chest:     Chest wall: No  tenderness.  Abdominal:     General: Bowel sounds are normal. There is no distension.     Palpations: Abdomen is soft.     Tenderness: There is no abdominal tenderness. There is no guarding or rebound.  Musculoskeletal:        General: No deformity. Normal range of motion.     Cervical back: Normal range of motion and neck supple.  Lymphadenopathy:     Cervical: No cervical adenopathy.  Skin:    General: Skin is warm and dry.     Capillary Refill: Capillary refill takes less than 2 seconds.     Coloration: Skin is not pale.     Findings: No rash.  Neurological:     Mental Status: She is alert and oriented to person, place, and time.     Motor: No abnormal muscle tone.     Gait: Gait normal.  Psychiatric:        Speech: Speech normal.        Behavior: Behavior normal.       Fall Risk:    12/04/2022    3:12 PM 05/21/2022    9:01 AM 03/11/2022    1:51 PM 11/20/2021    3:23 PM 10/22/2021    2:51 PM  [place] in the past year? 0 0 0 0 0  Number falls in past yr: 0 0 0 0 0  Injury with Fall? 0 0 0 0 0  Risk for fall due to : No Fall Risks No Fall Risks No Fall Risks    Follow up Falls prevention discussed;Education provided;Falls evaluation completed Falls prevention discussed;Education provided Falls prevention discussed Falls evaluation completed     Functional Status Survey: Is the patient deaf or have difficulty hearing?: No Does the patient have difficulty seeing, even when wearing glasses/contacts?: No Does the patient have difficulty concentrating, remembering, or making decisions?: No Does the patient have difficulty walking or climbing stairs?: No Does the patient have difficulty dressing or bathing?: No Does the patient have difficulty doing errands alone such as visiting a doctor's office or shopping?: No   Assessment & Plan:    CPE completed today  USPSTF grade A and B recommendations reviewed with patient; age-appropriate recommendations, preventive  care, screening tests, etc discussed and encouraged; healthy living encouraged; see AVS for patient education given to patient  Discussed importance of 150 minutes of physical activity weekly, AHA exercise recommendations given to pt in AVS/handout  Discussed importance of healthy diet:  eating lean meats and proteins, avoiding trans fats and saturated fats, avoid simple sugars and excessive carbs in diet, eat 6 servings of fruit/vegetables daily and drink plenty of water and avoid sweet beverages.    Recommended pt to do annual eye exam and routine dental exams/cleanings  Depression, alcohol, fall screening completed as documented above and per flowsheets  Advance Care planning information and packet discussed and offered today, encouraged pt to discuss with family members/spouse/partner/friends and complete Advanced directive packet and bring copy to office   Reviewed Health Maintenance: Health Maintenance  Topic Date Due   COLONOSCOPY (Pts 45-37yr Insurance coverage will need to be confirmed)  Never done   COVID-19 Vaccine (4 - 2023-24 season) 12/20/2022 (Originally 08/16/2022)   Zoster Vaccines- Shingrix (1 of 2) 03/05/2023 (Originally 12/20/2021)   INFLUENZA VACCINE  03/16/2023 (Originally 07/16/2022)   MAMMOGRAM  05/02/2023   PAP SMEAR-Modifier  12/05/2024   DTaP/Tdap/Td (2 - Td or Tdap) 11/30/2028   Hepatitis C Screening  Completed   HIV Screening  Completed   HPV VACCINES  Aged Out   PAP-Cervical Cytology Screening  Discontinued    Immunizations: Immunization History  Administered Date(s) Administered   Influenza-Unspecified 09/22/2019, 10/05/2021   PFIZER(Purple Top)SARS-COV-2 Vaccination 01/14/2020, 02/04/2020, 10/30/2020   Tdap 11/30/2018   Vaccines:  HPV: up to at age 76103, ask insurance if age between 232-45 Shingrix: 59-64yo and ask insurance if covered when patient above 664yo Pneumonia:  educated and discussed with patient. Flu:  educated and discussed with  patient. COVID:      ICD-10-CM   1. Annual physical exam  ZX50.56COMPLETE METABOLIC PANEL WITH GFR    CBC with Differential/Platelet    Lipid panel    2. Need for influenza vaccination  Z23    refused today    3. Screening for colon cancer  Z12.11 Ambulatory referral to Gastroenterology    4. Encounter for screening mammogram for malignant neoplasm of breast  Z12.31 MM 3D SCREEN BREAST BILATERAL    5. Class 3 severe obesity due to excess calories with serious comorbidity and body mass index (BMI) of 40.0 to 44.9 in adult ([facility]  E66.01    Z68.41    see below - prior referrals not completed, shes interested in meds    6. Wheeze  R06.2 levocetirizine (XYZAL) 5 MG tablet    predniSONE (DELTASONE) 20 MG tablet   on expiration in all lung fields, denies SOB, doesn't want inhaler after offered and explained how it can help, steroids offered    7. Rhinosinusitis  J32.9 levocetirizine (XYZAL) 5 MG tablet    8. Morbid obesity (HCC)  E66.01 Semaglutide-Weight Management 0.25 MG/0.5ML SOAJ    Semaglutide-Weight Management 0.5 MG/0.5ML SOAJ    Semaglutide-Weight Management 1 MG/0.5ML SOAJ    Semaglutide-Weight Management 1.7 MG/0.75ML SOAJ    Semaglutide-Weight Management 2.4 MG/0.75ML SOAJ    9. 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  N62    pt want f/up on prior referrals - explained she can consult with plastic surgeon about breasts and back pain    10. Encounter for weight management  Z76.89 Semaglutide-Weight Management 0.25 MG/0.5ML SOAJ    Semaglutide-Weight Management  0.5 MG/0.5ML SOAJ    Semaglutide-Weight Management 1 MG/0.5ML SOAJ    Semaglutide-Weight Management 1.7 MG/0.75ML SOAJ    Semaglutide-Weight Management 2.4 MG/0.75ML SOAJ   no contraindication to GLP-1, reviewed med, dosing adjunct tx/lifestyle measures          [name], PA-C 12/04/22 3:18 PM  [facility]

## 2022-12-05 ENCOUNTER — Telehealth: Payer: Self-pay

## 2022-12-05 LAB — COMPLETE METABOLIC PANEL WITH GFR
AG Ratio: 1.3 (calc) (ref 1.0–2.5)
ALT: 14 U/L (ref 6–29)
AST: 15 U/L (ref 10–35)
Albumin: 4 g/dL (ref 3.6–5.1)
Alkaline phosphatase (APISO): 81 U/L (ref 37–153)
BUN/Creatinine Ratio: 16 (calc) (ref 6–22)
BUN: 17 mg/dL (ref 7–25)
CO2: 29 mmol/L (ref 20–32)
Calcium: 9.2 mg/dL (ref 8.6–10.4)
Chloride: 105 mmol/L (ref 98–110)
Creat: 1.06 mg/dL — ABNORMAL HIGH (ref 0.50–1.03)
Globulin: 3.1 g/dL (calc) (ref 1.9–3.7)
Glucose, Bld: 88 mg/dL (ref 65–99)
Potassium: 3.9 mmol/L (ref 3.5–5.3)
Sodium: 141 mmol/L (ref 135–146)
Total Bilirubin: 0.3 mg/dL (ref 0.2–1.2)
Total Protein: 7.1 g/dL (ref 6.1–8.1)
eGFR: 64 mL/min/{1.73_m2} (ref >=60)

## 2022-12-05 LAB — CBC WITH DIFFERENTIAL/PLATELET
Absolute Monocytes: 647 cells/uL (ref 200–950)
Basophils Absolute: 42 cells/uL (ref 0–200)
Basophils Relative: 0.5 %
Eosinophils Absolute: 340 cells/uL (ref 15–500)
Eosinophils Relative: 4.1 %
HCT: 36.5 % (ref 35.0–45.0)
Hemoglobin: 12.3 g/dL (ref 11.7–15.5)
Lymphs Abs: 3743 cells/uL (ref 850–3900)
MCH: 31.9 pg (ref 27.0–33.0)
MCHC: 33.7 g/dL (ref 32.0–36.0)
MCV: 94.8 fL (ref 80.0–100.0)
MPV: 11.6 fL (ref 7.5–12.5)
Monocytes Relative: 7.8 %
Neutro Abs: 3528 cells/uL (ref 1500–7800)
Neutrophils Relative %: 42.5 %
Platelets: 259 10*3/uL (ref 140–400)
RBC: 3.85 10*6/uL (ref 3.80–5.10)
RDW: 12.2 % (ref 11.0–15.0)
Total Lymphocyte: 45.1 %
WBC: 8.3 10*3/uL (ref 3.8–10.8)

## 2022-12-05 LAB — LIPID PANEL
Cholesterol: 173 mg/dL (ref <200)
HDL: 54 mg/dL (ref >=50)
LDL Cholesterol (Calc): 100 mg/dL (calc) — ABNORMAL HIGH
Non-HDL Cholesterol (Calc): 119 mg/dL (calc) (ref <130)
Total CHOL/HDL Ratio: 3.2 (calc) (ref <5.0)
Triglycerides: 92 mg/dL (ref <150)

## 2022-12-05 NOTE — Telephone Encounter (Signed)
No answer from pt left vm to return my call. Pt insurance will not cover wegovy so I left a detailed vm stating to her, to call insurance and ask what alternatives do they cover for weight loss or GLP-1.

## 2022-12-06 ENCOUNTER — Telehealth: Payer: Self-pay | Admitting: *Deleted

## 2022-12-06 ENCOUNTER — Other Ambulatory Visit: Payer: Self-pay | Admitting: *Deleted

## 2022-12-06 DIAGNOSIS — Z1211 Encounter for screening for malignant neoplasm of colon: Secondary | ICD-10-CM

## 2022-12-06 NOTE — Telephone Encounter (Signed)
Gastroenterology Pre-Procedure Review  Request Date: 02/05/2023 Requesting Physician: Dr. Marius Ditch  PATIENT REVIEW QUESTIONS: The patient responded to the following health history questions as indicated:    1. Are you having any GI issues? no 2. Do you have a personal history of Polyps? no 3. Do you have a family history of Colon Cancer or Polyps? no 4. Diabetes Mellitus? no 5. Joint replacements in the past 12 months?no 6. Major health problems in the past 3 months?no 7. Any artificial heart valves, MVP, or defibrillator?no    MEDICATIONS & ALLERGIES:    Patient reports the following regarding taking any anticoagulation/antiplatelet therapy:   Plavix, Coumadin, Eliquis, Xarelto, Lovenox, Pradaxa, Brilinta, or Effient? no Aspirin? no  Patient confirms/reports the following medications:  Current Outpatient Medications  Medication Sig Dispense Refill   diltiazem (CARDIZEM) 30 MG tablet Take 1 tablet (30 mg total) by mouth 3 (three) times daily as needed. 60 tablet 1   levocetirizine (XYZAL) 5 MG tablet Take 1 tablet (5 mg total) by mouth every evening. 30 tablet 5   predniSONE (DELTASONE) 20 MG tablet 2 tabs poqday 1-3, 1 tabs poqday 4-6 9 tablet 0   propranolol (INDERAL) 20 MG tablet Take 1 tablet (20 mg total) by mouth 3 (three) times daily as needed. 60 tablet 1   Semaglutide-Weight Management 0.25 MG/0.5ML SOAJ Inject 0.25 mg into the skin once a week for 28 days. 2 mL 0   [START ON 01/02/2023] Semaglutide-Weight Management 0.5 MG/0.5ML SOAJ Inject 0.5 mg into the skin once a week for 28 days. 2 mL 0   [START ON 01/31/2023] Semaglutide-Weight Management 1 MG/0.5ML SOAJ Inject 1 mg into the skin once a week for 28 days. 2 mL 0   [START ON 03/01/2023] Semaglutide-Weight Management 1.7 MG/0.75ML SOAJ Inject 1.7 mg into the skin once a week for 28 days. 3 mL 0   [START ON 03/30/2023] Semaglutide-Weight Management 2.4 MG/0.75ML SOAJ Inject 2.4 mg into the skin once a week for 28 days. 3 mL 0    No current facility-administered medications for this visit.    Patient confirms/reports the following allergies:  No Known Allergies  No orders of the defined types were placed in this encounter.   AUTHORIZATION INFORMATION Primary Insurance: 1D#: Group #:  Secondary Insurance: 1D#: Group #:  SCHEDULE INFORMATION: Date: 02/05/2023 Time: Location:  [place]

## 2022-12-06 NOTE — Telephone Encounter (Signed)
Noted.   Patient will need to stop Semaglutide for 7 days before the procedure.  Patient still have her prep solution (Suprep) from 2022 since she canceled her colonoscopy 05/21/2021. Patient decline for me sending her a new prescription since it is not expire until later 2024.

## 2022-12-10 ENCOUNTER — Other Ambulatory Visit: Payer: Self-pay | Admitting: Family Medicine

## 2022-12-10 DIAGNOSIS — G8929 Other chronic pain: Secondary | ICD-10-CM

## 2022-12-10 DIAGNOSIS — N62 Hypertrophy of breast: Secondary | ICD-10-CM

## 2022-12-11 ENCOUNTER — Encounter: Payer: Self-pay | Admitting: Family Medicine

## 2023-01-22 ENCOUNTER — Institutional Professional Consult (permissible substitution): Payer: BC Managed Care – PPO | Admitting: Plastic Surgery

## 2023-02-03 ENCOUNTER — Telehealth: Payer: Self-pay

## 2023-02-03 MED ORDER — NA SULFATE-K SULFATE-MG SULF 17.5-3.13-1.6 GM/177ML PO SOLN: 1.0000 | Freq: Once | ORAL | 0 refills | Status: AC

## 2023-02-03 MED ORDER — PEG 3350-KCL-NABCB-NACL-NASULF 236 G PO SOLR: 4000.0000 mL | Freq: Once | ORAL | 0 refills | Status: AC

## 2023-02-03 NOTE — Telephone Encounter (Signed)
Patient left a voicemail on my phone about some question about her upcoming colonoscopy on Wednesday

## 2023-02-03 NOTE — Telephone Encounter (Signed)
I have called patient back. Patient stated that when we schedule her colonoscopy, she told me not to send a prep solution because she have one already.  However, upon talking to patient today, she stated that the prep solution she have is discontinued.  So we over her instructions again. I went ahead and send Surpep and Golytely because patient is not sure if she have enough on her FSA card.  New instructions with directions for both prep solution have been sent.

## 2023-02-04 ENCOUNTER — Telehealth: Payer: Self-pay | Admitting: Family Medicine

## 2023-02-04 NOTE — Telephone Encounter (Signed)
Copied from [place] [phone]. Topic: General - Inquiry >> Feb 04, 2023  9:16 AM Lori Oliver wrote: Patient would like to know if there is an alternative medication for Semaglutide-Weight Management 1 MG/0.5ML SOAJ that is covered by [facility]. Please follow up with patient.

## 2023-02-04 NOTE — Telephone Encounter (Signed)
Called pt back no answer, left vm stating she will have to call her insurance to verify what alternatives they cover for her and let us know once she is informed.

## 2023-02-05 ENCOUNTER — Encounter: Payer: Self-pay | Admitting: Gastroenterology

## 2023-02-05 ENCOUNTER — Encounter
Admission: RE | Disposition: A | Discharge status: Home / Self Care | Payer: Self-pay | Source: Home / Self Care | Attending: Gastroenterology

## 2023-02-05 ENCOUNTER — Ambulatory Visit: Payer: BC Managed Care – PPO | Admitting: Registered Nurse

## 2023-02-05 ENCOUNTER — Ambulatory Visit
Admission: RE | Admit: 2023-02-05 | Discharge: 2023-02-05 | Disposition: A | Discharge status: Home / Self Care | Payer: BC Managed Care – PPO | Attending: Gastroenterology | Admitting: Gastroenterology

## 2023-02-05 DIAGNOSIS — Z1211 Encounter for screening for malignant neoplasm of colon: Secondary | ICD-10-CM | POA: Diagnosis not present

## 2023-02-05 DIAGNOSIS — I4891 Unspecified atrial fibrillation: Secondary | ICD-10-CM | POA: Diagnosis not present

## 2023-02-05 DIAGNOSIS — K5730 Diverticulosis of large intestine without perforation or abscess without bleeding: Secondary | ICD-10-CM | POA: Diagnosis not present

## 2023-02-05 DIAGNOSIS — K219 Gastro-esophageal reflux disease without esophagitis: Secondary | ICD-10-CM | POA: Diagnosis not present

## 2023-02-05 DIAGNOSIS — I509 Heart failure, unspecified: Secondary | ICD-10-CM | POA: Diagnosis not present

## 2023-02-05 DIAGNOSIS — I110 Hypertensive heart disease with heart failure: Secondary | ICD-10-CM | POA: Diagnosis not present

## 2023-02-05 DIAGNOSIS — F419 Anxiety disorder, unspecified: Secondary | ICD-10-CM | POA: Insufficient documentation

## 2023-02-05 DIAGNOSIS — E785 Hyperlipidemia, unspecified: Secondary | ICD-10-CM | POA: Diagnosis not present

## 2023-02-05 DIAGNOSIS — Z6841 Body Mass Index (BMI) 40.0 and over, adult: Secondary | ICD-10-CM | POA: Diagnosis not present

## 2023-02-05 DIAGNOSIS — F32A Depression, unspecified: Secondary | ICD-10-CM | POA: Insufficient documentation

## 2023-02-05 HISTORY — PX: COLONOSCOPY WITH PROPOFOL: SHX5780

## 2023-02-05 SURGERY — COLONOSCOPY WITH PROPOFOL
Anesthesia: General

## 2023-02-05 MED ORDER — PROPOFOL 1000 MG/100ML IV EMUL
INTRAVENOUS | Status: AC
  Filled 2023-02-05: qty 100

## 2023-02-05 MED ORDER — PROPOFOL 500 MG/50ML IV EMUL
INTRAVENOUS | Status: DC | PRN
  Administered 2023-02-05: 150 ug/kg/min via INTRAVENOUS

## 2023-02-05 MED ORDER — LIDOCAINE HCL (CARDIAC) PF 100 MG/5ML IV SOSY
PREFILLED_SYRINGE | INTRAVENOUS | Status: DC | PRN
  Administered 2023-02-05: 40 mg via INTRAVENOUS

## 2023-02-05 MED ORDER — SODIUM CHLORIDE 0.9 % IV SOLN
INTRAVENOUS | Status: DC
  Administered 2023-02-05: 20 mL/h via INTRAVENOUS

## 2023-02-05 MED ORDER — DEXMEDETOMIDINE HCL IN NACL 80 MCG/20ML IV SOLN
INTRAVENOUS | Status: AC
  Filled 2023-02-05: qty 20

## 2023-02-05 MED ORDER — PROPOFOL 10 MG/ML IV BOLUS
INTRAVENOUS | Status: DC | PRN
  Administered 2023-02-05: 100 mg via INTRAVENOUS
  Administered 2023-02-05: 20 mg via INTRAVENOUS

## 2023-02-05 MED ORDER — DEXMEDETOMIDINE HCL IN NACL 200 MCG/50ML IV SOLN
INTRAVENOUS | Status: DC | PRN
  Administered 2023-02-05: 8 ug via INTRAVENOUS

## 2023-02-05 NOTE — Anesthesia Procedure Notes (Signed)
Date/Time: 02/05/2023 8:02 AM  Performed by: Doreen Salvage, CRNAPre-anesthesia Checklist: Patient identified, Emergency Drugs available, Suction available and Patient being monitored Patient Re-evaluated:Patient Re-evaluated prior to induction Oxygen Delivery Method: Nasal cannula Induction Type: IV induction Dental Injury: Teeth and Oropharynx as per pre-operative assessment  Comments: Nasal cannula with etCO2 monitoring

## 2023-02-05 NOTE — Op Note (Signed)
[facility] Gastroenterology Patient Name: Lori Oliver Procedure Date: 02/05/2023 7:05 AM MRN: [id number] Account #: [id number] Date of Birth: 11-17-1971 Admit Type: Outpatient Age: 52 Room: [facility] ENDO ROOM 4 Gender: Female Note Status: Finalized Instrument Name: Park Meo [id number] Procedure:             Colonoscopy Indications:           Screening for colorectal malignant neoplasm, This is                         the patient's first colonoscopy Providers:             Lin Landsman MD, MD Referring MD:          [name] (Referring MD) Medicines:             General Anesthesia Complications:         No immediate complications. Estimated blood loss: None. Procedure:             Pre-Anesthesia Assessment:                        - Prior to the procedure, a History and Physical was                         performed, and patient medications and allergies were                         reviewed. The patient is competent. The risks and                         benefits of the procedure and the sedation options and                         risks were discussed with the patient. All questions                         were answered and informed consent was obtained.                         Patient identification and proposed procedure were                         verified by the physician, the nurse, the                         anesthesiologist, the anesthetist and the technician                         in the pre-procedure area in the procedure room in the                         endoscopy suite. Mental Status Examination: alert and                         oriented. Airway Examination: normal oropharyngeal                         airway and neck mobility. Respiratory Examination:  clear to auscultation. CV Examination: normal.                         Prophylactic Antibiotics: The patient does not require                         prophylactic  antibiotics. Prior Anticoagulants: The                         patient has taken no anticoagulant or antiplatelet                         agents. ASA Grade Assessment: III - A patient with                         severe systemic disease. After reviewing the risks and                         benefits, the patient was deemed in satisfactory                         condition to undergo the procedure. The anesthesia                         plan was to use general anesthesia. Immediately prior                         to administration of medications, the patient was                         re-assessed for adequacy to receive sedatives. The                         heart rate, respiratory rate, oxygen saturations,                         blood pressure, adequacy of pulmonary ventilation, and                         response to care were monitored throughout the                         procedure. The physical status of the patient was                         re-assessed after the procedure.                        After obtaining informed consent, the colonoscope was                         passed under direct vision. Throughout the procedure,                         the patient's blood pressure, pulse, and oxygen                         saturations were monitored continuously. The  Colonoscope was introduced through the anus and                         advanced to the the cecum, identified by appendiceal                         orifice and ileocecal valve. The colonoscopy was                         performed without difficulty. The patient tolerated                         the procedure well. The quality of the bowel                         preparation was evaluated using the BBPS [facility] Bowel                         Preparation Scale) with scores of: Right Colon = 3,                         Transverse Colon = 3 and Left Colon = 3 (entire mucosa                         seen  well with no residual staining, small fragments                         of stool or opaque liquid). The total BBPS score                         equals 9. The ileocecal valve, appendiceal orifice,                         and rectum were photographed. Findings:      The perianal and digital rectal examinations were normal. Pertinent       negatives include normal sphincter tone and no palpable rectal lesions.      Many small-mouthed diverticula were found in the sigmoid colon and       descending colon.      The retroflexed view of the distal rectum and anal verge was normal and       showed no anal or rectal abnormalities.      The exam was otherwise without abnormality. Impression:            - Diverticulosis in the sigmoid colon and in the                         descending colon.                        - The distal rectum and anal verge are normal on                         retroflexion view.                        - The examination was otherwise normal.                        -  No specimens collected. Recommendation:        - Discharge patient to home (with escort).                        - Resume previous diet today.                        - Continue present medications.                        - Repeat colonoscopy in 10 years for screening                         purposes. Procedure Code(s):     --- Professional ---                        [id number], Colorectal cancer screening; colonoscopy on                         individual not meeting criteria for high risk Diagnosis Code(s):     --- Professional ---                        Z12.11, Encounter for screening for malignant neoplasm                         of colon                        K57.30, Diverticulosis of large intestine without                         perforation or abscess without bleeding CPT copyright 2022 American Medical Association. All rights reserved. The codes documented in this report are preliminary and upon coder  review may  be revised to meet current compliance requirements. Dr. Ulyess Mort Lin Landsman MD, MD 02/05/2023 8:05:25 AM This report has been signed electronically. Number of Addenda: 0 Note Initiated On: 02/05/2023 7:05 AM Scope Withdrawal Time: 0 hours 9 minutes 39 seconds  Total Procedure Duration: 0 hours 13 minutes 34 seconds  Estimated Blood Loss:  Estimated blood loss: none.      [facility]

## 2023-02-05 NOTE — Anesthesia Preprocedure Evaluation (Signed)
Anesthesia Evaluation  Patient identified by MRN, date of birth, ID band Patient awake    Reviewed: Allergy & Precautions, H&P , NPO status , Patient's Chart, lab work & pertinent test results, reviewed documented beta blocker date and time   History of Anesthesia Complications Negative for: history of anesthetic complications  Airway Mallampati: III  TM Distance: >3 FB Neck ROM: full    Dental  (+) Dental Advidsory Given, Implants, Missing, Teeth Intact   Pulmonary neg shortness of breath, asthma , neg COPD, neg recent URI   Pulmonary exam normal breath sounds clear to auscultation       Cardiovascular Exercise Tolerance: Good hypertension, (-) angina +CHF  (-) Past MI and (-) Cardiac Stents + dysrhythmias Atrial Fibrillation (-) Valvular Problems/Murmurs Rhythm:regular Rate:Normal     Neuro/Psych  PSYCHIATRIC DISORDERS Anxiety Depression    negative neurological ROS     GI/Hepatic Neg liver ROS,GERD  ,,  Endo/Other  neg diabetes  Morbid obesity  Renal/GU negative Renal ROS  negative genitourinary   Musculoskeletal   Abdominal   Peds  Hematology negative hematology ROS (+)   Anesthesia Other Findings Past Medical History: 05/16/2016: Abnormal thyroid function test No date: Anxiety No date: Atrial fibrillation ([place]) 05/16/2016: 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 03/19/2021: Congestive heart failure, unspecified HF chronicity,  unspecified heart failure type ([place]) 05/16/2016: Decreased libido No date: Dysrhythmia 07/17/2020: Gastroesophageal reflux disease without esophagitis 07/17/2020: Hyperlipidemia No date: Hypertension No date: Hypokalemia No date: Impaired fasting glucose 06/01/2015: Morbid obesity ([place]) No date: Morbid obesity ([place]) No date: Plantar fasciitis 07/17/2020: Reactive depression   Reproductive/Obstetrics negative OB ROS                             Anesthesia Physical Anesthesia  Plan  ASA: 3  Anesthesia Plan: General   Post-op Pain Management:    Induction: Intravenous  PONV Risk Score and Plan: Propofol infusion, TIVA and Treatment may vary due to age or medical condition  Airway Management Planned: Natural Airway and Nasal Cannula  Additional Equipment:   Intra-op Plan:   Post-operative Plan:   Informed Consent: I have reviewed the patients History and Physical, chart, labs and discussed the procedure including the risks, benefits and alternatives for the proposed anesthesia with the patient or authorized representative who has indicated his/her understanding and acceptance.     Dental Advisory Given  Plan Discussed with: Anesthesiologist, CRNA and Surgeon  Anesthesia Plan Comments:        Anesthesia Quick Evaluation

## 2023-02-05 NOTE — H&P (Signed)
Cephas Darby, MD [address]  [place]  [place], [place] [address]  Main: [phone]  Fax: [phone] Pager: [phone]  Primary Care Physician:  [name], PA-C Primary Gastroenterologist:  Dr. Cephas Darby  Pre-Procedure History & Physical: HPI:  Lori Oliver is a 52 y.o. female is here for an colonoscopy.   Past Medical History:  Diagnosis Date   Abnormal thyroid function test 05/16/2016   Anxiety    Atrial fibrillation (HCC)    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 05/16/2016   Congestive heart failure, unspecified HF chronicity, unspecified heart failure type ([place]) 03/19/2021   Decreased libido 05/16/2016   Dysrhythmia    Gastroesophageal reflux disease without esophagitis 07/17/2020   Hyperlipidemia 07/17/2020   Hypertension    Hypokalemia    Impaired fasting glucose    Morbid obesity ([place]) 06/01/2015   Morbid obesity ([employer])    Plantar fasciitis    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    Past Surgical History:  Procedure Laterality Date   BREAST BIOPSY Right 2015   benign   ESOPHAGOGASTRODUODENOSCOPY (EGD) WITH PROPOFOL N/A 08/12/2018   Procedure: ESOPHAGOGASTRODUODENOSCOPY (EGD) WITH PROPOFOL;  Surgeon: Toledo, Benay Pike, MD;  Location: [facility] ENDOSCOPY;  Service: Gastroenterology;  Laterality: N/A;   HAMMER TOE SURGERY      Prior to Admission medications   Medication Sig Start Date End Date Taking? Authorizing Provider  diltiazem (CARDIZEM) 30 MG tablet Take 1 tablet (30 mg total) by mouth 3 (three) times daily as needed. 03/18/22  Yes Gollan, Kathlene November, MD  levocetirizine (XYZAL) 5 MG tablet Take 1 tablet (5 mg total) by mouth every evening. 12/04/22  Yes [name], PA-C  predniSONE (DELTASONE) 20 MG tablet 2 tabs poqday 1-3, 1 tabs poqday 4-6 12/04/22  Yes [name], PA-C  propranolol (INDERAL) 20 MG tablet Take 1 tablet (20 mg total) by mouth 3 (three) times daily as needed. 03/18/22  Yes Gollan, Kathlene November, MD  Semaglutide-Weight Management 1 MG/0.5ML SOAJ Inject  1 mg into the skin once a week for 28 days. Patient not taking: Reported on 02/05/2023 01/31/23 02/28/23  [name], PA-C  Semaglutide-Weight Management 1.7 MG/0.75ML SOAJ Inject 1.7 mg into the skin once a week for 28 days. Patient not taking: Reported on 02/05/2023 03/01/23 03/29/23  [name], PA-C  Semaglutide-Weight Management 2.4 MG/0.75ML SOAJ Inject 2.4 mg into the skin once a week for 28 days. 03/30/23 04/27/23  [name], PA-C    Allergies as of 12/06/2022   (No Known Allergies)    Family History  Problem Relation Age of Onset   Hypertension Mother    Heart disease Mother    Arthritis Mother    Depression Mother    Stroke Maternal Grandmother     Social History   Socioeconomic History   Marital status: Widowed    Spouse name: Coralyn Mark   Number of children: 1   Years of education: 12   Highest education level: Some college, no degree  Occupational History   Not on file  Tobacco Use   Smoking status: Never   Smokeless tobacco: Never  Vaping Use   Vaping Use: Never used  Substance and Sexual Activity   Alcohol use: Not Currently    Alcohol/week: 0.0 standard drinks of alcohol    Comment: occ   Drug use: No   Sexual activity: Yes    Partners: Male    Birth control/protection: Abstinence  Other Topics Concern   Not on file  Social History Narrative   Husband  was diagnosed with Cancer this past Summer.   Social Determinants of Health   Financial Resource Strain: Low Risk  (12/04/2022)   Overall Financial Resource Strain (CARDIA)    Difficulty of Paying Living Expenses: Not hard at all  Food Insecurity: No Food Insecurity (12/04/2022)   Hunger Vital Sign    Worried About Running Out of Food in the Last Year: Never true    Ran Out of Food in the Last Year: Never true  Transportation Needs: No Transportation Needs (12/04/2022)   PRAPARE - Hydrologist (Medical): No    Lack of Transportation (Non-Medical): No  Physical Activity:  Insufficiently Active (12/04/2022)   Exercise Vital Sign    Days of Exercise per Week: 5 days    Minutes of Exercise per Session: 20 min  Stress: No Stress Concern Present (12/04/2022)   [place]    Feeling of Stress : Only a little  Social Connections: Moderately Integrated (12/04/2022)   Social Connection and Isolation Panel [NHANES]    Frequency of Communication with Friends and Family: More than three times a week    Frequency of Social Gatherings with Friends and Family: More than three times a week    Attends Religious Services: 1 to 4 times per year    Active Member of [employer] or Organizations: Yes    Attends Archivist Meetings: 1 to 4 times per year    Marital Status: Widowed  Intimate Partner Violence: Not At Risk (12/04/2022)   Humiliation, Afraid, Rape, and Kick questionnaire    Fear of Current or Ex-Partner: No    Emotionally Abused: No    Physically Abused: No    Sexually Abused: No    Review of Systems: See HPI, otherwise negative ROS  Physical Exam: BP (!) 150/105   Pulse 73   Temp (!) 96 F (35.6 C) (Temporal)   Resp 20   Ht 5' 6"$  (1.676 m)   Wt 120.7 kg   LMP 04/15/2020 (Within Days) Comment: Dec 2-5, 2021 spotting really light  SpO2 100%   BMI 42.97 kg/m  General:   Alert,  pleasant and cooperative in NAD Head:  Normocephalic and atraumatic. Neck:  Supple; no masses or thyromegaly. Lungs:  Clear throughout to auscultation.    Heart:  Regular rate and rhythm. Abdomen:  Soft, nontender and nondistended. Normal bowel sounds, without guarding, and without rebound.   Neurologic:  Alert and  oriented x4;  grossly normal neurologically.  Impression/Plan: Lori Oliver is here for an colonoscopy to be performed for colon cancer screening  Risks, benefits, limitations, and alternatives regarding  colonoscopy have been reviewed with the patient.  Questions have been answered.  All  parties agreeable.   Sherri Sear, MD  02/05/2023, 7:32 AM

## 2023-02-05 NOTE — Transfer of Care (Signed)
Immediate Anesthesia Transfer of Care Note  Patient: Lori Oliver  Procedure(s) Performed: Procedure(s): COLONOSCOPY WITH PROPOFOL (N/A)  Patient Location: PACU and Endoscopy Unit  Anesthesia Type:General  Level of Consciousness: sedated  Airway & Oxygen Therapy: Patient Spontanous Breathing and Patient connected to nasal cannula oxygen  Post-op Assessment: Report given to RN and Post -op Vital signs reviewed and stable  Post vital signs: Reviewed and stable  Last Vitals:  Vitals:   02/05/23 0701 02/05/23 0807  BP: (!) 150/105 (!) 98/53  Pulse: 73 79  Resp: 20 13  Temp: (!) 35.6 C (!) 36 C  SpO2: 123XX123 0000000    Complications: No apparent anesthesia complications

## 2023-02-06 ENCOUNTER — Encounter: Payer: Self-pay | Admitting: Gastroenterology

## 2023-02-06 NOTE — Anesthesia Postprocedure Evaluation (Signed)
Anesthesia Post Note  Patient: Lori Oliver  Procedure(s) Performed: COLONOSCOPY WITH PROPOFOL  Patient location during evaluation: Endoscopy Anesthesia Type: General Level of consciousness: awake and alert Pain management: pain level controlled Vital Signs Assessment: post-procedure vital signs reviewed and stable Respiratory status: spontaneous breathing, nonlabored ventilation, respiratory function stable and patient connected to nasal cannula oxygen Cardiovascular status: blood pressure returned to baseline and stable Postop Assessment: no apparent nausea or vomiting Anesthetic complications: no   No notable events documented.   Last Vitals:  Vitals:   02/05/23 0807 02/05/23 0817  BP: (!) 98/53   Pulse: 79   Resp: 13   Temp: (!) 36 C   SpO2: 97% 99%    Last Pain:  Vitals:   02/06/23 0733  TempSrc:   PainSc: 0-No pain                 Martha Clan

## 2023-02-28 ENCOUNTER — Observation Stay: Payer: BC Managed Care – PPO

## 2023-02-28 ENCOUNTER — Observation Stay
Admission: EM | Admit: 2023-02-28 | Discharge: 2023-03-02 | Disposition: A | Discharge status: Home / Self Care | Payer: BC Managed Care – PPO | Attending: Internal Medicine | Admitting: Internal Medicine

## 2023-02-28 ENCOUNTER — Encounter: Payer: Self-pay | Admitting: Emergency Medicine

## 2023-02-28 ENCOUNTER — Other Ambulatory Visit: Payer: Self-pay

## 2023-02-28 DIAGNOSIS — R0602 Shortness of breath: Secondary | ICD-10-CM | POA: Diagnosis not present

## 2023-02-28 DIAGNOSIS — I509 Heart failure, unspecified: Secondary | ICD-10-CM | POA: Diagnosis not present

## 2023-02-28 DIAGNOSIS — I4891 Unspecified atrial fibrillation: Secondary | ICD-10-CM | POA: Diagnosis not present

## 2023-02-28 DIAGNOSIS — Z7985 Long-term (current) use of injectable non-insulin antidiabetic drugs: Secondary | ICD-10-CM | POA: Insufficient documentation

## 2023-02-28 DIAGNOSIS — R0789 Other chest pain: Secondary | ICD-10-CM | POA: Diagnosis not present

## 2023-02-28 DIAGNOSIS — I480 Paroxysmal atrial fibrillation: Secondary | ICD-10-CM | POA: Diagnosis not present

## 2023-02-28 DIAGNOSIS — R7303 Prediabetes: Secondary | ICD-10-CM | POA: Diagnosis present

## 2023-02-28 DIAGNOSIS — E876 Hypokalemia: Secondary | ICD-10-CM | POA: Diagnosis not present

## 2023-02-28 DIAGNOSIS — Z79899 Other long term (current) drug therapy: Secondary | ICD-10-CM | POA: Insufficient documentation

## 2023-02-28 DIAGNOSIS — I110 Hypertensive heart disease with heart failure: Secondary | ICD-10-CM | POA: Diagnosis not present

## 2023-02-28 LAB — COMPREHENSIVE METABOLIC PANEL
ALT: 16 U/L (ref 0–44)
AST: 19 U/L (ref 15–41)
Albumin: 3.7 g/dL (ref 3.5–5.0)
Alkaline Phosphatase: 79 U/L (ref 38–126)
Anion gap: 12 (ref 5–15)
BUN: 18 mg/dL (ref 6–20)
CO2: 27 mmol/L (ref 22–32)
Calcium: 9.3 mg/dL (ref 8.9–10.3)
Chloride: 102 mmol/L (ref 98–111)
Creatinine, Ser: 0.97 mg/dL (ref 0.44–1.00)
GFR, Estimated: >60 mL/min (ref >60)
Glucose, Bld: 124 mg/dL — ABNORMAL HIGH (ref 70–99)
Potassium: 3.3 mmol/L — ABNORMAL LOW (ref 3.5–5.1)
Sodium: 141 mmol/L (ref 135–145)
Total Bilirubin: 0.5 mg/dL (ref 0.3–1.2)
Total Protein: 7.5 g/dL (ref 6.5–8.1)

## 2023-02-28 LAB — CBC
HCT: 40.2 % (ref 36.0–46.0)
Hemoglobin: 13.3 g/dL (ref 12.0–15.0)
MCH: 31.4 pg (ref 26.0–34.0)
MCHC: 33.1 g/dL (ref 30.0–36.0)
MCV: 94.8 fL (ref 80.0–100.0)
Platelets: 274 10*3/uL (ref 150–400)
RBC: 4.24 MIL/uL (ref 3.87–5.11)
RDW: 12.4 % (ref 11.5–15.5)
WBC: 8.6 10*3/uL (ref 4.0–10.5)
nRBC: 0 % (ref 0.0–0.2)

## 2023-02-28 LAB — BRAIN NATRIURETIC PEPTIDE: B Natriuretic Peptide: 33 pg/mL (ref 0.0–100.0)

## 2023-02-28 LAB — TROPONIN I (HIGH SENSITIVITY)
Troponin I (High Sensitivity): 16 ng/L (ref <18)
Troponin I (High Sensitivity): 186 ng/L (ref <18)

## 2023-02-28 LAB — MAGNESIUM: Magnesium: 2 mg/dL (ref 1.7–2.4)

## 2023-02-28 LAB — TSH: TSH: 2.702 u[IU]/mL (ref 0.350–4.500)

## 2023-02-28 MED ORDER — ACETAMINOPHEN 325 MG PO TABS
650.0000 mg | ORAL_TABLET | ORAL | Status: DC | PRN
  Administered 2023-03-01: 650 mg via ORAL
  Filled 2023-02-28: qty 2

## 2023-02-28 MED ORDER — DILTIAZEM HCL 25 MG/5ML IV SOLN
10.0000 mg | Freq: Once | INTRAVENOUS | Status: AC
  Administered 2023-02-28: 10 mg via INTRAVENOUS
  Filled 2023-02-28: qty 5

## 2023-02-28 MED ORDER — ONDANSETRON HCL 4 MG/2ML IJ SOLN: 4.0000 mg | Freq: Four times a day (QID) | INTRAMUSCULAR | Status: DC | PRN

## 2023-02-28 MED ORDER — SODIUM CHLORIDE 0.9 % IV BOLUS
1000.0000 mL | Freq: Once | INTRAVENOUS | Status: AC
  Administered 2023-02-28: 1000 mL via INTRAVENOUS

## 2023-02-28 MED ORDER — DILTIAZEM HCL-DEXTROSE 125-5 MG/125ML-% IV SOLN (PREMIX): 5.0000 mg/h | INTRAVENOUS | Status: DC

## 2023-02-28 MED ORDER — DILTIAZEM HCL 25 MG/5ML IV SOLN
10.0000 mg | Freq: Once | INTRAVENOUS | Status: AC
  Administered 2023-02-28: 10 mg via INTRAVENOUS

## 2023-02-28 MED ORDER — DILTIAZEM HCL 25 MG/5ML IV SOLN
INTRAVENOUS | Status: AC
  Filled 2023-02-28: qty 5

## 2023-02-28 MED ORDER — DILTIAZEM HCL 25 MG/5ML IV SOLN: 10.0000 mg | Freq: Once | INTRAVENOUS | Status: DC

## 2023-02-28 MED ORDER — DILTIAZEM HCL-DEXTROSE 125-5 MG/125ML-% IV SOLN (PREMIX)
INTRAVENOUS | Status: AC
  Administered 2023-02-28: 5 mg/h via INTRAVENOUS
  Filled 2023-02-28: qty 125

## 2023-02-28 MED ORDER — ENOXAPARIN SODIUM 60 MG/0.6ML IJ SOSY
0.5000 mg/kg | PREFILLED_SYRINGE | INTRAMUSCULAR | Status: DC
  Administered 2023-02-28 – 2023-03-01 (×2): 60 mg via SUBCUTANEOUS
  Filled 2023-02-28 (×2): qty 0.6

## 2023-02-28 MED ORDER — POTASSIUM CHLORIDE CRYS ER 20 MEQ PO TBCR
40.0000 meq | EXTENDED_RELEASE_TABLET | Freq: Once | ORAL | Status: AC
  Administered 2023-02-28: 40 meq via ORAL
  Filled 2023-02-28: qty 2

## 2023-02-28 NOTE — Assessment & Plan Note (Signed)
-   40 mEq of potassium ordered - Repeat BMP in the a.m. - Magnesium pending

## 2023-02-28 NOTE — ED Notes (Signed)
Attending at bedside.

## 2023-02-28 NOTE — Consult Note (Signed)
PHARMACIST - PHYSICIAN COMMUNICATION  CONCERNING:  Enoxaparin (Lovenox) for DVT Prophylaxis    RECOMMENDATION: Patient was prescribed enoxaprin 40mg  q24 hours for VTE prophylaxis.   Filed Weights   02/28/23 1453  Weight: 117.9 kg (260 lb)    Body mass index is 41.97 kg/m.  Estimated Creatinine Clearance: 89.6 mL/min (by C-G formula based on SCr of 0.97 mg/dL).   Based on [place] patient is candidate for enoxaparin 0.5mg /kg TBW SQ every 24 hours based on BMI being >30.  DESCRIPTION: Pharmacy has adjusted enoxaparin dose per [facility] policy.  Patient is now receiving enoxaparin 60 mg every 24 hours    Gretel Acre, PharmD PGY1 Pharmacy Resident 02/28/2023 6:11 PM

## 2023-02-28 NOTE — ED Provider Notes (Signed)
[facility] Provider Note    Event Date/Time   First MD Initiated Contact with Patient 02/28/23 1500     (approximate)  History   Chief Complaint: Atrial Fibrillation and Chest Pain  HPI  Lori Oliver is a 52 y.o. female with a past medical history of paroxysmal atrial fibrillation, hypertension, hyperlipidemia, presents emergency department for rapid heart rate and chest tightness.  According to the patient she has a history of paroxysmal atrial fibrillation has diltiazem and propranolol which she takes "as needed."  Patient states she has not needed either of the medications recently as she has not gone into A-fib.  She states however approximate hour prior to arrival she felt like she went into A-fib and felt her heart racing felt some chest tightness and mild shortness of breath so she came to the emergency department for evaluation.  Patient did not take any of her medications.  Here the patient continues to state mild chest tightness and shortness of breath heart rate around 180 to 200 bpm.  Physical Exam   Triage Vital Signs: ED Triage Vitals  Enc Vitals Group     BP 02/28/23 1454 107/85     Pulse Rate 02/28/23 1454 (!) 178     Resp 02/28/23 1454 18     Temp 02/28/23 1454 97.9 F (36.6 C)     Temp Source 02/28/23 1454 Oral     SpO2 02/28/23 1454 98 %     Weight 02/28/23 1453 260 lb (117.9 kg)     Height 02/28/23 1453 5\' 6"  (1.676 m)     Head Circumference --      Peak Flow --      Pain Score 02/28/23 1452 8     Pain Loc --      Pain Edu? --      Excl. in [place]? --     Most recent vital signs: Vitals:   02/28/23 1454  BP: 107/85  Pulse: (!) 178  Resp: 18  Temp: 97.9 F (36.6 C)  SpO2: 98%    General: Awake, no distress.  CV:  Irregular and very fast rhythm around 180 to 200 bpm. Resp:  Normal effort.  Equal breath sounds bilaterally.  No wheeze rales or rhonchi Abd:  No distention.  Soft, nontender.  No rebound or guarding.   Obese. Other:  No leg tenderness or edema.   ED Results / Procedures / Treatments   EKG  EKG viewed and interpreted by myself shows atrial fibrillation with rapid ventricular response at 178 bpm with a narrow QRS, normal axis, normal intervals, nonspecific ST changes with some slight ST depression likely demand ischemia.  MEDICATIONS ORDERED IN ED: Medications  diltiazem (CARDIZEM) injection 10 mg (has no administration in time range)  sodium chloride 0.9 % bolus 1,000 mL (has no administration in time range)     IMPRESSION / MDM / ASSESSMENT AND PLAN / ED COURSE  I reviewed the triage vital signs and the nursing notes.  Patient's presentation is most consistent with acute presentation with potential threat to life or bodily function.  Patient presents emergency department what appears to be atrial fibrillation with rapid ventricular response.  Patient states a history of proximal atrial fibrillation but it has been a very long time since she has gone to A-fib per patient.  Patient follows up with Dr. [name] of cardiology.  We will check labs including cardiac enzyme CBC and chemistry.  We will dose 10 mg of IV diltiazem  and 1 L of normal saline and continue to closely monitor.  Patient's lab work is reassuring, normal CBC, normal chemistry, negative troponin.  Patient has received 2 rounds of IV diltiazem heart rate did briefly improved around 110 120 bpm is now back to 140 bpm continues to be A-fib RVR.  Will start the patient on a diltiazem infusion and admit to the hospital service for further workup and treatment.  Patient agreeable to plan.  CRITICAL CARE Performed by: Harvest Dark   Total critical care time: 30 minutes  Critical care time was exclusive of separately billable procedures and treating other patients.  Critical care was necessary to treat or prevent imminent or life-threatening deterioration.  Critical care was time spent personally by me on the following  activities: development of treatment plan with patient and/or surrogate as well as nursing, discussions with consultants, evaluation of patient's response to treatment, examination of patient, obtaining history from patient or surrogate, ordering and performing treatments and interventions, ordering and review of laboratory studies, ordering and review of radiographic studies, pulse oximetry and re-evaluation of patient's condition.   FINAL CLINICAL IMPRESSION(S) / ED DIAGNOSES   Atrial fibrillation with rapid ventricular response    Note:  This document was prepared using Dragon voice recognition software and may include unintentional dictation errors.   Harvest Dark, MD 02/28/23 1651

## 2023-02-28 NOTE — ED Notes (Addendum)
Dr. Charleen Kirks and Rodman Key, RN floor RN taking pt. Notified that pt. Has had almost 3L urinary output in the last 3 hours.

## 2023-02-28 NOTE — H&P (Signed)
History and Physical    Patient: Lori Oliver [id number] DOB: Jan 16, 1971 DOA: 02/28/2023 DOS: the patient was seen and examined on 02/28/2023 PCP: [name], PA-C  Patient coming from: Home  Chief Complaint:  Chief Complaint  Patient presents with   Atrial Fibrillation   Chest Pain   HPI: Lori Oliver is a 53 y.o. female with medical history significant of paroxysmal atrial fibrillation, HTN, HLD, obesity, presents due to elevated heart rate.   [name] states she was in her usual state of health this afternoon when she was at work and had sudden onset palpitations, chest pain and shortness of breath.  She describes the chest pain as a tightness sensation in her substernal region.  It did not radiate elsewhere.  She denies any recent changes in her medications or the addition of any supplements/vitamins.  She denies any alcohol use within the last 24 hours.  She denies any recent illness including fever, chills, nausea, vomiting or diarrhea.  She notes that the last time she had to use her as needed diltiazem was approximately 8 to 9 months ago.  She occasionally has short bursts of elevated heart rate but it resolves within a few seconds.  Per chart review, patient has 2 prior episodes of admission for atrial fibrillation.  The first was in 2018 and felt to be due to [facility] administration.  The second was in 2020 with unknown etiology.  Holter monitoring postadmission with no evidence of atrial fibrillation.  Currently on diltiazem and propranolol only as needed with last follow-up with cardiology in 2023.  ED Course:  On arrival to the ED, patient was normotensive at 107/85 with heart rate of 186.  She was saturating at 98% on room air.  She was afebrile at 97.9.  Initial workup notable for WBC of 8.6, hemoglobin of 13.3, potassium 3.3, glucose 124, creatinine 0.96 and GFR above 60.  Initial troponin negative at 16.  Patient given 2 doses of IV diltiazem with minimal response  and heart rate.  Due to that, patient was started on diltiazem infusion.  TRH contacted for admission.  Review of Systems: As mentioned in the history of present illness. All other systems reviewed and are negative.  Past Medical History:  Diagnosis Date   Abnormal thyroid function test 05/16/2016   Anxiety    Atrial fibrillation (HCC)    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 05/16/2016   Congestive heart failure, unspecified HF chronicity, unspecified heart failure type ([place]) 03/19/2021   Decreased libido 05/16/2016   Dysrhythmia    Gastroesophageal reflux disease without esophagitis 07/17/2020   Hyperlipidemia 07/17/2020   Hypertension    Hypokalemia    Impaired fasting glucose    Morbid obesity ([place]) 06/01/2015   Morbid obesity ([place])    Plantar fasciitis    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   Past Surgical History:  Procedure Laterality Date   BREAST BIOPSY Right 2015   benign   COLONOSCOPY WITH PROPOFOL N/A 02/05/2023   Procedure: COLONOSCOPY WITH PROPOFOL;  Surgeon: Lin Landsman, MD;  Location: [place];  Service: Gastroenterology;  Laterality: N/A;   ESOPHAGOGASTRODUODENOSCOPY (EGD) WITH PROPOFOL N/A 08/12/2018   Procedure: ESOPHAGOGASTRODUODENOSCOPY (EGD) WITH PROPOFOL;  Surgeon: Toledo, Benay Pike, MD;  Location: [facility];  Service: Gastroenterology;  Laterality: N/A;   HAMMER TOE SURGERY     Social History:  reports that she has never smoked. She has never used smokeless tobacco. She reports that she does not currently use alcohol. She reports that she does not  use drugs.  No Known Allergies  Family History  Problem Relation Age of Onset   Hypertension Mother    Heart disease Mother    Arthritis Mother    Depression Mother    Stroke Maternal Grandmother     Prior to Admission medications   Medication Sig Start Date End Date Taking? Authorizing Provider  diltiazem (CARDIZEM) 30 MG tablet Take 1 tablet (30 mg total) by mouth 3 (three) times daily as needed. 03/18/22    Minna Merritts, MD  levocetirizine (XYZAL) 5 MG tablet Take 1 tablet (5 mg total) by mouth every evening. 12/04/22   [name], PA-C  predniSONE (DELTASONE) 20 MG tablet 2 tabs poqday 1-3, 1 tabs poqday 4-6 12/04/22   [name], PA-C  propranolol (INDERAL) 20 MG tablet Take 1 tablet (20 mg total) by mouth 3 (three) times daily as needed. 03/18/22   Minna Merritts, MD  Semaglutide-Weight Management 2.4 MG/0.75ML SOAJ Inject 2.4 mg into the skin once a week for 28 days. 03/30/23 04/27/23  [name], PA-C    Physical Exam: Vitals:   02/28/23 1530 02/28/23 1545 02/28/23 1600 02/28/23 1819  BP: (!) 161/106 (!) 140/90 (!) 134/96 (!) 134/92  Pulse: (!) 52 (!) 120 (!) 116 71  Resp: 11 14 13 18   Temp:    98.5 F (36.9 C)  TempSrc:      SpO2: 100% 97% 99% 97%  Weight:      Height:       Physical Exam Vitals and nursing note reviewed.  Constitutional:      General: She is not in acute distress.    Appearance: She is not toxic-appearing.  HENT:     Head: Normocephalic and atraumatic.  Eyes:     Extraocular Movements: Extraocular movements intact.     Pupils: Pupils are equal, round, and reactive to light.  Cardiovascular:     Rate and Rhythm: Tachycardia present. Rhythm irregularly irregular.     Heart sounds: No murmur heard. Pulmonary:     Effort: No accessory muscle usage.     Breath sounds: No decreased breath sounds, wheezing, rhonchi or rales.  Abdominal:     General: Bowel sounds are normal.  Musculoskeletal:     Cervical back: Neck supple.     Right lower leg: No edema.     Left lower leg: No edema.  Skin:    General: Skin is warm and dry.  Neurological:     General: No focal deficit present.     Mental Status: She is alert and oriented to person, place, and time.  Psychiatric:        Mood and Affect: Mood normal.        Behavior: Behavior normal.    Data Reviewed: CBC with WBC of 8.6, hemoglobin of 13.3, and platelets of 274 CMP with sodium of 141,  potassium 3.3, bicarb 27, glucose 124, BUN 18, creatinine 0.97, AST 19, ALT 16 and GFR above 60 Initial troponin 16  EKG personally reviewed.  Narrow complex tachycardia consistent with atrial fibrillation with rate of 178.  Minimal ST depression in inferior leads but less than 2 mm.  There are no new results to review at this time.  Assessment and Plan:  * Atrial fibrillation with RVR ([place]) Patient presenting with atrial fibrillation with RVR with onset approximately 1 hour prior to arrival with elevated heart rate, SOB, and chest pain.  Initial troponin is negative.  EKG with RVR but no ischemic changes. Prior history  of similar presentations.  CHA2DS2-VASc of 2.  Not currently on anticoagulation.  - Telemetry monitoring - Continue diltiazem infusion for goal heart rate less than 105 - Replace potassium and check magnesium - Echocardiogram given +4 years since last for evaluation of LA - BNP and chest x-ray to evaluate for hypervolemia given persistent SOB - Repeat troponin pending  Prediabetes Currently on Ozempic.  No indication for SSI at this time  Hypokalemia - 40 mEq of potassium ordered - Repeat BMP in the a.m. - Magnesium pending  Advance Care Planning:   Code Status: Full Code   Consults: None  Family Communication: Patient's fianc updated at bedside  Severity of Illness: The appropriate patient status for this patient is OBSERVATION. Observation status is judged to be reasonable and necessary in order to provide the required intensity of service to ensure the patient's safety. The patient's presenting symptoms, physical exam findings, and initial radiographic and laboratory data in the context of their medical condition is felt to place them at decreased risk for further clinical deterioration. Furthermore, it is anticipated that the patient will be medically stable for discharge from the hospital within 2 midnights of admission.   Author: Jose Persia,  MD 02/28/2023 6:39 PM  For on call review [email or web address].

## 2023-02-28 NOTE — Assessment & Plan Note (Addendum)
Patient presenting with atrial fibrillation with RVR with onset approximately 1 hour prior to arrival with elevated heart rate, SOB, and chest pain.  Initial troponin is negative.  EKG with RVR but no ischemic changes. Prior history of similar presentations.  CHA2DS2-VASc of 2.  Not currently on anticoagulation.  - Telemetry monitoring - Continue diltiazem infusion for goal heart rate less than 105 - Replace potassium and check magnesium - Echocardiogram given +4 years since last for evaluation of LA - BNP and chest x-ray to evaluate for hypervolemia given persistent SOB - Repeat troponin pending

## 2023-02-28 NOTE — ED Triage Notes (Signed)
Pt reports going in and out of afib. States around 1 pm today, HR was 195 and she was SOB. Endorses chest pressure. Hx of afib, not on blood thinner.

## 2023-02-28 NOTE — Assessment & Plan Note (Signed)
Currently on Ozempic.  No indication for SSI at this time

## 2023-03-01 ENCOUNTER — Observation Stay (HOSPITAL_BASED_OUTPATIENT_CLINIC_OR_DEPARTMENT_OTHER)
Admit: 2023-03-01 | Discharge: 2023-03-01 | Disposition: A | Discharge status: Home / Self Care | Payer: BC Managed Care – PPO | Attending: Internal Medicine | Admitting: Internal Medicine

## 2023-03-01 DIAGNOSIS — I4891 Unspecified atrial fibrillation: Secondary | ICD-10-CM

## 2023-03-01 LAB — BASIC METABOLIC PANEL
Anion gap: 5 (ref 5–15)
BUN: 19 mg/dL (ref 6–20)
CO2: 25 mmol/L (ref 22–32)
Calcium: 8.5 mg/dL — ABNORMAL LOW (ref 8.9–10.3)
Chloride: 108 mmol/L (ref 98–111)
Creatinine, Ser: 0.9 mg/dL (ref 0.44–1.00)
GFR, Estimated: >60 mL/min (ref >60)
Glucose, Bld: 110 mg/dL — ABNORMAL HIGH (ref 70–99)
Potassium: 3.9 mmol/L (ref 3.5–5.1)
Sodium: 138 mmol/L (ref 135–145)

## 2023-03-01 LAB — ECHOCARDIOGRAM COMPLETE
AR max vel: 2.27 cm2
AV Area VTI: 2.33 cm2
AV Area mean vel: 2.19 cm2
AV Mean grad: 4 mmHg
AV Peak grad: 8.4 mmHg
Ao pk vel: 1.45 m/s
Area-P 1/2: 5.97 cm2
Calc EF: 58.9 %
Height: 66 in
S' Lateral: 2.9 cm
Single Plane A2C EF: 44.8 %
Single Plane A4C EF: 68.4 %
Weight: 4160 oz

## 2023-03-01 LAB — MAGNESIUM: Magnesium: 2.2 mg/dL (ref 1.7–2.4)

## 2023-03-01 LAB — TROPONIN I (HIGH SENSITIVITY): Troponin I (High Sensitivity): 143 ng/L (ref <18)

## 2023-03-01 LAB — HIV ANTIBODY (ROUTINE TESTING W REFLEX): HIV Screen 4th Generation wRfx: NONREACTIVE

## 2023-03-01 MED ORDER — DILTIAZEM HCL 30 MG PO TABS
30.0000 mg | ORAL_TABLET | Freq: Four times a day (QID) | ORAL | Status: DC
  Administered 2023-03-01 – 2023-03-02 (×5): 30 mg via ORAL
  Filled 2023-03-01 (×5): qty 1

## 2023-03-01 MED ORDER — PERFLUTREN LIPID MICROSPHERE
1.0000 mL | INTRAVENOUS | Status: AC | PRN
  Administered 2023-03-01: 4 mL via INTRAVENOUS

## 2023-03-01 NOTE — Plan of Care (Signed)
Patient AOX4, VSS throughout shift.  All meds given on time as ordered.  Pt in A. Fib HR above 105 on Cardizem drip at max rate.  Provider notified and one time bolus dose of Cardizem given for relief.  Pt HR within parameters afterwards rest of shift.  At 0331, HR NSR.  Pt ate dinner last night and standby assist to bathroom.  Pt stated chest pressure resolved after Cardizem bolus.  POC maintained, will continue to monitor.   Problem: Education: Goal: Knowledge of General Education information will improve Description: Including pain rating scale, medication(s)/side effects and non-pharmacologic comfort measures Outcome: Progressing   Problem: Health Behavior/Discharge Planning: Goal: Ability to manage health-related needs will improve Outcome: Progressing   Problem: Clinical Measurements: Goal: Ability to maintain clinical measurements within normal limits will improve Outcome: Progressing Goal: Will remain free from infection Outcome: Progressing Goal: Diagnostic test results will improve Outcome: Progressing Goal: Respiratory complications will improve Outcome: Progressing Goal: Cardiovascular complication will be avoided Outcome: Progressing   Problem: Activity: Goal: Risk for activity intolerance will decrease Outcome: Progressing   Problem: Nutrition: Goal: Adequate nutrition will be maintained Outcome: Progressing   Problem: Coping: Goal: Level of anxiety will decrease Outcome: Progressing   Problem: Elimination: Goal: Will not experience complications related to bowel motility Outcome: Progressing Goal: Will not experience complications related to urinary retention Outcome: Progressing   Problem: Pain Managment: Goal: General experience of comfort will improve Outcome: Progressing   Problem: Safety: Goal: Ability to remain free from injury will improve Outcome: Progressing   Problem: Skin Integrity: Goal: Risk for impaired skin integrity will  decrease Outcome: Progressing

## 2023-03-01 NOTE — Progress Notes (Signed)
During shift report, noticed bag of Cardizem gtt was empty and turned off. Per pt, it has been off for longer than 2 hours. Per nurse, someone turned it off due to beeping without letting her know. Also per nurse, she converted to normal sinus rhythm at 3:31am. Confirmed conversion with CCMD. Per CCMD, She is still in sinus rhythm with HR of 67, no chest pain per pt. Dr. Wende Crease notified.

## 2023-03-01 NOTE — Progress Notes (Signed)
Progress Note   Patient: Lori Oliver [id number] DOB: 03/04/1971 DOA: 02/28/2023     0 DOS: the patient was seen and examined on 03/01/2023   Brief hospital course:   Harriet SHANEKIA NEALEY is a 52 y.o. female with medical history significant of paroxysmal atrial fibrillation, HTN, HLD, obesity, presents due to elevated heart rate.    Ms. Zimbelman states she was in her usual state of health this afternoon when she was at work and had sudden onset palpitations, chest pain and shortness of breath.  She describes the chest pain as a tightness sensation in her substernal region.  It did not radiate elsewhere.  She denies any recent changes in her medications or the addition of any supplements/vitamins.  She denies any alcohol use within the last 24 hours.  She denies any recent illness including fever, chills, nausea, vomiting or diarrhea.  She notes that the last time she had to use her as needed diltiazem was approximately 8 to 9 months ago.  She occasionally has short bursts of elevated heart rate but it resolves within a few seconds.   Per chart review, patient has 2 prior episodes of admission for atrial fibrillation.  The first was in 2018 and felt to be due to [facility] administration.  The second was in 2020 with unknown etiology.  Holter monitoring postadmission with no evidence of atrial fibrillation.  Currently on diltiazem and propranolol only as needed with last follow-up with cardiology in 2023.   ED Course:  On arrival to the ED, patient was normotensive at 107/85 with heart rate of 186.  She was saturating at 98% on room air.  She was afebrile at 97.9.  Initial workup notable for WBC of 8.6, hemoglobin of 13.3, potassium 3.3, glucose 124, creatinine 0.96 and GFR above 60.  Initial troponin negative at 16.  Patient given 2 doses of IV diltiazem with minimal response and heart rate.  Due to that, patient was started on diltiazem infusion.  TRH contacted for admission.  3/16 : A-fib with RVR  resolved patient asymptomatic with no complaint of shortness of breath or chest pain or palpitation.    Had mild elevated troponin which plateaued likely secondary demand.  Echo pending at the time of note.  Cardizem drip discontinued.  Patient put on Cardizem 30 mg every 6 hourly.  Which patient has been taking as as needed outpatient setting.  Assessment and Plan:  Atrial fibrillation with RVR   Patient presenting with atrial fibrillation with RVR with onset approximately 1 hour prior to arrival with elevated heart rate, SOB, and chest pain.  Initial troponin is negative.  EKG with RVR but no ischemic changes. Prior history of similar presentations.  CHA2DS2-VASc of 2.  Not currently on anticoagulation.  - Telemetry monitoring - Continue diltiazem infusion for goal heart rate less than 105 - Replace potassium and check magnesium - Echocardiogram given +4 years since last for evaluation of LA - BNP and chest x-ray to evaluate for hypervolemia given persistent SOB - Repeat troponin pending  Prediabetes Currently on Ozempic.  No indication for SSI at this time  Hypokalemia - 40 mEq of potassium ordered - Repeat BMP in the a.m. - Magnesium pending   Subjective: Patient seen and examined this morning.  No overnight events.  Vital labs and imaging reviewed.  Heart rate now well-controlled patient was transitioned from Cardizem drip to p.o. Cardizem.  Echo pending   Physical Exam: Vitals:   02/28/23 1859 02/28/23 2017 03/01/23 0130 03/01/23  0407  BP: (!) 134/101 125/77 96/72 (!) 143/63  Pulse:  87 83 78  Resp:  19 20 19   Temp:  98.4 F (36.9 C) 98.2 F (36.8 C) 97.8 F (36.6 C)  TempSrc:      SpO2:  99% 99% 97%  Weight:      Height:       Physical Exam HENT:     Head: Normocephalic and atraumatic.     Nose: Nose normal.     Mouth/Throat:     Mouth: Mucous membranes are moist.  Eyes:     Pupils: Pupils are equal, round, and reactive to light.  Cardiovascular:     Rate and  Rhythm: Normal rate and regular rhythm.     Heart sounds: Normal heart sounds.  Pulmonary:     Effort: Pulmonary effort is normal.     Breath sounds: Normal breath sounds.  Chest:     Chest wall: No mass or tenderness.  Abdominal:     Palpations: Abdomen is soft.  Musculoskeletal:        General: Normal range of motion.     Cervical back: Normal range of motion.  Skin:    General: Skin is warm.  Neurological:     General: No focal deficit present.     Mental Status: She is alert and oriented to person, place, and time.  Psychiatric:        Mood and Affect: Mood normal.        Behavior: Behavior normal.        Thought Content: Thought content normal.     Data Reviewed:  There are no new results to review at this time.  Family Communication: Fianc by bedside updated about the status. Disposition: Status is: Observation The patient remains OBS appropriate and will d/c before 2 midnights.  Planned Discharge Destination: Home    Time spent: 35 minutes  Author: Oran Rein, MD 03/01/2023 7:39 AM  For on call review [email or web address].

## 2023-03-02 DIAGNOSIS — I4891 Unspecified atrial fibrillation: Secondary | ICD-10-CM | POA: Diagnosis not present

## 2023-03-02 LAB — BASIC METABOLIC PANEL
Anion gap: 5 (ref 5–15)
BUN: 14 mg/dL (ref 6–20)
CO2: 25 mmol/L (ref 22–32)
Calcium: 8.6 mg/dL — ABNORMAL LOW (ref 8.9–10.3)
Chloride: 105 mmol/L (ref 98–111)
Creatinine, Ser: 0.84 mg/dL (ref 0.44–1.00)
GFR, Estimated: >60 mL/min (ref >60)
Glucose, Bld: 107 mg/dL — ABNORMAL HIGH (ref 70–99)
Potassium: 4 mmol/L (ref 3.5–5.1)
Sodium: 135 mmol/L (ref 135–145)

## 2023-03-02 LAB — MAGNESIUM: Magnesium: 2.1 mg/dL (ref 1.7–2.4)

## 2023-03-02 MED ORDER — ASPIRIN 81 MG PO CHEW: 81.0000 mg | CHEWABLE_TABLET | Freq: Once | ORAL | Status: DC

## 2023-03-02 MED ORDER — DILTIAZEM HCL 30 MG PO TABS: 30.0000 mg | ORAL_TABLET | Freq: Three times a day (TID) | ORAL | 1 refills | Status: DC

## 2023-03-02 MED ORDER — ASPIRIN 81 MG PO CHEW: 81.0000 mg | CHEWABLE_TABLET | Freq: Once | ORAL | 0 refills | Status: AC

## 2023-03-02 NOTE — Discharge Summary (Signed)
Physician Discharge Summary   Patient: Lori Oliver MRN: [id number] DOB: 14-Nov-1971  Admit date:     02/28/2023  Discharge date: 03/02/23  Discharge Physician: Oran Rein   PCP: [name], PA-C   Recommendations at discharge:    Follow up with cardiology in 2 to 4 weeks Follow-up with PCP in 2 to 4 weeks.  Discharge Diagnoses: Principal Problem:   Atrial fibrillation with RVR (HCC) Active Problems:   Hypokalemia   Prediabetes  Resolved Problems:   * No resolved hospital problems. *  Hospital Course: 52 y.o. female with medical history significant of paroxysmal atrial fibrillation, HTN, HLD, obesity, presents due to elevated heart rate.    [name] states she was in her usual state of health this afternoon when she was at work and had sudden onset palpitations, chest pain and shortness of breath.  She describes the chest pain as a tightness sensation in her substernal region.  It did not radiate elsewhere.  She denies any recent changes in her medications or the addition of any supplements/vitamins.  She denies any alcohol use within the last 24 hours.  She denies any recent illness including fever, chills, nausea, vomiting or diarrhea.  She notes that the last time she had to use her as needed diltiazem was approximately 8 to 9 months ago.  She occasionally has short bursts of elevated heart rate but it resolves within a few seconds.   Per chart review, patient has 2 prior episodes of admission for atrial fibrillation.  The first was in 2018 and felt to be due to [facility] administration.  The second was in 2020 with unknown etiology.  Holter monitoring postadmission with no evidence of atrial fibrillation.  Currently on diltiazem and propranolol only as needed with last follow-up with cardiology in 2023.   ED Course:  On arrival to the ED, patient was normotensive at 107/85 with heart rate of 186.  She was saturating at 98% on room air.  She was afebrile at 97.9.  Initial  workup notable for WBC of 8.6, hemoglobin of 13.3, potassium 3.3, glucose 124, creatinine 0.96 and GFR above 60.  Initial troponin negative at 16.  Patient given 2 doses of IV diltiazem with minimal response and heart rate.  Due to that, patient was started on diltiazem infusion.  TRH contacted for admission.   3/16 : A-fib with RVR resolved patient asymptomatic with no complaint of shortness of breath or chest pain or palpitation.    Had mild elevated troponin which plateaued likely secondary demand.  Echo pending at the time of note.  Cardizem drip discontinued.  Patient put on Cardizem 30 mg every 6 hourly.  Which patient has been taking as as needed outpatient setting.  3/17 : Patient continues to stay on Cardizem 30 mg every 6 hours, will be discharged on Cardizem 30 mg 3 times daily scheduled doses.  Patient was hemodynamically stable with no complaint of chest pain or palpitations.  Patient discharged with follow-up with cardiology.  Patient had CHA2DS2-VASc score of 2 however did not want anticoagulation.  Will start the patient on aspirin 81 mg daily.  Cardiology to discuss in outpatient setting about anticoagulations.  Assessment and Plan:  * Atrial fibrillation with RVR ([place]) Patient presenting with atrial fibrillation with RVR with onset approximately 1 hour prior to arrival with elevated heart rate, SOB, and chest pain.  Initial troponin is negative.  EKG with RVR but no ischemic changes. Prior history of similar presentations.  CHA2DS2-VASc of  2.    - Telemetry monitoring - Continue diltiazem infusion for goal heart rate less than 105 - Replace potassium and check magnesium - Echocardiogram given +4 years since last for evaluation of LA - BNP and chest x-ray to evaluate for hypervolemia given persistent SOB - Repeat troponin pending  Prediabetes Currently on Ozempic.  No indication for SSI at this time  Hypokalemia - 40 mEq of potassium ordered - Repeat BMP in the a.m. -  Magnesium      Pain control - [facility] Controlled Substance Reporting System database was reviewed. and patient was instructed, not to drive, operate heavy machinery, perform activities at heights, swimming or participation in water activities or provide baby-sitting services while on Pain, Sleep and Anxiety Medications; until their outpatient Physician has advised to do so again. Also recommended to not to take more than prescribed Pain, Sleep and Anxiety Medications.  Consultants: None  Procedures performed: EKG, Echo  Disposition: Home Diet recommendation:  Discharge Diet Orders (From admission, onward)     Start     Ordered   03/02/23 0000  Diet - low sodium heart healthy        03/02/23 1039           Cardiac diet DISCHARGE MEDICATION: Allergies as of 03/02/2023   No Known Allergies      Medication List     TAKE these medications    aspirin 81 MG chewable tablet Chew 1 tablet (81 mg total) by mouth once for 1 dose.   diltiazem 30 MG tablet Commonly known as: CARDIZEM Take 1 tablet (30 mg total) by mouth 3 (three) times daily. What changed:  when to take this reasons to take this   levocetirizine 5 MG tablet Commonly known as: XYZAL Take 1 tablet (5 mg total) by mouth every evening.   multivitamin with minerals Tabs tablet Take 1 tablet by mouth daily.   predniSONE 20 MG tablet Commonly known as: DELTASONE 2 tabs poqday 1-3, 1 tabs poqday 4-6   propranolol 20 MG tablet Commonly known as: INDERAL Take 1 tablet (20 mg total) by mouth 3 (three) times daily as needed.   Semaglutide-Weight Management 2.4 MG/0.75ML Soaj Inject 2.4 mg into the skin once a week for 28 days. Start taking on: March 30, 2023        Discharge Exam: Lori Oliver Weights   02/28/23 1453  Weight: 117.9 kg   Physical Exam Constitutional:      Appearance: She is obese.  HENT:     Head: Normocephalic.     Mouth/Throat:     Mouth: Mucous membranes are moist.  Eyes:      Pupils: Pupils are equal, round, and reactive to light.  Cardiovascular:     Rate and Rhythm: Normal rate and regular rhythm.  Pulmonary:     Effort: Pulmonary effort is normal.  Abdominal:     Palpations: Abdomen is soft.  Musculoskeletal:        General: Normal range of motion.     Cervical back: Normal range of motion.  Skin:    General: Skin is warm.  Neurological:     General: No focal deficit present.     Mental Status: She is alert and oriented to person, place, and time.  Psychiatric:        Mood and Affect: Mood normal.        Behavior: Behavior normal.      Condition at discharge: good  The results of significant diagnostics from this  hospitalization (including imaging, microbiology, ancillary and laboratory) are listed below for reference.   Imaging Studies: ECHOCARDIOGRAM COMPLETE  Result Date: 03/01/2023    ECHOCARDIOGRAM REPORT   Patient Name:   NETHANIA MESKIMEN Date of Exam: 03/01/2023 Medical Rec #:  [id number]        Height:       66.0 in Accession #:    [id number]       Weight:       260.0 lb Date of Birth:  01/15/1971         BSA:          2.236 m Patient Age:    52 years         BP:           96/72 mmHg Patient Gender: F                HR:           68 bpm. Exam Location:  [facility] Procedure: 2D Echo, Color Doppler, Cardiac Doppler and Intracardiac            Opacification Agent Indications:     Atrial Fibrillation I48.91  History:         Patient has prior history of Echocardiogram examinations. CHF,                  Arrythmias:Atrial Fibrillation; Risk Factors:Hypertension,                  Morbid Obesity and Dyslipidemia.  Sonographer:     L. Thornton-Maynard Referring Phys:  [id number] Jose Persia Diagnosing Phys: Ida Rogue MD  Sonographer Comments: Suboptimal apical window. Image acquisition challenging due to patient body habitus. IMPRESSIONS  1. Left ventricular ejection fraction, by estimation, is 60 to 65%. The left ventricle has normal function. The left  ventricle has no regional wall motion abnormalities. Left ventricular diastolic parameters were normal.  2. Right ventricular systolic function is normal. The right ventricular size is normal. Tricuspid regurgitation signal is inadequate for assessing PA pressure.  3. The mitral valve is normal in structure. Mild mitral valve regurgitation. No evidence of mitral stenosis.  4. The aortic valve is tricuspid. Aortic valve regurgitation is not visualized. No aortic stenosis is present.  5. The inferior vena cava is normal in size with greater than 50% respiratory variability, suggesting right atrial pressure of 3 mmHg.  6. Rhythm is normal sinus FINDINGS  Left Ventricle: Left ventricular ejection fraction, by estimation, is 60 to 65%. The left ventricle has normal function. The left ventricle has no regional wall motion abnormalities. Definity contrast agent was given IV to delineate the left ventricular  endocardial borders. The left ventricular internal cavity size was normal in size. There is no left ventricular hypertrophy. Left ventricular diastolic parameters were normal. Right Ventricle: The right ventricular size is normal. No increase in right ventricular wall thickness. Right ventricular systolic function is normal. Tricuspid regurgitation signal is inadequate for assessing PA pressure. Left Atrium: Left atrial size was normal in size. Right Atrium: Right atrial size was normal in size. Pericardium: There is no evidence of pericardial effusion. Mitral Valve: The mitral valve is normal in structure. Mild mitral valve regurgitation. No evidence of mitral valve stenosis. Tricuspid Valve: The tricuspid valve is normal in structure. Tricuspid valve regurgitation is not demonstrated. No evidence of tricuspid stenosis. Aortic Valve: The aortic valve is tricuspid. Aortic valve regurgitation is not visualized. No aortic stenosis is present. Aortic valve mean gradient measures 4.0  mmHg. Aortic valve peak gradient  measures 8.4 mmHg. Aortic valve area, by VTI measures 2.33 cm. Pulmonic Valve: The pulmonic valve was normal in structure. Pulmonic valve regurgitation is trivial. No evidence of pulmonic stenosis. Aorta: The aortic root is normal in size and structure. Venous: The inferior vena cava is normal in size with greater than 50% respiratory variability, suggesting right atrial pressure of 3 mmHg. IAS/Shunts: No atrial level shunt detected by color flow Doppler.  LEFT VENTRICLE PLAX 2D LVIDd:         4.40 cm     Diastology LVIDs:         2.90 cm     LV e' medial:    9.57 cm/s LV PW:         1.10 cm     LV E/e' medial:  9.6 LV IVS:        1.30 cm     LV e' lateral:   8.05 cm/s LVOT diam:     2.20 cm     LV E/e' lateral: 11.5 LV SV:         72 LV SV Index:   32 LVOT Area:     3.80 cm  LV Volumes (MOD) LV vol d, MOD A2C: 65.9 ml LV vol d, MOD A4C: 97.2 ml LV vol s, MOD A2C: 36.4 ml LV vol s, MOD A4C: 30.7 ml LV SV MOD A2C:     29.5 ml LV SV MOD A4C:     97.2 ml LV SV MOD BP:      47.8 ml RIGHT VENTRICLE RV S prime:     10.80 cm/s LEFT ATRIUM             Index LA diam:        3.10 cm 1.39 cm/m LA Vol (A2C):   43.6 ml 19.50 ml/m LA Vol (A4C):   42.9 ml 19.19 ml/m LA Biplane Vol: 46.0 ml 20.58 ml/m  AORTIC VALVE                    PULMONIC VALVE AV Area (Vmax):    2.27 cm     PV Vmax:          0.77 m/s AV Area (Vmean):   2.19 cm     PV Peak grad:     2.4 mmHg AV Area (VTI):     2.33 cm     PR End Diast Vel: 2.70 msec AV Vmax:           145.00 cm/s AV Vmean:          96.500 cm/s AV VTI:            0.308 m AV Peak Grad:      8.4 mmHg AV Mean Grad:      4.0 mmHg LVOT Vmax:         86.50 cm/s LVOT Vmean:        55.500 cm/s LVOT VTI:          0.189 m LVOT/AV VTI ratio: 0.61  AORTA Ao Root diam: 3.00 cm Ao Asc diam:  3.70 cm MITRAL VALVE MV Area (PHT): 5.97 cm    SHUNTS MV Decel Time: 127 msec    Systemic VTI:  0.19 m MV E velocity: 92.20 cm/s  Systemic Diam: 2.20 cm MV A velocity: 63.40 cm/s MV E/A ratio:  1.45 Ida Rogue MD Electronically signed by Ida Rogue MD Signature Date/Time: 03/01/2023/11:56:08 AM    Final  Portable chest x-ray 1 view  Result Date: 02/28/2023 CLINICAL DATA:  Shortness of breath, atrial fibrillation. EXAM: PORTABLE CHEST 1 VIEW COMPARISON:  Chest radiograph dated 05/28/2022. FINDINGS: The heart is mildly enlarged. Both lungs are clear. Degenerative changes are seen in the spine. IMPRESSION: Mild cardiomegaly. No active disease. Electronically Signed   By: Zerita Boers M.D.   On: 02/28/2023 18:24    Microbiology: Results for orders placed or performed in visit on 11/20/21  Novel Coronavirus, NAA ([facility])     Status: None   Collection Time: 11/21/21 12:00 AM   Specimen: Nasopharyngeal(NP) swabs in vial transport medium   Nasopharynge  Previous  Result Value Ref Range Status   SARS-CoV-2, NAA Not Detected Not Detected Final    Comment: This nucleic acid amplification test was developed and its performance characteristics determined by [employer]. Nucleic acid amplification tests include RT-PCR and TMA. This test has not been FDA cleared or approved. This test has been authorized by FDA under an Emergency Use Authorization (EUA). This test is only authorized for the duration of time the declaration that circumstances exist justifying the authorization of the emergency use of in vitro diagnostic tests for detection of SARS-CoV-2 virus and/or diagnosis of COVID-19 infection under section 564(b)(1) of the Act, 21 U.S.C. [id number]) (1), unless the authorization is terminated or revoked sooner. When diagnostic testing is negative, the possibility of a false negative result should be considered in the context of a patient's recent exposures and the presence of clinical signs and symptoms consistent with COVID-19. An individual without symptoms of COVID-19 and who is not shedding SARS-CoV-2 virus wo uld expect to have a negative (not detected) result in this  assay.     Labs: CBC: Recent Labs  Lab 02/28/23 1510  WBC 8.6  HGB 13.3  HCT 40.2  MCV 94.8  PLT 123456   Basic Metabolic Panel: Recent Labs  Lab 02/28/23 1510 03/01/23 0512 03/02/23 0506  NA 141 138 135  K 3.3* 3.9 4.0  CL 102 108 105  CO2 27 25 25   GLUCOSE 124* 110* 107*  BUN 18 19 14   CREATININE 0.97 0.90 0.84  CALCIUM 9.3 8.5* 8.6*  MG 2.0 2.2 2.1   Liver Function Tests: Recent Labs  Lab 02/28/23 1510  AST 19  ALT 16  ALKPHOS 79  BILITOT 0.5  PROT 7.5  ALBUMIN 3.7   CBG: No results for input(s): "GLUCAP" in the last 168 hours.  Discharge time spent: greater than 30 minutes.  Signed: Oran Rein, MD [facility] 03/02/2023

## 2023-03-03 ENCOUNTER — Telehealth: Payer: Self-pay

## 2023-03-03 ENCOUNTER — Ambulatory Visit (INDEPENDENT_AMBULATORY_CARE_PROVIDER_SITE_OTHER): Payer: BC Managed Care – PPO | Admitting: Family Medicine

## 2023-03-03 ENCOUNTER — Encounter: Payer: Self-pay | Admitting: Family Medicine

## 2023-03-03 ENCOUNTER — Telehealth: Payer: Self-pay | Admitting: Family Medicine

## 2023-03-03 VITALS — BP 136/86 | HR 97 | Temp 98.2°F | Resp 16 | Ht 66.0 in | Wt 265.2 lb

## 2023-03-03 DIAGNOSIS — E876 Hypokalemia: Secondary | ICD-10-CM | POA: Diagnosis not present

## 2023-03-03 DIAGNOSIS — Z09 Encounter for follow-up examination after completed treatment for conditions other than malignant neoplasm: Secondary | ICD-10-CM

## 2023-03-03 DIAGNOSIS — I4891 Unspecified atrial fibrillation: Secondary | ICD-10-CM

## 2023-03-03 DIAGNOSIS — R531 Weakness: Secondary | ICD-10-CM | POA: Diagnosis not present

## 2023-03-03 NOTE — Progress Notes (Signed)
Name: Lori Oliver   MRN: [id number]    DOB: [birth year]   Date:03/03/2023       Progress Note  Chief Complaint  Patient presents with   Hospitalization Follow-up    To discuss all labs     Subjective:   Lori Oliver is a 52 y.o. female, presents to clinic for f/up after hospitalization - she is feeling weak and wants to go over labs - but she states she did not want to come in today   Admit date: 02/28/2023 Discharge date: 03/02/2023 Transition of care was initiated previously by lanette Hamilton on 03/03/2023 and med changes, diagnosis, specialist follow ups and pts symptoms and condition were all reviewed.  Pt was admitted for aFib with RVR, hx of afib on meds prn New medications started per hospitalization include adjusted cardizem 30 mg TID Labs due today are- none   Pt feels extremely tired and weak.  She's had similar sx when on the heart meds which is why she decided previously to have them only prn She has not taken cardizem today She has not taken ASA today HR has been good, not going high or fast and no palpitations, CP, SOB Just generalized fatigue worse with getting here today and with showering     Current Outpatient Medications:    diltiazem (CARDIZEM) 30 MG tablet, Take 1 tablet (30 mg total) by mouth 3 (three) times daily., Disp: 60 tablet, Rfl: 1   Multiple Vitamin (MULTIVITAMIN WITH MINERALS) TABS tablet, Take 1 tablet by mouth daily., Disp: , Rfl:    propranolol (INDERAL) 20 MG tablet, Take 1 tablet (20 mg total) by mouth 3 (three) times daily as needed., Disp: 60 tablet, Rfl: 1   EQ ASPIRIN LOW DOSE 81 MG chewable tablet, , Disp: , Rfl:    levocetirizine (XYZAL) 5 MG tablet, Take 1 tablet (5 mg total) by mouth every evening. (Patient not taking: Reported on 02/28/2023), Disp: 30 tablet, Rfl: 5   predniSONE (DELTASONE) 20 MG tablet, 2 tabs poqday 1-3, 1 tabs poqday 4-6 (Patient not taking: Reported on 02/28/2023), Disp: 9 tablet, Rfl: 0   [START ON  03/30/2023] Semaglutide-Weight Management 2.4 MG/0.75ML SOAJ, Inject 2.4 mg into the skin once a week for 28 days. (Patient not taking: Reported on 02/28/2023), Disp: 3 mL, Rfl: 0  Patient Active Problem List   Diagnosis Date Noted   Upper airway cough syndrome 02/21/2022   Shortness of breath 10/22/2021   Congestive heart failure, unspecified HF chronicity, unspecified heart failure type ([place]) 03/19/2021   Nexplanon in place 01/03/2021   Prediabetes 07/17/2020   Hyperlipidemia 07/17/2020   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   Gastroesophageal reflux disease without esophagitis 07/17/2020   Uterine leiomyoma 07/17/2020   Atrial tachycardia 05/17/2019   Chest tightness 05/17/2019   Intermittent atrial fibrillation ([place]) 02/19/2018   Hypokalemia 02/10/2018   Anxiety 01/13/2018   Atrial fibrillation with RVR ([place]) 12/01/2017   Vitamin D deficiency 07/08/2017   Lower back pain 07/07/2017   Screening for colon cancer 07/07/2017   Class 3 severe obesity due to excess calories with serious comorbidity and body mass index (BMI) of 40.0 to 44.9 in adult ([place]) 07/07/2017   Essential hypertension 07/11/2016   IFG (impaired fasting glucose) 05/16/2016   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 05/16/2016   Abnormal thyroid function test 05/16/2016   Decreased libido 05/16/2016   Breast lump on left side at 5 o'clock position 05/16/2016   Bilateral thoracic back pain 06/01/2015   History of abnormal  cervical Pap smear 06/01/2015    Past Surgical History:  Procedure Laterality Date   BREAST BIOPSY Right 2015   benign   COLONOSCOPY WITH PROPOFOL N/A 02/05/2023   Procedure: COLONOSCOPY WITH PROPOFOL;  Surgeon: Lin Landsman, MD;  Location: [facility] ENDOSCOPY;  Service: Gastroenterology;  Laterality: N/A;   ESOPHAGOGASTRODUODENOSCOPY (EGD) WITH PROPOFOL N/A 08/12/2018   Procedure: ESOPHAGOGASTRODUODENOSCOPY (EGD) WITH PROPOFOL;  Surgeon: Toledo, Benay Pike, MD;  Location: [facility] ENDOSCOPY;  Service:  Gastroenterology;  Laterality: N/A;   HAMMER TOE SURGERY      Family History  Problem Relation Age of Onset   Hypertension Mother    Heart disease Mother    Arthritis Mother    Depression Mother    Stroke Maternal Grandmother     Social History   Tobacco Use   Smoking status: Never   Smokeless tobacco: Never  Vaping Use   Vaping Use: Never used  Substance Use Topics   Alcohol use: Not Currently    Alcohol/week: 0.0 standard drinks of alcohol    Comment: occ   Drug use: No     No Known Allergies  Health Maintenance  Topic Date Due   Zoster Vaccines- Shingrix (1 of 2) 03/05/2023 (Originally 12/20/2021)   INFLUENZA VACCINE  03/16/2023 (Originally 07/16/2022)   COVID-19 Vaccine (4 - 2023-24 season) 03/19/2023 (Originally 08/16/2022)   MAMMOGRAM  05/02/2023   PAP SMEAR-Modifier  12/05/2024   DTaP/Tdap/Td (2 - Td or Tdap) 11/30/2028   COLONOSCOPY (Pts 45-97yrs Insurance coverage will need to be confirmed)  02/05/2033   Hepatitis C Screening  Completed   HIV Screening  Completed   HPV VACCINES  Aged Out   PAP-Cervical Cytology Screening  Discontinued    Chart Review Today: I personally reviewed active problem list, medication list, allergies, family history, social history, health maintenance, notes from last encounter, lab results, imaging with the patient/caregiver today.   Review of Systems  Constitutional: Negative.   HENT: Negative.    Eyes: Negative.   Respiratory: Negative.    Cardiovascular: Negative.   Gastrointestinal: Negative.   Endocrine: Negative.   Genitourinary: Negative.   Musculoskeletal: Negative.   Skin: Negative.   Allergic/Immunologic: Negative.   Neurological: Negative.   Hematological: Negative.   Psychiatric/Behavioral: Negative.    All other systems reviewed and are negative.    Objective:   Vitals:   03/03/23 1449  BP: 136/86  Pulse: 97  Resp: 16  Temp: 98.2 F (36.8 C)  TempSrc: Oral  SpO2: 98%  Weight: 265 lb 3.2 oz (120.3  kg)  Height: 5\' 6"  (1.676 m)    Body mass index is 42.8 kg/m.  Physical Exam Vitals and nursing note reviewed.  Constitutional:      General: She is not in acute distress.    Appearance: Normal appearance. She is well-developed. She is obese. She is not ill-appearing, toxic-appearing or diaphoretic.  HENT:     Head: Normocephalic and atraumatic.     Nose: Nose normal.  Eyes:     General:        Right eye: No discharge.        Left eye: No discharge.     Conjunctiva/sclera: Conjunctivae normal.  Neck:     Trachea: No tracheal deviation.  Cardiovascular:     Rate and Rhythm: Normal rate and regular rhythm.     Pulses: Normal pulses.     Heart sounds: Normal heart sounds. No murmur heard.    No friction rub. No gallop.  Pulmonary:  Effort: Pulmonary effort is normal. No respiratory distress.     Breath sounds: No stridor.  Skin:    General: Skin is warm and dry.     Findings: No rash.  Neurological:     Mental Status: She is alert.     Motor: No abnormal muscle tone.     Coordination: Coordination normal.  Psychiatric:        Behavior: Behavior normal.         Assessment & Plan:     ICD-10-CM   1. Encounter for examination following treatment at hospital  Z09    reviewed her labs with her today, notes, labs and prior hx in chart reviewed today    2. Atrial fibrillation with RVR (HCC)  I48.91    with demand ischemia positive trops trending down, pt was concerned, reviewed labs, physiology, inpt MD note  with pt today    3. Hypokalemia  E87.6    reviewed her labs with her, last check potassium was normal    4. Weakness  R53.1    generalized, may be SE of cardizem or just some slow recovery after hospitalization, heart - RRR, no concerning sx to send back to hospital, pt reassured       Pt to f/up in 1-2 months   [name], PA-C 03/03/23 2:58 PM

## 2023-03-03 NOTE — Telephone Encounter (Unsigned)
Copied from [place] [id number]. Topic: General - Other >> Mar 03, 2023 12:36 PM Dominique A wrote: Reason for CRM: Pt is calling wanting to speak with her PCP regarding her test results that she received  in her mychart from the hospital. Please call pt back.

## 2023-03-03 NOTE — Telephone Encounter (Signed)
Pt needs to do a hospital follow up to discuss

## 2023-03-03 NOTE — Telephone Encounter (Unsigned)
Copied from [place] [id number]. Topic: General - Other >> Mar 03, 2023 12:36 PM Dominique A wrote: Reason for CRM: Pt is calling wanting to speak with her PCP regarding her test results that she received  in her mychart from the hospital. Please call pt back.

## 2023-03-03 NOTE — Transitions of Care (Post Inpatient/ED Visit) (Signed)
   03/03/2023  Name: CORLA SCRITCHFIELD MRN: [id number] DOB: 03/14/1971  Today's TOC FU Call Status: Today's TOC FU Call Status:: Successful TOC FU Call Competed TOC FU Call Complete Date: 03/03/23  Transition Care Management Follow-up Telephone Call Date of Discharge: 03/02/23 Discharge Facility: [facility]) Type of Discharge: Inpatient Admission Primary Inpatient Discharge Diagnosis:: afib How have you been since you were released from the hospital?: Better Any questions or concerns?: No  Items Reviewed: Did you receive and understand the discharge instructions provided?: Yes Medications obtained and verified?: Yes (Medications Reviewed) Any new allergies since your discharge?: No Dietary orders reviewed?: Yes Do you have support at home?: Yes People in Home: child(ren), adult  Home Care and Equipment/Supplies: [place] Ordered?: NA Any new equipment or medical supplies ordered?: NA  Functional Questionnaire: Do you need assistance with bathing/showering or dressing?: No Do you need assistance with meal preparation?: No Do you need assistance with eating?: No Do you have difficulty maintaining continence: No Do you need assistance with getting out of bed/getting out of a chair/moving?: No Do you have difficulty managing or taking your medications?: No  Folllow up appointments reviewed: PCP Follow-up appointment confirmed?: No (declined) MD Provider Line Number:[phone] Given: No Specialist Hospital Follow-up appointment confirmed?: No Reason Specialist Follow-Up Not Confirmed: Patient has Specialist Provider Number and will Call for Appointment Do you need transportation to your follow-up appointment?: No Do you understand care options if your condition(s) worsen?: Yes-patient verbalized understanding    [place], [place] Nurse Health Advisor Direct Dial [phone]

## 2023-03-05 ENCOUNTER — Ambulatory Visit: Payer: BC Managed Care – PPO | Admitting: Family Medicine

## 2023-03-31 ENCOUNTER — Other Ambulatory Visit (HOSPITAL_COMMUNITY)
Admission: RE | Admit: 2023-03-31 | Discharge: 2023-03-31 | Disposition: A | Discharge status: Home / Self Care | Payer: BC Managed Care – PPO | Source: Ambulatory Visit | Attending: Family Medicine | Admitting: Family Medicine

## 2023-03-31 ENCOUNTER — Ambulatory Visit (INDEPENDENT_AMBULATORY_CARE_PROVIDER_SITE_OTHER): Payer: BC Managed Care – PPO | Admitting: Family Medicine

## 2023-03-31 ENCOUNTER — Encounter: Payer: Self-pay | Admitting: Family Medicine

## 2023-03-31 VITALS — BP 148/90 | HR 77 | Temp 97.8°F | Resp 16 | Ht 66.0 in | Wt 269.0 lb

## 2023-03-31 DIAGNOSIS — R350 Frequency of micturition: Secondary | ICD-10-CM

## 2023-03-31 DIAGNOSIS — M545 Low back pain, unspecified: Secondary | ICD-10-CM

## 2023-03-31 DIAGNOSIS — R300 Dysuria: Secondary | ICD-10-CM

## 2023-03-31 DIAGNOSIS — N760 Acute vaginitis: Secondary | ICD-10-CM | POA: Insufficient documentation

## 2023-03-31 DIAGNOSIS — R062 Wheezing: Secondary | ICD-10-CM

## 2023-03-31 LAB — POCT URINALYSIS DIPSTICK
Bilirubin, UA: NEGATIVE
Glucose, UA: NEGATIVE
Ketones, UA: NEGATIVE
Leukocytes, UA: NEGATIVE
Nitrite, UA: NEGATIVE
Protein, UA: NEGATIVE
Spec Grav, UA: 1.02 (ref 1.010–1.025)
Urobilinogen, UA: 0.2 E.U./dL
pH, UA: 5 (ref 5.0–8.0)

## 2023-03-31 MED ORDER — FLUCONAZOLE 150 MG PO TABS: 150.0000 mg | ORAL_TABLET | ORAL | 0 refills | Status: DC | PRN

## 2023-03-31 MED ORDER — BUDESONIDE-FORMOTEROL FUMARATE 160-4.5 MCG/ACT IN AERO: 2.0000 | INHALATION_SPRAY | Freq: Two times a day (BID) | RESPIRATORY_TRACT | 12 refills | Status: DC

## 2023-03-31 MED ORDER — CEPHALEXIN 500 MG PO CAPS: 500.0000 mg | ORAL_CAPSULE | Freq: Two times a day (BID) | ORAL | 0 refills | Status: AC

## 2023-03-31 NOTE — Progress Notes (Signed)
Patient ID: Lori Oliver, female    DOB: 11-21-1971, 52 y.o.   MRN: [id number]  PCP: Danelle Berry, PA-C  Chief Complaint  Patient presents with   Back Pain    Low back right side, onset for a week   Urinary Frequency   Dysuria    A little burning    Subjective:   Lori Oliver is a 52 y.o. female, presents to clinic with CC of the following:  HPI  Patient presents with over a week of lower abdominal/suprapubic discomfort followed by urinary frequency urgency and then dysuria.  She also reports having some excessive moisture or wetness in her undergarments she is not sure if she is having vaginal discharge she does have some irritation.    She reports not being sexually active currently and denies any STD exposure  Reviewed her chart she previously saw Dr. Valentino Saxon for similar symptoms about 6 to 7 months ago and was treated for a yeast infection Per chart review there is no recent urine cultures positive and no history of recurrent UTI  She also notes hearing wheeze but not feeling congestion or SOB She does see cardiology, she refuses pulm consult, does have a rescue inhaler, hasn't ever had bronchitis or asthma before   Results for orders placed or performed in visit on 03/31/23  POCT urinalysis dipstick  Result Value Ref Range   Color, UA Dark yellow    Clarity, UA Cloudy    Glucose, UA Negative Negative   Bilirubin, UA Negative    Ketones, UA Negative    Spec Grav, UA 1.020 1.010 - 1.025   Blood, UA Trace    pH, UA 5.0 5.0 - 8.0   Protein, UA Negative Negative   Urobilinogen, UA 0.2 0.2 or 1.0 E.U./dL   Nitrite, UA Negative    Leukocytes, UA Negative Negative   Appearance Dark yellow    Odor foul       Patient Active Problem List   Diagnosis Date Noted   Upper airway cough syndrome 02/21/2022   Shortness of breath 10/22/2021   Congestive heart failure, unspecified HF chronicity, unspecified heart failure type 03/19/2021   Nexplanon in place  01/03/2021   Prediabetes 07/17/2020   Hyperlipidemia 07/17/2020   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   Gastroesophageal reflux disease without esophagitis 07/17/2020   Uterine leiomyoma 07/17/2020   Atrial tachycardia 05/17/2019   Chest tightness 05/17/2019   Intermittent atrial fibrillation 02/19/2018   Hypokalemia 02/10/2018   Anxiety 01/13/2018   Atrial fibrillation with RVR 12/01/2017   Vitamin D deficiency 07/08/2017   Lower back pain 07/07/2017   Screening for colon cancer 07/07/2017   Class 3 severe obesity due to excess calories with serious comorbidity and body mass index (BMI) of 40.0 to 44.9 in adult 07/07/2017   Essential hypertension 07/11/2016   IFG (impaired fasting glucose) 05/16/2016   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 05/16/2016   Abnormal thyroid function test 05/16/2016   Decreased libido 05/16/2016   Breast lump on left side at 5 o'clock position 05/16/2016   Bilateral thoracic back pain 06/01/2015   History of abnormal cervical Pap smear 06/01/2015      Current Outpatient Medications:    diltiazem (CARDIZEM) 30 MG tablet, Take 1 tablet (30 mg total) by mouth 3 (three) times daily., Disp: 60 tablet, Rfl: 1   Multiple Vitamin (MULTIVITAMIN WITH MINERALS) TABS tablet, Take 1 tablet by mouth daily., Disp: , Rfl:    propranolol (INDERAL) 20 MG tablet, Take 1 tablet (  20 mg total) by mouth 3 (three) times daily as needed., Disp: 60 tablet, Rfl: 1   EQ ASPIRIN LOW DOSE 81 MG chewable tablet, , Disp: , Rfl:    levocetirizine (XYZAL) 5 MG tablet, Take 1 tablet (5 mg total) by mouth every evening. (Patient not taking: Reported on 02/28/2023), Disp: 30 tablet, Rfl: 5   predniSONE (DELTASONE) 20 MG tablet, 2 tabs poqday 1-3, 1 tabs poqday 4-6 (Patient not taking: Reported on 02/28/2023), Disp: 9 tablet, Rfl: 0   Semaglutide-Weight Management 2.4 MG/0.75ML SOAJ, Inject 2.4 mg into the skin once a week for 28 days. (Patient not taking: Reported on 02/28/2023), Disp: 3 mL, Rfl: 0   No  Known Allergies   Social History   Tobacco Use   Smoking status: Never   Smokeless tobacco: Never  Vaping Use   Vaping Use: Never used  Substance Use Topics   Alcohol use: Not Currently    Alcohol/week: 0.0 standard drinks of alcohol    Comment: occ   Drug use: No      Chart Review Today: I personally reviewed active problem list, medication list, allergies, family history, social history, health maintenance, notes from last encounter, lab results, imaging with the patient/caregiver today.   Review of Systems  Constitutional: Negative.   HENT: Negative.    Eyes: Negative.   Respiratory: Negative.    Cardiovascular: Negative.   Gastrointestinal: Negative.   Endocrine: Negative.   Genitourinary: Negative.   Musculoskeletal: Negative.   Skin: Negative.   Allergic/Immunologic: Negative.   Neurological: Negative.   Hematological: Negative.   Psychiatric/Behavioral: Negative.    All other systems reviewed and are negative.      Objective:   Vitals:   03/31/23 1456  BP: (!) 148/90  Pulse: 77  Resp: 16  Temp: 97.8 F (36.6 C)  TempSrc: Oral  SpO2: 99%  Weight: 269 lb (122 kg)  Height:  (1.676 m)    Body mass index is 43.42 kg/m.  Physical Exam Vitals and nursing note reviewed.  Constitutional:      General: She is not in acute distress.    Appearance: Normal appearance. She is well-developed. She is obese. She is not ill-appearing, toxic-appearing or diaphoretic.  HENT:     Head: Normocephalic and atraumatic.     Nose: Nose normal.  Eyes:     General:        Right eye: No discharge.        Left eye: No discharge.     Conjunctiva/sclera: Conjunctivae normal.  Neck:     Trachea: No tracheal deviation.  Cardiovascular:     Rate and Rhythm: Normal rate and regular rhythm.     Pulses: Normal pulses.     Heart sounds: Normal heart sounds.  Pulmonary:     Effort: Pulmonary effort is normal. No respiratory distress.     Breath sounds: No stridor.  Wheezing present. No rhonchi or rales.  Abdominal:     General: Bowel sounds are normal. There is no distension.     Palpations: Abdomen is soft.     Tenderness: There is no abdominal tenderness. There is no right CVA tenderness, left CVA tenderness, guarding or rebound.  Musculoskeletal:        General: Normal range of motion.  Skin:    General: Skin is warm and dry.     Findings: No rash.  Neurological:     Mental Status: She is alert.     Motor: No abnormal muscle tone.  Coordination: Coordination normal.  Psychiatric:        Behavior: Behavior normal.      Results for orders placed or performed in visit on 03/31/23  POCT urinalysis dipstick  Result Value Ref Range   Color, UA Dark yellow    Clarity, UA Cloudy    Glucose, UA Negative Negative   Bilirubin, UA Negative    Ketones, UA Negative    Spec Grav, UA 1.020 1.010 - 1.025   Blood, UA Trace    pH, UA 5.0 5.0 - 8.0   Protein, UA Negative Negative   Urobilinogen, UA 0.2 0.2 or 1.0 E.U./dL   Nitrite, UA Negative    Leukocytes, UA Negative Negative   Appearance Dark yellow    Odor foul        Assessment & Plan:   1. Right low back pain, unspecified chronicity, unspecified whether sciatica present - not mentioned during out encounter today - only low abd pain and urinary and GU sx - POCT urinalysis dipstick  2. Urinary frequency Dip with trace blood - hx consistent with cystitis but will also check for yeast vaginitis - cover for both per pt and follow lab results and culture - POCT urinalysis dipstick - Urine Culture - cephALEXin (KEFLEX) 500 MG capsule; Take 1 capsule (500 mg total) by mouth 2 (two) times daily for 5 days.  Dispense: 10 capsule; Refill: 0  3. Dysuria Tx empirically for UTI - Urine Culture - cephALEXin (KEFLEX) 500 MG capsule; Take 1 capsule (500 mg total) by mouth 2 (two) times daily for 5 days.  Dispense: 10 capsule; Refill: 0  4. Acute vaginitis Prior hx of yeast vaginitis presenting  with similar sx - diflucan sent in - fluconazole (DIFLUCAN) 150 MG tablet; Take 1 tablet (150 mg total) by mouth every 3 (three) days as needed (for vaginal itching/yeast infection sx).  Dispense: 2 tablet; Refill: 0 - Cervicovaginal ancillary only  5. Wheeze Explained that Pfts or pulm consult would be helpful, she has expiratory wheeze but denies shortness of breath cough congestion She has a rescue inhaler but would like to try a maintenance inhaler She also is going to ask cardiology about cardiac etiologies of wheeze - budesonide-formoterol (SYMBICORT) 160-4.5 MCG/ACT inhaler; Inhale 2 puffs into the lungs in the morning and at bedtime.  Dispense: 1 each; Refill: [address]       Danelle Berry, PA-C 03/31/23 3:12 PM

## 2023-04-01 LAB — URINE CULTURE
MICRO NUMBER:: 14826262
SPECIMEN QUALITY:: ADEQUATE

## 2023-04-02 LAB — CERVICOVAGINAL ANCILLARY ONLY
Bacterial Vaginitis (gardnerella): NEGATIVE
Candida Glabrata: NEGATIVE
Candida Vaginitis: NEGATIVE
Chlamydia: NEGATIVE
Comment: NEGATIVE
Comment: NEGATIVE
Comment: NEGATIVE
Comment: NEGATIVE
Comment: NEGATIVE
Comment: NORMAL
Neisseria Gonorrhea: NEGATIVE
Trichomonas: NEGATIVE

## 2023-04-04 ENCOUNTER — Ambulatory Visit: Payer: BC Managed Care – PPO | Attending: Medical | Admitting: Medical

## 2023-04-04 ENCOUNTER — Encounter: Payer: Self-pay | Admitting: Medical

## 2023-04-04 VITALS — BP 147/82 | HR 73 | Ht 66.0 in | Wt 270.0 lb

## 2023-04-04 DIAGNOSIS — I4891 Unspecified atrial fibrillation: Secondary | ICD-10-CM | POA: Diagnosis not present

## 2023-04-04 DIAGNOSIS — I10 Essential (primary) hypertension: Secondary | ICD-10-CM | POA: Diagnosis not present

## 2023-04-04 MED ORDER — APIXABAN 5 MG PO TABS: 5.0000 mg | ORAL_TABLET | Freq: Two times a day (BID) | ORAL | 3 refills | Status: DC

## 2023-04-04 NOTE — Patient Instructions (Signed)
Medication Instructions:  Your physician has recommended you make the following change in your medication:  START - apixaban (ELIQUIS) 5 MG TABS tablet - Take 1 tablet by mouth 2 times daily  *If you need a refill on your cardiac medications before your next appointment, please call your pharmacy*  Lab Work: Your physician recommends that you return for lab work in 2 weeks for: CBC  Medical Mall Entrance at [facility] 1st desk on the right to check in (REGISTRATION)  Lab hours: Monday- Friday (7:30 am- 5:30 pm)  If you have labs (blood work) drawn today and your tests are completely normal, you will receive your results only by: MyChart Message (if you have MyChart) OR A paper copy in the mail If you have any lab test that is abnormal or we need to change your treatment, we will call you to review the results.  Testing/Procedures: -None ordered  Follow-Up: At [facility], you and your health needs are our priority.  As part of our continuing mission to provide you with exceptional heart care, we have created designated Provider Care Teams.  These Care Teams include your primary Cardiologist (physician) and Advanced Practice Providers (APPs -  Physician Assistants and Nurse Practitioners) who all work together to provide you with the care you need, when you need it.  We recommend signing up for the patient portal called "MyChart".  Sign up information is provided on this After Visit Summary.  MyChart is used to connect with patients for Virtual Visits (Telemedicine).  Patients are able to view lab/test results, encounter notes, upcoming appointments, etc.  Non-urgent messages can be sent to your provider as well.   To learn more about what you can do with MyChart, go to [email or web address].    Your next appointment:   1 year(s)  Provider:   You may see Julien Nordmann, MD or one of the following Advanced Practice Providers on your designated Care Team:   Nicolasa Ducking,  NP Eula Listen, PA-C Cadence Fransico Michael, PA-C Charlsie Quest, NP    Other Instructions -None

## 2023-04-04 NOTE — Progress Notes (Signed)
Cardiology Office Note:    Date:  04/04/2023   ID:  [name], DOB 08-Jul-1971, MRN [id number]  PCP:  Danelle Berry, PA-C  [facility] Cardiologist:  None  [facility] Electrophysiologist:  None   Referring MD: Danelle Berry, PA-C   Chief Complaint: 1 year follow-up/hospital follow-up  History of Present Illness:    Lori Oliver is a 52 y.o. female with a hx of anxiety, A-fib, hypertension, morbid obesity who presents for hospital follow-up.  The patient had afib in 11/2017 after taking Sudafed. She was admitted in March 2020 with Afib and converted to NSR on diltiazem.   Prior event monitor in 2020 showed no Afib, it showed 11 runs of SVT and rare ectopy.  The patient was admitted in March 2024 with Afib RVR started on IV dilt with conversion to NSR. CHADSVASC of 2. She is not on a/c. Echo showed LVEF 60-65%, no WMA, normal RVSF, mild MR.  Today, the patient reports she didn't want to take a blood thinner in the past. She has been doing well since being out of the hospital. EKG shows NSR. She doesn't want to take a daily heart rate medication. No further recurrent Afib. No lower leg edema. She is back at work. No chest pain or shortness of breath. She is not wanting to repeat a heart monitor. She may be willing to try a blood thinner.   Past Medical History:  Diagnosis Date   Abnormal thyroid function test 05/16/2016   Anxiety    Atrial fibrillation    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 05/16/2016   Congestive heart failure, unspecified HF chronicity, unspecified heart failure type 03/19/2021   Decreased libido 05/16/2016   Dysrhythmia    Gastroesophageal reflux disease without esophagitis 07/17/2020   Hyperlipidemia 07/17/2020   Hypertension    Hypokalemia    Impaired fasting glucose    Morbid obesity 06/01/2015   Morbid obesity    Plantar fasciitis    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    Past Surgical History:  Procedure Laterality Date   BREAST BIOPSY Right 2015   benign    COLONOSCOPY WITH PROPOFOL N/A 02/05/2023   Procedure: COLONOSCOPY WITH PROPOFOL;  Surgeon: Toney Reil, MD;  Location: [facility] ENDOSCOPY;  Service: Gastroenterology;  Laterality: N/A;   ESOPHAGOGASTRODUODENOSCOPY (EGD) WITH PROPOFOL N/A 08/12/2018   Procedure: ESOPHAGOGASTRODUODENOSCOPY (EGD) WITH PROPOFOL;  Surgeon: Toledo, Boykin Nearing, MD;  Location: [facility] ENDOSCOPY;  Service: Gastroenterology;  Laterality: N/A;   HAMMER TOE SURGERY      Current Medications: Current Meds  Medication Sig   apixaban (ELIQUIS) 5 MG TABS tablet Take 1 tablet (5 mg total) by mouth 2 (two) times daily.   budesonide-formoterol (SYMBICORT) 160-4.5 MCG/ACT inhaler Inhale 2 puffs into the lungs in the morning and at bedtime.   diltiazem (CARDIZEM) 30 MG tablet Take 1 tablet (30 mg total) by mouth 3 (three) times daily.   EQ ASPIRIN LOW DOSE 81 MG chewable tablet      Allergies:   Patient has no known allergies.   Social History   Socioeconomic History   Marital status: Widowed    Spouse name: Aurther Loft   Number of children: 1   Years of education: 12   Highest education level: Some college, no degree  Occupational History   Not on file  Tobacco Use   Smoking status: Never   Smokeless tobacco: Never  Vaping Use   Vaping Use: Never used  Substance and Sexual Activity   Alcohol use: Not Currently  Alcohol/week: 0.0 standard drinks of alcohol    Comment: occ   Drug use: No   Sexual activity: Yes    Partners: Male    Birth control/protection: Abstinence  Other Topics Concern   Not on file  Social History Narrative   Husband was diagnosed with Cancer this past Summer.   Social Determinants of Health   Financial Resource Strain: Low Risk  (12/04/2022)   Overall Financial Resource Strain (CARDIA)    Difficulty of Paying Living Expenses: Not hard at all  Food Insecurity: No Food Insecurity (02/28/2023)   Hunger Vital Sign    Worried About Running Out of Food in the Last Year: Never true    Ran Out  of Food in the Last Year: Never true  Transportation Needs: No Transportation Needs (02/28/2023)   PRAPARE - Administrator, Civil Service (Medical): No    Lack of Transportation (Non-Medical): No  Physical Activity: Insufficiently Active (12/04/2022)   Exercise Vital Sign    Days of Exercise per Week: 5 days    Minutes of Exercise per Session: 20 min  Stress: No Stress Concern Present (12/04/2022)   [employer] of Occupational Health - Occupational Stress Questionnaire    Feeling of Stress : Only a little  Social Connections: Moderately Integrated (12/04/2022)   Social Connection and Isolation Panel [NHANES]    Frequency of Communication with Friends and Family: More than three times a week    Frequency of Social Gatherings with Friends and Family: More than three times a week    Attends Religious Services: 1 to 4 times per year    Active Member of [employer] or Organizations: Yes    Attends Banker Meetings: 1 to 4 times per year    Marital Status: Widowed     Family History: The patient's family history includes Arthritis in her mother; Depression in her mother; Heart disease in her mother; Hypertension in her mother; Stroke in her maternal grandmother.  ROS:   Please see the history of present illness.     All other systems reviewed and are negative.  EKGs/Labs/Other Studies Reviewed:    The following studies were reviewed today:  Echo 02/2023 1. Left ventricular ejection fraction, by estimation, is 60 to 65%. The  left ventricle has normal function. The left ventricle has no regional  wall motion abnormalities. Left ventricular diastolic parameters were  normal.   2. Right ventricular systolic function is normal. The right ventricular  size is normal. Tricuspid regurgitation signal is inadequate for assessing  PA pressure.   3. The mitral valve is normal in structure. Mild mitral valve  regurgitation. No evidence of mitral stenosis.   4. The  aortic valve is tricuspid. Aortic valve regurgitation is not  visualized. No aortic stenosis is present.   5. The inferior vena cava is normal in size with greater than 50%  respiratory variability, suggesting right atrial pressure of 3 mmHg.   6. Rhythm is normal sinus   Echo 02/2019 1. The left ventricle has normal systolic function with an ejection  fraction of 60-65%. The cavity size was normal. There is mild to  moderately increased left ventricular wall thickness. Left ventricular  diastolic Doppler parameters are indeterminate.   2. The right ventricle has normal systolic function. The cavity was  normal. There is no increase in right ventricular wall thickness.   3. Rhythm is atrial fibrillation   EKG:  EKG is ordered today.  The ekg ordered today demonstrates NSR  70bpm, no significant St/T wave changes  Recent Labs: 02/28/2023: ALT 16; B Natriuretic Peptide 33.0; Hemoglobin 13.3; Platelets 274; TSH 2.702 03/02/2023: BUN 14; Creatinine, Ser 0.84; Magnesium 2.1; Potassium 4.0; Sodium 135  Recent Lipid Panel    Component Value Date/Time   CHOL 173 12/04/2022 1555   CHOL 177 07/07/2017 1543   CHOL 165 03/09/2014 1729   TRIG 92 12/04/2022 1555   TRIG 96 03/09/2014 1729   HDL 54 12/04/2022 1555   HDL 49 07/07/2017 1543   HDL 60 03/09/2014 1729   CHOLHDL 3.2 12/04/2022 1555   VLDL 19 03/09/2014 1729   LDLCALC 100 (H) 12/04/2022 1555   LDLCALC 86 03/09/2014 1729    Physical Exam:    VS:  BP (!) 147/82   Pulse 73   Ht  (1.676 m)   Wt 270 lb (122.5 kg)   LMP 04/15/2020 (Within Days) Comment: Dec 2-5, 2021 spotting really light  SpO2 98%   BMI 43.58 kg/m     Wt Readings from Last 3 Encounters:  04/04/23 270 lb (122.5 kg)  03/31/23 269 lb (122 kg)  03/03/23 265 lb 3.2 oz (120.3 kg)     GEN:  Well nourished, well developed in no acute distress HEENT: Normal NECK: No JVD; No carotid bruits LYMPHATICS: No lymphadenopathy CARDIAC: RRR, no murmurs, rubs,  gallops RESPIRATORY:  Clear to auscultation without rales, wheezing or rhonchi  ABDOMEN: Soft, non-tender, non-distended MUSCULOSKELETAL:  No edema; No deformity  SKIN: Warm and dry NEUROLOGIC:  Alert and oriented x 3 PSYCHIATRIC:  Normal affect   ASSESSMENT:    1. Atrial fibrillation with RVR   2. Essential hypertension    PLAN:    In order of problems listed above:  Paroxysmal Afib Patient with recent admission for afib RVR converted with IV dilt. Echo showed normal LVEF. Today, EKG shows NSR. She has had prior afib episodes and has refused blood thinner in the past. CHADSVASC of 2. She is willing to try a blood thinner, so I will send in Eliquis  BID. I will check a CBC in 2 weeks. She is not wanting to do a repeat heart monitor. She is on diltiazem and propranolol to use only as needed for palpitations. She is not wanting to be on a daily rate control medications.   HTN BP is mildly elevated. She is not wanting a daily medication if it's not necessary.  Disposition: Follow up in 1 year(s) with MD/APP    Signed, Shenicka Sunderlin David Stall, PA-C  04/04/2023 4:28 PM    [facility]

## 2023-05-05 ENCOUNTER — Ambulatory Visit (INDEPENDENT_AMBULATORY_CARE_PROVIDER_SITE_OTHER): Payer: BC Managed Care – PPO | Admitting: Family Medicine

## 2023-05-05 ENCOUNTER — Other Ambulatory Visit: Payer: Self-pay | Admitting: Obstetrics and Gynecology

## 2023-05-05 ENCOUNTER — Telehealth: Payer: Self-pay

## 2023-05-05 ENCOUNTER — Encounter: Payer: Self-pay | Admitting: Family Medicine

## 2023-05-05 VITALS — BP 136/84 | HR 98 | Temp 97.7°F | Resp 18 | Ht 66.0 in | Wt 272.6 lb

## 2023-05-05 DIAGNOSIS — R0602 Shortness of breath: Secondary | ICD-10-CM

## 2023-05-05 DIAGNOSIS — J069 Acute upper respiratory infection, unspecified: Secondary | ICD-10-CM | POA: Diagnosis not present

## 2023-05-05 DIAGNOSIS — K219 Gastro-esophageal reflux disease without esophagitis: Secondary | ICD-10-CM | POA: Diagnosis not present

## 2023-05-05 DIAGNOSIS — J40 Bronchitis, not specified as acute or chronic: Secondary | ICD-10-CM | POA: Diagnosis not present

## 2023-05-05 DIAGNOSIS — R052 Subacute cough: Secondary | ICD-10-CM

## 2023-05-05 DIAGNOSIS — R059 Cough, unspecified: Secondary | ICD-10-CM

## 2023-05-05 DIAGNOSIS — N950 Postmenopausal bleeding: Secondary | ICD-10-CM

## 2023-05-05 DIAGNOSIS — R062 Wheezing: Secondary | ICD-10-CM | POA: Diagnosis not present

## 2023-05-05 MED ORDER — TRELEGY ELLIPTA 100-62.5-25 MCG/ACT IN AEPB: 1.0000 | INHALATION_SPRAY | Freq: Every day | RESPIRATORY_TRACT | 2 refills | Status: AC

## 2023-05-05 MED ORDER — PANTOPRAZOLE SODIUM 40 MG PO TBEC: 40.0000 mg | DELAYED_RELEASE_TABLET | Freq: Every day | ORAL | 2 refills | Status: DC

## 2023-05-05 MED ORDER — MEDROXYPROGESTERONE ACETATE 10 MG PO TABS: 10.0000 mg | ORAL_TABLET | Freq: Every day | ORAL | 2 refills | Status: DC

## 2023-05-05 MED ORDER — ALBUTEROL SULFATE HFA 108 (90 BASE) MCG/ACT IN AERS: 2.0000 | INHALATION_SPRAY | Freq: Four times a day (QID) | RESPIRATORY_TRACT | 0 refills | Status: AC | PRN

## 2023-05-05 MED ORDER — ALBUTEROL SULFATE (2.5 MG/3ML) 0.083% IN NEBU
2.5000 mg | INHALATION_SOLUTION | Freq: Once | RESPIRATORY_TRACT | Status: AC
  Administered 2023-05-05: 2.5 mg via RESPIRATORY_TRACT

## 2023-05-05 MED ORDER — PREDNISONE 20 MG PO TABS: 40.0000 mg | ORAL_TABLET | Freq: Every day | ORAL | 0 refills | Status: DC

## 2023-05-05 NOTE — Progress Notes (Signed)
Patient ID: Lori Oliver, female    DOB: 1971/06/02, 52 y.o.   MRN: [id number]  PCP: Danelle Berry, PA-C  Chief Complaint  Patient presents with   Cough    Chest congestion   Wheezing   Shortness of Breath    Onset for 3 weeks    Subjective:   Lori Oliver is a 52 y.o. female, presents to clinic with CC of the following:  HPI  Here for cough/congestion with wheeze and some vomiting/diarrhea Hx of CHF, upper airway cough syndrome, SOB,   Given symbicort at last appt when seen for a different problem but was noted to be wheezy At that time she refused a pulmonology consult, no prior PFT's, no past dx of COPD or asthma  03/31/2023 OV: 5. Wheeze Explained that Pfts or pulm consult would be helpful, she has expiratory wheeze but denies shortness of breath cough congestion She has a rescue inhaler but would like to try a maintenance inhaler She also is going to ask cardiology about cardiac etiologies of wheeze - budesonide-formoterol (SYMBICORT) 160-4.5 MCG/ACT inhaler; Inhale 2 puffs into the lungs in the morning and at bedtime.  Dispense: 1 each; Refill: 12   Referral in 11/2021  She states she wants to know why she continues to be wheezy and short of breath   Patient Active Problem List   Diagnosis Date Noted   Upper airway cough syndrome 02/21/2022   Shortness of breath 10/22/2021   Congestive heart failure, unspecified HF chronicity, unspecified heart failure type (HCC) 03/19/2021   Nexplanon in place 01/03/2021   Prediabetes 07/17/2020   Hyperlipidemia 07/17/2020   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   Gastroesophageal reflux disease without esophagitis 07/17/2020   Uterine leiomyoma 07/17/2020   Atrial tachycardia 05/17/2019   Chest tightness 05/17/2019   Intermittent atrial fibrillation (HCC) 02/19/2018   Hypokalemia 02/10/2018   Anxiety 01/13/2018   Atrial fibrillation with RVR (HCC) 12/01/2017   Vitamin D deficiency 07/08/2017   Lower back pain  07/07/2017   Screening for colon cancer 07/07/2017   Class 3 severe obesity due to excess calories with serious comorbidity and body mass index (BMI) of 40.0 to 44.9 in adult (HCC) 07/07/2017   Essential hypertension 07/11/2016   IFG (impaired fasting glucose) 05/16/2016   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 05/16/2016   Abnormal thyroid function test 05/16/2016   Decreased libido 05/16/2016   Breast lump on left side at 5 o'clock position 05/16/2016   Bilateral thoracic back pain 06/01/2015   History of abnormal cervical Pap smear 06/01/2015      Current Outpatient Medications:    apixaban (ELIQUIS) 5 MG TABS tablet, Take 1 tablet (5 mg total) by mouth 2 (two) times daily., Disp: 180 tablet, Rfl: 3   budesonide-formoterol (SYMBICORT) 160-4.5 MCG/ACT inhaler, Inhale 2 puffs into the lungs in the morning and at bedtime., Disp: 1 each, Rfl: 12   diltiazem (CARDIZEM) 30 MG tablet, Take 1 tablet (30 mg total) by mouth 3 (three) times daily., Disp: 60 tablet, Rfl: 1   EQ ASPIRIN LOW DOSE 81 MG chewable tablet, , Disp: , Rfl:    propranolol (INDERAL) 20 MG tablet, Take 1 tablet (20 mg total) by mouth 3 (three) times daily as needed. (Patient not taking: Reported on 04/04/2023), Disp: 60 tablet, Rfl: 1   No Known Allergies   Social History   Tobacco Use   Smoking status: Never   Smokeless tobacco: Never  Vaping Use   Vaping Use: Never used  Substance Use  Topics   Alcohol use: Not Currently    Alcohol/week: 0.0 standard drinks of alcohol    Comment: occ   Drug use: No      Chart Review Today: I personally reviewed active problem list, medication list, allergies, family history, social history, health maintenance, notes from last encounter, lab results, imaging with the patient/caregiver today.   Review of Systems  Constitutional: Negative.   HENT: Negative.    Eyes: Negative.   Respiratory: Negative.    Cardiovascular: Negative.   Gastrointestinal: Negative.   Endocrine: Negative.    Genitourinary: Negative.   Musculoskeletal: Negative.   Skin: Negative.   Allergic/Immunologic: Negative.   Neurological: Negative.   Hematological: Negative.   Psychiatric/Behavioral: Negative.    All other systems reviewed and are negative.      Objective:   Vitals:   05/05/23 1548  BP: 136/84  Pulse: 98  Resp: 18  Temp: 97.7 F (36.5 C)  TempSrc: Oral  SpO2: 98%  Weight: 272 lb 9.6 oz (123.7 kg)  Height: 5\' 6"  (1.676 m)    Body mass index is 44 kg/m.  Physical Exam Vitals and nursing note reviewed.  Constitutional:      General: She is not in acute distress.    Appearance: She is well-developed. She is obese. She is not toxic-appearing or diaphoretic.  HENT:     Head: Normocephalic and atraumatic.     Nose: Nose normal.     Mouth/Throat:     Mouth: Mucous membranes are moist.     Pharynx: Oropharynx is clear.  Eyes:     General: No scleral icterus.       Right eye: No discharge.        Left eye: No discharge.     Conjunctiva/sclera: Conjunctivae normal.  Neck:     Trachea: No tracheal deviation.  Cardiovascular:     Rate and Rhythm: Normal rate and regular rhythm.     Pulses: Normal pulses.     Heart sounds: Normal heart sounds.  Pulmonary:     Effort: Pulmonary effort is normal. No tachypnea, accessory muscle usage or respiratory distress.     Breath sounds: No stridor or transmitted upper airway sounds. Decreased breath sounds and wheezing present. No rhonchi or rales.  Skin:    General: Skin is warm and dry.     Coloration: Skin is not jaundiced or pale.     Findings: No rash.  Neurological:     Mental Status: She is alert. Mental status is at baseline.     Motor: No abnormal muscle tone.     Coordination: Coordination normal.  Psychiatric:        Attention and Perception: She is inattentive.        Mood and Affect: Mood is depressed. Mood is not anxious. Affect is angry.        Speech: Speech normal.        Behavior: Behavior is  uncooperative.           Assessment & Plan:   1. Cough, unspecified type Recurrent - see below - Ambulatory referral to Pulmonology - albuterol (PROVENTIL) (2.5 MG/3ML) 0.083% nebulizer solution 2.5 mg  2. Wheeze Multiple OV per year for several years with wheeze/cough/bronchitis She has refused pulm multiple times, I have explained to her that she needs evaluation by the specialist with pulmonary function test etc. I have given her samples she reports past inhalers were too expensive to use and she has never used any maintenance inhalers  she is also never reach out to [place] to let us know this and therefore has not followed medical advice multiple times for the same complaint but she expresses frustration with having the symptoms and not knowing why she is having the symptoms including wheeze, cough bronchitis and shortness of breath - Ambulatory referral to Pulmonology - Fluticasone-Umeclidin-Vilant (TRELEGY ELLIPTA) 100-62.5-25 MCG/ACT AEPB; Inhale 1 puff into the lungs daily.  Dispense: 28 each; Refill: 2 - albuterol (VENTOLIN HFA) 108 (90 Base) MCG/ACT inhaler; Inhale 2 puffs into the lungs every 6 (six) hours as needed for wheezing or shortness of breath.  Dispense: 8 g; Refill: 0 - predniSONE (DELTASONE) 20 MG tablet; Take 2 tablets (40 mg total) by mouth daily.  Dispense: 10 tablet; Refill: 0 - albuterol (PROVENTIL) (2.5 MG/3ML) 0.083% nebulizer solution 2.5 mg  3. Viral URI with cough Viral illness treatment is supportive she can do antihistamines decongestants over-the-counter cough medicines and I have sent in treatments for wheeze and bronchitis and given her inhaler samples - albuterol (PROVENTIL) (2.5 MG/3ML) 0.083% nebulizer solution 2.5 mg  4. Bronchitis Mutliple episodes of bronchitis over the past couple years - see #2 - albuterol (VENTOLIN HFA) 108 (90 Base) MCG/ACT inhaler; Inhale 2 puffs into the lungs every 6 (six) hours as needed for wheezing or shortness of breath.   Dispense: 8 g; Refill: 0 - predniSONE (DELTASONE) 20 MG tablet; Take 2 tablets (40 mg total) by mouth daily.  Dispense: 10 tablet; Refill: 0 - albuterol (PROVENTIL) (2.5 MG/3ML) 0.083% nebulizer solution 2.5 mg  5. Gastroesophageal reflux disease without esophagitis Will cover with strong PPI -explained that sometimes wheeze or cough can be from GERD and it very simple thing to treat especially since she is having some indigestion symptoms - pantoprazole (PROTONIX) 40 MG tablet; Take 1 tablet (40 mg total) by mouth daily.  Dispense: 30 tablet; Refill: 2  6. Shortness of breath Exertional shortness of breath with known cardiac history which is well-controlled she continues to have shortness of breath with exertion, wheeze and cough which is recurrent and seems to be slowly worsening over the past couple years I again recommended pulmonology consult for further evaluation - Ambulatory referral to Pulmonology - Fluticasone-Umeclidin-Vilant (TRELEGY ELLIPTA) 100-62.5-25 MCG/ACT AEPB; Inhale 1 puff into the lungs daily.  Dispense: 28 each; Refill: 2 - albuterol (PROVENTIL) (2.5 MG/3ML) 0.083% nebulizer solution 2.5 mg  7. Subacute cough See #2 - Ambulatory referral to Pulmonology - pantoprazole (PROTONIX) 40 MG tablet; Take 1 tablet (40 mg total) by mouth daily.  Dispense: 30 tablet; Refill: 2 - Fluticasone-Umeclidin-Vilant (TRELEGY ELLIPTA) 100-62.5-25 MCG/ACT AEPB; Inhale 1 puff into the lungs daily.  Dispense: 28 each; Refill: 2 - albuterol (VENTOLIN HFA) 108 (90 Base) MCG/ACT inhaler; Inhale 2 puffs into the lungs every 6 (six) hours as needed for wheezing or shortness of breath.  Dispense: 8 g; Refill: 0 - predniSONE (DELTASONE) 20 MG tablet; Take 2 tablets (40 mg total) by mouth daily.  Dispense: 10 tablet; Refill: 0 - albuterol (PROVENTIL) (2.5 MG/3ML) 0.083% nebulizer solution 2.5 mg    Return if symptoms worsen or fail to improve.   Danelle Berry, PA-C 05/05/23 3:58 PM

## 2023-05-05 NOTE — Patient Instructions (Addendum)
The plan is To take the steroids, start trilogy once daily, use her albuterol rescue inhaler as needed to puffs every 2 to 4 hours for wheeze and shortness of breath  I can try and send in a nebulizer machine and some vials because the breathing treatments may help her when symptoms get really bad   I put the pulmonology referral and she should follow up with them of course she can't afford any of the medication's she should let us know or come back to update [place]   Acute Bronchitis, Adult  Acute bronchitis is when air tubes in the lungs (bronchi) suddenly get swollen. The condition can make it hard for you to breathe. In adults, acute bronchitis usually goes away within 2 weeks. A cough caused by bronchitis may last up to 3 weeks. Smoking, allergies, and asthma can make the condition worse. What are the causes? Germs that cause cold and flu (viruses). The most common cause of this condition is the virus that causes the common cold. Bacteria. Substances that bother (irritate) the lungs, including: Smoke from cigarettes and other types of tobacco. Dust and pollen. Fumes from chemicals, gases, or burned fuel. Indoor or outdoor air pollution. What increases the risk? A weak body's defense system. This is also called the immune system. Any condition that affects your lungs and breathing, such as asthma. What are the signs or symptoms? A cough. Coughing up clear, yellow, or green mucus. Making high-pitched whistling sounds when you breathe, most often when you breathe out (wheezing). Runny or stuffy nose. Having too much mucus in your lungs (chest congestion). Shortness of breath. Body aches. A sore throat. How is this treated? Acute bronchitis may go away over time without treatment. Your doctor may tell you to: Drink more fluids. This will help thin your mucus so it is easier to cough up. Use a device that gets medicine into your lungs (inhaler). Use a vaporizer or a humidifier. These are  machines that add water to the air. This helps with coughing and poor breathing. Take a medicine that thins mucus and helps clear it from your lungs. Take a medicine that prevents or stops coughing. It is not common to take an antibiotic medicine for this condition. Follow these instructions at home:  Take over-the-counter and prescription medicines only as told by your doctor. Use an inhaler, vaporizer, or humidifier as told by your doctor. Take two teaspoons (10 mL) of honey at bedtime. This helps lessen your coughing at night. Drink enough fluid to keep your pee (urine) pale yellow. Do not smoke or use any products that contain nicotine or tobacco. If you need help quitting, ask your doctor. Get a lot of rest. Return to your normal activities when your doctor says that it is safe. Keep all follow-up visits. How is this prevented?  Wash your hands often with soap and water for at least 20 seconds. If you cannot use soap and water, use hand sanitizer. Avoid contact with people who have cold symptoms. Try not to touch your mouth, nose, or eyes with your hands. Avoid breathing in smoke or chemical fumes. Make sure to get the flu shot every year. Contact a doctor if: Your symptoms do not get better in 2 weeks. You have trouble coughing up the mucus. Your cough keeps you awake at night. You have a fever. Get help right away if: You cough up blood. You have chest pain. You have very bad shortness of breath. You faint or keep feeling  like you are going to faint. You have a very bad headache. Your fever or chills get worse. These symptoms may be an emergency. Get help right away. Call your local emergency services (911 in the [place] Do not wait to see if the symptoms will go away. Do not drive yourself to the hospital. Summary Acute bronchitis is when air tubes in the lungs (bronchi) suddenly get swollen. In adults, acute bronchitis usually goes away within 2 weeks. Drink more fluids.  This will help thin your mucus so it is easier to cough up. Take over-the-counter and prescription medicines only as told by your doctor. Contact a doctor if your symptoms do not improve after 2 weeks of treatment. This information is not intended to replace advice given to you by your health care provider. Make sure you discuss any questions you have with your health care provider. Document Revised: 04/04/2021 Document Reviewed: 04/04/2021 Elsevier Patient Education  2023 [employer].

## 2023-05-05 NOTE — Telephone Encounter (Signed)
I will send in a prescription for Provera 10 mg x 10 days. In the meantime, patient can be scheduled for an ultrasound prior to her appointment and labs to assess for reasons why she may be bleeding, as her fibroids should be inactive in menopause.

## 2023-05-05 NOTE — Telephone Encounter (Signed)
Patient contacted office with concerns of vaginal bleeding x 1 week. Patient states that she is currently in menopause and has a history of fibroids, she describes her flow as moderate and states that it is dark in color an at times she has noticed clots. Patient denies symptoms of abdominal pain/cramping or pelvic pain. Patient states that she was advised from our front desk that your earliest appointment was June 12. Patient would like to know in the meantime until she is able to be seen if you would call in a prescription to her pharmacy ( [facility] on [address]) to stop her vaginal bleeding?

## 2023-05-06 ENCOUNTER — Ambulatory Visit
Admission: RE | Admit: 2023-05-06 | Discharge: 2023-05-06 | Disposition: A | Discharge status: Home / Self Care | Payer: BC Managed Care – PPO | Source: Ambulatory Visit | Attending: Family Medicine | Admitting: Family Medicine

## 2023-05-06 DIAGNOSIS — Z1231 Encounter for screening mammogram for malignant neoplasm of breast: Secondary | ICD-10-CM | POA: Diagnosis not present

## 2023-05-06 NOTE — Telephone Encounter (Signed)
Patient was advised of Dr. Chalmers Guest message below. Orders have been placed in chart for labs and ultrasound. Will you reach out to patient and schedule her for these test? Patient request a call back after 3pm .Thank you. KW

## 2023-05-07 NOTE — Telephone Encounter (Signed)
I contacted the patient via phone x2. I left detailed message per Dr. Valentino Saxon, the patient needs ultrasound and labs done prior to visit on 6/12. I  advise the patient to log into my chart to confirm date/ times. If this doesn't work for her scheduled to call back.

## 2023-05-13 ENCOUNTER — Encounter: Payer: Self-pay | Admitting: Family Medicine

## 2023-05-23 ENCOUNTER — Other Ambulatory Visit: Payer: BC Managed Care – PPO

## 2023-05-27 ENCOUNTER — Other Ambulatory Visit: Payer: BC Managed Care – PPO

## 2023-05-28 ENCOUNTER — Ambulatory Visit: Payer: BC Managed Care – PPO | Admitting: Obstetrics and Gynecology

## 2023-05-28 ENCOUNTER — Telehealth: Payer: Self-pay | Admitting: Obstetrics and Gynecology

## 2023-05-28 ENCOUNTER — Other Ambulatory Visit: Payer: BC Managed Care – PPO

## 2023-05-28 DIAGNOSIS — N950 Postmenopausal bleeding: Secondary | ICD-10-CM | POA: Diagnosis not present

## 2023-05-28 NOTE — Telephone Encounter (Signed)
Reached out to pt to let her know that she should keep the lab appt for 05/28/2023 at 3:20.  Dr. Valentino Saxon will need the information from the labs before she sees the pt.  We need to get the [place] and appt with Dr.Cherry rescheduled for after the labs.  Left message for pt to call back to reschedule.

## 2023-05-29 LAB — CBC
Hematocrit: 36.6 % (ref 34.0–46.6)
Hemoglobin: 12 g/dL (ref 11.1–15.9)
MCH: 31.3 pg (ref 26.6–33.0)
MCHC: 32.8 g/dL (ref 31.5–35.7)
MCV: 95 fL (ref 79–97)
Platelets: 269 10*3/uL (ref 150–450)
RBC: 3.84 x10E6/uL (ref 3.77–5.28)
RDW: 12.5 % (ref 11.7–15.4)
WBC: 8 10*3/uL (ref 3.4–10.8)

## 2023-05-29 LAB — FOLLICLE STIMULATING HORMONE: FSH: 59.7 m[IU]/mL

## 2023-05-29 LAB — TSH: TSH: 1.61 u[IU]/mL (ref 0.450–4.500)

## 2023-05-29 NOTE — Telephone Encounter (Signed)
Pt is scheduled for an [place] on June 28 at [facility].  She has a follow up appt. With Dr. Valentino Saxon on July 23.

## 2023-06-13 ENCOUNTER — Telehealth: Payer: Self-pay | Admitting: Obstetrics and Gynecology

## 2023-06-13 ENCOUNTER — Other Ambulatory Visit: Payer: BC Managed Care – PPO

## 2023-06-13 NOTE — Telephone Encounter (Signed)
Reached out to pt to reschedule [place] appt that was scheduled on 06/13/2023 at 3:00.  Pt was not feeling well.  Pt will call back to reschedule.

## 2023-06-24 ENCOUNTER — Ambulatory Visit: Payer: BC Managed Care – PPO | Admitting: Nurse Practitioner

## 2023-07-08 ENCOUNTER — Ambulatory Visit: Payer: BC Managed Care – PPO | Admitting: Obstetrics and Gynecology

## 2023-07-28 ENCOUNTER — Ambulatory Visit: Payer: Self-pay

## 2023-07-28 NOTE — Telephone Encounter (Signed)
Does she need to bring in a urine?

## 2023-07-28 NOTE — Telephone Encounter (Signed)
Chief Complaint: Pain with urination Symptoms: pain with urination, vaginal discharge, vaginal itching Frequency: comes and goes  Pertinent Negatives: Patient denies fever, odor, nasuea, vomiting Disposition: [] ED /[] Urgent Care (no appt availability in office) / [] Appointment(In office/virtual)/ []  [facility]/ [] Home Care/ [] Refused Recommended Disposition /[] [facility]/ [x]  Follow-up with PCP Additional Notes: Patient states she has pain with urination for about a week and a half along with vaginal discharge and itching. Patient stated she can afford to come into the office for a OV right now and would like the provider to send an Rx to [facility] on [address] for treatment. Care advise given. Advised the patient that I would forward the request to the provider but the provider may recommend an appointment is needed for treatment. Patient verbalized understanding.   Summary: pain when urinating & discharge/possible yeast infection   Patient called in and stated she is having a possible yeast infection, pain when urinating and discharge. Please advise and call patient back @ #[phone]     Reason for Disposition  [1] Painful urination AND [2] EITHER frequency or urgency AND [3] has on-call doctor  Answer Assessment - Initial Assessment Questions 1. SEVERITY: "How bad is the pain?"  (e.g., Scale 1-10; mild, moderate, or severe)   - MILD (1-3): complains slightly about urination hurting   - MODERATE (4-7): interferes with normal activities     - SEVERE (8-10): excruciating, unwilling or unable to urinate because of the pain      7/10 2. FREQUENCY: "How many times have you had painful urination today?"      Twice  3. PATTERN: "Is pain present every time you urinate or just sometimes?"      Just sometimes  4. ONSET: "When did the painful urination start?"      About 1.5 weeks ago  5. FEVER: "Do you have a fever?" If Yes, ask: "What is your temperature, how  was it measured, and when did it start?"     No 6. PAST UTI: "Have you had a urine infection before?" If Yes, ask: "When was the last time?" and "What happened that time?"      Yes, I don't remember how long ago it was  7. CAUSE: "What do you think is causing the painful urination?"  (e.g., UTI, scratch, Herpes sore)     UTI and Yeast infection  8. OTHER SYMPTOMS: "Do you have any other symptoms?" (e.g., blood in urine, flank pain, genital sores, urgency, vaginal discharge)     Vaginal discharge, vaginal discharge, urgency  Protocols used: Urination Pain - Female-A-AH

## 2023-07-31 ENCOUNTER — Telehealth: Payer: BC Managed Care – PPO | Admitting: Nurse Practitioner

## 2023-07-31 ENCOUNTER — Ambulatory Visit: Payer: Self-pay

## 2023-07-31 DIAGNOSIS — U071 COVID-19: Secondary | ICD-10-CM | POA: Diagnosis not present

## 2023-07-31 MED ORDER — NIRMATRELVIR/RITONAVIR (PAXLOVID)TABLET
3.0000 | ORAL_TABLET | Freq: Two times a day (BID) | ORAL | 0 refills | Status: AC
Start: 2023-07-31 — End: 2023-08-05

## 2023-07-31 MED ORDER — BENZONATATE 100 MG PO CAPS
100.0000 mg | ORAL_CAPSULE | Freq: Three times a day (TID) | ORAL | 0 refills | Status: DC | PRN
Start: 2023-07-31 — End: 2023-08-26

## 2023-07-31 NOTE — Telephone Encounter (Signed)
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 Patient stated she started having symptoms on Tuesday but thought it was just a common cold. Patient started to feel worse today and was tested. Care advice given and patient was scheduled for a virtual urgent care visit today at 1000. Patient has some high risk factors that require assessment.   Summary: positive for covid/vomiting some   Patient tested positive for covid, with a rapid test at work, tested positive today 07/31/2023 and requesting medication for it, coughing & fatigue and vomiting some yesterday. Her work sent her home due to testing positive and patient is worried about it with having asthma.   Please advise & contact patient back @ # [phone]     Reason for Disposition  [1] HIGH RISK patient AND [2] influenza is widespread in the community AND [3] ONE OR MORE respiratory symptoms: cough, sore throat, runny or stuffy nose  Answer Assessment - Initial Assessment Questions 1. COVID-19 DIAGNOSIS: "How do you know that you have COVID?" (e.g., positive lab test or self-test, diagnosed by doctor or NP/PA, symptoms after exposure).     I took a rapid test at work  2. COVID-19 EXPOSURE: "Was there any known exposure to COVID before the symptoms began?" CDC Definition of close contact: within 6 feet (2 meters) for a total of 15 minutes or more over a 24-hour period.      I work in healthcare so yes. 3. ONSET: "When did the COVID-19 symptoms start?"      Tuesday  4.  WORST SYMPTOM: "What is your worst symptom?" (e.g., cough, fever, shortness of breath, muscle aches)     Cough 5. COUGH: "Do you have a cough?" If Yes, ask: "How bad is the cough?"       Severe 6. FEVER: "Do you have a fever?" If Yes, ask: "What is your temperature, how was it measured, and when did it start?"     No, I have not checked it today but I feel warm 7. RESPIRATORY STATUS: "Describe your breathing?" (e.g., normal; shortness of breath, wheezing, unable to speak)      Wheezing but I have wheezing all the time  8. BETTER-SAME-WORSE: "Are you getting better, staying the same or getting worse compared to yesterday?"  If getting worse, ask, "In what way?"     Worse  9. OTHER SYMPTOMS: "Do you have any other symptoms?"  (e.g., chills, fatigue, headache, loss of smell or taste, muscle pain, sore throat)     Fatigue, cough, vomiting, mild headache, body ache  10. HIGH RISK DISEASE: "Do you have any chronic medical problems?" (e.g., asthma, heart or lung disease, weak immune system, obesity, etc.)       Asthma, A-fib, obesity 11. VACCINE: "Have you had the COVID-19 vaccine?" If Yes, ask: "Which one, how many shots, when did you get it?"       I think I had 2 or 3 of the vaccines  Protocols used: Coronavirus (COVID-19) Diagnosed or Suspected-A-AH

## 2023-07-31 NOTE — Telephone Encounter (Signed)
Spoke with pt and she did a virtual visit earlier today. They did call pt in some medication

## 2023-07-31 NOTE — Progress Notes (Signed)
Virtual Visit Consent   Lori Oliver, you are scheduled for a virtual visit with a [place] provider today. Just as with appointments in the office, your consent must be obtained to participate. Your consent will be active for this visit and any virtual visit you may have with one of our providers in the next 365 days. If you have a MyChart account, a copy of this consent can be sent to you electronically.  As this is a virtual visit, video technology does not allow for your provider to perform a traditional examination. This may limit your provider's ability to fully assess your condition. If your provider identifies any concerns that need to be evaluated in person or the need to arrange testing (such as labs, EKG, etc.), we will make arrangements to do so. Although advances in technology are sophisticated, we cannot ensure that it will always work on either your end or our end. If the connection with a video visit is poor, the visit may have to be switched to a telephone visit. With either a video or telephone visit, we are not always able to ensure that we have a secure connection.  By engaging in this virtual visit, you consent to the provision of healthcare and authorize for your insurance to be billed (if applicable) for the services provided during this visit. Depending on your insurance coverage, you may receive a charge related to this service.  I need to obtain your verbal consent now. Are you willing to proceed with your visit today? Shawndell MAHEK LASPISA has provided verbal consent on 07/31/2023 for a virtual visit (video or telephone). Viviano Simas, FNP  Date: 07/31/2023 9:51 AM  Virtual Visit via Video Note   I, Viviano Simas, connected with  PAIGE YAMASAKI  ([id number], [id number]) on 07/31/23 at 10:00 AM EDT by a video-enabled telemedicine application and verified that I am speaking with the correct person using two identifiers.  Location: Patient: Virtual Visit Location Patient:  Home Provider: Virtual Visit Location Provider: Home Office   I discussed the limitations of evaluation and management by telemedicine and the availability of in person appointments. The patient expressed understanding and agreed to proceed.    History of Present Illness: Lori Oliver is a 52 y.o. who identifies as a female who was assigned female at birth, and is being seen today after testing positive for COVID   She started to have symptoms 1-2 days ago   Started to develop a dry cough yesterday  More fatigue onset today  She is experiencing expiratory wheezing (this is something she now experiences daily)  She has a history of Upper airway cough syndrome dx 02/2022 She has two inhalers that she uses at baseline Typically uses her Albuterol 1-2 times day  She has used Albuterol twice in the past 24 hours  Has had referral to pulmonology in the past but has not been seen   She also has a history of a-fib and was on Eliquis in the past but has not taken it in 3-4 years due to cost of medication  Takes baby aspirin on occasion   She has not had known COVID in the past  She has been vaccinated for COVID (x2) without a recent booster   She denies any history of kidney disease  GFR From March was >60    Problems:  Patient Active Problem List   Diagnosis Date Noted   Upper airway cough syndrome 02/21/2022   Shortness of breath 10/22/2021  Congestive heart failure, unspecified HF chronicity, unspecified heart failure type (HCC) 03/19/2021   Nexplanon in place 01/03/2021   Prediabetes 07/17/2020   Hyperlipidemia 07/17/2020   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   Gastroesophageal reflux disease without esophagitis 07/17/2020   Uterine leiomyoma 07/17/2020   Atrial tachycardia 05/17/2019   Chest tightness 05/17/2019   Intermittent atrial fibrillation (HCC) 02/19/2018   Hypokalemia 02/10/2018   Anxiety 01/13/2018   Atrial fibrillation with RVR (HCC) 12/01/2017   Vitamin D  deficiency 07/08/2017   Lower back pain 07/07/2017   Screening for colon cancer 07/07/2017   Class 3 severe obesity due to excess calories with serious comorbidity and body mass index (BMI) of 40.0 to 44.9 in adult [facility]) 07/07/2017   Essential hypertension 07/11/2016   IFG (impaired fasting glucose) 05/16/2016   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 05/16/2016   Abnormal thyroid function test 05/16/2016   Decreased libido 05/16/2016   Breast lump on left side at 5 o'clock position 05/16/2016   Bilateral thoracic back pain 06/01/2015   History of abnormal cervical Pap smear 06/01/2015    Allergies: No Known Allergies Medications:  Current Outpatient Medications:    medroxyPROGESTERone (PROVERA) 10 MG tablet, Take 1 tablet (10 mg total) by mouth daily. Use for ten days, Disp: 10 tablet, Rfl: 2   albuterol (VENTOLIN HFA) 108 (90 Base) MCG/ACT inhaler, Inhale 2 puffs into the lungs every 6 (six) hours as needed for wheezing or shortness of breath., Disp: 8 g, Rfl: 0   apixaban (ELIQUIS) 5 MG TABS tablet, Take 1 tablet (5 mg total) by mouth 2 (two) times daily., Disp: 180 tablet, Rfl: 3   diltiazem (CARDIZEM) 30 MG tablet, Take 1 tablet (30 mg total) by mouth 3 (three) times daily., Disp: 60 tablet, Rfl: 1   EQ ASPIRIN LOW DOSE 81 MG chewable tablet, , Disp: , Rfl:    Fluticasone-Umeclidin-Vilant (TRELEGY ELLIPTA) 100-62.5-25 MCG/ACT AEPB, Inhale 1 puff into the lungs daily., Disp: 28 each, Rfl: 2   pantoprazole (PROTONIX) 40 MG tablet, Take 1 tablet (40 mg total) by mouth daily., Disp: 30 tablet, Rfl: 2   predniSONE (DELTASONE) 20 MG tablet, Take 2 tablets (40 mg total) by mouth daily., Disp: 10 tablet, Rfl: 0   propranolol (INDERAL) 20 MG tablet, Take 1 tablet (20 mg total) by mouth 3 (three) times daily as needed. (Patient not taking: Reported on 04/04/2023), Disp: 60 tablet, Rfl: 1  Observations/Objective: Patient is well-developed, well-nourished in no acute distress.  Resting comfortably  at home.   Head is normocephalic, atraumatic.  No labored breathing.  Speech is clear and coherent with logical content.  Patient is alert and oriented at baseline.    Assessment and Plan:  1. COVID-19  Take anti-viral with food:   - nirmatrelvir/ritonavir (PAXLOVID) 20 x 150 MG & 10 x 100MG  TABS; Take 3 tablets by mouth 2 (two) times daily for 5 days. (Take nirmatrelvir 150 mg two tablets twice daily for 5 days and ritonavir 100 mg one tablet twice daily for 5 days) Patient GFR is >60  Dispense: 30 tablet; Refill: 0 - benzonatate (TESSALON) 100 MG capsule; Take 1 capsule (100 mg total) by mouth 3 (three) times daily as needed.  Dispense: 30 capsule; Refill: 0    Discussed with patient importance of symptom management, follow up if symptoms persist or worsen  Increase albuterol to every 4-6 hours   Advised high protein foods  Push fluids   Follow up with PCP or UC if symptoms worsen/SOB any difficulty breathing  Discussed isolation guidelines per CDC and work note sent    Follow Up Instructions: I discussed the assessment and treatment plan with the patient. The patient was provided an opportunity to ask questions and all were answered. The patient agreed with the plan and demonstrated an understanding of the instructions.  A copy of instructions were sent to the patient via MyChart unless otherwise noted below.    The patient was advised to call back or seek an in-person evaluation if the symptoms worsen or if the condition fails to improve as anticipated.  Time:  I spent 15 minutes with the patient via telehealth technology discussing the above problems/concerns.    Viviano Simas, FNP

## 2023-07-31 NOTE — Telephone Encounter (Signed)
Pt needs to be seen for evaluation

## 2023-08-26 ENCOUNTER — Ambulatory Visit (INDEPENDENT_AMBULATORY_CARE_PROVIDER_SITE_OTHER): Payer: BC Managed Care – PPO | Admitting: Family Medicine

## 2023-08-26 ENCOUNTER — Encounter: Payer: Self-pay | Admitting: Family Medicine

## 2023-08-26 VITALS — BP 130/84 | HR 97 | Temp 97.5°F | Resp 16 | Ht 66.0 in | Wt 267.7 lb

## 2023-08-26 DIAGNOSIS — J209 Acute bronchitis, unspecified: Secondary | ICD-10-CM | POA: Diagnosis not present

## 2023-08-26 DIAGNOSIS — Z91199 Patient's noncompliance with other medical treatment and regimen due to unspecified reason: Secondary | ICD-10-CM | POA: Diagnosis not present

## 2023-08-26 DIAGNOSIS — Z8709 Personal history of other diseases of the respiratory system: Secondary | ICD-10-CM

## 2023-08-26 DIAGNOSIS — R059 Cough, unspecified: Secondary | ICD-10-CM | POA: Diagnosis not present

## 2023-08-26 DIAGNOSIS — U071 COVID-19: Secondary | ICD-10-CM | POA: Diagnosis not present

## 2023-08-26 DIAGNOSIS — R058 Other specified cough: Secondary | ICD-10-CM

## 2023-08-26 DIAGNOSIS — R062 Wheezing: Secondary | ICD-10-CM

## 2023-08-26 MED ORDER — BENZONATATE 100 MG PO CAPS
100.0000 mg | ORAL_CAPSULE | Freq: Three times a day (TID) | ORAL | 0 refills | Status: DC | PRN
Start: 2023-08-26 — End: 2024-07-05

## 2023-08-26 MED ORDER — NEBULIZER/TUBING/MOUTHPIECE KIT
PACK | 0 refills | Status: DC
Start: 2023-08-26 — End: 2024-04-21

## 2023-08-26 MED ORDER — AZITHROMYCIN 250 MG PO TABS
ORAL_TABLET | ORAL | 0 refills | Status: DC
Start: 2023-08-26 — End: 2024-04-21

## 2023-08-26 MED ORDER — IPRATROPIUM-ALBUTEROL 0.5-2.5 (3) MG/3ML IN SOLN
3.0000 mL | Freq: Three times a day (TID) | RESPIRATORY_TRACT | 0 refills | Status: AC | PRN
Start: 2023-08-26 — End: ?

## 2023-08-26 MED ORDER — PREDNISONE 10 MG (21) PO TBPK
ORAL_TABLET | ORAL | 0 refills | Status: DC
Start: 2023-08-26 — End: 2024-04-21

## 2023-08-26 MED ORDER — DEXAMETHASONE SODIUM PHOSPHATE 10 MG/ML IJ SOLN
10.0000 mg | Freq: Once | INTRAMUSCULAR | Status: AC
Start: 2023-08-26 — End: 2023-08-26
  Administered 2023-08-26: 10 mg via INTRAMUSCULAR

## 2023-08-26 NOTE — Progress Notes (Signed)
Patient ID: [name], female    DOB: [birth year], 52 y.o.   MRN: [id number]  PCP: Danelle Berry, PA-C  Chief Complaint  Patient presents with   Cough    Sx ongoing for over a week consistent coughing up clear phlegm   Nasal Congestion    Subjective:   [place] CRYSTA DESMIDT is a 52 y.o. female, presents to clinic with CC of the following:  HPI  Pt presents for cough and congestion sx  She was covid positive 8/15 and tx with cough meds and paxlovid- the meds helped a little but for several weeks (its been 3.5 weeks since virtual encounter for COVID) she reports worsening cough, congestion, productive cough, wheeze SOB, inhalers only help for a short time.   She has hx of recurrent URI sx/bronchitis and respiratory sx but has repeatedly declined further eval with pulmonology/PFTs  Last OV with me for similar acute sx was 05/05/2023: Here for cough/congestion with wheeze and some vomiting/diarrhea Hx of CHF, upper airway cough syndrome, SOB,    Given symbicort at last appt when seen for a different problem but was noted to be wheezy At that time she refused a pulmonology consult, no prior PFT's, no past dx of COPD or asthma   03/31/2023 OV: 5. Wheeze Explained that Pfts or pulm consult would be helpful, she has expiratory wheeze but denies shortness of breath cough congestion She has a rescue inhaler but would like to try a maintenance inhaler She also is going to ask cardiology about cardiac etiologies of wheeze - budesonide-formoterol (SYMBICORT) 160-4.5 MCG/ACT inhaler; Inhale 2 puffs into the lungs in the morning and at bedtime.  Dispense: 1 each; Refill: 12    Referral in 11/2021   She states she wants to know why she continues to be wheezy and short of breath  A&P: 1. Cough, unspecified type Recurrent - see below - Ambulatory referral to Pulmonology - albuterol (PROVENTIL) (2.5 MG/3ML) 0.083% nebulizer solution 2.5 mg   2. Wheeze Multiple OV per year for several  years with wheeze/cough/bronchitis She has refused pulm multiple times, I have explained to her that she needs evaluation by the specialist with pulmonary function test etc. I have given her samples she reports past inhalers were too expensive to use and she has never used any maintenance inhalers she is also never reach out to [place] to let us know this and therefore has not followed medical advice multiple times for the same complaint but she expresses frustration with having the symptoms and not knowing why she is having the symptoms including wheeze, cough bronchitis and shortness of breath - Ambulatory referral to Pulmonology - Fluticasone-Umeclidin-Vilant (TRELEGY ELLIPTA) 100-62.5-25 MCG/ACT AEPB; Inhale 1 puff into the lungs daily.  Dispense: 28 each; Refill: 2 - albuterol (VENTOLIN HFA) 108 (90 Base) MCG/ACT inhaler; Inhale 2 puffs into the lungs every 6 (six) hours as needed for wheezing or shortness of breath.  Dispense: 8 g; Refill: 0 - predniSONE (DELTASONE) 20 MG tablet; Take 2 tablets (40 mg total) by mouth daily.  Dispense: 10 tablet; Refill: 0 - albuterol (PROVENTIL) (2.5 MG/3ML) 0.083% nebulizer solution 2.5 mg   3. Viral URI with cough Viral illness treatment is supportive she can do antihistamines decongestants over-the-counter cough medicines and I have sent in treatments for wheeze and bronchitis and given her inhaler samples - albuterol (PROVENTIL) (2.5 MG/3ML) 0.083% nebulizer solution 2.5 mg   4. Bronchitis Mutliple episodes of bronchitis over the past couple years - see #2 -  albuterol (VENTOLIN HFA) 108 (90 Base) MCG/ACT inhaler; Inhale 2 puffs into the lungs every 6 (six) hours as needed for wheezing or shortness of breath.  Dispense: 8 g; Refill: 0 - predniSONE (DELTASONE) 20 MG tablet; Take 2 tablets (40 mg total) by mouth daily.  Dispense: 10 tablet; Refill: 0 - albuterol (PROVENTIL) (2.5 MG/3ML) 0.083% nebulizer solution 2.5 mg   5. Gastroesophageal reflux disease without  esophagitis Will cover with strong PPI -explained that sometimes wheeze or cough can be from GERD and it very simple thing to treat especially since she is having some indigestion symptoms - pantoprazole (PROTONIX) 40 MG tablet; Take 1 tablet (40 mg total) by mouth daily.  Dispense: 30 tablet; Refill: 2   6. Shortness of breath Exertional shortness of breath with known cardiac history which is well-controlled she continues to have shortness of breath with exertion, wheeze and cough which is recurrent and seems to be slowly worsening over the past couple years I again recommended pulmonology consult for further evaluation - Ambulatory referral to Pulmonology - Fluticasone-Umeclidin-Vilant (TRELEGY ELLIPTA) 100-62.5-25 MCG/ACT AEPB; Inhale 1 puff into the lungs daily.  Dispense: 28 each; Refill: 2 - albuterol (PROVENTIL) (2.5 MG/3ML) 0.083% nebulizer solution 2.5 mg   7. Subacute cough See #2 - Ambulatory referral to Pulmonology - pantoprazole (PROTONIX) 40 MG tablet; Take 1 tablet (40 mg total) by mouth daily.  Dispense: 30 tablet; Refill: 2 - Fluticasone-Umeclidin-Vilant (TRELEGY ELLIPTA) 100-62.5-25 MCG/ACT AEPB; Inhale 1 puff into the lungs daily.  Dispense: 28 each; Refill: 2 - albuterol (VENTOLIN HFA) 108 (90 Base) MCG/ACT inhaler; Inhale 2 puffs into the lungs every 6 (six) hours as needed for wheezing or shortness of breath.  Dispense: 8 g; Refill: 0 - predniSONE (DELTASONE) 20 MG tablet; Take 2 tablets (40 mg total) by mouth daily.  Dispense: 10 tablet; Refill: 0 - albuterol (PROVENTIL) (2.5 MG/3ML) 0.083% nebulizer solution 2.5 mg  Last OV referral was done, appt scheduled and then cancelled in July Rx for PPI and trelegy given  Prior Pulm consult with Dr. Sherene Sires -  reviewed his prior OV, testing, result note and f/up comments with pt today after she stated it didn't seem like he was doing anything for her.    March 2023: Brief patient profile:  89 ywbf   never smoker NA with Hospice  with onset in early 58s rhinitis spring time sneezing then  09-18-2018 heart GERD then referred to pulmonary clinic in [facility]  02/21/2022 by Della Goo FNP  for cough Wilfred Lacy onset early 11-18-21    Husband passed away 05-19-23  09-18-2020 / baseline wt  130 p last baby born 20        History of Present Illness  02/21/2022  Pulmonary/ 1st office eval/ Sherene Sires / Chemical engineer Complaint  Patient presents with   pulmonary consult      Per Danelle Berry, PA--occ prod cough with clear sputum mainly in the morning and wheezing with laying flat and with exertion.  Dyspnea: ok at home, thinks mask makes it hard to breathe at work Cough: onset p stirs then "feels like something stuck" in throat but does not wake her up/ min mucoid prod Sleep: flat bed props up on pillows  SABA use: prednisone worked the best/ inhalers don't    No obvious day to day or daytime variability or assoc excess/ purulent sputum or mucus plugs or hemoptysis or cp or chest tightness, subjective wheeze or overt sinus or hb symptoms.  sleeping without nocturnal  or early am exacerbation  of respiratory  c/o's or need for noct saba. Also denies any obvious fluctuation of symptoms with weather or environmental changes or other aggravating or alleviating factors except as outlined above    No unusual exposure hx or h/o childhood pna/ asthma or knowledge of premature birth.   Current Allergies, Complete Past Medical History, Past Surgical History, Family History, and Social History were reviewed in [employer] record.   Assessment    Upper airway cough syndrome Onset nov 2022  -   Allergy screen 02/21/2022 >  Eos 0.4 /  IgE  Pending -   02/21/2022 max rx for gerd   Most likely this is upper airway cough syndrome (previously labeled PNDS),  is so named because it's frequently impossible to sort out how much is  CR/sinusitis with freq throat clearing (which can be related to primary GERD)   vs  causing   secondary (" extra esophageal")  GERD from wide swings in gastric pressure that occur with throat clearing, often  promoting self use of mint and menthol lozenges that reduce the lower esophageal sphincter tone and exacerbate the problem further in a cyclical fashion.    These are the same pts (now being labeled as having "irritable larynx syndrome" by some cough centers) who not infrequently have a history of having failed to tolerate ace inhibitors,  dry powder inhalers or biphosphonates or report having atypical/extraesophageal reflux symptoms(gerd documented at [facility] 2019)  that don't respond to standard doses of PPI  and are easily confused as having aecopd or asthma flares by even experienced allergists/ pulmonologists (myself included).    rec start with max rx for gerd/ cyclical cough await allergy screen and >>> also so added 6 day taper off  Prednisone starting at 40 mg per day in case of component of Th-2 driven upper or lower airways inflammation (if cough responds short term only to relapse before return while will on full rx for uacs (as above), then  that would point to allergic rhinitis/ asthma or eos bronchitis as alternative dx)    Also in case this is asthma asked her to discuss avoiding inderol with Dr Mariah Milling and using saba prn- The proper method of use, as well as anticipated side effects, of a metered-dose inhaler were discussed and demonstrated to the patient using teach back method.   Results of labs were sent to pt:  Nyoka Cowden, MD 02/24/2022  9:20 AM EDT     Call patient :  Study is c/w mild allergy though not specific so no change rx for now but Be sure patient has/keeps f/u ov so we can go over all the details of this study and get a plan together moving forward - ok to move up f/u if not feeling better and wants to be seen sooner    Rosaland Lao, [facility] 02/25/2022  9:16 AM EDT Back to Top    Patient is aware of results and voiced her understanding. Upcoming appt  04/18/2022. Nothing further needed.    Pt cancelled her f/up appt with Dr. Sherene Sires and then on 05/16/2022 had acute sx again, Dr. Sherene Sires sent in prednisone Rx for her and she then came into PCP office on 6/6 (I saw her) for continued sx  Tx in office then with steroids, cough meds, CXR done to r/o CAP (which was negative)  05/21/2022 OV A&P: Assessment & Plan:    Pt presents with 1 week  of cough and URI sx that have gradually worsened, particularly cough is more productive and she has more nasal sx - congestion, discharge postnasal drip Was put on steroids last week 40 mg x 2d, then 20 mg x 2 d and currently on last 10 mg dose, not much improvement Not using inhaler much or trying OTC meds - she is concerned about how they may interact and affect her HR with steroids   Suspect viral URI and acute bronchitis Mild wheeze on exam cleared with one breathing tx - she refused a neb machine order - encouraged her to use inhaler more often   1. Acute bronchitis, unspecified organism No increased WOB, VSS, pt well appearing, mild wheeze cleared with neb - steroid burst - higher dose x 5 d, use inhaler more, cough meds sent in and encouraged mucinex - benzonatate (TESSALON) 100 MG capsule; Take 1-2 capsules (100-200 mg total) by mouth 3 (three) times daily as needed for cough.  Dispense: 30 capsule; Refill: 0 - predniSONE (DELTASONE) 20 MG tablet; Take 2 tablets (40 mg total) by mouth daily with breakfast for 5 days.  Dispense: 10 tablet; Refill: 0 - promethazine-dextromethorphan (PROMETHAZINE-DM) 6.25-15 MG/5ML syrup; Take 5 mLs by mouth 4 (four) times daily as needed for cough.  Dispense: 118 mL; Refill: 1 - albuterol (PROVENTIL) (2.5 MG/3ML) 0.083% nebulizer solution 2.5 mg   2. Rhinosinusitis Swelling and erythema - suspect baseline allergies acutely worsened with viral URI - advised to use OTC antihistamines, saline spray No sinus TTP, no acute worsening of sinus sx or fever, abx not currently indicated -  explained to pt this and she was urged to f/up if she worsens all the sudden   3. Cough, unspecified type CXR on hold in case any worsening After neb today reexamined, lungs CTA A&P to wheeze, rales or rhonchi - suspect viral URI/acute bronchitis - tx with steroids, inhalers, cough meds, mucinex, f/up if not improving Currently see no indication for abx   - DG Chest 2 View; Future - benzonatate (TESSALON) 100 MG capsule; Take 1-2 capsules (100-200 mg total) by mouth 3 (three) times daily as needed for cough.  Dispense: 30 capsule; Refill: 0 - predniSONE (DELTASONE) 20 MG tablet; Take 2 tablets (40 mg total) by mouth daily with breakfast for 5 days.  Dispense: 10 tablet; Refill: 0 - promethazine-dextromethorphan (PROMETHAZINE-DM) 6.25-15 MG/5ML syrup; Take 5 mLs by mouth 4 (four) times daily as needed for cough.  Dispense: 118 mL; Refill: 1 - albuterol (PROVENTIL) (2.5 MG/3ML) 0.083% nebulizer solution 2.5 mg   AVS instructions typed up, printed, and reviewed verbally with pt Given work note for 2 days She should f/up if any acute worsening   Pt instructions: Continue other supportive and symptomatic treatment - push fluids, rest, take tylenol and ibuprofen as directed on bottle/box for fever and pain   Can continue over the counter cold and cough medicines, antihistamines, steroid nasal sprays, decongestants - I would recommend for sure starting a antihistamine daily at bedtime- like zyrtec, claritin, allegra to help reduce your nasal symptoms and nasal discharge.  You can do nasal saline spray and prop the head of your bed up at night to avoid as much nighttime coughing I also recommend you start mucinex daily and push a lot of fluids Try over the counter cough medications like delsym or robitussin if the syrup and perles I sent in do not help you much.    Viral illness should gradually improve - sometimes it lasts a few  days sometimes 1-2 weeks   Use your inhaler more often when coughing  and wheezy   I ordered a higher steroid dose    If you have any suddenly worsening symptoms (nasal congestion/pain, pain with breathing, shortness of breath, new fever) I need you to follow up with me so I can recheck you and see if anything else is needed like a chest X-ray or an antibiotic     Return if symptoms worsen or fail to improve.   CPE Dec 2023 pt was wheezy on exam and again addressed: 6. Wheeze  R06.2 levocetirizine (XYZAL) 5 MG tablet      predniSONE (DELTASONE) 20 MG tablet    on expiration in all lung fields, denies SOB, doesn't want inhaler after offered and explained how it can help, steroids offered   Other acute OV 03/31/2023 pt noted again to be wheezy: She also notes hearing wheeze but not feeling congestion or SOB She does see cardiology, she refuses pulm consult, does have a rescue inhaler, hasn't ever had bronchitis or asthma before  May 05, 2023 acute OV for cough/wheeze/SOB (see the top of HPI)      Current Hx, she states SOB/wheeze/cough is worse at night and first thing in the am, sputum mostly clear but profuse, some associated posttussive emesis, she denies any acute weight gain/fluid retention and further states she just saw cardiology and they weren't concerned.  Pt has hx of CHF as well. However chart reviewed and last cardiology OV was 03/2023 (so not recent and certainly not since recent acute sx)  Pulm appt was set up for July but cancelled via automated reminder system  She has inhalers but isn't using regularly - prior symbicort, samples from office, trelegy, SABA she reports saba works to improve sx but only briefly, tessalon Perles also help for a short time.   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   Gastroesophageal reflux disease without esophagitis 07/17/2020   Uterine leiomyoma 07/17/2020   Atrial tachycardia 05/17/2019   Chest tightness 05/17/2019   Intermittent atrial fibrillation (HCC) 02/19/2018   Hypokalemia 02/10/2018   Anxiety 01/13/2018   Atrial fibrillation with RVR (HCC) 12/01/2017   Vitamin D deficiency 07/08/2017   Lower back pain 07/07/2017   Screening for colon cancer 07/07/2017   Class 3 severe obesity due to excess calories with serious comorbidity and body mass index (BMI) of 40.0 to 44.9 in adult [facility]) 07/07/2017   Essential hypertension 07/11/2016   IFG (impaired fasting glucose) 05/16/2016   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 05/16/2016   Abnormal thyroid function test 05/16/2016   Decreased libido 05/16/2016   Breast lump on left side at 5 o'clock position 05/16/2016   Bilateral thoracic back pain 06/01/2015   History of abnormal cervical Pap smear 06/01/2015      Current Outpatient Medications:    albuterol (VENTOLIN HFA) 108 (90 Base) MCG/ACT inhaler, Inhale 2 puffs into the lungs every 6 (six) hours as needed for wheezing or shortness of breath., Disp: 8 g, Rfl: 0   benzonatate (TESSALON) 100 MG capsule, Take 1 capsule (100 mg total) by mouth 3 (three) times daily as needed., Disp: 30 capsule, Rfl: 0   diltiazem (CARDIZEM) 30 MG tablet,  Take 1 tablet (30 mg total) by mouth 3 (three) times daily., Disp: 60 tablet, Rfl: 1   EQ ASPIRIN LOW DOSE 81 MG chewable tablet, , Disp: , Rfl:    Fluticasone-Umeclidin-Vilant (TRELEGY ELLIPTA) 100-62.5-25 MCG/ACT AEPB, Inhale 1 puff into the lungs daily., Disp: 28 each, Rfl: 2   pantoprazole (PROTONIX) 40 MG tablet, Take 1 tablet (40 mg total) by mouth daily., Disp: 30 tablet, Rfl: 2   propranolol (INDERAL) 20 MG tablet, Take 1 tablet (20 mg total) by mouth 3 (three) times daily as needed. (Patient not taking: Reported on 04/04/2023), Disp: 60 tablet, Rfl:  1   No Known Allergies   Social History   Tobacco Use   Smoking status: Never   Smokeless tobacco: Never  Vaping Use   Vaping status: Never Used  Substance Use Topics   Alcohol use: Not Currently    Alcohol/week: 0.0 standard drinks of alcohol    Comment: occ   Drug use: No      Chart Review Today: I personally reviewed active problem list, medication list, allergies, family history, social history, health maintenance, notes from last encounter, lab results, imaging with the patient/caregiver today.   Review of Systems  Constitutional: Negative.   HENT: Negative.    Eyes: Negative.   Respiratory: Negative.    Cardiovascular: Negative.   Gastrointestinal: Negative.   Endocrine: Negative.   Genitourinary: Negative.   Musculoskeletal: Negative.   Skin: Negative.   Allergic/Immunologic: Negative.   Neurological: Negative.   Hematological: Negative.   Psychiatric/Behavioral: Negative.    All other systems reviewed and are negative.      Objective:   Vitals:   08/26/23 1541  BP: 130/84  Pulse: 97  Resp: 16  Temp: (!) 97.5 F (36.4 C)  TempSrc: Temporal  SpO2: 96%  Weight: 267 lb 11.2 oz (121.4 kg)  Height: 5\' 6"  (1.676 m)    Body mass index is 43.21 kg/m.  Physical Exam Vitals and nursing note reviewed.  Constitutional:      General: She is not in acute distress.    Appearance: She is well-developed and well-groomed. She is obese. She is not ill-appearing, toxic-appearing or diaphoretic.  HENT:     Head: Normocephalic and atraumatic.  Eyes:     General:        Right eye: No discharge.        Left eye: No discharge.     Conjunctiva/sclera: Conjunctivae normal.  Neck:     Trachea: No tracheal deviation.  Cardiovascular:     Rate and Rhythm: Normal rate and regular rhythm.     Pulses: Normal pulses.     Heart sounds: Normal heart sounds.  Pulmonary:     Effort: Pulmonary effort is normal. No tachypnea, accessory muscle usage, prolonged  expiration, respiratory distress or retractions.     Breath sounds: No stridor. Wheezing and rhonchi present. No rales.     Comments: Able to speak in full and complete sentences Musculoskeletal:        General: Normal range of motion.  Skin:    General: Skin is warm and dry.     Findings: No rash.  Neurological:     Mental Status: She is alert.     Motor: No abnormal muscle tone.     Coordination: Coordination normal.  Psychiatric:        Attention and Perception: She is inattentive.        Mood and Affect: Affect is labile and angry.  Speech: Speech is rapid and pressured (multple times of cutting me off  answers to her questions).        Behavior: Behavior is uncooperative, agitated and aggressive.      Results for orders placed or performed in visit on 05/05/23  TSH  Result Value Ref Range   TSH 1.610 0.450 - 4.500 uIU/mL  FSH  Result Value Ref Range   FSH 59.7 mIU/mL  CBC  Result Value Ref Range   WBC 8.0 3.4 - 10.8 x10E3/uL   RBC 3.84 3.77 - 5.28 x10E6/uL   Hemoglobin 12.0 11.1 - 15.9 g/dL   Hematocrit 52.8 41.3 - 46.6 %   MCV 95 79 - 97 fL   MCH 31.3 26.6 - 33.0 pg   MCHC 32.8 31.5 - 35.7 g/dL   RDW 24.4 01.0 - 27.2 %   Platelets 269 150 - 450 x10E3/uL       Assessment & Plan:    Pt is a 52 y/o female with PMHx of CHF, obesity, with hx of wheeze/cough/pulm disease presents for 3+ weeks of worse sx following COVID infection on 8/15 She has some inhalers and samples but states she has tried them here and there with only brief temporary relief Prior PPI rxd for her with past explanation that it can tx her GERD and sometimes prevent GERD from coughing more throat congestion/cough/wheeze -she is not taking PPI, not doing daily inhalers previously given to her and prescribed for her Last OV May she was referred to pulm again but did not go after appt was scheduled. She is here with recurrent acute sx     ICD-10-CM   1. Acute bronchitis, unspecified organism   J20.9 benzonatate (TESSALON) 100 MG capsule    ipratropium-albuterol (DUONEB) 0.5-2.5 (3) MG/3ML SOLN    Respiratory Therapy Supplies (NEBULIZER/TUBING/MOUTHPIECE) KIT    predniSONE (STERAPRED UNI-PAK 21 TAB) 10 MG (21) TBPK tablet    dexamethasone (DECADRON) injection 10 mg    azithromycin (ZITHROMAX) 250 MG tablet  I explained to pt I suspected she did have URI or other acute illness (even lingering COVID) causing acute exacerbation of suspected underlying lung dx/disease. I explained to her again the importance of following up with Dr. Sherene Sires for further evaluation and testing and reviewed thoroughly with her his prior office visit test results and result notes and plan offering her earlier appointments if she is not feeling well and explained that she still needs to follow-up with their office for further evaluation to get a diagnosis and a treatment plan.  The goal is to get dx and figure out tx plan to control the underlying disease/condition better.she is currently experiencing very frequent upper respiratory infections and bouts of bronchitis and needing steroid Rx's several times a year.  She complains that we never make her all the way better but I have explained that steroids inhalers and cough medicines can help calm down an exacerbation but is unlikely to manage the underlying disease process whenever that may be.  I offered nebulizer machine today, I do feel she could benefit from it with frequent sx, and I explained how we will need to get this sent to medical supply store and she can call and see if it is covered by insurance.   I explained to her nebulizer medications and the duration of action of duonebs and how using these may help control her acute symptoms better than the rescue inhalers because she did express that albuterol only last for short amount of time  and she is having very bothersome sx when laying down at night and first thing in the am.  She has had some success in the  past with steroid prescription she did state today that the shot in Dr. Shona Simpson office was the most effective, but I do not have methylprednisolone here in office so she was offered the steroid injection t and she should follow with starting prednisone tomorrow am She first agreed to albuterol neb treatment, then later when she became angry she said she didn't want it and didn't want the neb machine order  She noted that [employer] were effective so I did refill these and adjusted the dose  Encouraged her to use Mucinex and other over-the-counter cough medications to help with sx  In the past we did x-rays to ensure that she did not have pneumonia and she did have negative imaging at that point in time, today there were breath sounds at all lung fields and no crackles or rails but scattered rhonchi in all lung fields and most of the inspiratory and expiratory wheeze, sounded very tight in the left lower lung fields.  I did not feel like CXR was indicated w/o fever, sweats, DOE.  Do feel like pulm f/up and pfts would be most helpful after she feels little better.  She was extremely dissatisfied with her care today and a message detailing conversation and concerns has been sent to clinic admin.  Unfortunately pt has hx of multiple cancellations, has not following medical advice and she continues to return with the same recurrent symptoms and also express that she is wasting her money coming here when we never help her get better.  I explained very clearly what I expect medications and prescriptions to be able to do and not to do, and was very clear that she is unlikely to have her symptoms overall be managed or well-controlled if she does not return to the pulmonologist for further discussion and evaluation.  She did not like that I was direct and clear with her.  She would not allow me to speak several times so I asked to please allow me to finish what I saw saying.  I explained that there is a limit to  what I can do in primary care, so that is why we have repeatedly referred her back to Dr. Sherene Sires.  She again stated he didn't do anything for her - and I read her his prior note and plan and f/up result note - which was very specific at noting he wanted to f/up with her, explain the results thoroughly with her and make a plan to manage her symptoms.  We printed his contact info.  And it will be up to her to make her fup appt, I have done referrals multiple times and she is a current/recent pt (OV last year).      2. Cough, unspecified type  R05.9 Respiratory Therapy Supplies (NEBULIZER/TUBING/MOUTHPIECE) KIT    dexamethasone (DECADRON) injection 10 mg    azithromycin (ZITHROMAX) 250 MG tablet   productive sputum with wheeze, worse at night and am, encouraged mucinex, cough meds rx'd and offered -tx plan below    3. Wheeze  R06.2 Respiratory Therapy Supplies (NEBULIZER/TUBING/MOUTHPIECE) KIT    dexamethasone (DECADRON) injection 10 mg    azithromycin (ZITHROMAX) 250 MG tablet   inspiratory and expiratory wheeze, worse to LLL, rhonchi and intermittent coughing, tx with steroids, nebs, zpak and cough meds sent in Pt did not have increased WOB, respiratory distress  or abnormal VS.   Pt is stable for outpt management with pulm f/up Without pulm she will likely have sx return or flare up  She does have multiple maintenance inhalers available but she is not using any of them regularly    4. Upper airway cough syndrome  R05.8 Respiratory Therapy Supplies (NEBULIZER/TUBING/MOUTHPIECE) KIT    azithromycin (ZITHROMAX) 250 MG tablet   strongly urged pt to do f/up with pulmonology due to continue sx and exacerbations - offered neb machine order/duoneb Previously rx PPI, which she is not taking She should f/up with Dr. Sherene Sires    5. History of bronchitis  Z87.09 Respiratory Therapy Supplies (NEBULIZER/TUBING/MOUTHPIECE) KIT    azithromycin (ZITHROMAX) 250 MG tablet   recurrent multiple times a year with pt  noncompliance with f/up with pulm    6. COVID-19  U07.1 benzonatate (TESSALON) 100 MG capsule    Respiratory Therapy Supplies (NEBULIZER/TUBING/MOUTHPIECE) KIT   dx 8/15, felt better with paxlovid, but URIsx and respiratory sx have been worse x 3 weeks, seems to closely follow covid illness    7. Patient noncompliance  Z91.199    pt refuses orders, referrals, med advice, and she cancels f/up appts/consults with specialists repeatedly, yet returns to clinic upset about continued sx.  I have again encouraged pt to f/up Dr. Sherene Sires and all his information was prepared and ready for her today and included in her AVS and handed to her via Maricela at the end of the OV.  Pt will no longer be under my care due to her dissatisfaction and I wish to longer care for her due to continued and repeated patient noncompliance coupled with accusations and complaints.  Per prior OV's and extensive documentation I have repeated gone above and beyond to care for her, often doing more than the schedule OV to address wheeze, f/up on test/meds/referrals often for things not scheduled for or discussed at appts, and the pt is still unsatisfied.  I have explained that I cannot continue to be per PCP or provide medical care for her any more when she does not follow medical advice.  I explained that she is welcome to see another provider or seek care elsewhere. Her future appt with me was cancelled and clinic admin and front office were notified today by me.         Danelle Berry, PA-C 08/26/23 3:04 PM

## 2023-08-26 NOTE — Patient Instructions (Signed)
Charlaine Dalton. Sherene Sires, MD Pulmonary/Critical Care Medicine [facility] Pulmonary Care at [place], [place] Main: [phone]

## 2023-09-17 ENCOUNTER — Ambulatory Visit (INDEPENDENT_AMBULATORY_CARE_PROVIDER_SITE_OTHER): Payer: BC Managed Care – PPO | Admitting: Pulmonary Disease

## 2023-09-17 ENCOUNTER — Encounter: Payer: Self-pay | Admitting: Pulmonary Disease

## 2023-09-17 VITALS — BP 142/96 | HR 76 | Temp 98.1°F | Ht 66.0 in | Wt 267.6 lb

## 2023-09-17 DIAGNOSIS — J4541 Moderate persistent asthma with (acute) exacerbation: Secondary | ICD-10-CM | POA: Diagnosis not present

## 2023-09-17 MED ORDER — BUDESONIDE-FORMOTEROL FUMARATE 160-4.5 MCG/ACT IN AERO: 2.0000 | INHALATION_SPRAY | Freq: Two times a day (BID) | RESPIRATORY_TRACT | 12 refills | Status: DC

## 2023-09-17 MED ORDER — PREDNISONE 20 MG PO TABS: 40.0000 mg | ORAL_TABLET | Freq: Every day | ORAL | 0 refills | Status: AC

## 2023-09-17 NOTE — Progress Notes (Signed)
Synopsis: Referred in by No ref. provider found   Subjective:   PATIENT ID: Lori Oliver GENDER: female DOB: 07/30/1971, MRN: [id number]  Chief Complaint  Patient presents with   Follow-up    Cough, shortness of breath and wheezing. Had COVID over a month ago. Trelegy being used as need only.     HPI Lori Oliver is a 52 year old female patient with a past medical history of paroxysmal A-fib not on anticoagulation recent diagnosis of cough variant asthma presenting to the pulmonary office for follow-up visit.  She reports that about a year ago she started having dry cough shortness of breath chest tightness and wheezing.  She saw Dr. Sherene Sires and was diagnosed with cough variant asthma.  She has had multiple presentations to her primary care physician with similar symptoms including wheezing chest tightness and dry cough.  She has been prescribed Trelegy nebulizer treatment and prednisone but has been reluctant to start.  She thinks this all started post-COVID that she had so far twice once last year and wants a month ago.  She reports low energy waking up in the morning with dry cough and not refreshed.  She denies any atopic dermatitis or allergic rhinitis.  She does have acid reflux and had a history of esophageal stricture status post dilation but also has not been compliant with her PPI.  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 No pets at home.   ROS All systems were reviewed and are negative except for the above.  Objective:   Vitals:   09/17/23 0939  BP: (!) 142/96  Pulse: 76  Temp: 98.1 F (36.7 C)  TempSrc: Temporal  SpO2: 99%  Weight: 267 lb 9.6 oz (121.4 kg)  Height: 5\' 6"  (1.676 m)   99% on RA BMI Readings from Last 3 Encounters:  09/17/23 43.19 kg/m  08/26/23 43.21 kg/m  05/05/23 44.00 kg/m   Wt Readings from Last 3 Encounters:  09/17/23 267  lb 9.6 oz (121.4 kg)  08/26/23 267 lb 11.2 oz (121.4 kg)  05/05/23 272 lb 9.6 oz (123.7 kg)    Physical Exam GEN: NAD, Healthy Appearing HEENT: Supple Neck, Reactive Pupils, EOMI  CVS: Normal S1, Normal S2, RRR, No murmurs or ES appreciated  Lungs: Diffuse expiratory wheezing.  Abdomen: Soft, non tender, non distended, + BS  Extremities: Warm and well perfused, No edema  Skin: No suspicious lesions appreciated  Psych: Normal Affect  Ancillary Information   CBC    Component Value Date/Time   WBC 8.0 05/28/2023 1543   WBC 8.6 02/28/2023 1510   RBC 3.84 05/28/2023 1543   RBC 4.24 02/28/2023 1510   HGB 12.0 05/28/2023 1543   HCT 36.6 05/28/2023 1543   PLT 269 05/28/2023 1543   MCV 95 05/28/2023 1543   MCV 97 03/09/2014 1729   MCH 31.3 05/28/2023 1543   MCH 31.4 02/28/2023 1510   MCHC 32.8 05/28/2023 1543   MCHC 33.1 02/28/2023 1510   RDW 12.5 05/28/2023 1543   RDW 12.7 03/09/2014 1729   LYMPHSABS 3,743 12/04/2022 1555   LYMPHSABS 2.9 07/07/2017 1543   LYMPHSABS 3.3 03/09/2014 1729   MONOABS 0.6 02/21/2022 1636   MONOABS 0.5 03/09/2014 1729   EOSABS 340 12/04/2022 1555   EOSABS 0.2 07/07/2017 1543   EOSABS 0.4 03/09/2014 1729   BASOSABS 42 12/04/2022 1555   BASOSABS 0.0 07/07/2017 1543  BASOSABS 0.1 03/09/2014 1729    Imaging  CXR 02/28/2023 -mild cardiomegaly no active pulmonary disease.     No data to display           Assessment & Plan:  Lori Oliver is a 52 year old female patient with a past medical history of paroxysmal A-fib not on anticoagulation recent diagnosis of cough variant asthma presenting to the pulmonary office for follow-up visit.  # Moderate persistent asthma with acute exacerbation. Advised and counseled Ms. Lori Oliver about her asthma and possible reactive airway disease post-COVID.  Also emphasized the importance of taking her maintenance inhaler as this will improve her breathing and therefore will improve her quality of life and make her  feel better.  I also relayed to her that her maintenance inhaler does not have to be permanent and we can start tapering down once we reached a steady state.  If her insurance does not approve Symbicort then she will start her Trelegy Ellipta that she already picked up from the pharmacy but this is triple therapy and I do not think we need it right now.  []  Start prednisone 40 mg 1 tab for 5 days. []  Start budesonide-formoterol [Symbicort] 160-80 2 puffs twice a day. []  Continue with albuterol 2 puffs every 6 hours as needed for wheezing shortness of breath or cough. []  Obtain pulmonary function tests. []  CBC with differential and allergen panel.  # GERD Advised on lifestyle modifications.  She is reluctant to taking PPI and therefore we will trial GERD diet and abstinence from having anything per mouth 2 hours before bedtime.  # Morbid obesity Her BMI is currently 43.  Advised on the importance of weight loss and its impact on breathing.  She reports that she trialed multiple diets without any success.  I recommended discussing with her primary care regarding Ozempic treatment for weight loss.  Return in about 4 weeks (around 10/15/2023).  I spent 60 minutes caring for this patient today, including preparing to see the patient, obtaining a medical history , reviewing a separately obtained history, performing a medically appropriate examination and/or evaluation, counseling and educating the patient/family/caregiver, ordering medications, tests, or procedures, and referring and communicating with other health care professionals (not separately reported)  Janann Colonel, MD [facility] 09/17/2023 10:22 AM

## 2023-10-14 ENCOUNTER — Ambulatory Visit (INDEPENDENT_AMBULATORY_CARE_PROVIDER_SITE_OTHER): Payer: BC Managed Care – PPO | Admitting: Physician Assistant

## 2023-10-14 ENCOUNTER — Ambulatory Visit: Payer: BC Managed Care – PPO | Attending: Pulmonary Disease

## 2023-10-14 ENCOUNTER — Other Ambulatory Visit (HOSPITAL_COMMUNITY)
Admission: RE | Admit: 2023-10-14 | Discharge: 2023-10-14 | Disposition: A | Discharge status: Home / Self Care | Payer: BC Managed Care – PPO | Source: Ambulatory Visit | Attending: Nurse Practitioner | Admitting: Nurse Practitioner

## 2023-10-14 ENCOUNTER — Ambulatory Visit: Payer: Self-pay

## 2023-10-14 ENCOUNTER — Encounter: Payer: Self-pay | Admitting: Physician Assistant

## 2023-10-14 VITALS — BP 138/86 | HR 83 | Temp 97.3°F | Resp 16 | Ht 66.0 in | Wt 271.0 lb

## 2023-10-14 DIAGNOSIS — B3731 Acute candidiasis of vulva and vagina: Secondary | ICD-10-CM | POA: Insufficient documentation

## 2023-10-14 DIAGNOSIS — N898 Other specified noninflammatory disorders of vagina: Secondary | ICD-10-CM

## 2023-10-14 DIAGNOSIS — B9689 Other specified bacterial agents as the cause of diseases classified elsewhere: Secondary | ICD-10-CM | POA: Insufficient documentation

## 2023-10-14 DIAGNOSIS — N760 Acute vaginitis: Secondary | ICD-10-CM | POA: Diagnosis not present

## 2023-10-14 DIAGNOSIS — R319 Hematuria, unspecified: Secondary | ICD-10-CM | POA: Diagnosis not present

## 2023-10-14 LAB — POCT URINALYSIS DIPSTICK
Bilirubin, UA: NEGATIVE
Glucose, UA: NEGATIVE
Ketones, UA: NEGATIVE
Nitrite, UA: NEGATIVE
Protein, UA: NEGATIVE
Spec Grav, UA: 1.02 (ref 1.010–1.025)
Urobilinogen, UA: 0.2 U/dL
pH, UA: 6.5 (ref 5.0–8.0)

## 2023-10-14 MED ORDER — FLUCONAZOLE 150 MG PO TABS
150.0000 mg | ORAL_TABLET | ORAL | 0 refills | Status: DC | PRN
Start: 2023-10-14 — End: 2024-04-21

## 2023-10-14 NOTE — Telephone Encounter (Signed)
Chief Complaint: Vaginal discharge Symptoms: vaginal discharge thick and white, vaginal itching Frequency: constant, onset 4 days ago Pertinent Negatives: Patient denies vaginal and abdominal pain, odor, fever Disposition: [] ED /[] Urgent Care (no appt availability in office) / [x] Appointment(In office/virtual)/ []  [place] [facility]/ [] Home Care/ [] Refused Recommended Disposition /[] [place] Mobile Bus/ []  Follow-up with PCP Additional Notes: Patient states she has noticed vaginal discharge that is thick and white with vaginal itching over the past 4 days. Patient denies any odor, bleeding, or abdominal pain. Patient is requesting diflican be sent to her pharmacy at this time. Care advice was given and patient was offered an appointment for evaluation. Patient has been scheduled to be seen today at 1440.   Summary: yeast infection   Pt called saying she has a yeast infection and needs rx for diflucan  She uses [facility] on [address]  [phone]     Reason for Disposition  Abnormal color vaginal discharge (i.e., yellow, green, gray)  Answer Assessment - Initial Assessment Questions 1. DISCHARGE: "Describe the discharge." (e.g., white, yellow, green, gray, foamy, cottage cheese-like)     Thick and white 2. ODOR: "Is there a bad odor?"     No 3. ONSET: "When did the discharge begin?"     4 days ago 4. RASH: "Is there a rash in the genital area?" If Yes, ask: "Describe it." (e.g., redness, blisters, sores, bumps)     No  5. ABDOMEN PAIN: "Are you having any abdomen pain?" If Yes, ask: "What does it feel like? " (e.g., crampy, dull, intermittent, constant)      No 6. ABDOMEN PAIN SEVERITY: If present, ask: "How bad is it?" (e.g., Scale 1-10; mild, moderate, or severe)   - MILD (1-3): Doesn't interfere with normal activities, abdomen soft and not tender to touch.    - MODERATE (4-7): Interferes with normal activities or awakens from sleep, abdomen tender to touch.    - SEVERE  (8-10): Excruciating pain, doubled over, unable to do any normal activities. (R/O peritonitis)      No  7. CAUSE: "What do you think is causing the discharge?" "Have you had the same problem before? What happened then?"     I think I have a yeast infection, yes about 5 or 6 months ago  8. OTHER SYMPTOMS: "Do you have any other symptoms?" (e.g., fever, itching, vaginal bleeding, pain with urination, injury to genital area, vaginal foreign body)     Itching  Protocols used: Vaginal Discharge-A-AH

## 2023-10-14 NOTE — Progress Notes (Unsigned)
Acute Office Visit   Patient: Lori Oliver   DOB: [birth year]   52 y.o. Female  MRN: [id number] Visit Date: 10/14/2023  Today's healthcare provider: Oswaldo Conroy Ritamarie Arkin, PA-C  Introduced myself to the patient as a Secondary school teacher and provided education on APPs in clinical practice.    Chief Complaint  Patient presents with   Vaginal Discharge    vaginal discharge thick and white   Vaginal Itching    Onset for 4 days   Subjective    HPI HPI     Vaginal Discharge    Additional comments: vaginal discharge thick and white        Vaginal Itching    Additional comments: Onset for 4 days      Last edited by Forde Radon, CMA on 10/14/2023  2:51 PM.      Vaginal discharge  Onset: sudden  Duration: ongoing for 4 days  Associated symptoms: Reports some changes to vaginal discharge and itching in genital area She denies dysuria, bleeding, spotting, flank pain, fevers,chills  Interventions: nothing   Medications: Outpatient Medications Prior to Visit  Medication Sig   albuterol (VENTOLIN HFA) 108 (90 Base) MCG/ACT inhaler Inhale 2 puffs into the lungs every 6 (six) hours as needed for wheezing or shortness of breath.   benzonatate (TESSALON) 100 MG capsule Take 1-2 capsules (100-200 mg total) by mouth 3 (three) times daily as needed.   budesonide-formoterol (SYMBICORT) 160-4.5 MCG/ACT inhaler Inhale 2 puffs into the lungs in the morning and at bedtime.   diltiazem (CARDIZEM) 30 MG tablet Take 1 tablet (30 mg total) by mouth 3 (three) times daily.   EQ ASPIRIN LOW DOSE 81 MG chewable tablet    Fluticasone-Umeclidin-Vilant (TRELEGY ELLIPTA) 100-62.5-25 MCG/ACT AEPB Inhale 1 puff into the lungs daily.   ipratropium-albuterol (DUONEB) 0.5-2.5 (3) MG/3ML SOLN Take 3 mLs by nebulization 3 (three) times daily as needed.   pantoprazole (PROTONIX) 40 MG tablet Take 1 tablet (40 mg total) by mouth daily.   propranolol (INDERAL) 20 MG tablet Take 1 tablet (20 mg total) by  mouth 3 (three) times daily as needed.   azithromycin (ZITHROMAX) 250 MG tablet Take 2 tabs (500 mg) PO qd x 1, then take 1 tab (250 mg) PO qd for day 2-5 (Patient not taking: Reported on 09/17/2023)   predniSONE (STERAPRED UNI-PAK 21 TAB) 10 MG (21) TBPK tablet Take as directed on package.  (60 mg po on day 1, 50 mg po on day 2...) (Patient not taking: Reported on 09/17/2023)   Respiratory Therapy Supplies (NEBULIZER/TUBING/MOUTHPIECE) KIT Disp one nebulizer machine, tubing set and mouthpiece kit (Patient not taking: Reported on 09/17/2023)   No facility-administered medications prior to visit.    Review of Systems  Constitutional:  Negative for chills, fatigue and fever.  Gastrointestinal:  Negative for abdominal pain.  Genitourinary:  Positive for vaginal discharge. Negative for dysuria, vaginal bleeding and vaginal pain.    {Insert previous labs (optional):23779} {See past labs  Heme  Chem  Endocrine  Serology  Results Review (optional):1}   Objective    BP 138/86   Pulse 83   Temp (!) 97.3 F (36.3 C) (Oral)   Resp 16   Ht 5\' 6"  (1.676 m)   Wt 271 lb (122.9 kg)   LMP 04/15/2020 (Within Days) Comment: Dec 2-5, 2021 spotting really light  SpO2 99%   BMI 43.74 kg/m  {Insert last BP/Wt (optional):23777}{See vitals history (optional):1}   Physical Exam Vitals  reviewed.  Constitutional:      General: She is awake.     Appearance: Normal appearance. She is well-developed and well-groomed.  HENT:     Head: Normocephalic and atraumatic.  Pulmonary:     Effort: Pulmonary effort is normal.  Neurological:     General: No focal deficit present.     Mental Status: She is alert and oriented to person, place, and time.     GCS: GCS eye subscore is 4. GCS verbal subscore is 5. GCS motor subscore is 6.  Psychiatric:        Attention and Perception: Attention and perception normal.        Mood and Affect: Mood and affect normal.        Speech: Speech normal.        Behavior:  Behavior is cooperative.       No results found for any visits on 10/14/23.  Assessment & Plan      No follow-ups on file.       Problem List Items Addressed This Visit   None Visit Diagnoses     White vaginal discharge    -  Primary   Relevant Medications   fluconazole (DIFLUCAN) 150 MG tablet   Other Relevant Orders   Cervicovaginal ancillary only   POCT Urinalysis Dipstick        No follow-ups on file.   I, Kandise Riehle E Javoni Lucken, PA-C, have reviewed all documentation for this visit. The documentation on 10/14/23 for the exam, diagnosis, procedures, and orders are all accurate and complete.   Jacquelin Hawking, MHS, PA-C [facility]

## 2023-10-15 ENCOUNTER — Ambulatory Visit: Payer: BC Managed Care – PPO | Admitting: Pulmonary Disease

## 2023-10-15 LAB — URINE CULTURE
MICRO NUMBER:: 15659770
SPECIMEN QUALITY:: ADEQUATE

## 2023-10-15 NOTE — Progress Notes (Signed)
Your urine testing showed a moderate amount of blood and white blood cells which can sometimes be a sign of UTI. This can also happen with vaginal irritation or infections such as a yeast infection. I have sent a sample for culture and will keep you updated once your results are back.

## 2023-10-16 LAB — CERVICOVAGINAL ANCILLARY ONLY
Bacterial Vaginitis (gardnerella): POSITIVE — AB
Candida Glabrata: NEGATIVE
Candida Vaginitis: POSITIVE — AB
Chlamydia: NEGATIVE
Comment: NEGATIVE
Comment: NEGATIVE
Comment: NEGATIVE
Comment: NEGATIVE
Comment: NEGATIVE
Comment: NORMAL
Neisseria Gonorrhea: NEGATIVE
Trichomonas: NEGATIVE

## 2023-10-20 MED ORDER — METRONIDAZOLE 500 MG PO TABS: 500.0000 mg | ORAL_TABLET | Freq: Two times a day (BID) | ORAL | 0 refills | Status: DC

## 2023-10-20 NOTE — Addendum Note (Signed)
Addended by: Jacquelin Hawking on: 10/20/2023 09:05 AM   Modules accepted: Orders

## 2023-10-23 ENCOUNTER — Other Ambulatory Visit: Payer: Self-pay | Admitting: Nurse Practitioner

## 2023-10-23 ENCOUNTER — Ambulatory Visit: Payer: Self-pay

## 2023-10-23 DIAGNOSIS — B379 Candidiasis, unspecified: Secondary | ICD-10-CM

## 2023-10-23 MED ORDER — FLUCONAZOLE 150 MG PO TABS: 150.0000 mg | ORAL_TABLET | ORAL | 0 refills | Status: DC | PRN

## 2023-10-23 NOTE — Telephone Encounter (Signed)
FYI

## 2023-10-23 NOTE — Telephone Encounter (Signed)
Chief Complaint: Vaginal itching  Symptoms: Mild vaginal itching after completing ABT Frequency: constant  Pertinent Negatives: Patient denies discharge, pain, fever, abdominal pain  Disposition: [] ED /[] Urgent Care (no appt availability in office) / [] Appointment(In office/virtual)/ []  [place] [facility]/ [x] Home Care/ [] Refused Recommended Disposition /[] [place] Mobile [facility]/ []  Follow-up with PCP Additional Notes: Patient states she was prescribed Fluconazole 150 MG on 10/14/23 and at that time she took both doses of the medication. Patient stated she was later prescribed Flagyl 500 MG last Wednesday because labs results Dx was BV. Patient stated after completing the Flagyl she began to have mild vaginal itching. Patient is requesting a dose of Fluconazole 150 MG be sent to [facility] to treat the itching. Care advise given and patient stated she does not want to come back to the office for this. Advised patient I would forward request to PCP for additional recommendations. Patient verbalized understanding.    Summary: needs refill for fluconazole (DIFLUCAN) 150 MG tablet   Patient called and states she was prescribed fluconazole (DIFLUCAN) 150 MG tablet at her office visit with Erin Mecum on 10.29.2024 and she took that. Then patient states she received her test results after taking that medication and was put on an antbiotic and now patient needs medication fluconazole (DIFLUCAN) 150 MG tablet called in again because the antibiotic will cause her to have a yeast infection and needs this to prevent it, she has been trying to eat yogurt as well.  Pharmacy: [facility] [address] [place] - [address] [place] [address] Phone: [phone] Fax: [phone] Hours: Not open 24 hours    Patients callback # [phone]     Reason for Disposition  [1] Taking antibiotic AND [2] symptoms are BETTER AND [3] no fever  Answer Assessment - Initial  Assessment Questions 1. INFECTION: "What infection is the antibiotic being given for?"     Bacteria Vaginosis   2. ANTIBIOTIC: "What antibiotic are you taking" "How many times per day?"     Flagyl 500 MG  3. DURATION: "When was the antibiotic started?"     Wednesday  4. MAIN CONCERN OR SYMPTOM:  "What is your main concern right now?"     Mild itching  5. BETTER-SAME-WORSE: "Are you getting better, staying the same, or getting worse compared to when you first started the antibiotics?" If getting worse, ask: "In what way?"      Better  6. FEVER: "Do you have a fever?" If Yes, ask: "What is your temperature, how was it measured, and when did it start?"     No  7. SYMPTOMS: "Are there any other symptoms you're concerned about?" If Yes, ask: "When did it start?"     No  8. FOLLOW-UP APPOINTMENT: "Do you have a follow-up appointment with your doctor?"     No  Protocols used: Infection on Antibiotic Follow-up Call-A-AH

## 2023-12-12 ENCOUNTER — Encounter: Payer: BC Managed Care – PPO | Admitting: Family Medicine

## 2024-03-15 DIAGNOSIS — J069 Acute upper respiratory infection, unspecified: Secondary | ICD-10-CM | POA: Diagnosis not present

## 2024-04-21 ENCOUNTER — Ambulatory Visit
Admission: RE | Admit: 2024-04-21 | Discharge: 2024-04-21 | Disposition: A | Discharge status: Home / Self Care | Source: Ambulatory Visit | Attending: Nurse Practitioner

## 2024-04-21 ENCOUNTER — Ambulatory Visit (INDEPENDENT_AMBULATORY_CARE_PROVIDER_SITE_OTHER): Admitting: Nurse Practitioner

## 2024-04-21 ENCOUNTER — Encounter: Payer: Self-pay | Admitting: Nurse Practitioner

## 2024-04-21 ENCOUNTER — Ambulatory Visit: Payer: Self-pay | Admitting: *Deleted

## 2024-04-21 ENCOUNTER — Ambulatory Visit
Admission: RE | Admit: 2024-04-21 | Discharge: 2024-04-21 | Disposition: A | Discharge status: Home / Self Care | Attending: Nurse Practitioner | Admitting: Nurse Practitioner

## 2024-04-21 VITALS — BP 132/78 | HR 98 | Temp 98.3°F | Resp 18 | Ht 66.0 in | Wt 270.1 lb

## 2024-04-21 DIAGNOSIS — R051 Acute cough: Secondary | ICD-10-CM | POA: Diagnosis not present

## 2024-04-21 DIAGNOSIS — J4541 Moderate persistent asthma with (acute) exacerbation: Secondary | ICD-10-CM | POA: Diagnosis not present

## 2024-04-21 DIAGNOSIS — R0602 Shortness of breath: Secondary | ICD-10-CM

## 2024-04-21 DIAGNOSIS — R059 Cough, unspecified: Secondary | ICD-10-CM | POA: Diagnosis not present

## 2024-04-21 LAB — POC COVID19/FLU A&B COMBO
Covid Antigen, POC: NEGATIVE
Influenza A Antigen, POC: NEGATIVE
Influenza B Antigen, POC: NEGATIVE

## 2024-04-21 MED ORDER — PREDNISONE 20 MG PO TABS: 40.0000 mg | ORAL_TABLET | Freq: Every day | ORAL | 0 refills | Status: AC

## 2024-04-21 MED ORDER — FLUTICASONE PROPIONATE 50 MCG/ACT NA SUSP: 2.0000 | Freq: Every day | NASAL | 0 refills | Status: DC

## 2024-04-21 MED ORDER — CETIRIZINE HCL 10 MG PO TABS: 10.0000 mg | ORAL_TABLET | Freq: Every day | ORAL | 0 refills | Status: AC

## 2024-04-21 MED ORDER — BENZONATATE 100 MG PO CAPS: 200.0000 mg | ORAL_CAPSULE | Freq: Two times a day (BID) | ORAL | 0 refills | Status: DC | PRN

## 2024-04-21 MED ORDER — HYDROCOD POLI-CHLORPHE POLI ER 10-8 MG/5ML PO SUER: 5.0000 mL | Freq: Two times a day (BID) | ORAL | 0 refills | Status: DC | PRN

## 2024-04-21 NOTE — Telephone Encounter (Signed)
 Copied from CRM [id number]. Topic: Clinical - Red Word Triage >> Apr 21, 2024  7:50 AM Carlatta H wrote: [facility] that prompted transfer to Nurse Triage: Patient has had shortness of breath, wheezing and cough for 3 days// Reason for Disposition  [1] MILD difficulty breathing (e.g., minimal/no SOB at rest, SOB with walking, pulse <100) AND [2] NEW-onset or WORSE than normal  Answer Assessment - Initial Assessment Questions 1. RESPIRATORY STATUS: "Describe your breathing?" (e.g., wheezing, shortness of breath, unable to speak, severe coughing)      I'm having shortness of breath when walking, coughing and wheezing.    2. ONSET: "When did this breathing problem begin?"      Started 3 days ago.    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    No sore throat, runny nose.    I've been taking cough syrup.   I have A. Fib.   I'm very tired.    I'm congested in my chest 3. PATTERN "Does the difficult breathing come and go, or has it been constant since it started?"      When I'm walking I'm short of breath 4. SEVERITY: "How bad is your breathing?" (e.g., mild, moderate, severe)    - MILD: No SOB at rest, mild SOB with walking, speaks normally in sentences, can lie down, no retractions, pulse < 100.    - MODERATE: SOB at rest, SOB with minimal exertion and prefers to sit, cannot lie down flat, speaks in phrases, mild retractions, audible wheezing, pulse 100-120.    - SEVERE: Very SOB at rest, speaks in single words, struggling to breathe, sitting hunched forward, retractions, pulse > 120      Moderate 5. RECURRENT SYMPTOM: "Have you had difficulty breathing before?" If Yes, ask: "When was the last time?" and "What happened that time?"      I have asthma     Don't smoke     Yes 6. CARDIAC HISTORY: "Do you have any history of heart disease?" (e.g., heart attack, angina, bypass surgery, angioplasty)      A. Fib. 7. LUNG HISTORY: "Do you have any history of lung disease?"  (e.g.,  pulmonary embolus, asthma, emphysema)     Asthma 8. CAUSE: "What do you think is causing the breathing problem?"      I had a cold 9. OTHER SYMPTOMS: "Do you have any other symptoms? (e.g., dizziness, runny nose, cough, chest pain, fever)     Fatigue, A. Fib.   10. O2 SATURATION MONITOR:  "Do you use an oxygen saturation monitor (pulse oximeter) at home?" If Yes, ask: "What is your reading (oxygen level) today?" "What is your usual oxygen saturation reading?" (e.g., 95%)       N/A 11. PREGNANCY: "Is there any chance you are pregnant?" "When was your last menstrual period?"       N/A 12. TRAVEL: "Have you traveled out of the country in the last month?" (e.g., travel history, exposures)       N/A  Protocols used: Breathing Difficulty-A-AH  Chief Complaint: Shortness of breath with exertion Symptoms: wheezing, coughing, fatigue Frequency: For last 3 days Pertinent Negatives: Patient denies fever Disposition: [] ED /[] Urgent Care (no appt availability in office) / [x] Appointment(In office/virtual)/ []  [place] Virtual [facility]/ [] Home Care/ [] Refused Recommended Disposition /[]  Mobile Bus/ []  Follow-up with PCP Additional Notes: Appt made for this morning with Donny Gall, FNP

## 2024-04-21 NOTE — Progress Notes (Signed)
 BP 132/78   Pulse 98   Temp 98.3 F (36.8 C)   Resp 18   Ht 5\' 6"  (1.676 m)   Wt 270 lb 1.6 oz (122.5 kg)   LMP 04/15/2020 (Within Days) Comment: Dec 2-5, 2021 spotting really light  SpO2 96%   BMI 43.60 kg/m    Subjective:    Patient ID: Lori Oliver, female    DOB: 01/14/1971, 53 y.o.   MRN: [id number]  HPI: Lori Oliver is a 53 y.o. female  Chief Complaint  Patient presents with   URI    Onset 3 days headache, sob, cough and congestion    Discussed the use of AI scribe software for clinical note transcription with the patient, who gave verbal consent to proceed.  History of Present Illness [name] "Lori Oliver" is a 53 year old female with asthma who presents with increased shortness of breath and fatigue.  Over the past three days, she has experienced increased coughing, fatigue, and shortness of breath. The shortness of breath was severe enough last night at work that she could not wear her mask. Resting at home provided some relief, although she continues to experience wheezing.  She has a history of asthma and uses an albuterol  inhaler as needed. She also has a heart medication that she takes as needed, though she has not required it recently. She is unsure about her use of Symbicort  but mentions having Trelegy, which she has not used yet. She has been taking an over-the-counter allergy pill but feels it is not strong enough.  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 She has a history of using cough syrup and pearl pills for upper respiratory issues, which she finds helpful.  No fever or swelling in her legs.         10/14/2023    2:51 PM 08/26/2023    3:41 PM 05/05/2023    3:48 PM  Depression screen PHQ 2/9  Decreased Interest 0 0 0  Down, Depressed, Hopeless 0 0 0  PHQ - 2 Score 0 0 0  Altered sleeping 0 0 0  Tired, decreased energy 0 0 0  Change in appetite 0 0 0  Feeling bad or failure about yourself  0 0  0  Trouble concentrating 0 0 0  Moving slowly or fidgety/restless 0 0 0  Suicidal thoughts 0 0 0  PHQ-9 Score 0 0 0  Difficult doing work/chores Not difficult at all Not difficult at all Not difficult at all    Relevant past medical, surgical, family and social history reviewed and updated as indicated. Interim medical history since our last visit reviewed. Allergies and medications reviewed and updated.  Review of Systems  Ten systems reviewed and is negative except as mentioned in HPI      Objective:    BP 132/78   Pulse 98   Temp 98.3 F (36.8 C)   Resp 18   Ht 5\' 6"  (1.676 m)   Wt 270 lb 1.6 oz (122.5 kg)   LMP 04/15/2020 (Within Days) Comment: Dec 2-5, 2021 spotting really light  SpO2 96%   BMI 43.60 kg/m    Wt Readings from Last 3 Encounters:  04/21/24 270 lb 1.6 oz (122.5 kg)  10/14/23 271 lb (122.9 kg)  09/17/23 267 lb 9.6 oz (121.4 kg)    Physical Exam Physical Exam GENERAL: Alert, cooperative, well developed, no acute distress HEENT: Normocephalic, normal oropharynx, moist mucous membranes, facial tenderness present,  tympanic membranes clear, nasal congestion present NECK: No lymphadenopathy CHEST: Lungs with wheezing and rhonchi CARDIOVASCULAR: Normal heart rate and rhythm, S1 and S2 normal without murmurs ABDOMEN: Soft, non-tender, non-distended, without organomegaly, normal bowel sounds EXTREMITIES: No cyanosis or edema NEUROLOGICAL: Cranial nerves grossly intact, moves all extremities without gross motor or sensory deficit        Assessment & Plan:   Problem List Items Addressed This Visit       Respiratory   Moderate persistent asthma with acute exacerbation   Relevant Medications   predniSONE  (DELTASONE ) 20 MG tablet   Other Relevant Orders   DG Chest 2 View     Other   Shortness of breath   Relevant Medications   predniSONE  (DELTASONE ) 20 MG tablet   Other Relevant Orders   DG Chest 2 View   Other Visit Diagnoses       Acute cough     -  Primary   Relevant Medications   cetirizine (ZYRTEC) 10 MG tablet   fluticasone  (FLONASE ) 50 MCG/ACT nasal spray   benzonatate  (TESSALON ) 100 MG capsule   chlorpheniramine-HYDROcodone (TUSSIONEX) 10-8 MG/5ML   Other Relevant Orders   POC Covid19/Flu A&B Antigen (Completed)   DG Chest 2 View        Assessment and Plan Assessment & Plan Asthma exacerbation Acute exacerbation of asthma with increased coughing, fatigue, and shortness of breath over the past three days. Wheezing and rhonchi present on lung examination. No fever reported. Differential diagnosis includes pneumonia, hence the need for a chest x-ray. COVID-19 and influenza tests are negative. Current medications include albuterol  and Trelegy inhalers. She has not been using Symbicort . The exacerbation may be related to inadequate control of inflammation due to not using the maintenance inhaler. Informed consent was obtained for the use of oral steroids and Trelegy inhaler, explaining that steroids help reduce inflammation and improve breathing. - Order chest x-ray to rule out pneumonia. - Prescribe oral steroids to reduce inflammation. - Instruct to use Trelegy inhaler once daily. - Advise increased fluid intake. - Recommend plain Mucinex for mucus management. - Prescribe cough syrup for symptomatic relief. - If chest x-ray indicates pneumonia, prescribe antibiotics.  Nasal congestion Nasal congestion present, contributing to respiratory symptoms. No facial tenderness or lymphadenopathy noted. She has been using an over-the-counter allergy pill but feels it is insufficient. Informed consent was obtained for the use of Zyrtec and Flonase , explaining that these medications help manage allergy symptoms and nasal congestion. - Prescribe Zyrtec for allergy management. - Recommend Flonase  nasal spray, advising to check over-the-counter prices for cost-effectiveness.        Follow up plan: Return if symptoms worsen or fail to  improve.

## 2024-06-23 ENCOUNTER — Encounter: Payer: Self-pay | Admitting: Nurse Practitioner

## 2024-06-23 ENCOUNTER — Telehealth: Payer: Self-pay | Admitting: Nurse Practitioner

## 2024-06-23 DIAGNOSIS — Z1231 Encounter for screening mammogram for malignant neoplasm of breast: Secondary | ICD-10-CM

## 2024-06-23 NOTE — Telephone Encounter (Signed)
 Copied from CRM [id number]. Topic: Referral - Request for Referral >> Jun 23, 2024  1:09 PM Turkey B wrote: Did the patient discuss referral with their provider in the last year? No because just routine mammo    Type of order/referral and detailed reason for visit: routine mammogram  Preference of office, provider, location: ?  If referral order, have you been seen by this specialty before? yes  Can we respond through MyChart? yes

## 2024-06-23 NOTE — Telephone Encounter (Signed)
 Ordered vm left for patient

## 2024-07-05 ENCOUNTER — Ambulatory Visit: Attending: Cardiovascular Disease | Admitting: Cardiovascular Disease

## 2024-07-05 ENCOUNTER — Encounter: Payer: Self-pay | Admitting: Cardiovascular Disease

## 2024-07-05 ENCOUNTER — Telehealth: Payer: Self-pay | Admitting: Cardiovascular Disease

## 2024-07-05 VITALS — BP 130/78 | HR 85 | Ht 66.0 in | Wt 270.5 lb

## 2024-07-05 DIAGNOSIS — I10 Essential (primary) hypertension: Secondary | ICD-10-CM

## 2024-07-05 DIAGNOSIS — I4891 Unspecified atrial fibrillation: Secondary | ICD-10-CM

## 2024-07-05 DIAGNOSIS — E785 Hyperlipidemia, unspecified: Secondary | ICD-10-CM

## 2024-07-05 DIAGNOSIS — R0602 Shortness of breath: Secondary | ICD-10-CM

## 2024-07-05 DIAGNOSIS — I509 Heart failure, unspecified: Secondary | ICD-10-CM

## 2024-07-05 MED ORDER — DILTIAZEM HCL 30 MG PO TABS
30.0000 mg | ORAL_TABLET | Freq: Three times a day (TID) | ORAL | 2 refills | Status: AC
Start: 2024-07-05 — End: ?

## 2024-07-05 MED ORDER — PROPRANOLOL HCL 20 MG PO TABS: 20.0000 mg | ORAL_TABLET | Freq: Three times a day (TID) | ORAL | 2 refills | Status: AC | PRN

## 2024-07-05 NOTE — Progress Notes (Signed)
 Date:  07/05/2024   ID:  Devin MARLA Gravely, DOB [birth year], MRN [id number]  Patient Location:  [address] [place] [address]   Provider location:   [facility], [place] office  PCP:  Gareth Mliss FALCON, FNP  Cardiologist:  Perla [facility]  Chief Complaint  Patient presents with   Follow-up    SOB and discuss heart rhythm. Meds reviewed verbally with pt.   History of Present Illness:    Lori Oliver is a 53 y.o. female past medical history of anxiety Atrial fib in 12/01/2017 after having taken  Sudafed and cough medicine HTN Morbid obesity  Lost her husband several years ago to cancer March 13, 2019 hospitalization for atrial fibrillation Who presents for follow-up of her paroxysmal atrial fibrillation, atypical chest pain  Last seen by myself in clinic 4/23 Hospital admission for atrial fibrillation converted with IV diltiazem  3/24  Last seen in clinic by one of our providers April 2024 At that time was in normal sinus rhythm  In follow-up today reports having 2 episodes of left-sided chest pain 1 was at work bending over, acute pain, had difficulty taking a deep breath in, Resolved without intervention Second was in church, episode not as bad and again resolved without intervention  Imaging reviewed Echo 2/24 Left ventricular ejection fraction, by estimation, is 60 to 65%. The left ventricle has normal function. The left ventricle has no regional wall motion abnormalities. Left ventricular diastolic parameters were normal.   2. Right ventricular systolic function is normal. The right ventricular size is normal. Tricuspid regurgitation signal is inadequate for assessing PA pressure.   3. The mitral valve is normal in structure. Mild mitral valve regurgitation. No evidence of mitral stenosis.   4. The aortic valve is tricuspid. Aortic valve regurgitation is not visualized. No aortic stenosis is present.   5. The inferior vena cava is normal in size  with greater than 50% respiratory variability, suggesting right atrial pressure of 3 mmHg.   6. Rhythm is normal sinus   EKG personally reviewed by myself on todays visit EKG Interpretation Date/Time:  Monday July 05 2024 15:32:50 EDT Ventricular Rate:  85 PR Interval:  170 QRS Duration:  74 QT Interval:  366 QTC Calculation: 435 R Axis:   43  Text Interpretation: Normal sinus rhythm Normal ECG When compared with ECG of 28-Feb-2023 14:55, Sinus rhythm has replaced Atrial fibrillation Vent. rate has decreased BY  93 BPM ST elevation has replaced ST depression in Inferior leads ST no longer depressed in Lateral leads T wave inversion no longer evident in Inferior leads Confirmed by Perla Lye [phone]) on 07/05/2024 3:51:16 PM   Past medical history reviewed event monitor May 2020 No atrial fib triggered events were not associated with significant arrhythmia Rare short runs of atrial tachycardia  Husband passed from cancer, colon,  March 13, 2019 hospitalization for atrial fibrillation sitting on her couch at rest watching television  acute onset of tachycardia with some chest tightness shortness of breath.  She does have propranolol  but did not think to take this Last episode of atrial fibrillation December 2018 after taking Sudafed and other cough medicine   heart rates in the low 200 range Started on diltiazem  infusion Was kept in the hospital for pulmonary edema, started on Lasix, diltiazem  for rate control  Converted back to normal sinus rhythm on diltiazem  chads vasc 2 (female and hypertension) She prefered not to be on anticoagulation  -Risk factors include obesity, possible  sleep apnea, hypertension  LVH on echocardiogram possibly secondary to longstanding hypertension  Recent emergency room visits as detailed below ER March 27, 2018 for chest pain atypical ER 02/28/2018 for fall, knee pain ER 02/11/2018 for arm pain ER 02/08/2018 for palpitations and shortness of  breath. ER 2/8/209 with HTN ER 01/02/2018 for nausea ER 12/23/2017 for chest pain, atypical ER 12/21/2017 for SOB, was tearful ER 12/06/2017 for palplitations, , [facility] 12/01/2017 for atrial fib with RVR, seen by dr. Fernand Heart rate was 190 bpm, developed after taking cold medication Started on sotolol, Noac As an outpatient had fatigue, did not feel comfortable taking anticoagulation   CT scan chest reviewed  from 2018 showing no coronary calcification or aortic atherosclerosis  Sleep test  mild sleep apnea She declined CPAP Stress test through [facility]   Past Medical History:  Diagnosis Date   Abnormal thyroid  function test 05/16/2016   Anxiety    Atrial fibrillation (HCC)    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 05/16/2016   Congestive heart failure, unspecified HF chronicity, unspecified heart failure type (HCC) 03/19/2021   Decreased libido 05/16/2016   Dysrhythmia    Gastroesophageal reflux disease without esophagitis 07/17/2020   Hyperlipidemia 07/17/2020   Hypertension    Hypokalemia    Impaired fasting glucose    Morbid obesity (HCC) 06/01/2015   Morbid obesity (HCC)    Plantar fasciitis    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   Past Surgical History:  Procedure Laterality Date   BREAST BIOPSY Right 2015   benign   COLONOSCOPY WITH PROPOFOL  N/A 02/05/2023   Procedure: COLONOSCOPY WITH PROPOFOL ;  Surgeon: Unk Corinn Skiff, MD;  Location: [facility] ENDOSCOPY;  Service: Gastroenterology;  Laterality: N/A;   ESOPHAGOGASTRODUODENOSCOPY (EGD) WITH PROPOFOL  N/A 08/12/2018   Procedure: ESOPHAGOGASTRODUODENOSCOPY (EGD) WITH PROPOFOL ;  Surgeon: Toledo, Ladell POUR, MD;  Location: [facility] ENDOSCOPY;  Service: Gastroenterology;  Laterality: N/A;   HAMMER TOE SURGERY       Current Meds  Medication Sig   albuterol  (VENTOLIN  HFA) 108 (90 Base) MCG/ACT inhaler Inhale 2 puffs into the lungs every 6 (six) hours as needed for wheezing or shortness of breath.   cetirizine  (ZYRTEC ) 10 MG tablet Take 1 tablet (10 mg  total) by mouth daily.   diltiazem  (CARDIZEM ) 30 MG tablet Take 1 tablet (30 mg total) by mouth 3 (three) times daily.   Fluticasone -Umeclidin-Vilant (TRELEGY ELLIPTA ) 100-62.5-25 MCG/ACT AEPB Inhale 1 puff into the lungs daily.   ipratropium-albuterol  (DUONEB) 0.5-2.5 (3) MG/3ML SOLN Take 3 mLs by nebulization 3 (three) times daily as needed.   propranolol  (INDERAL ) 20 MG tablet Take 1 tablet (20 mg total) by mouth 3 (three) times daily as needed.     Allergies:   Patient has no known allergies.   Social History   Tobacco Use   Smoking status: Never   Smokeless tobacco: Never  Vaping Use   Vaping status: Never Used  Substance Use Topics   Alcohol use: Not Currently    Alcohol/week: 0.0 standard drinks of alcohol    Comment: occ   Drug use: No     Current Outpatient Medications on File Prior to Visit  Medication Sig Dispense Refill   albuterol  (VENTOLIN  HFA) 108 (90 Base) MCG/ACT inhaler Inhale 2 puffs into the lungs every 6 (six) hours as needed for wheezing or shortness of breath. 8 g 0   cetirizine  (ZYRTEC ) 10 MG tablet Take 1 tablet (10 mg total) by mouth daily. 30 tablet 0   diltiazem  (CARDIZEM ) 30 MG tablet  Take 1 tablet (30 mg total) by mouth 3 (three) times daily. 60 tablet 1   Fluticasone -Umeclidin-Vilant (TRELEGY ELLIPTA ) 100-62.5-25 MCG/ACT AEPB Inhale 1 puff into the lungs daily. 28 each 2   ipratropium-albuterol  (DUONEB) 0.5-2.5 (3) MG/3ML SOLN Take 3 mLs by nebulization 3 (three) times daily as needed. 90 mL 0   propranolol  (INDERAL ) 20 MG tablet Take 1 tablet (20 mg total) by mouth 3 (three) times daily as needed. 60 tablet 1   benzonatate  (TESSALON ) 100 MG capsule Take 1-2 capsules (100-200 mg total) by mouth 3 (three) times daily as needed. (Patient not taking: Reported on 07/05/2024) 60 capsule 0   benzonatate  (TESSALON ) 100 MG capsule Take 2 capsules (200 mg total) by mouth 2 (two) times daily as needed for cough. (Patient not taking: Reported on 07/05/2024) 20  capsule 0   budesonide -formoterol  (SYMBICORT ) 160-4.5 MCG/ACT inhaler Inhale 2 puffs into the lungs in the morning and at bedtime. (Patient not taking: Reported on 07/05/2024) 1 each 12   chlorpheniramine-HYDROcodone (TUSSIONEX) 10-8 MG/5ML Take 5 mLs by mouth every 12 (twelve) hours as needed for cough (cough, will cause drowsiness.). (Patient not taking: Reported on 07/05/2024) 120 mL 0   EQ ASPIRIN  LOW DOSE 81 MG chewable tablet  (Patient not taking: Reported on 07/05/2024)     fluticasone  (FLONASE ) 50 MCG/ACT nasal spray Place 2 sprays into both nostrils daily. (Patient not taking: Reported on 07/05/2024) 16 g 0   metroNIDAZOLE  (FLAGYL ) 500 MG tablet Take 1 tablet (500 mg total) by mouth 2 (two) times daily. (Patient not taking: Reported on 07/05/2024) 14 tablet 0   pantoprazole  (PROTONIX ) 40 MG tablet Take 1 tablet (40 mg total) by mouth daily. (Patient not taking: Reported on 07/05/2024) 30 tablet 2   No current facility-administered medications on file prior to visit.     Family Hx: The patient's family history includes Arthritis in her mother; Depression in her mother; Heart disease in her mother; Hypertension in her mother; Stroke in her maternal grandmother. There is no history of Breast cancer.  ROS:   Please see the history of present illness.    Review of Systems  Constitutional: Negative.   Respiratory: Negative.    Cardiovascular: Negative.   Gastrointestinal: Negative.   Musculoskeletal: Negative.   Neurological: Negative.   Psychiatric/Behavioral:  The patient is nervous/anxious.   All other systems reviewed and are negative.    Labs/Other Tests and Data Reviewed:    Recent Labs: No results found for requested labs within last 365 days.   Recent Lipid Panel Lab Results  Component Value Date/Time   CHOL 173 12/04/2022 03:55 PM   CHOL 177 07/07/2017 03:43 PM   CHOL 165 03/09/2014 05:29 PM   TRIG 92 12/04/2022 03:55 PM   TRIG 96 03/09/2014 05:29 PM   HDL 54 12/04/2022  03:55 PM   HDL 49 07/07/2017 03:43 PM   HDL 60 03/09/2014 05:29 PM   CHOLHDL 3.2 12/04/2022 03:55 PM   LDLCALC 100 (H) 12/04/2022 03:55 PM   LDLCALC 86 03/09/2014 05:29 PM    Wt Readings from Last 3 Encounters:  07/05/24 270 lb 8 oz (122.7 kg)  04/21/24 270 lb 1.6 oz (122.5 kg)  10/14/23 271 lb (122.9 kg)    Exam:    Vital Signs: Vital signs may also be detailed in the HPI BP 130/78 (BP Location: Left Arm, Patient Position: Sitting, Cuff Size: Large)   Pulse 85   Ht 5' 6 (1.676 m)   Wt 270 lb 8 oz (122.7  kg)   LMP 04/15/2020 (Within Days) Comment: Dec 2-5, 2021 spotting really light  SpO2 97%   BMI 43.66 kg/m   Constitutional:  oriented to person, place, and time. No distress.  HENT:  Head: Grossly normal Eyes:  no discharge. No scleral icterus.  Neck: No JVD, no carotid bruits  Cardiovascular: Regular rate and rhythm, no murmurs appreciated Pulmonary/Chest: Clear to auscultation bilaterally, no wheezes or rails Abdominal: Soft.  no distension.  no tenderness.  Musculoskeletal: Normal range of motion Neurological:  normal muscle tone. Coordination normal. No atrophy Skin: Skin warm and dry Psychiatric: normal affect, pleasant  ASSESSMENT & PLAN:    Paroxysmal atrial fibrillation Several episodes of atrial fibrillation with RVR Most recent episode last year, broke with diltiazem  IV Recommend she continue to take propranolol  and diltiazem  as needed for breakthrough arrhythmia - For more frequent episodes of atrial fibrillation would need to start diltiazem  ER 120 daily with propranolol  as needed - She reports she is not on anticoagulation  Essential hypertension  LVH on echocardiogram Blood pressure is well controlled on today's visit. No changes made to the medications.  Chest pain Atypical chest pain, likely chest wall, musculoskeletal No ischemic work-up at this time  Adjustment disorder Prior loss of husband, metastatic cancer Stress at  work   Signed, Evalene Lunger, MD  07/05/2024 3:49 PM    [facility] [address], [place], [place] [address]

## 2024-07-05 NOTE — Telephone Encounter (Signed)
 Called patient after checking open slot with Gollan, patient is available to come into office today

## 2024-07-05 NOTE — Patient Instructions (Signed)

## 2024-07-05 NOTE — Telephone Encounter (Signed)
 Pt c/o Shortness Of Breath: STAT if SOB developed within the last 24 hours or pt is noticeably SOB on the phone  1. Are you currently SOB (can you hear that pt is SOB on the phone)? no  2. How long have you been experiencing SOB? Patient states on and off for the past week.  3. Are you SOB when sitting or when up moving around? Both, but mainly when she is moving around.   4. Are you currently experiencing any other symptoms? States had chest pain with the SOB on Saturday, it took her breath away.  She said she took diltiazem  one tablet and the pain went away.  She said she just takes her diltiazem  as needed.  She said she has been feeling more tired than normal.  Patient stated she does have asthma and sometimes she does attribute her SOB to that, but this time it just feels different.

## 2024-07-09 NOTE — Telephone Encounter (Signed)
 Patient seen in clinic 07/05/24 all questions and concerns addressed at that time.

## 2024-07-30 ENCOUNTER — Ambulatory Visit
Admission: RE | Admit: 2024-07-30 | Discharge: 2024-07-30 | Disposition: A | Discharge status: Home / Self Care | Source: Ambulatory Visit | Attending: Nurse Practitioner | Admitting: Nurse Practitioner

## 2024-07-30 DIAGNOSIS — Z1231 Encounter for screening mammogram for malignant neoplasm of breast: Secondary | ICD-10-CM | POA: Diagnosis not present

## 2024-11-22 ENCOUNTER — Other Ambulatory Visit (HOSPITAL_COMMUNITY)
Admission: RE | Admit: 2024-11-22 | Discharge: 2024-11-22 | Disposition: A | Discharge status: Home / Self Care | Source: Ambulatory Visit | Attending: Nurse Practitioner | Admitting: Nurse Practitioner

## 2024-11-22 ENCOUNTER — Encounter: Payer: Self-pay | Admitting: Nurse Practitioner

## 2024-11-22 ENCOUNTER — Ambulatory Visit: Admitting: Nurse Practitioner

## 2024-11-22 VITALS — BP 135/84 | HR 70 | Temp 98.4°F | Ht 66.0 in | Wt 268.0 lb

## 2024-11-22 DIAGNOSIS — Z124 Encounter for screening for malignant neoplasm of cervix: Secondary | ICD-10-CM

## 2024-11-22 DIAGNOSIS — E66813 Obesity, class 3: Secondary | ICD-10-CM | POA: Diagnosis not present

## 2024-11-22 DIAGNOSIS — E785 Hyperlipidemia, unspecified: Secondary | ICD-10-CM | POA: Diagnosis not present

## 2024-11-22 DIAGNOSIS — I10 Essential (primary) hypertension: Secondary | ICD-10-CM

## 2024-11-22 DIAGNOSIS — D219 Benign neoplasm of connective and other soft tissue, unspecified: Secondary | ICD-10-CM

## 2024-11-22 DIAGNOSIS — Z23 Encounter for immunization: Secondary | ICD-10-CM | POA: Diagnosis not present

## 2024-11-22 DIAGNOSIS — Z0001 Encounter for general adult medical examination with abnormal findings: Secondary | ICD-10-CM

## 2024-11-22 DIAGNOSIS — Z6841 Body Mass Index (BMI) 40.0 and over, adult: Secondary | ICD-10-CM | POA: Diagnosis not present

## 2024-11-22 DIAGNOSIS — I509 Heart failure, unspecified: Secondary | ICD-10-CM | POA: Diagnosis not present

## 2024-11-22 DIAGNOSIS — Z0000 Encounter for general adult medical examination without abnormal findings: Secondary | ICD-10-CM | POA: Diagnosis not present

## 2024-11-22 DIAGNOSIS — Z780 Asymptomatic menopausal state: Secondary | ICD-10-CM | POA: Insufficient documentation

## 2024-11-22 MED ORDER — ZEPBOUND 2.5 MG/0.5ML ~~LOC~~ SOAJ: 2.5000 mg | SUBCUTANEOUS | 0 refills | Status: AC

## 2024-11-22 NOTE — Patient Instructions (Signed)
 Healthy Weight Loss Guide ?? Weight Loss Goal - Aim for 1-2 pounds per week - Target: 5-10% of your starting body weight over 3-6 months ??? Nutrition Tips - Eat 3 meals per day and avoid skipping meals - Fill half your plate with vegetables, a quarter with protein, a quarter with whole grains - Choose lean proteins: chicken, fish, eggs, tofu, beans - Limit: - Sugary drinks (soda, sweet tea, juice) - Fried foods and fast food - Processed snacks (chips, candy, cookies) - Drink at least 64 oz of water per day - Practice portion control and mindful eating ???? Lifestyle Habits - Track what you eat (apps like MyFitnessPal, Lose It!, or a paper log) - Get 7-9 hours of sleep per night - Manage stress (meditation, breathing exercises, counseling if needed) - Limit alcohol (empty calories and may increase hunger) ???? Exercise Recommendations - Goal: 150 minutes per week of moderate activity (e.g., brisk walking, cycling) - Start with 10-15 minutes/day and build up gradually - Add 2 days per week of strength training (light weights, resistance bands, or bodyweight) ?? Remember: Progress > Perfection Small changes every day add up. Don't give up! - Avoid high-fat or greasy foods to reduce nausea - Focus on protein at each meal to preserve muscle mass - Stay well hydrated (at least 64 oz water per day) - Limit sugar and processed carbohydrates ?? Managing Side Effects if on weight loss medication - Eat slowly and stop eating when you feel full - Use anti-nausea strategies: ginger tea, peppermint, crackers - Talk to your provider about adjusting the dose if needed - Stool softeners or fiber supplements can help with constipation ?? Staying on Track - Track weight and non-scale victories (energy, clothing fit, labs) - Follow up with your provider regularly - Don't stop medication without medical guidance - Combine medication with healthy habits for best results ?? Remember Weight loss  medications are a tool, not a shortcut. Healthy habits matter. Be patient and consistent--small changes lead to big results.

## 2024-11-22 NOTE — Progress Notes (Signed)
 Name: Lori Oliver   MRN: [id number]    DOB: Aug 01, 1971   Date:11/22/2024       Progress Note  Subjective  Chief Complaint  Chief Complaint  Patient presents with   Annual Exam    Pt would like to discuss weight loss    HPI  Patient presents for annual CPE. Discussed the use of AI scribe software for clinical note transcription with the patient, who gave verbal consent to proceed.  History of Present Illness Lori Oliver is a 53 year old female who presents for an annual physical exam and to discuss weight management.  Obesity and weight management - Current weight is 268 pounds, decreased from 270 pounds in July 2025. - Dietary habits include frequent eating out due to busy work schedule; attempts to include vegetables and meat when cooking at home. - Physical activity consists of exercise about two days per week; recognizes need for increased consistency. - Works two jobs involving significant walking, but activity does not elevate heart rate sufficiently for cardiovascular benefit. - Previously used phentermine  for weight loss with beneficial effect; interested in exploring current weight loss medications, especially those covered by insurance.  Gynecologic symptoms and fibroids - History of uterine fibroids causing pain, particularly at night. - Postmenopausal status. - No Pap smear since 2020; desires screening due to ongoing pain and concerns about fibroids. - Not currently sexually active.  Cardiovascular disease - History of hypertension, atrial fibrillation, and congestive heart failure.  Respiratory disease - History of asthma.  Gastrointestinal symptoms - History of gastroesophageal reflux disease (GERD).  Hyperlipidemia - History of hyperlipidemia.  Sleep patterns - Obtains 7-8 hours of sleep per night.  Cancer screening - Colorectal cancer screening up to date as of February 2024.  Hepatitis c screening - Reactive hepatitis C antibody test  in 2022; HCV RNA negative.  Mental health - Depression screening negative.  Genitourinary symptoms - No issues with urinary incontinence.  Safety and social environment - No concerns regarding violence at home.  Preventive care - Last dental exam earlier in 2025 (exact date unknown). - Last eye exam at the beginning of 2025.    Diet: tries to eat well balanced,  eats a lot of convenience food Exercise: 2 days a week,  walks a lot,  recommend 150 min of physical activity weekly    Sleep: 8 hours Last dental exam:earlier this year Last eye exam: beginning of the year  Flowsheet Row Office Visit from 11/22/2024 in [facility]  AUDIT-C Score 0   Depression: Phq 9 is  negative    11/22/2024    8:57 AM 10/14/2023    2:51 PM 08/26/2023    3:41 PM 05/05/2023    3:48 PM 03/31/2023    2:56 PM  Depression screen PHQ 2/9  Decreased Interest 0 0 0 0 0  Down, Depressed, Hopeless 0 0 0 0 0  PHQ - 2 Score 0 0 0 0 0  Altered sleeping  0 0 0 0  Tired, decreased energy  0 0 0 0  Change in appetite  0 0 0 0  Feeling bad or failure about yourself   0 0 0 0  Trouble concentrating  0 0 0 0  Moving slowly or fidgety/restless  0 0 0 0  Suicidal thoughts  0 0 0 0  PHQ-9 Score  0  0  0  0   Difficult doing work/chores  Not difficult at all Not difficult at all  Not difficult at all Not difficult at all     Data saved with a previous flowsheet row definition   Hypertension: BP Readings from Last 3 Encounters:  11/22/24 135/84  07/05/24 130/78  04/21/24 132/78   Obesity: Wt Readings from Last 3 Encounters:  11/22/24 268 lb (121.6 kg)  07/05/24 270 lb 8 oz (122.7 kg)  04/21/24 270 lb 1.6 oz (122.5 kg)   BMI Readings from Last 3 Encounters:  11/22/24 43.26 kg/m  07/05/24 43.66 kg/m  04/21/24 43.60 kg/m     Vaccines:  HPV: up to at age 85 , ask insurance if age between 42-45  Shingrix: 85-64 yo and ask insurance if covered when patient above 84  yo Pneumonia:  educated and discussed with patient. Flu:  educated and discussed with patient.  Hep C Screening: completed STD testing and prevention (HIV/chl/gon/syphilis): completed Intimate partner violence:none Sexual History : not currently sexually active Menstrual History/LMP/Abnormal Bleeding: postmenopausal  Incontinence Symptoms: none  Breast cancer:  - Last Mammogram: 07/30/2024 - BRCA gene screening: none  Osteoporosis: Discussed high calcium and vitamin D  supplementation, weight bearing exercises  Cervical cancer screening: 12/06/2019, completed today, will refer to GYN, hx of fibroids  Skin cancer: Discussed monitoring for atypical lesions  Colorectal cancer: up to date 02/05/2023   Lung cancer:   Low Dose CT Chest recommended if Age 56-80 years, 20 pack-year currently smoking OR have quit w/in 15years. Patient does not qualify.   ECG: 07/05/2024  Advanced Care Planning: A voluntary discussion about advance care planning including the explanation and discussion of advance directives.  Discussed health care proxy and Living will, and the patient was able to identify a health care proxy as daughter.  Patient does not have a living will at present time. If patient does have living will, I have requested they bring this to the clinic to be scanned in to their chart.  Lipids: Lab Results  Component Value Date   CHOL 173 12/04/2022   CHOL 148 03/19/2021   CHOL 164 12/06/2019   Lab Results  Component Value Date   HDL 54 12/04/2022   HDL 45 (L) 03/19/2021   HDL 47 (L) 12/06/2019   Lab Results  Component Value Date   LDLCALC 100 (H) 12/04/2022   LDLCALC 88 03/19/2021   LDLCALC 97 12/06/2019   Lab Results  Component Value Date   TRIG 92 12/04/2022   TRIG 66 03/19/2021   TRIG 108 12/06/2019   Lab Results  Component Value Date   CHOLHDL 3.2 12/04/2022   CHOLHDL 3.3 03/19/2021   CHOLHDL 3.5 12/06/2019   No results found for: LDLDIRECT  Glucose: Glucose   Date Value Ref Range Status  03/09/2014 86 65 - 99 mg/dL Final   Glucose, Bld  Date Value Ref Range Status  03/02/2023 107 (H) 70 - 99 mg/dL Final    Comment:    Glucose reference range applies only to samples taken after fasting for at least 8 hours.  03/01/2023 110 (H) 70 - 99 mg/dL Final    Comment:    Glucose reference range applies only to samples taken after fasting for at least 8 hours.  02/28/2023 124 (H) 70 - 99 mg/dL Final    Comment:    Glucose reference range applies only to samples taken after fasting for at least 8 hours.   Glucose-Capillary  Date Value Ref Range Status  03/14/2019 116 (H) 70 - 99 mg/dL Final  87/82/7981 873 (H) 65 - 99 mg/dL Final  Patient Active Problem List   Diagnosis Date Noted   Moderate persistent asthma with acute exacerbation 04/21/2024   Upper airway cough syndrome 02/21/2022   Shortness of breath 10/22/2021   Congestive heart failure, unspecified HF chronicity, unspecified heart failure type (HCC) 03/19/2021   Nexplanon  in place 01/03/2021   Prediabetes 07/17/2020   Hyperlipidemia 07/17/2020   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   Gastroesophageal reflux disease without esophagitis 07/17/2020   Uterine leiomyoma 07/17/2020   Atrial tachycardia 05/17/2019   Chest tightness 05/17/2019   Intermittent atrial fibrillation (HCC) 02/19/2018   Hypokalemia 02/10/2018   Anxiety 01/13/2018   Atrial fibrillation with RVR (HCC) 12/01/2017   Vitamin D  deficiency 07/08/2017   Lower back pain 07/07/2017   Screening for colon cancer 07/07/2017   Class 3 severe obesity due to excess calories with serious comorbidity and body mass index (BMI) of 40.0 to 44.9 in adult (HCC) 07/07/2017   Essential hypertension 07/11/2016   IFG (impaired fasting glucose) 05/16/2016   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 05/16/2016   Abnormal thyroid  function test 05/16/2016   Decreased libido 05/16/2016   Breast lump on left side at 5 o'clock position 05/16/2016   Bilateral  thoracic back pain 06/01/2015   History of abnormal cervical Pap smear 06/01/2015    Past Surgical History:  Procedure Laterality Date   BREAST BIOPSY Right 2015   benign   COLONOSCOPY WITH PROPOFOL  N/A 02/05/2023   Procedure: COLONOSCOPY WITH PROPOFOL ;  Surgeon: Unk Corinn Skiff, MD;  Location: [facility] ENDOSCOPY;  Service: Gastroenterology;  Laterality: N/A;   ESOPHAGOGASTRODUODENOSCOPY (EGD) WITH PROPOFOL  N/A 08/12/2018   Procedure: ESOPHAGOGASTRODUODENOSCOPY (EGD) WITH PROPOFOL ;  Surgeon: Toledo, Ladell POUR, MD;  Location: [facility] ENDOSCOPY;  Service: Gastroenterology;  Laterality: N/A;   HAMMER TOE SURGERY      Family History  Problem Relation Age of Onset   Hypertension Mother    Heart disease Mother    Arthritis Mother    Depression Mother    Stroke Maternal Grandmother    Breast cancer Neg Hx     Social History   Socioeconomic History   Marital status: Widowed    Spouse name: Jerel   Number of children: 1   Years of education: 12   Highest education level: Some college, no degree  Occupational History   Not on file  Tobacco Use   Smoking status: Never   Smokeless tobacco: Never  Vaping Use   Vaping status: Never Used  Substance and Sexual Activity   Alcohol use: Not Currently    Alcohol/week: 0.0 standard drinks of alcohol    Comment: occ   Drug use: No   Sexual activity: Yes    Partners: Male    Birth control/protection: Abstinence  Other Topics Concern   Not on file  Social History Narrative   Husband was diagnosed with Cancer this past Summer.   Social Drivers of Corporate Investment Banker Strain: Low Risk  (11/22/2024)   Overall Financial Resource Strain (CARDIA)    Difficulty of Paying Living Expenses: Not hard at all  Food Insecurity: No Food Insecurity (11/22/2024)   Hunger Vital Sign    Worried About Running Out of Food in the Last Year: Never true    Ran Out of Food in the Last Year: Never true  Transportation Needs: No Transportation Needs  (11/22/2024)   PRAPARE - Administrator, Civil Service (Medical): No    Lack of Transportation (Non-Medical): No  Physical Activity: Insufficiently Active (11/22/2024)   Exercise Vital Sign  Days of Exercise per Week: 2 days    Minutes of Exercise per Session: 50 min  Stress: No Stress Concern Present (11/22/2024)   [employer] of Occupational Health - Occupational Stress Questionnaire    Feeling of Stress: Not at all  Social Connections: Moderately Integrated (11/22/2024)   Social Connection and Isolation Panel    Frequency of Communication with Friends and Family: More than three times a week    Frequency of Social Gatherings with Friends and Family: More than three times a week    Attends Religious Services: More than 4 times per year    Active Member of [employer] or Organizations: Yes    Attends Banker Meetings: 1 to 4 times per year    Marital Status: Widowed  Intimate Partner Violence: Not At Risk (11/22/2024)   Humiliation, Afraid, Rape, and Kick questionnaire    Fear of Current or Ex-Partner: No    Emotionally Abused: No    Physically Abused: No    Sexually Abused: No     Current Outpatient Medications:    albuterol  (VENTOLIN  HFA) 108 (90 Base) MCG/ACT inhaler, Inhale 2 puffs into the lungs every 6 (six) hours as needed for wheezing or shortness of breath., Disp: 8 g, Rfl: 0   ipratropium-albuterol  (DUONEB) 0.5-2.5 (3) MG/3ML SOLN, Take 3 mLs by nebulization 3 (three) times daily as needed., Disp: 90 mL, Rfl: 0   propranolol  (INDERAL ) 20 MG tablet, Take 1 tablet (20 mg total) by mouth 3 (three) times daily as needed., Disp: 60 tablet, Rfl: 2   tirzepatide  (ZEPBOUND ) 2.5 MG/0.5ML Pen, Inject 2.5 mg into the skin once a week., Disp: 2 mL, Rfl: 0   cetirizine  (ZYRTEC ) 10 MG tablet, Take 1 tablet (10 mg total) by mouth daily., Disp: 30 tablet, Rfl: 0   diltiazem  (CARDIZEM ) 30 MG tablet, Take 1 tablet (30 mg total) by mouth 3 (three) times daily., Disp: 60  tablet, Rfl: 2   Fluticasone -Umeclidin-Vilant (TRELEGY ELLIPTA ) 100-62.5-25 MCG/ACT AEPB, Inhale 1 puff into the lungs daily., Disp: 28 each, Rfl: 2  No Known Allergies   ROS  Constitutional: Negative for fever or weight change.  Respiratory: Negative for cough and shortness of breath.   Cardiovascular: Negative for chest pain or palpitations.  Gastrointestinal: Negative for abdominal pain, no bowel changes.  Musculoskeletal: Negative for gait problem or joint swelling.  Skin: Negative for rash.  Neurological: Negative for dizziness or headache.  No other specific complaints in a complete review of systems (except as listed in HPI above).   Objective  Vitals:   11/22/24 0847  BP: 135/84  Pulse: 70  Temp: 98.4 F (36.9 C)  SpO2: 90%  Weight: 268 lb (121.6 kg)  Height: 5' 6 (1.676 m)    Body mass index is 43.26 kg/m.  Physical Exam Vitals reviewed. Exam conducted with a chaperone present.  Constitutional:      Appearance: Normal appearance.  HENT:     Head: Normocephalic.     Right Ear: Tympanic membrane normal.     Left Ear: Tympanic membrane normal.     Nose: Nose normal.  Eyes:     Extraocular Movements: Extraocular movements intact.     Conjunctiva/sclera: Conjunctivae normal.     Pupils: Pupils are equal, round, and reactive to light.  Neck:     Thyroid : No thyroid  mass, thyromegaly or thyroid  tenderness.  Cardiovascular:     Rate and Rhythm: Normal rate and regular rhythm.     Pulses: Normal pulses.  Heart sounds: Normal heart sounds.  Pulmonary:     Effort: Pulmonary effort is normal.     Breath sounds: Normal breath sounds.  Abdominal:     General: Bowel sounds are normal.     Palpations: Abdomen is soft.  Genitourinary:    Vagina: Normal.     Cervix: Normal.     Uterus: Normal.      Adnexa: Right adnexa normal and left adnexa normal.  Musculoskeletal:        General: Normal range of motion.     Cervical back: Normal range of motion and neck  supple.     Right lower leg: No edema.     Left lower leg: No edema.  Skin:    General: Skin is warm and dry.     Capillary Refill: Capillary refill takes less than 2 seconds.  Neurological:     General: No focal deficit present.     Mental Status: She is alert and oriented to person, place, and time. Mental status is at baseline.  Psychiatric:        Mood and Affect: Mood normal.        Behavior: Behavior normal.        Thought Content: Thought content normal.        Judgment: Judgment normal.     Fall Risk:    04/21/2024   11:41 AM 10/14/2023    2:51 PM 08/26/2023    3:40 PM 05/05/2023    3:48 PM 03/31/2023    2:56 PM  Fall Risk   Falls in the past year? 0 0 0 0 0  Number falls in past yr: 0 0 0 0 0  Injury with Fall? 0  0  0  0  0   Risk for fall due to :  No Fall Risks No Fall Risks No Fall Risks No Fall Risks  Follow up Falls evaluation completed Falls prevention discussed;Education provided;Falls evaluation completed Falls prevention discussed;Education provided;Falls evaluation completed Falls prevention discussed;Education provided;Falls evaluation completed Falls prevention discussed;Education provided;Falls evaluation completed     Data saved with a previous flowsheet row definition     Functional Status Survey: Is the patient deaf or have difficulty hearing?: No Does the patient have difficulty seeing, even when wearing glasses/contacts?: No Does the patient have difficulty concentrating, remembering, or making decisions?: No Does the patient have difficulty walking or climbing stairs?: No Does the patient have difficulty dressing or bathing?: No Does the patient have difficulty doing errands alone such as visiting a doctor's office or shopping?: No   Assessment & Plan  Problem List Items Addressed This Visit       Cardiovascular and Mediastinum   Essential hypertension   Relevant Orders   CBC with Differential/Platelet   Comprehensive metabolic panel with  GFR   Congestive heart failure, unspecified HF chronicity, unspecified heart failure type (HCC)     Other   Class 3 severe obesity due to excess calories with serious comorbidity and body mass index (BMI) of 40.0 to 44.9 in adult [facility])   Relevant Medications   tirzepatide  (ZEPBOUND ) 2.5 MG/0.5ML Pen   Other Relevant Orders   TSH   Hyperlipidemia   Relevant Orders   Comprehensive metabolic panel with GFR   Lipid panel   Other Visit Diagnoses       Annual physical exam    -  Primary   Relevant Orders   CBC with Differential/Platelet   Comprehensive metabolic panel with GFR   Hemoglobin A1c  Lipid panel   TSH   Cytology - PAP     Immunization due       Relevant Orders   Flu vaccine trivalent PF, 6mos and older(Flulaval,Afluria,Fluarix,Fluzone)     Fibroids       Relevant Orders   Ambulatory referral to Gynecology     Screening for cervical cancer       Relevant Orders   Cytology - PAP      Assessment and Plan Assessment & Plan Adult Wellness Visit Annual physical examination conducted. Blood pressure is well-controlled. Weight is 268 pounds, consistent with previous measurements. Diet is not well-balanced, and exercise is limited to two days a week. Sleep is adequate with 7-8 hours per night. No issues with incontinence or violence at home. Not sexually active. Up to date on mammogram and colon cancer screening. Last dental exam was earlier this year, and last eye exam was at the beginning of the year. No living will in place. - Encouraged a balanced diet and increased physical activity to 150 minutes per week. - Recommended strength training exercises twice a week. - Discussed the importance of a living will and encouraged its creation. - Encourage continuation of lifestyle modifications, including dietary management and regular exercise. -continue to increase physical activity, getting at least 150 min of physical activity a week.  Work on including runner, broadcasting/film/video 2 days  a week.  - continue eating at a calorie deficit 1600-1700 cal a day, eating a well balanced diet with whole foods, avoiding processed foods.   Patient is motivated to continue working on lifestyle modification.    Obesity, class 3 Class 3 obesity with a weight of 268 pounds. Discussed weight loss medications, specifically GLP-1 receptor agonists like Wegovy , which may aid in weight loss and offer cardiovascular and renal protection. Discussed potential side effects including nausea, vomiting, abdominal pain, constipation, and fatigue. Insurance coverage and cost considerations were discussed. Prefers Wegovy  due to its proven benefits for heart, kidney, and liver protection. - Submitted prescription for Wegovy  for weight loss. - Encouraged lifestyle modifications including diet and exercise.  Uterine fibroids with pelvic pain Reports pelvic pain, particularly at night, possibly related to uterine fibroids. Last Pap smear was in 2020. Referral to gynecologist is needed for further evaluation and management of fibroids. - Referred to gynecologist for evaluation and management of uterine fibroids. - Performed Pap smear during this visit.  Cervical cancer screening Due for cervical cancer screening. Last Pap smear was in 2020. Discussed the importance of regular screening, especially given the absence of HPV testing in previous records. - Performed Pap smear during this visit.  Immunization administration Discussed flu vaccination. 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 - Administered flu vaccine.     -USPSTF grade A and B recommendations reviewed with patient; age-appropriate recommendations, preventive care, screening tests, etc discussed and encouraged; healthy living encouraged; see AVS for patient education given to patient -Discussed importance of 150 minutes of physical activity weekly, eat two servings of fish weekly, eat one  serving of tree nuts ( cashews, pistachios, pecans, almonds.SABRA) every other day, eat 6 servings of fruit/vegetables daily and drink plenty of water and avoid sweet beverages.   -Reviewed Health Maintenance: yes

## 2024-11-23 LAB — CBC WITH DIFFERENTIAL/PLATELET
Absolute Lymphocytes: 3007 {cells}/uL (ref 850–3900)
Absolute Monocytes: 335 {cells}/uL (ref 200–950)
Basophils Absolute: 31 {cells}/uL (ref 0–200)
Basophils Relative: 0.5 %
Eosinophils Absolute: 291 {cells}/uL (ref 15–500)
Eosinophils Relative: 4.7 %
HCT: 40.6 % (ref 35.9–46.0)
Hemoglobin: 13.2 g/dL (ref 11.7–15.5)
MCH: 30.8 pg (ref 27.0–33.0)
MCHC: 32.5 g/dL (ref 31.6–35.4)
MCV: 94.6 fL (ref 81.4–101.7)
MPV: 11.6 fL (ref 7.5–12.5)
Monocytes Relative: 5.4 %
Neutro Abs: 2536 {cells}/uL (ref 1500–7800)
Neutrophils Relative %: 40.9 %
Platelets: 278 Thousand/uL (ref 140–400)
RBC: 4.29 Million/uL (ref 3.80–5.10)
RDW: 12.6 % (ref 11.0–15.0)
Total Lymphocyte: 48.5 %
WBC: 6.2 Thousand/uL (ref 3.8–10.8)

## 2024-11-23 LAB — COMPREHENSIVE METABOLIC PANEL WITH GFR
AG Ratio: 1.4 (calc) (ref 1.0–2.5)
ALT: 14 U/L (ref 6–29)
AST: 15 U/L (ref 10–35)
Albumin: 4.2 g/dL (ref 3.6–5.1)
Alkaline phosphatase (APISO): 79 U/L (ref 37–153)
BUN: 13 mg/dL (ref 7–25)
CO2: 30 mmol/L (ref 20–32)
Calcium: 9.2 mg/dL (ref 8.6–10.4)
Chloride: 102 mmol/L (ref 98–110)
Creat: 0.87 mg/dL (ref 0.50–1.03)
Globulin: 3.1 g/dL (ref 1.9–3.7)
Glucose, Bld: 99 mg/dL (ref 65–99)
Potassium: 4.2 mmol/L (ref 3.5–5.3)
Sodium: 139 mmol/L (ref 135–146)
Total Bilirubin: 0.4 mg/dL (ref 0.2–1.2)
Total Protein: 7.3 g/dL (ref 6.1–8.1)
eGFR: 80 mL/min/1.73m2 (ref >=60)

## 2024-11-23 LAB — CYTOLOGY - PAP
Adequacy: ABSENT
Chlamydia: NEGATIVE
Comment: NEGATIVE
Comment: NEGATIVE
Comment: NORMAL
Diagnosis: NEGATIVE
High risk HPV: NEGATIVE
Neisseria Gonorrhea: NEGATIVE

## 2024-11-23 LAB — TSH: TSH: 1.36 m[IU]/L

## 2024-11-23 LAB — LIPID PANEL
Cholesterol: 159 mg/dL (ref <200)
HDL: 52 mg/dL (ref >=50)
LDL Cholesterol (Calc): 92 mg/dL
Non-HDL Cholesterol (Calc): 107 mg/dL (ref <130)
Total CHOL/HDL Ratio: 3.1 (calc) (ref <5.0)
Triglycerides: 67 mg/dL (ref <150)

## 2024-11-23 LAB — HEMOGLOBIN A1C
Hgb A1c MFr Bld: 5.8 % — ABNORMAL HIGH (ref <5.7)
Mean Plasma Glucose: 120 mg/dL
eAG (mmol/L): 6.6 mmol/L

## 2024-11-24 ENCOUNTER — Ambulatory Visit: Payer: Self-pay | Admitting: Nurse Practitioner

## 2024-11-29 ENCOUNTER — Telehealth: Payer: Self-pay

## 2024-11-29 NOTE — Telephone Encounter (Signed)
 Copied from CRM #[id number]. Topic: Clinical - Medication Prior Auth >> Nov 29, 2024  2:06 PM Selinda RAMAN wrote: Reason for CRM: The patient called in to see if her Zepbound  had got a prior approval as she has been waiting for over a week. If that cannot be approved can a prescription for Phentermine  be called in if that can be approved and covered by her insurance?  [facility] [place], [place] - [address]  Phone: [phone] Fax: [phone]  Please assist patient further

## 2024-11-30 ENCOUNTER — Telehealth: Payer: Self-pay | Admitting: Pharmacy Technician

## 2024-11-30 ENCOUNTER — Other Ambulatory Visit (HOSPITAL_COMMUNITY): Payer: Self-pay

## 2024-11-30 NOTE — Telephone Encounter (Signed)
Called w/no answer. Lvm for callback

## 2024-11-30 NOTE — Telephone Encounter (Signed)
 Good morning Lori Oliver, her plan do not cover it's plan/benefit exclusion

## 2024-11-30 NOTE — Telephone Encounter (Signed)
 Pharmacy Patient Advocate Encounter   Received notification from Pt Calls Messages that prior authorization for Zepbound  2.5 mg/0.5ml pen is required/requested.   Insurance verification completed.   The patient is insured through [facility].   Per test claim: Per test claim, medication is not covered due to plan/benefit exclusion, PA not submitted at this time

## 2025-01-04 ENCOUNTER — Ambulatory Visit: Payer: Self-pay

## 2025-01-04 DIAGNOSIS — Z6841 Body Mass Index (BMI) 40.0 and over, adult: Secondary | ICD-10-CM

## 2025-01-04 NOTE — Telephone Encounter (Signed)
 FYI Only or Action Required?: Action required by provider: clinical question for provider.  Patient was last seen in primary care on 11/22/2024 by Gareth Mliss FALCON, FNP.  Called Nurse Triage reporting New Med Request.   Triage Disposition: Call PCP Within 24 Hours  Patient/caregiver understands and will follow disposition?: Yes        Copied from CRM #[id number]. Topic: Clinical - Medication Question >> Jan 04, 2025 11:25 AM Tiffany B wrote: Reason for CRM: Patient inquiring about her alternative medication. Please reference 11/29/2024 TE Reason for Disposition  [1] Caller requests to speak ONLY to PCP AND [2] NON-URGENT question  Answer Assessment - Initial Assessment Questions 1. REASON FOR CALL or QUESTION: What is your reason for calling today? or How can I best     Patient called into triage stating she was wanting a different weight loss medication prescribed. She was advised per the provider note on 11/30/24 , Due to her cardiac history I do not recommend phentermine . We can send her to [facility] program. Patient is interested but would like more information. Please advise.  Protocols used: PCP Call - No Triage-A-AH

## 2025-02-10 ENCOUNTER — Ambulatory Visit: Admitting: Obstetrics

## 2025-02-21 ENCOUNTER — Ambulatory Visit: Admitting: Nurse Practitioner

## 2025-11-23 ENCOUNTER — Encounter: Admitting: Nurse Practitioner
